# Patient Record
Sex: Female | Born: 1935 | Race: White | Hispanic: No | Smoking: Never smoker
Health system: Southern US, Community
[De-identification: ages and names within clinical notes are randomized; demographics above are authoritative.]

## PROBLEM LIST (undated history)

## (undated) DIAGNOSIS — F339 Major depressive disorder, recurrent, unspecified: Secondary | ICD-10-CM

## (undated) DIAGNOSIS — I8291 Chronic embolism and thrombosis of unspecified vein: Secondary | ICD-10-CM

## (undated) DIAGNOSIS — I10 Essential (primary) hypertension: Secondary | ICD-10-CM

## (undated) DIAGNOSIS — I69991 Dysphagia following unspecified cerebrovascular disease: Secondary | ICD-10-CM

## (undated) DIAGNOSIS — N1330 Unspecified hydronephrosis: Secondary | ICD-10-CM

## (undated) DIAGNOSIS — I639 Cerebral infarction, unspecified: Secondary | ICD-10-CM

## (undated) DIAGNOSIS — H3530 Unspecified macular degeneration: Secondary | ICD-10-CM

## (undated) DIAGNOSIS — D059 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: Secondary | ICD-10-CM

## (undated) DIAGNOSIS — I69920 Aphasia following unspecified cerebrovascular disease: Secondary | ICD-10-CM

## (undated) DIAGNOSIS — F329 Major depressive disorder, single episode, unspecified: Secondary | ICD-10-CM

## (undated) DIAGNOSIS — I629 Nontraumatic intracranial hemorrhage, unspecified: Secondary | ICD-10-CM

## (undated) DIAGNOSIS — F32A Depression, unspecified: Secondary | ICD-10-CM

## (undated) DIAGNOSIS — G9340 Encephalopathy, unspecified: Secondary | ICD-10-CM

## (undated) DIAGNOSIS — K760 Fatty (change of) liver, not elsewhere classified: Secondary | ICD-10-CM

## (undated) DIAGNOSIS — M792 Neuralgia and neuritis, unspecified: Secondary | ICD-10-CM

## (undated) DIAGNOSIS — G4700 Insomnia, unspecified: Secondary | ICD-10-CM

## (undated) HISTORY — DX: Depression, unspecified: F32.A

## (undated) HISTORY — DX: Neuralgia and neuritis, unspecified: M79.2

## (undated) HISTORY — DX: Major depressive disorder, single episode, unspecified: F32.9

## (undated) HISTORY — DX: Unspecified macular degeneration: H35.30

## (undated) HISTORY — DX: Dysphagia following unspecified cerebrovascular disease: I69.991

## (undated) HISTORY — PX: MASTECTOMY: SHX3

## (undated) HISTORY — DX: Major depressive disorder, recurrent, unspecified: F33.9

## (undated) HISTORY — PX: APPENDECTOMY: SHX54

## (undated) HISTORY — DX: Chronic embolism and thrombosis of unspecified vein: I82.91

## (undated) HISTORY — DX: Aphasia following unspecified cerebrovascular disease: I69.920

## (undated) HISTORY — PX: ABDOMINAL HYSTERECTOMY: SHX81

## (undated) HISTORY — PX: BREAST SURGERY: SHX581

## (undated) HISTORY — DX: Insomnia, unspecified: G47.00

## (undated) HISTORY — DX: Essential (primary) hypertension: I10

## (undated) HISTORY — DX: Nontraumatic intracranial hemorrhage, unspecified: I62.9

## (undated) HISTORY — DX: 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: D05.90

---

## 2012-03-24 ENCOUNTER — Non-Acute Institutional Stay (SKILLED_NURSING_FACILITY): Payer: Medicare Other | Admitting: Nurse Practitioner

## 2012-03-24 DIAGNOSIS — I6350 Cerebral infarction due to unspecified occlusion or stenosis of unspecified cerebral artery: Secondary | ICD-10-CM

## 2012-03-24 DIAGNOSIS — I10 Essential (primary) hypertension: Secondary | ICD-10-CM

## 2012-03-24 DIAGNOSIS — F3289 Other specified depressive episodes: Secondary | ICD-10-CM

## 2012-03-24 DIAGNOSIS — F329 Major depressive disorder, single episode, unspecified: Secondary | ICD-10-CM

## 2012-03-24 DIAGNOSIS — R130 Aphagia: Secondary | ICD-10-CM | POA: Insufficient documentation

## 2012-03-24 DIAGNOSIS — I82409 Acute embolism and thrombosis of unspecified deep veins of unspecified lower extremity: Secondary | ICD-10-CM

## 2012-03-24 DIAGNOSIS — G4700 Insomnia, unspecified: Secondary | ICD-10-CM | POA: Insufficient documentation

## 2012-03-24 DIAGNOSIS — M810 Age-related osteoporosis without current pathological fracture: Secondary | ICD-10-CM | POA: Insufficient documentation

## 2012-03-24 DIAGNOSIS — E785 Hyperlipidemia, unspecified: Secondary | ICD-10-CM

## 2012-03-24 DIAGNOSIS — F32A Depression, unspecified: Secondary | ICD-10-CM | POA: Insufficient documentation

## 2012-03-24 DIAGNOSIS — H3530 Unspecified macular degeneration: Secondary | ICD-10-CM

## 2012-03-24 DIAGNOSIS — R1310 Dysphagia, unspecified: Secondary | ICD-10-CM

## 2012-03-24 DIAGNOSIS — I639 Cerebral infarction, unspecified: Secondary | ICD-10-CM

## 2012-03-24 DIAGNOSIS — Z86718 Personal history of other venous thrombosis and embolism: Secondary | ICD-10-CM | POA: Insufficient documentation

## 2012-03-24 DIAGNOSIS — G47419 Narcolepsy without cataplexy: Secondary | ICD-10-CM

## 2012-03-24 DIAGNOSIS — R4701 Aphasia: Secondary | ICD-10-CM | POA: Insufficient documentation

## 2012-03-24 DIAGNOSIS — R339 Retention of urine, unspecified: Secondary | ICD-10-CM

## 2012-03-24 NOTE — Progress Notes (Signed)
Subjective:     Patient ID: Erica [name], female   DOB: 03/08/1927, 79 y.o.   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  Narcolepsy - Ritalin 5mg  BID Hyperlipidemia - Take lipitor 10mg  every night DVT - left lower leg, continue coumadin for total 3 months, onset 02/01/2012   Review of Systems  Constitutional: Positive for fatigue.  HENT: Positive for trouble swallowing and voice change. Negative for congestion and dental problem.   Respiratory: Positive for choking. Negative for wheezing.   Cardiovascular: Negative for leg swelling.  Gastrointestinal: Negative for nausea, constipation and abdominal distention.  Genitourinary: Positive for frequency and difficulty urinating.  Musculoskeletal: Positive for myalgias, back pain and gait problem.  Skin: Negative.   Neurological: Positive for speech difficulty and weakness. Negative for facial asymmetry.  Psychiatric/Behavioral: Positive for confusion and sleep disturbance.       Objective:   Physical Exam  Constitutional: She appears well-developed.  HENT:  Head: Normocephalic and atraumatic.  Left Ear: External ear normal.  Mouth/Throat: Oropharynx is clear and moist. No oropharyngeal exudate.  Eyes: Conjunctivae and EOM are normal. Pupils are equal, round, and reactive to light.  Neck: Normal range of motion. Neck supple. No JVD present. No thyromegaly present.  Cardiovascular: Normal rate, regular rhythm and normal heart sounds.  Exam reveals no friction rub.   No murmur heard. Pulmonary/Chest: Effort normal and breath sounds normal. No respiratory distress. She has no wheezes. She has no rales.  Abdominal: Soft. Bowel  sounds are normal. She exhibits no distension. There is no tenderness. There is no rebound and no guarding.  Genitourinary: Vagina normal.  Musculoskeletal: She exhibits no edema and no tenderness.       Right shoulder: She exhibits decreased strength.  Right sided paralysis with muscle strength 3/5 RUE and 2/5 ULE  Lymphadenopathy:    She has no cervical adenopathy.  Neurological: She is alert. She displays abnormal reflex. No cranial nerve deficit. She exhibits abnormal muscle tone. Coordination abnormal.  Skin: Skin is warm and dry. No rash noted.       Assessment:     Hypertenion - controlled Urinary Retention - stable CVA - onset 11/2011, residual left sided paralysis with dysphagia and aphasia Insomnia - managed  Depression - takes zoloft 75mg  daily and flat affact still present Narcolepsy - Not apparent to clinically Hyperlipidemia - controlled DVT - left lower leg, continue coumadin for total 3 months, onset 02/01/2012     Plan:     Hypertenion - continue linsinopril, check CMP Urinary Retention - continue urecholine and vesicare, check CBC CVA - onset 11/2011, supportive care in nursing home facility Insomnia - continue with mirtazapine 375 per night and Zaleplon 10mg  as PRN Depression - continue with zoloft 75mg  daily and flat affact presented at today's visit. Check TSH Narcolepsy - continue with Ritalin 5mg  BID for now Hyperlipidemia - continue with lipitor 10mg  every night DVT - left lower leg, continue coumadin for total 3 months, onset 02/01/2012

## 2012-04-06 LAB — HEPATIC FUNCTION PANEL
ALT: 13 U/L (ref 7–35)
AST: 16 U/L (ref 13–35)
Alkaline Phosphatase: 58 U/L (ref 25–125)

## 2012-04-06 LAB — BASIC METABOLIC PANEL
Creatinine: 0.8 mg/dL (ref 0.5–1.1)
Potassium: 2.9 mmol/L — AB (ref 3.4–5.3)
Potassium: 3.9 mmol/L (ref 3.4–5.3)
Sodium: 138 mmol/L (ref 137–147)

## 2012-04-06 LAB — CBC AND DIFFERENTIAL: Hemoglobin: 13.6 g/dL (ref 12.0–16.0)

## 2012-05-10 ENCOUNTER — Non-Acute Institutional Stay (SKILLED_NURSING_FACILITY): Payer: Medicare Other | Admitting: Nurse Practitioner

## 2012-05-10 DIAGNOSIS — R4701 Aphasia: Secondary | ICD-10-CM

## 2012-05-10 DIAGNOSIS — K140 Glossitis: Secondary | ICD-10-CM

## 2012-05-10 DIAGNOSIS — R635 Abnormal weight gain: Secondary | ICD-10-CM

## 2012-05-10 DIAGNOSIS — F329 Major depressive disorder, single episode, unspecified: Secondary | ICD-10-CM

## 2012-05-10 DIAGNOSIS — I82402 Acute embolism and thrombosis of unspecified deep veins of left lower extremity: Secondary | ICD-10-CM

## 2012-05-10 DIAGNOSIS — I10 Essential (primary) hypertension: Secondary | ICD-10-CM

## 2012-05-10 DIAGNOSIS — G47419 Narcolepsy without cataplexy: Secondary | ICD-10-CM

## 2012-05-10 DIAGNOSIS — R339 Retention of urine, unspecified: Secondary | ICD-10-CM

## 2012-05-10 DIAGNOSIS — G4700 Insomnia, unspecified: Secondary | ICD-10-CM

## 2012-05-10 DIAGNOSIS — I82409 Acute embolism and thrombosis of unspecified deep veins of unspecified lower extremity: Secondary | ICD-10-CM

## 2012-05-10 NOTE — Assessment & Plan Note (Signed)
#   14 Ibs weight gain in the past month--weekly weight to monitor.

## 2012-05-10 NOTE — Assessment & Plan Note (Signed)
Will stop Coumadin

## 2012-05-10 NOTE — Assessment & Plan Note (Signed)
Stable on Ritalin 5mg  bid.

## 2012-05-10 NOTE — Assessment & Plan Note (Signed)
Controlled on Lisinopril 5 mg daily  

## 2012-05-10 NOTE — Assessment & Plan Note (Signed)
Expressive aphasia--difficulty finding words related to hx of CVA

## 2012-05-10 NOTE — Assessment & Plan Note (Signed)
Erythema and sore front 1/3  and thick white coating over the posterior 2/3 of her tongue. Magic mouth wash 5cc ac and hs SS for 2 weeks.

## 2012-05-10 NOTE — Assessment & Plan Note (Signed)
Stable on Sertraline 75mg 

## 2012-05-10 NOTE — Assessment & Plan Note (Signed)
Stable on Urecholine 5mg tid   

## 2012-05-10 NOTE — Progress Notes (Signed)
Patient ID: Erica [name], female   DOB: 07-19-1927, 79 y.o.   MRN: [id number]  Chief Complaint:  Chief Complaint  Patient presents with  . Medical Managment of Chronic Issues    sore tongue, weight gain     HPI:    Problem List Items Addressed This Visit     ICD-9-CM   Essential hypertension, benign     Controlled on Lisinopril 5mg  daily    Urinary retention     Stable on Urecholine 5mg  tid    Depression     Stable on Sertraline 75mg     DVT (deep venous thrombosis)     Will stop Coumadin     Aphasia     Expressive aphasia--difficulty finding words related to hx of CVA    Insomnia     Still c/o difficulty falling asleep and early am awakes, but she expressed she feels rested next day. Takes Remeron 7.5mg  for rest and appetite and Zaleplon 10mg  prn at night.     Narcolepsy     Stable on Ritalin 5mg  bid.     Weight gain     # 14 Ibs weight gain in the past month--weekly weight to monitor.     Glossitis - Primary (Chronic)     Erythema and sore front 1/3  and thick white coating over the posterior 2/3 of her tongue. Magic mouth wash 5cc ac and hs SS for 2 weeks.        Review of Systems:  Review of Systems  Constitutional: Positive for weight loss (weight gain ). Negative for fever, chills, malaise/fatigue and diaphoresis.  HENT: Positive for hearing loss. Negative for congestion, sore throat, neck pain and ear discharge.        C/o sore tongue  Eyes: Negative for pain, discharge and redness.  Respiratory: Negative for cough, sputum production and wheezing.   Cardiovascular: Negative for chest pain, palpitations, orthopnea, claudication, leg swelling and PND.  Gastrointestinal: Negative for heartburn, nausea, vomiting, abdominal pain, diarrhea, constipation and blood in stool.  Genitourinary: Positive for frequency. Negative for dysuria, urgency, hematuria and flank pain.  Musculoskeletal: Negative for myalgias, back pain, joint pain and falls.  Skin: Negative for  itching and rash.  Neurological: Positive for speech change (expressive aphasia). Negative for dizziness, tingling, tremors, sensory change, focal weakness, seizures, loss of consciousness, weakness and headaches.  Endo/Heme/Allergies: Negative for environmental allergies and polydipsia. Does not bruise/bleed easily.  Psychiatric/Behavioral: Positive for memory loss. Negative for depression and hallucinations. The patient has insomnia. The patient is not nervous/anxious.      Medications: Patient's Medications  New Prescriptions   No medications on file  Previous Medications   ATORVASTATIN (LIPITOR) 10 MG TABLET    Take 10 mg by mouth daily.   BETA CAROTENE W/MINERALS (OCUVITE) TABLET    Take 1 tablet by mouth daily.   BETHANECHOL (URECHOLINE) 5 MG TABLET    Take 5 mg by mouth 3 (three) times daily.   CALCIUM CARBONATE (OS-CAL) 600 MG TABS    Take 600 mg by mouth 2 (two) times daily with a meal.   HYDROMORPHONE (DILAUDID) 2 MG TABLET    Take 2 mg by mouth every 6 (six) hours as needed for pain.   LISINOPRIL (PRINIVIL,ZESTRIL) 5 MG TABLET    Take 5 mg by mouth daily.   METHYLPHENIDATE (RITALIN) 5 MG TABLET    Take 5 mg by mouth 2 (two) times daily.   MIRTAZAPINE (REMERON) 7.5 MG TABLET    Take 7.5 mg by  mouth at bedtime and may repeat dose one time if needed.   SERTRALINE (ZOLOFT) 25 MG TABLET    Take 75 mg by mouth daily.   SOLIFENACIN (VESICARE) 5 MG TABLET    Take 10 mg by mouth 2 (two) times daily.   ZALEPLON (SONATA) 10 MG CAPSULE    Take 10 mg by mouth at bedtime as needed.  Modified Medications   No medications on file  Discontinued Medications   No medications on file     Physical Exam: Physical Exam  Constitutional: She appears well-developed.  HENT:  Head: Normocephalic and atraumatic.  Left Ear: External ear normal.  Mouth/Throat: Oropharynx is clear and moist. No oropharyngeal exudate.  Anterior 1/3 of tongue erythematous and posterior 2/3 of tongue covered with white  thick coating  Eyes: Conjunctivae and EOM are normal. Pupils are equal, round, and reactive to light.  Neck: Normal range of motion. Neck supple. No JVD present. No thyromegaly present.  Cardiovascular: Normal rate, regular rhythm and normal heart sounds.  Exam reveals no friction rub.   No murmur heard. Pulmonary/Chest: Effort normal and breath sounds normal. No respiratory distress. She has no wheezes. She has no rales.  Abdominal: Soft. Bowel sounds are normal. She exhibits no distension. There is no tenderness. There is no rebound and no guarding.  Genitourinary: Vagina normal.  Musculoskeletal: She exhibits no edema and no tenderness.       Right shoulder: She exhibits decreased strength.  Right sided paralysis with muscle strength 4/5  Lymphadenopathy:    She has no cervical adenopathy.  Neurological: She is alert. She displays abnormal reflex. No cranial nerve deficit. She exhibits abnormal muscle tone. Coordination abnormal.  Skin: Skin is warm and dry. No rash noted.  Psychiatric: Her mood appears not anxious. Her affect is not angry, not blunt, not labile and not inappropriate. Her speech is delayed and tangential. Her speech is not rapid and/or pressured and not slurred. She is slowed. She is not agitated, not aggressive, not hyperactive, not withdrawn, not actively hallucinating and not combative. Thought content is not paranoid and not delusional. Cognition and memory are impaired. She does not express impulsivity or inappropriate judgment. She does not exhibit a depressed mood. She is communicative. She exhibits abnormal recent memory. She exhibits normal remote memory. She is attentive.     Filed Vitals:   05/10/12 1406  BP: 110/62  Pulse: 72  Temp: 97.7 F (36.5 C)  TempSrc: Tympanic  Resp: 16      Labs reviewed: Basic Metabolic Panel:  Recent Labs  16/10/96  NA 138  K 2.9*  BUN 14  CREATININE 0.8  TSH 1.47    Liver Function Tests:  Recent Labs  04/06/12   AST 16  ALT 13  ALKPHOS 58    CBC:  Recent Labs  04/06/12  WBC 4.6  HGB 13.6  HCT 40  PLT 204    Anemia Panel: No results found for this basename: IRON, FOLATE, VITAMINB12,  in the last 8760 hours  Significant Diagnostic Results:     Assessment/Plan Glossitis Erythema and sore front 1/3  and thick white coating over the posterior 2/3 of her tongue. Magic mouth wash 5cc ac and hs SS for 2 weeks.   Aphasia Expressive aphasia--difficulty finding words related to hx of CVA  Insomnia Still c/o difficulty falling asleep and early am awakes, but she expressed she feels rested next day. Takes Remeron 7.5mg  for rest and appetite and Zaleplon 10mg  prn at night.  Narcolepsy Stable on Ritalin 5mg  bid.   Essential hypertension, benign Controlled on Lisinopril 5mg  daily  Depression Stable on Sertraline 75mg   DVT (deep venous thrombosis) Will stop Coumadin   Urinary retention Stable on Urecholine 5mg  tid  Weight gain # 14 Ibs weight gain in the past month--weekly weight to monitor.       Family/ staff Communication: monitor the patient' sore tongue   Goals of care: SNF   Labs/tests ordered none

## 2012-05-10 NOTE — Assessment & Plan Note (Addendum)
Still c/o difficulty falling asleep and early am awakes, but she expressed she feels rested next day. Takes Remeron 7.5mg  for rest and appetite and Zaleplon 10mg  prn at night.

## 2012-05-14 LAB — CBC AND DIFFERENTIAL: WBC: 4.9 10^3/mL

## 2012-06-14 ENCOUNTER — Non-Acute Institutional Stay (SKILLED_NURSING_FACILITY): Payer: Medicare Other | Admitting: Nurse Practitioner

## 2012-06-14 DIAGNOSIS — I10 Essential (primary) hypertension: Secondary | ICD-10-CM

## 2012-06-14 DIAGNOSIS — Z66 Do not resuscitate: Secondary | ICD-10-CM

## 2012-06-14 DIAGNOSIS — E785 Hyperlipidemia, unspecified: Secondary | ICD-10-CM

## 2012-06-14 DIAGNOSIS — I6350 Cerebral infarction due to unspecified occlusion or stenosis of unspecified cerebral artery: Secondary | ICD-10-CM

## 2012-06-14 DIAGNOSIS — I82402 Acute embolism and thrombosis of unspecified deep veins of left lower extremity: Secondary | ICD-10-CM

## 2012-06-14 DIAGNOSIS — N318 Other neuromuscular dysfunction of bladder: Secondary | ICD-10-CM

## 2012-06-14 DIAGNOSIS — R339 Retention of urine, unspecified: Secondary | ICD-10-CM

## 2012-06-14 DIAGNOSIS — I82409 Acute embolism and thrombosis of unspecified deep veins of unspecified lower extremity: Secondary | ICD-10-CM

## 2012-06-14 DIAGNOSIS — F329 Major depressive disorder, single episode, unspecified: Secondary | ICD-10-CM

## 2012-06-14 DIAGNOSIS — G4700 Insomnia, unspecified: Secondary | ICD-10-CM

## 2012-06-14 DIAGNOSIS — I639 Cerebral infarction, unspecified: Secondary | ICD-10-CM

## 2012-06-14 DIAGNOSIS — D6859 Other primary thrombophilia: Secondary | ICD-10-CM

## 2012-06-14 NOTE — Assessment & Plan Note (Signed)
Still c/o difficulty falling asleep and early am awakes, but she expressed she feels rested next day--better. Takes Remeron 7.5mg  for rest and appetite and Zaleplon 10mg  prn at night since Ritalin was decreased to 5mg  daily.

## 2012-06-14 NOTE — Assessment & Plan Note (Signed)
Controlled on Lisinopril 5 mg daily  

## 2012-06-14 NOTE — Progress Notes (Signed)
Patient ID: Erica [name], female   DOB: [birth year], 79 y.o.   MRN: [id number]  Chief Complaint:  Chief Complaint  Patient presents with  . 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 Left hand grip strength is weaker--cannot make a tight fist even if muscle strength is 5/5.     Insomnia     Still c/o difficulty falling asleep and early am awakes, but she expressed she feels rested next day--better. Takes Remeron 7.5mg  for rest and appetite and Zaleplon 10mg  prn at night since Ritalin was decreased to 5mg  daily.       Other and unspecified hyperlipidemia     Takes Lipitor 10mg      Protein C deficiency     Protein C functional 21(75-133) and total 60(72-160)--will be on long term--anticoagulation therapy--Xarelto 20mg  daily started 05/20/12    Protein S deficiency     Protein S functional 28(69-129), total 43(60-150)--Coumadin discontinued and Xarelto 20mg  started.     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        Review of Systems:  Review of Systems  Constitutional: Positive for weight loss (weight gain ). Negative for fever, chills, malaise/fatigue and diaphoresis.  HENT: Positive for hearing loss. Negative for congestion, sore throat, neck pain and ear discharge.        C/o sore tongue  Eyes: Negative for pain, discharge and redness.  Respiratory: Negative for cough, sputum production and wheezing.   Cardiovascular: Negative for chest pain, palpitations, orthopnea, claudication, leg swelling and PND.   Gastrointestinal: Negative for heartburn, nausea, vomiting, abdominal pain, diarrhea, constipation and blood in stool.  Genitourinary: Positive for frequency. Negative for dysuria, urgency, hematuria and flank pain.  Musculoskeletal: Negative for myalgias, back pain, joint pain and falls.  Skin: Negative for itching and rash.  Neurological: Positive for speech change (expressive aphasia). Negative for dizziness, tingling, tremors, sensory change, focal weakness, seizures, loss of consciousness, weakness and headaches.  Endo/Heme/Allergies: Negative for environmental allergies and polydipsia. Does not bruise/bleed easily.  Psychiatric/Behavioral: Positive for memory loss. Negative for depression and hallucinations. The patient is not nervous/anxious. Insomnia: better.      Medications: Patient's Medications  New Prescriptions   No medications on file  Previous Medications   ATORVASTATIN (LIPITOR) 10 MG TABLET    Take 10 mg by mouth daily.   BETA CAROTENE W/MINERALS (OCUVITE) TABLET    Take 1 tablet by mouth daily.   BETHANECHOL (URECHOLINE) 5 MG TABLET    Take 5 mg by mouth 3 (three) times daily.   CALCIUM CARBONATE (OS-CAL) 600 MG TABS    Take 600 mg by mouth 2 (two) times daily with a meal.   HYDROMORPHONE (DILAUDID) 2 MG TABLET    Take 2 mg by mouth every 6 (six) hours as needed for pain.   LISINOPRIL (PRINIVIL,ZESTRIL) 5 MG TABLET    Take 5 mg by mouth daily.  METHYLPHENIDATE (RITALIN) 5 MG TABLET    Take 5 mg by mouth 2 (two) times daily.   MIRTAZAPINE (REMERON) 7.5 MG TABLET    Take 7.5 mg by mouth at bedtime and may repeat dose one time if needed.   SERTRALINE (ZOLOFT) 25 MG TABLET    Take 75 mg by mouth daily.   SOLIFENACIN (VESICARE) 5 MG TABLET    Take 10 mg by mouth 2 (two) times daily.   ZALEPLON (SONATA) 10 MG CAPSULE    Take 10 mg by mouth at bedtime as needed.  Modified Medications   No medications on file  Discontinued Medications   No medications on file      Physical Exam: Physical Exam  Constitutional: She appears well-developed.  HENT:  Head: Normocephalic and atraumatic.  Left Ear: External ear normal.  Mouth/Throat: Oropharynx is clear and moist. No oropharyngeal exudate.  Anterior 1/3 of tongue erythematous and posterior 2/3 of tongue covered with white thick coating  Eyes: Conjunctivae and EOM are normal. Pupils are equal, round, and reactive to light.  Neck: Normal range of motion. Neck supple. No JVD present. No thyromegaly present.  Cardiovascular: Normal rate, regular rhythm and normal heart sounds.  Exam reveals no friction rub.   No murmur heard. Pulmonary/Chest: Effort normal and breath sounds normal. No respiratory distress. She has no wheezes. She has no rales.  Abdominal: Soft. Bowel sounds are normal. She exhibits no distension. There is no tenderness. There is no rebound and no guarding.  Genitourinary: Vagina normal.  Musculoskeletal: She exhibits no edema and no tenderness.       Right shoulder: She exhibits decreased strength.  Right sided paralysis with muscle strength 4/5  Lymphadenopathy:    She has no cervical adenopathy.  Neurological: She is alert. She displays abnormal reflex. No cranial nerve deficit. She exhibits abnormal muscle tone. Coordination abnormal.  Skin: Skin is warm and dry. No rash noted.  Psychiatric: Her mood appears not anxious. Her affect is not angry, not blunt, not labile and not inappropriate. Her speech is delayed and tangential. Her speech is not rapid and/or pressured and not slurred. She is slowed. She is not agitated, not aggressive, not hyperactive, not withdrawn, not actively hallucinating and not combative. Thought content is not paranoid and not delusional. Cognition and memory are impaired. She does not express impulsivity or inappropriate judgment. She does not exhibit a depressed mood. She is communicative. She exhibits abnormal recent memory. She exhibits normal remote memory.  She is attentive.     Filed Vitals:   06/14/12 1626  BP: 138/68  Pulse: 72  Temp: 97.3 F (36.3 C)  TempSrc: Tympanic  Resp: 18      Labs reviewed: Basic Metabolic Panel:  Recent Labs  16/10/96  NA 138  K 2.9*  BUN 14  CREATININE 0.8  TSH 1.47    Liver Function Tests:  Recent Labs  04/06/12  AST 16  ALT 13  ALKPHOS 58    CBC:  Recent Labs  04/06/12 05/14/12  WBC 4.6 4.9  HGB 13.6 13.9  HCT 40 41  PLT 204 210    Anemia Panel: No results found for this basename: IRON, FOLATE, VITAMINB12,  in the last 8760 hours  Significant Diagnostic Results:     Assessment/Plan CVA (cerebral vascular accident) Left parietal and occipital hemorrhagic stroke 11/2011. Left hand grip strength is weaker--cannot make a tight fist even if muscle strength is 5/5.   Protein C deficiency Protein C functional [phone]) and total  60(72-160)--will be on long term--anticoagulation therapy--Xarelto 20mg  daily started 05/20/12  Protein S deficiency Protein S functional 28(69-129), total 43(60-150)--Coumadin discontinued and Xarelto 20mg  started.   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   Essential hypertension, benign Controlled on Lisinopril 5mg  daily    Depression Stable on Sertraline 75mg      Urinary retention Stable on Urecholine 5mg  tid    Insomnia Still c/o difficulty falling asleep and early am awakes, but she expressed she feels rested next day--better. Takes Remeron 7.5mg  for rest and appetite and Zaleplon 10mg  prn at night since Ritalin was decreased to 5mg  daily.     Other and unspecified hyperlipidemia Takes Lipitor 10mg       Family/ staff Communication: may be trial of AL   Goals of care: AL   Labs/tests ordered none.

## 2012-06-14 NOTE — Assessment & Plan Note (Signed)
Protein C functional [id number]) and total 60(72-160)--will be on long term--anticoagulation therapy--Xarelto 20mg  daily started 05/20/12

## 2012-06-14 NOTE — Assessment & Plan Note (Signed)
Stable on Urecholine 5mg  tid

## 2012-06-14 NOTE — Assessment & Plan Note (Signed)
Stable on Oxybutynin 10mg daily               

## 2012-06-14 NOTE — Assessment & Plan Note (Signed)
Stable on Sertraline 75mg 

## 2012-06-14 NOTE — Assessment & Plan Note (Signed)
Takes Lipitor 10mg    

## 2012-06-14 NOTE — Assessment & Plan Note (Signed)
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 Left hand grip strength is weaker--cannot make a tight fist even if muscle strength is 5/5.

## 2012-06-14 NOTE — Assessment & Plan Note (Signed)
Protein S functional 28(69-129), total 43(60-150)--Coumadin discontinued and Xarelto 20mg started.     

## 2012-07-14 ENCOUNTER — Non-Acute Institutional Stay (SKILLED_NURSING_FACILITY): Payer: Medicare Other | Admitting: Nurse Practitioner

## 2012-07-14 DIAGNOSIS — I10 Essential (primary) hypertension: Secondary | ICD-10-CM

## 2012-07-14 DIAGNOSIS — D6859 Other primary thrombophilia: Secondary | ICD-10-CM

## 2012-07-14 DIAGNOSIS — I639 Cerebral infarction, unspecified: Secondary | ICD-10-CM

## 2012-07-14 DIAGNOSIS — I6350 Cerebral infarction due to unspecified occlusion or stenosis of unspecified cerebral artery: Secondary | ICD-10-CM

## 2012-07-14 DIAGNOSIS — K140 Glossitis: Secondary | ICD-10-CM

## 2012-07-14 DIAGNOSIS — F329 Major depressive disorder, single episode, unspecified: Secondary | ICD-10-CM

## 2012-07-14 DIAGNOSIS — N318 Other neuromuscular dysfunction of bladder: Secondary | ICD-10-CM

## 2012-07-14 NOTE — Assessment & Plan Note (Signed)
Stable on Sertraline 75mg, Mirtazapine 7.5mg(sleeps well at night and weight has been stabilized), Ritalin down to 5mg daily now.     

## 2012-07-14 NOTE — Assessment & Plan Note (Addendum)
No erythema, while/yellow coating, no lesion noted, Magic mouth wash 5cc ac and hs SS helped in the past--may resume and use as prn in future if helps.

## 2012-07-14 NOTE — Progress Notes (Signed)
Patient ID: Erica [name], female   DOB: Jul 19, 1927, 79 y.o.   MRN: [id number]  Chief Complaint:  Chief Complaint  Patient presents with  . Medical Managment of Chronic Issues     HPI:   Problem List Items Addressed This Visit   CVA (cerebral vascular accident)     Left parietal and occipital hemorrhagic stroke 11/2011. Right  hand grip strength is weaker--cannot make a tight fist even if muscle strength is 5/5. Expressive aphasia persisted.       Depression     Stable on Sertraline 75mg , Mirtazapine 7.5mg (sleeps well at night and weight has been stabilized), Ritalin down to 5mg  daily now.         Essential hypertension, benign     Controlled on Lisinopril 5mg  daily        Glossitis - Primary (Chronic)     No erythema, while/yellow coating, no lesion noted, Magic mouth wash 5cc ac and hs SS helped in the past--may resume and use as prn in future if helps.       Hypertonicity of bladder     Stable on Oxybutynin 10mg  daily.       Protein C deficiency     Protein C functional 21(75-133) and total 60(72-160)- on long term--anticoagulation therapy--Xarelto 20mg  daily started 05/20/12         Medications: Reviewed at [facility]   Physical Exam: Physical Exam  Constitutional: She appears well-developed.  HENT:  Head: Normocephalic and atraumatic.  Left Ear: External ear normal.  Mouth/Throat: Oropharynx is clear and moist. Mucous membranes are not pale, not dry and not cyanotic. No oropharyngeal exudate.  Anterior 1/3 of tongue erythematous and posterior 2/3 of tongue covered with white thick coating  Eyes: Conjunctivae and EOM are normal. Pupils are equal, round, and reactive to light.  Neck: Normal range of motion. Neck supple. No JVD present. No thyromegaly present.  Cardiovascular: Normal rate, regular rhythm and normal heart sounds.  Exam reveals no friction rub.   No murmur heard. Pulmonary/Chest: Effort normal and breath sounds normal. No respiratory distress.  She has no wheezes. She has no rales.  Abdominal: Soft. Bowel sounds are normal. She exhibits no distension. There is no tenderness. There is no rebound and no guarding.  Genitourinary: Vagina normal.  Musculoskeletal: She exhibits no edema and no tenderness.       Right shoulder: She exhibits decreased strength.  Right sided paralysis with muscle strength 4/5  Lymphadenopathy:    She has no cervical adenopathy.  Neurological: She is alert. She displays abnormal reflex. No cranial nerve deficit. She exhibits abnormal muscle tone. Coordination abnormal.  Skin: Skin is warm and dry. No rash noted.  Psychiatric: Her mood appears not anxious. Her affect is not angry, not blunt, not labile and not inappropriate. Her speech is delayed and tangential. Her speech is not rapid and/or pressured and not slurred. She is slowed. She is not agitated, not aggressive, not hyperactive, not withdrawn, not actively hallucinating and not combative. Thought content is not paranoid and not delusional. Cognition and memory are impaired. She does not express impulsivity or inappropriate judgment. She does not exhibit a depressed mood. She is communicative. She exhibits abnormal recent memory. She exhibits normal remote memory. She is attentive.    Review of Systems  Constitutional: Negative for fever, chills, weight loss (weight gain ), malaise/fatigue and diaphoresis.  HENT: Positive for hearing loss. Negative for congestion, sore throat, neck pain and ear discharge.        C/o  sore tongue  Eyes: Negative for pain, discharge and redness.  Respiratory: Negative for cough, sputum production and wheezing.   Cardiovascular: Negative for chest pain, palpitations, orthopnea, claudication, leg swelling and PND.  Gastrointestinal: Negative for heartburn, nausea, vomiting, abdominal pain, diarrhea, constipation and blood in stool.  Genitourinary: Positive for frequency. Negative for dysuria, urgency, hematuria and flank pain.   Musculoskeletal: Negative for myalgias, back pain, joint pain and falls.  Skin: Negative for itching and rash.  Neurological: Positive for speech change (expressive aphasia) and focal weakness (right hand is weaker than the left even if her grip strength is 5/5). Negative for dizziness, tingling, tremors, sensory change, seizures, loss of consciousness, weakness and headaches.  Endo/Heme/Allergies: Negative for environmental allergies and polydipsia. Does not bruise/bleed easily.  Psychiatric/Behavioral: Positive for memory loss. Negative for depression and hallucinations. The patient is not nervous/anxious and does not have insomnia (better).      Filed Vitals:   07/14/12 1609  BP: 126/64  Pulse: 68  Temp: 97.8 F (36.6 C)  TempSrc: Tympanic  Resp: 16      Labs reviewed: Basic Metabolic Panel:  Recent Labs  16/10/96  NA 138  K 2.9*  BUN 14  CREATININE 0.8  TSH 1.47    Liver Function Tests:  Recent Labs  04/06/12  AST 16  ALT 13  ALKPHOS 58    CBC:  Recent Labs  04/06/12 05/14/12  WBC 4.6 4.9  HGB 13.6 13.9  HCT 40 41  PLT 204 210    Anemia Panel: No results found for this basename: IRON, FOLATE, VITAMINB12,  in the last 8760 hours  Significant Diagnostic Results:     Assessment/Plan Glossitis No erythema, while/yellow coating, no lesion noted, Magic mouth wash 5cc ac and hs SS helped in the past--may resume and use as prn in future if helps.     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     Essential hypertension, benign Controlled on Lisinopril 5mg  daily      Depression Stable on Sertraline 75mg , Mirtazapine 7.5mg (sleeps well at night and weight has been stabilized), Ritalin down to 5mg  daily now.       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 Right  hand grip strength is weaker--cannot make a tight fist even if muscle strength is 5/5. Expressive aphasia persisted.      Protein C deficiency Protein C functional 21(75-133) and total 60(72-160)- on long term--anticoagulation therapy--Xarelto 20mg  daily started 05/20/12        Family/ staff Communication: observe the patient.    Goals of care: SNF(failed trial of AL)   Labs/tests ordered none

## 2012-07-14 NOTE — Assessment & Plan Note (Signed)
Protein C functional [id number]) and total 60(72-160)- on long term--anticoagulation therapy--Xarelto 20mg  daily started 05/20/12

## 2012-07-14 NOTE — Assessment & Plan Note (Signed)
Controlled on Lisinopril 5 mg daily  

## 2012-07-14 NOTE — Assessment & Plan Note (Signed)
Stable on Oxybutynin 10mg daily               

## 2012-07-14 NOTE — Assessment & Plan Note (Signed)
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 Right  hand grip strength is weaker--cannot make a tight fist even if muscle strength is 5/5. Expressive aphasia persisted.   

## 2012-08-12 ENCOUNTER — Other Ambulatory Visit: Payer: Self-pay | Admitting: *Deleted

## 2012-08-12 MED ORDER — METHYLPHENIDATE HCL 5 MG PO TABS: ORAL_TABLET | ORAL | Status: DC

## 2012-08-23 ENCOUNTER — Non-Acute Institutional Stay (SKILLED_NURSING_FACILITY): Payer: Medicare Other | Admitting: Nurse Practitioner

## 2012-08-23 ENCOUNTER — Encounter: Payer: Self-pay | Admitting: Nurse Practitioner

## 2012-08-23 DIAGNOSIS — F329 Major depressive disorder, single episode, unspecified: Secondary | ICD-10-CM

## 2012-08-23 DIAGNOSIS — D6859 Other primary thrombophilia: Secondary | ICD-10-CM

## 2012-08-23 DIAGNOSIS — N644 Mastodynia: Secondary | ICD-10-CM

## 2012-08-23 DIAGNOSIS — I10 Essential (primary) hypertension: Secondary | ICD-10-CM

## 2012-08-23 DIAGNOSIS — F32A Depression, unspecified: Secondary | ICD-10-CM

## 2012-08-23 DIAGNOSIS — N318 Other neuromuscular dysfunction of bladder: Secondary | ICD-10-CM

## 2012-08-23 DIAGNOSIS — E785 Hyperlipidemia, unspecified: Secondary | ICD-10-CM

## 2012-08-23 NOTE — Assessment & Plan Note (Signed)
Takes Atorvastatin 10mg 

## 2012-08-23 NOTE — Assessment & Plan Note (Signed)
Protein S functional 28(69-129), total 43(60-150)--Coumadin discontinued and Xarelto 20mg started.     

## 2012-08-23 NOTE — Assessment & Plan Note (Signed)
Stable on Sertraline 75mg, Mirtazapine 7.5mg(sleeps well at night and weight has been stabilized), Ritalin down to 5mg daily now.     

## 2012-08-23 NOTE — Assessment & Plan Note (Signed)
Controlled on Lisinopril 5 mg daily  

## 2012-08-23 NOTE — Assessment & Plan Note (Signed)
Stable on Oxybutynin 10mg  daily and Bethanechol.

## 2012-08-23 NOTE — Progress Notes (Signed)
Patient ID: Erica [name], female   DOB: Nov 14, 1927, 79 y.o.   MRN: [id number] Code Status: DNR  Allergies  Allergen Reactions  . Actonel [Risedronate Sodium]   . Darvon [Propoxyphene Hcl]   . Hydrocodone Nausea And Vomiting  . Morphine And Related Nausea And Vomiting  . Oxycodone Hcl   . Pneumovax [Pneumococcal Polysaccharides]     Chief Complaint  Patient presents with  . Medical Managment of Chronic Issues    left breast tenderness    HPI: Patient is a 79 y.o. female seen in the SNF at [facility] today for evaluation of "left breast discomfort" and other chronic medical conditions.  Problem List Items Addressed This Visit   Depression     Stable on Sertraline 75mg , Mirtazapine 7.5mg (sleeps well at night and weight has been stabilized), Ritalin down to 5mg  daily now.           Essential hypertension, benign     Controlled on Lisinopril 5mg  daily          Hypertonicity of bladder     Stable on Oxybutynin 10mg  daily and Bethanechol.         Other and unspecified hyperlipidemia     Takes Atorvastatin 10mg     Pain of left breast - Primary     C/o left breast discomfort, no lump appreciated, unable to specify when or where--her Dtr POA said it was in gastric area after a meal-may try Prilosec 20mg  daily and Mammogram if no better.     Protein S deficiency     Protein S functional 28(69-129), total 43(60-150)--Coumadin discontinued and Xarelto 20mg  started.          Review of Systems:  Review of Systems  Constitutional: Negative for fever, chills, weight loss (weight gain ), malaise/fatigue and diaphoresis.  HENT: Positive for hearing loss. Negative for congestion, sore throat, neck pain and ear discharge.        C/o sore tongue-resolved.   Eyes: Negative for pain, discharge and redness.  Respiratory: Negative for cough, sputum production and wheezing.   Cardiovascular: Negative for chest pain, palpitations, orthopnea, claudication, leg swelling  and PND.  Gastrointestinal: Negative for heartburn, nausea, vomiting, abdominal pain, diarrhea, constipation and blood in stool.  Genitourinary: Positive for frequency. Negative for dysuria, urgency, hematuria and flank pain.       C/o left breast discomfort vs heart burn   Musculoskeletal: Negative for myalgias, back pain, joint pain and falls.  Skin: Negative for itching and rash.       The right mastectomy.   Neurological: Positive for speech change (expressive aphasia) and focal weakness (right hand is weaker than the left even if her grip strength is 5/5). Negative for dizziness, tingling, tremors, sensory change, seizures, loss of consciousness, weakness and headaches.  Endo/Heme/Allergies: Negative for environmental allergies and polydipsia. Does not bruise/bleed easily.  Psychiatric/Behavioral: Positive for memory loss. Negative for depression and hallucinations. The patient is not nervous/anxious and does not have insomnia (better).      Past Medical History  Diagnosis Date  . Hypertension   . Depression     Medications: Reviewed at [facility]   Physical Exam: Physical Exam  Constitutional: She appears well-developed.  HENT:  Head: Normocephalic and atraumatic.  Left Ear: External ear normal.  Mouth/Throat: Oropharynx is clear and moist. Mucous membranes are not pale, not dry and not cyanotic. No oropharyngeal exudate.  Anterior 1/3 of tongue erythematous and posterior 2/3 of tongue covered with white thick coating  Eyes: Conjunctivae and  EOM are normal. Pupils are equal, round, and reactive to light.  Neck: Normal range of motion. Neck supple. No JVD present. No thyromegaly present.  Cardiovascular: Normal rate, regular rhythm and normal heart sounds.  Exam reveals no friction rub.   No murmur heard. Pulmonary/Chest: Effort normal and breath sounds normal. No respiratory distress. She has no wheezes. She has no rales.  Abdominal: Soft. Bowel sounds are normal. She exhibits no  distension. There is no tenderness. There is no rebound and no guarding.  Genitourinary: Vagina normal.  Musculoskeletal: She exhibits no edema and no tenderness.       Right shoulder: She exhibits decreased strength.  Right sided paralysis with muscle strength 4/5  Lymphadenopathy:    She has no cervical adenopathy.  Neurological: She is alert. She displays abnormal reflex. No cranial nerve deficit. She exhibits abnormal muscle tone. Coordination abnormal.  Skin: Skin is warm and dry. No rash noted.  S/p right mastectomy  Psychiatric: Her mood appears not anxious. Her affect is not angry, not blunt, not labile and not inappropriate. Her speech is delayed and tangential. Her speech is not rapid and/or pressured and not slurred. She is slowed. She is not agitated, not aggressive, not hyperactive, not withdrawn, not actively hallucinating and not combative. Thought content is not paranoid and not delusional. Cognition and memory are impaired. She does not express impulsivity or inappropriate judgment. She does not exhibit a depressed mood. She is communicative. She exhibits abnormal recent memory. She exhibits normal remote memory. She is attentive.    Filed Vitals:   08/23/12 1803  BP: 126/64  Pulse: 68  Temp: 97.8 F (36.6 C)  TempSrc: Tympanic  Resp: 16      Labs reviewed: Basic Metabolic Panel:  Recent Labs  16/10/96  NA 138  K 2.9*  BUN 14  CREATININE 0.8  TSH 1.47   Liver Function Tests:  Recent Labs  04/06/12  AST 16  ALT 13  ALKPHOS 58   CBC:  Recent Labs  04/06/12 05/14/12  WBC 4.6 4.9  HGB 13.6 13.9  HCT 40 41  PLT 204 210   Assessment/Plan Pain of left breast C/o left breast discomfort, no lump appreciated, unable to specify when or where--her Dtr POA said it was in gastric area after a meal-may try Prilosec 20mg  daily and Mammogram if no better.   Hypertonicity of bladder Stable on Oxybutynin 10mg  daily and Bethanechol.        Depression Stable on Sertraline 75mg , Mirtazapine 7.5mg (sleeps well at night and weight has been stabilized), Ritalin down to 5mg  daily now.         Essential hypertension, benign Controlled on Lisinopril 5mg  daily        Protein S deficiency Protein S functional 28(69-129), total 43(60-150)--Coumadin discontinued and Xarelto 20mg  started.     Other and unspecified hyperlipidemia Takes Atorvastatin 10mg     Family/ Staff Communication: observe the patient  Goals of Care: SNF  Labs/tests ordered: none

## 2012-08-23 NOTE — Assessment & Plan Note (Signed)
C/o left breast discomfort, no lump appreciated, unable to specify when or where--her Dtr POA said it was in gastric area after a meal-may try Prilosec 20mg  daily and Mammogram if no better.

## 2012-09-29 ENCOUNTER — Encounter: Payer: Self-pay | Admitting: Nurse Practitioner

## 2012-09-29 ENCOUNTER — Non-Acute Institutional Stay (SKILLED_NURSING_FACILITY): Payer: Medicare Other | Admitting: Nurse Practitioner

## 2012-09-29 DIAGNOSIS — K219 Gastro-esophageal reflux disease without esophagitis: Secondary | ICD-10-CM | POA: Insufficient documentation

## 2012-09-29 DIAGNOSIS — R339 Retention of urine, unspecified: Secondary | ICD-10-CM

## 2012-09-29 DIAGNOSIS — G4700 Insomnia, unspecified: Secondary | ICD-10-CM

## 2012-09-29 DIAGNOSIS — N318 Other neuromuscular dysfunction of bladder: Secondary | ICD-10-CM

## 2012-09-29 DIAGNOSIS — D6859 Other primary thrombophilia: Secondary | ICD-10-CM

## 2012-09-29 DIAGNOSIS — I10 Essential (primary) hypertension: Secondary | ICD-10-CM

## 2012-09-29 DIAGNOSIS — F329 Major depressive disorder, single episode, unspecified: Secondary | ICD-10-CM

## 2012-09-29 NOTE — Assessment & Plan Note (Signed)
Stable on Oxybutynin 10mg daily               

## 2012-09-29 NOTE — Assessment & Plan Note (Signed)
Protein S functional 28(69-129), total 43(60-150)--Coumadin discontinued and Xarelto 20mg started.     

## 2012-09-29 NOTE — Assessment & Plan Note (Signed)
Stable since off Urecholine

## 2012-09-29 NOTE — Assessment & Plan Note (Signed)
Much improved on Sertraline 75mg,  Remeron 7.5mg for rest and appetite(weight stable). Off Zaleplon 10mg prn.       

## 2012-09-29 NOTE — Assessment & Plan Note (Signed)
Stable on Sertraline 75mg, Mirtazapine 7.5mg(sleeps well at night and weight has been stabilized), Ritalin down to 5mg daily now.     

## 2012-09-29 NOTE — Progress Notes (Signed)
Patient ID: Erica [name], female   DOB: 04-25-1927, 79 y.o.   MRN: [id number] Code Status: DNR  Allergies  Allergen Reactions  . Actonel [Risedronate Sodium]   . Darvon [Propoxyphene Hcl]   . Hydrocodone Nausea And Vomiting  . Morphine And Related Nausea And Vomiting  . Oxycodone Hcl   . Pneumovax [Pneumococcal Polysaccharides]     Chief Complaint  Patient presents with  . Medical Managment of Chronic Issues    GERD    HPI: Patient is a 79 y.o. female seen in the SNF at [facility] today for evaluation of her chronic medical conditions.  Problem List Items Addressed This Visit   Depression     Stable on Sertraline 75mg , Mirtazapine 7.5mg (sleeps well at night and weight has been stabilized), Ritalin down to 5mg  daily now.             Essential hypertension, benign     Controlled on Lisinopril 5mg  daily            Relevant Medications      Rivaroxaban (XARELTO) 20 MG TABS tablet   GERD (gastroesophageal reflux disease) - Primary     No further c/o heartburns since Urecholine was discontinued and since she is off Omeprazole since 09/22/12--dc Omeprazole and observe for return of targeted symptoms.      Hypertonicity of bladder     Stable on Oxybutynin 10mg  daily          Insomnia     Much improved on Sertraline 75mg ,  Remeron 7.5mg  for rest and appetite(weight stable). Off Zaleplon 10mg  prn.        Protein S deficiency     Protein S functional 28(69-129), total 43(60-150)--Coumadin discontinued and Xarelto 20mg  started.         Urinary retention     Stable since off Urecholine            Review of Systems:  Review of Systems  Constitutional: Negative for fever, chills, weight loss (weight gain ), malaise/fatigue and diaphoresis.  HENT: Positive for hearing loss. Negative for congestion, sore throat, neck pain and ear discharge.        C/o sore tongue-resolved.   Eyes: Negative for pain, discharge and redness.  Respiratory:  Negative for cough, sputum production and wheezing.   Cardiovascular: Negative for chest pain, palpitations, orthopnea, claudication, leg swelling and PND.  Gastrointestinal: Negative for heartburn, nausea, vomiting, abdominal pain, diarrhea, constipation and blood in stool.  Genitourinary: Positive for frequency. Negative for dysuria, urgency, hematuria and flank pain.       C/o left breast discomfort vs heart burn-resolved.   Musculoskeletal: Negative for myalgias, back pain, joint pain and falls.  Skin: Negative for itching and rash.       The right mastectomy.   Neurological: Positive for speech change (expressive aphasia) and focal weakness (right hand is weaker than the left even if her grip strength is 5/5). Negative for dizziness, tingling, tremors, sensory change, seizures, loss of consciousness, weakness and headaches.  Endo/Heme/Allergies: Negative for environmental allergies and polydipsia. Does not bruise/bleed easily.  Psychiatric/Behavioral: Positive for memory loss. Negative for depression and hallucinations. The patient is not nervous/anxious and does not have insomnia (better).      Past Medical History  Diagnosis Date  . Hypertension   . Depression     Medications: Reviewed at [facility]   Physical Exam: Physical Exam  Constitutional: She appears well-developed.  HENT:  Head: Normocephalic and atraumatic.  Left Ear: External ear normal.  Mouth/Throat: Oropharynx is clear and moist. Mucous membranes are not pale, not dry and not cyanotic. No oropharyngeal exudate.  Eyes: Conjunctivae and EOM are normal. Pupils are equal, round, and reactive to light.  Neck: Normal range of motion. Neck supple. No JVD present. No thyromegaly present.  Cardiovascular: Normal rate, regular rhythm and normal heart sounds.  Exam reveals no friction rub.   No murmur heard. Pulmonary/Chest: Effort normal and breath sounds normal. No respiratory distress. She has no wheezes. She has no rales.   Abdominal: Soft. Bowel sounds are normal. She exhibits no distension. There is no tenderness. There is no rebound and no guarding.  Genitourinary: Vagina normal.  Musculoskeletal: She exhibits no edema and no tenderness.       Right shoulder: She exhibits decreased strength.  Right sided paralysis with muscle strength 4/5  Lymphadenopathy:    She has no cervical adenopathy.  Neurological: She is alert. She displays abnormal reflex. No cranial nerve deficit. She exhibits abnormal muscle tone. Coordination abnormal.  Skin: Skin is warm and dry. No rash noted.  S/p right mastectomy  Psychiatric: Her mood appears not anxious. Her affect is not angry, not blunt, not labile and not inappropriate. Her speech is delayed and tangential. Her speech is not rapid and/or pressured and not slurred. She is slowed. She is not agitated, not aggressive, not hyperactive, not withdrawn, not actively hallucinating and not combative. Thought content is not paranoid and not delusional. Cognition and memory are impaired. She does not express impulsivity or inappropriate judgment. She does not exhibit a depressed mood. She is communicative. She exhibits abnormal recent memory. She exhibits normal remote memory. She is attentive.    Filed Vitals:   09/29/12 1239  BP: 131/68  Pulse: 63  Temp: 98.5 F (36.9 C)  TempSrc: Tympanic  Resp: 16      Labs reviewed: Basic Metabolic Panel:  Recent Labs  40/98/11  NA 138  K 2.9*  BUN 14  CREATININE 0.8  TSH 1.47   Liver Function Tests:  Recent Labs  04/06/12  AST 16  ALT 13  ALKPHOS 58   CBC:  Recent Labs  04/06/12 05/14/12  WBC 4.6 4.9  HGB 13.6 13.9  HCT 40 41  PLT 204 210   Assessment/Plan Protein S deficiency Protein S functional 28(69-129), total 43(60-150)--Coumadin discontinued and Xarelto 20mg  started.       Insomnia Much improved on Sertraline 75mg ,  Remeron 7.5mg  for rest and appetite(weight stable). Off Zaleplon 10mg   prn.      Depression Stable on Sertraline 75mg , Mirtazapine 7.5mg (sleeps well at night and weight has been stabilized), Ritalin down to 5mg  daily now.           Essential hypertension, benign Controlled on Lisinopril 5mg  daily          Hypertonicity of bladder Stable on Oxybutynin 10mg  daily        Urinary retention Stable since off Urecholine       GERD (gastroesophageal reflux disease) No further c/o heartburns since Urecholine was discontinued and since she is off Omeprazole since 09/22/12--dc Omeprazole and observe for return of targeted symptoms.      Family/ Staff Communication: observe the patient  Goals of Care: SNF  Labs/tests ordered: none

## 2012-09-29 NOTE — Assessment & Plan Note (Signed)
No further c/o heartburns since Urecholine was discontinued and since she is off Omeprazole since 09/22/12--dc Omeprazole and observe for return of targeted symptoms.         

## 2012-09-29 NOTE — Assessment & Plan Note (Signed)
Controlled on Lisinopril 5 mg daily  

## 2012-10-14 ENCOUNTER — Encounter: Payer: Self-pay | Admitting: *Deleted

## 2012-11-01 ENCOUNTER — Non-Acute Institutional Stay (SKILLED_NURSING_FACILITY): Payer: Medicare Other | Admitting: Nurse Practitioner

## 2012-11-01 ENCOUNTER — Encounter: Payer: Self-pay | Admitting: Nurse Practitioner

## 2012-11-01 DIAGNOSIS — N318 Other neuromuscular dysfunction of bladder: Secondary | ICD-10-CM

## 2012-11-01 DIAGNOSIS — I10 Essential (primary) hypertension: Secondary | ICD-10-CM

## 2012-11-01 DIAGNOSIS — F329 Major depressive disorder, single episode, unspecified: Secondary | ICD-10-CM

## 2012-11-01 DIAGNOSIS — D6859 Other primary thrombophilia: Secondary | ICD-10-CM

## 2012-11-01 DIAGNOSIS — K219 Gastro-esophageal reflux disease without esophagitis: Secondary | ICD-10-CM

## 2012-11-01 NOTE — Assessment & Plan Note (Signed)
Protein S functional 28(69-129), total 43(60-150)--Coumadin discontinued and Xarelto 20mg started.     

## 2012-11-01 NOTE — Assessment & Plan Note (Signed)
No further c/o heartburns since Urecholine was discontinued and since she is off Omeprazole since 09/22/12--dc Omeprazole and observe for return of targeted symptoms.         

## 2012-11-01 NOTE — Progress Notes (Signed)
Patient ID: Erica [name], female   DOB: 05-Dec-1927, 79 y.o.   MRN: [id number]  Code Status: DNR  Allergies  Allergen Reactions  . Actonel [Risedronate Sodium]   . Darvon [Propoxyphene Hcl]   . Hydrocodone Nausea And Vomiting  . Morphine And Related Nausea And Vomiting  . Oxycodone Hcl   . Pneumovax [Pneumococcal Polysaccharides]     Chief Complaint  Patient presents with  . Medical Managment of Chronic Issues    HPI: Patient is a 79 y.o. female seen in the SNF at [facility] today for evaluation of her chronic medical conditions.  Problem List Items Addressed This Visit   Depression     Stable on Sertraline 75mg , Mirtazapine 7.5mg (sleeps well at night and weight has been stabilized), Ritalin down to 5mg  daily now.               Essential hypertension, benign     Controlled on Lisinopril 5mg  daily. Check CBC and CMP              GERD (gastroesophageal reflux disease) - Primary     No further c/o heartburns since Urecholine was discontinued and since she is off Omeprazole since 09/22/12--dc Omeprazole and observe for return of targeted symptoms.        Hypertonicity of bladder     Stable on Oxybutynin 10mg  daily            Protein S deficiency     Protein S functional 28(69-129), total 43(60-150)--Coumadin discontinued and Xarelto 20mg  started.            Review of Systems:  Review of Systems  Constitutional: Negative for fever, chills, weight loss (weight gain ), malaise/fatigue and diaphoresis.  HENT: Positive for hearing loss. Negative for congestion, ear discharge and sore throat.   Eyes: Negative for pain, discharge and redness.  Respiratory: Negative for cough, sputum production and wheezing.   Cardiovascular: Negative for chest pain, palpitations, orthopnea, claudication, leg swelling and PND.  Gastrointestinal: Negative for heartburn, nausea, vomiting, abdominal pain, diarrhea, constipation and blood in stool.   Genitourinary: Positive for frequency. Negative for dysuria, urgency, hematuria and flank pain.       C/o left breast discomfort vs heart burn-resolved.   Musculoskeletal: Negative for back pain, falls, joint pain, myalgias and neck pain.  Skin: Negative for itching and rash.       The right mastectomy.   Neurological: Positive for speech change (expressive aphasia) and focal weakness (right hand is weaker than the left even if her grip strength is 5/5). Negative for dizziness, tingling, tremors, sensory change, seizures, loss of consciousness, weakness and headaches.  Endo/Heme/Allergies: Negative for environmental allergies and polydipsia. Does not bruise/bleed easily.  Psychiatric/Behavioral: Positive for memory loss. Negative for depression and hallucinations. The patient is not nervous/anxious and does not have insomnia (better).      Past Medical History  Diagnosis Date  . Hypertension   . Depression     Medications: Reviewed at [facility]   Physical Exam: Physical Exam  Constitutional: She appears well-developed.  HENT:  Head: Normocephalic and atraumatic.  Left Ear: External ear normal.  Mouth/Throat: Oropharynx is clear and moist. Mucous membranes are not pale, not dry and not cyanotic. No oropharyngeal exudate.  Eyes: Conjunctivae and EOM are normal. Pupils are equal, round, and reactive to light.  Neck: Normal range of motion. Neck supple. No JVD present. No thyromegaly present.  Cardiovascular: Normal rate, regular rhythm and normal heart sounds.  Exam reveals no friction  rub.   No murmur heard. Pulmonary/Chest: Effort normal and breath sounds normal. No respiratory distress. She has no wheezes. She has no rales.  Abdominal: Soft. Bowel sounds are normal. She exhibits no distension. There is no tenderness. There is no rebound and no guarding.  Genitourinary: Vagina normal.  Musculoskeletal: She exhibits no edema and no tenderness.       Right shoulder: She exhibits  decreased strength.  Right sided paralysis with muscle strength 4/5  Lymphadenopathy:    She has no cervical adenopathy.  Neurological: She is alert. She displays abnormal reflex. No cranial nerve deficit. She exhibits abnormal muscle tone. Coordination abnormal.  Skin: Skin is warm and dry. No rash noted.  S/p right mastectomy  Psychiatric: Her mood appears not anxious. Her affect is not angry, not blunt, not labile and not inappropriate. Her speech is delayed and tangential. Her speech is not rapid and/or pressured and not slurred. She is slowed. She is not agitated, not aggressive, not hyperactive, not withdrawn, not actively hallucinating and not combative. Thought content is not paranoid and not delusional. Cognition and memory are impaired. She does not express impulsivity or inappropriate judgment. She does not exhibit a depressed mood. She is communicative. She exhibits abnormal recent memory. She exhibits normal remote memory. She is attentive.    Filed Vitals:   11/01/12 1342  BP: 120/70  Pulse: 72  Temp: 97.1 F (36.2 C)  TempSrc: Tympanic  Resp: 18      Labs reviewed: Basic Metabolic Panel:  Recent Labs  69/62/95  NA 138  K 2.9*  3.9  BUN 14  CREATININE 0.8  TSH 1.47   Liver Function Tests:  Recent Labs  04/06/12  AST 16  ALT 13  ALKPHOS 58   CBC:  Recent Labs  04/06/12 05/14/12  WBC 4.6 4.9  HGB 13.6 13.9  HCT 40 41  PLT 204 210   Assessment/Plan GERD (gastroesophageal reflux disease) No further c/o heartburns since Urecholine was discontinued and since she is off Omeprazole since 09/22/12--dc Omeprazole and observe for return of targeted symptoms.      Depression Stable on Sertraline 75mg , Mirtazapine 7.5mg (sleeps well at night and weight has been stabilized), Ritalin down to 5mg  daily now.             Essential hypertension, benign Controlled on Lisinopril 5mg  daily. Check CBC and CMP            Hypertonicity of  bladder Stable on Oxybutynin 10mg  daily          Protein S deficiency Protein S functional 28(69-129), total 43(60-150)--Coumadin discontinued and Xarelto 20mg  started.         Family/ Staff Communication: observe the patient  Goals of Care: SNF  Labs/tests ordered: CBC and CMP

## 2012-11-01 NOTE — Assessment & Plan Note (Signed)
Stable on Sertraline 75mg, Mirtazapine 7.5mg(sleeps well at night and weight has been stabilized), Ritalin down to 5mg daily now.     

## 2012-11-01 NOTE — Assessment & Plan Note (Addendum)
Controlled on Lisinopril 5mg  daily. Check CBC and CMP

## 2012-11-01 NOTE — Assessment & Plan Note (Signed)
Stable on Oxybutynin 10mg daily               

## 2012-11-02 LAB — BASIC METABOLIC PANEL
BUN: 11 mg/dL (ref 4–21)
Creatinine: 0.8 mg/dL (ref 0.5–1.1)
Glucose: 80 mg/dL

## 2012-11-02 LAB — CBC AND DIFFERENTIAL
HCT: 38 % (ref 36–46)
Platelets: 188 10*3/uL (ref 150–399)

## 2012-11-02 LAB — HEPATIC FUNCTION PANEL
AST: 20 U/L (ref 13–35)
Alkaline Phosphatase: 69 U/L (ref 25–125)

## 2012-11-24 ENCOUNTER — Encounter: Payer: Self-pay | Admitting: Nurse Practitioner

## 2012-11-24 ENCOUNTER — Non-Acute Institutional Stay (SKILLED_NURSING_FACILITY): Payer: Medicare Other | Admitting: Nurse Practitioner

## 2012-11-24 DIAGNOSIS — I10 Essential (primary) hypertension: Secondary | ICD-10-CM

## 2012-11-24 DIAGNOSIS — F329 Major depressive disorder, single episode, unspecified: Secondary | ICD-10-CM

## 2012-11-24 DIAGNOSIS — B029 Zoster without complications: Secondary | ICD-10-CM | POA: Insufficient documentation

## 2012-11-24 DIAGNOSIS — N644 Mastodynia: Secondary | ICD-10-CM

## 2012-11-24 DIAGNOSIS — N318 Other neuromuscular dysfunction of bladder: Secondary | ICD-10-CM

## 2012-11-24 NOTE — Assessment & Plan Note (Addendum)
C/o left breast discomfort, no lump appreciated, unable to specify when or where--her Dtr POA said it was in gastric area after a meal. No further c/o on and off  Prilosec 20mg  daily until now. Mammogram pending. CXR to r/o pulmonary process.

## 2012-11-24 NOTE — Assessment & Plan Note (Signed)
C/o left breast pain for 2-3 days-noted linear rash under the left breast but not vesicles, no erythema, lump, warmth noted of the left breast. Had Mammogram last Thr-result pending. Will treat with Valacyclovir 1000mg  bid for 7 days. Observe the patient.

## 2012-11-24 NOTE — Progress Notes (Signed)
Patient ID: Erica [name], female   DOB: April 25, 1927, 79 y.o.   MRN: [id number]  Code Status: DNR  Allergies  Allergen Reactions  . Actonel [Risedronate Sodium]   . Darvon [Propoxyphene Hcl]   . Hydrocodone Nausea And Vomiting  . Morphine And Related Nausea And Vomiting  . Oxycodone Hcl   . Pneumovax [Pneumococcal Polysaccharides]     Chief Complaint  Patient presents with  . Acute Visit    shingles  . Medical Managment of Chronic Issues    HPI: Patient is a 79 y.o. female seen in the SNF at [facility] today for evaluation of left breast pain/under the left breast linear rash and her chronic medical conditions.  Problem List Items Addressed This Visit   Depression     Stable on Sertraline 75mg , Mirtazapine 7.5mg (sleeps well at night and weight has been stabilized), Ritalin down to 5mg  daily now.                 Essential hypertension, benign     Controlled on Lisinopril 5mg  daily.                 Hypertonicity of bladder     Stable on Oxybutynin 10mg  daily              Pain of left breast     C/o left breast discomfort, no lump appreciated, unable to specify when or where--her Dtr POA said it was in gastric area after a meal. No further c/o on and off  Prilosec 20mg  daily until now. Mammogram pending. CXR to r/o pulmonary process.       Shingles - Primary     C/o left breast pain for 2-3 days-noted linear rash under the left breast but not vesicles, no erythema, lump, warmth noted of the left breast. Had Mammogram last Thr-result pending. Will treat with Valacyclovir 1000mg  bid for 7 days. Observe the patient.        Review of Systems:  Review of Systems  Constitutional: Negative for fever, chills, weight loss (weight gain ), malaise/fatigue and diaphoresis.  HENT: Positive for hearing loss. Negative for congestion, ear discharge and sore throat.   Eyes: Negative for pain, discharge and redness.  Respiratory: Negative for  cough, sputum production and wheezing.   Cardiovascular: Negative for chest pain, palpitations, orthopnea, claudication, leg swelling and PND.  Gastrointestinal: Negative for heartburn, nausea, vomiting, abdominal pain, diarrhea, constipation and blood in stool.  Genitourinary: Positive for frequency. Negative for dysuria, urgency, hematuria and flank pain.       C/o left breast pain-recurred   Musculoskeletal: Negative for back pain, falls, joint pain, myalgias and neck pain.  Skin: Negative for itching and rash.       The right mastectomy. Under left breast linear rash  Neurological: Positive for speech change (expressive aphasia) and focal weakness (right hand is weaker than the left even if her grip strength is 5/5). Negative for dizziness, tingling, tremors, sensory change, seizures, loss of consciousness, weakness and headaches.  Endo/Heme/Allergies: Negative for environmental allergies and polydipsia. Does not bruise/bleed easily.  Psychiatric/Behavioral: Positive for memory loss. Negative for depression and hallucinations. The patient is not nervous/anxious and does not have insomnia (better).      Past Medical History  Diagnosis Date  . Hypertension   . Depression     Medications: Reviewed at [facility]   Physical Exam: Physical Exam  Constitutional: She appears well-developed.  HENT:  Head: Normocephalic and atraumatic.  Left Ear: External ear normal.  Mouth/Throat: Oropharynx is clear and moist. Mucous membranes are not pale, not dry and not cyanotic. No oropharyngeal exudate.  Eyes: Conjunctivae and EOM are normal. Pupils are equal, round, and reactive to light.  Neck: Normal range of motion. Neck supple. No JVD present. No thyromegaly present.  Cardiovascular: Normal rate, regular rhythm and normal heart sounds.  Exam reveals no friction rub.   No murmur heard. Pulmonary/Chest: Effort normal and breath sounds normal. No respiratory distress. She has no wheezes. She has no  rales.  Abdominal: Soft. Bowel sounds are normal. She exhibits no distension. There is no tenderness. There is no rebound and no guarding.  Genitourinary: Vagina normal.  Musculoskeletal: She exhibits no edema and no tenderness.       Right shoulder: She exhibits decreased strength.  Right sided paralysis with muscle strength 4/5  Lymphadenopathy:    She has no cervical adenopathy.  Neurological: She is alert. She displays abnormal reflex. No cranial nerve deficit. She exhibits abnormal muscle tone. Coordination abnormal.  Skin: Skin is warm and dry. No rash noted.  S/p right mastectomy. Linear rash under the left breast new.   Psychiatric: Her mood appears not anxious. Her affect is not angry, not blunt, not labile and not inappropriate. Her speech is delayed and tangential. Her speech is not rapid and/or pressured and not slurred. She is slowed. She is not agitated, not aggressive, not hyperactive, not withdrawn, not actively hallucinating and not combative. Thought content is not paranoid and not delusional. Cognition and memory are impaired. She does not express impulsivity or inappropriate judgment. She does not exhibit a depressed mood. She is communicative. She exhibits abnormal recent memory. She exhibits normal remote memory. She is attentive.    Filed Vitals:   11/24/12 1359  BP: 130/64  Pulse: 76  Temp: 97.6 F (36.4 C)  TempSrc: Tympanic  Resp: 18      Labs reviewed: Basic Metabolic Panel:  Recent Labs  95/62/13 11/02/12  NA 138 142  K 2.9*  3.9 4.0  BUN 14 11  CREATININE 0.8 0.8  TSH 1.47  --    Liver Function Tests:  Recent Labs  04/06/12 11/02/12  AST 16 20  ALT 13 17  ALKPHOS 58 69   CBC:  Recent Labs  04/06/12 05/14/12 11/02/12  WBC 4.6 4.9 4.3  HGB 13.6 13.9 13.0  HCT 40 41 38  PLT 204 210 188   Assessment/Plan Shingles C/o left breast pain for 2-3 days-noted linear rash under the left breast but not vesicles, no erythema, lump, warmth noted  of the left breast. Had Mammogram last Thr-result pending. Will treat with Valacyclovir 1000mg  bid for 7 days. Observe the patient.   Pain of left breast C/o left breast discomfort, no lump appreciated, unable to specify when or where--her Dtr POA said it was in gastric area after a meal. No further c/o on and off  Prilosec 20mg  daily until now. Mammogram pending. CXR to r/o pulmonary process.     Essential hypertension, benign Controlled on Lisinopril 5mg  daily.               Depression Stable on Sertraline 75mg , Mirtazapine 7.5mg (sleeps well at night and weight has been stabilized), Ritalin down to 5mg  daily now.               Hypertonicity of bladder Stable on Oxybutynin 10mg  daily              Family/ Staff Communication: observe the patient  Goals of  Care: SNF  Labs/tests ordered: CXR pending.

## 2012-11-24 NOTE — Assessment & Plan Note (Signed)
Stable on Oxybutynin 10mg daily               

## 2012-11-24 NOTE — Assessment & Plan Note (Signed)
Stable on Sertraline 75mg, Mirtazapine 7.5mg(sleeps well at night and weight has been stabilized), Ritalin down to 5mg daily now.     

## 2012-11-24 NOTE — Assessment & Plan Note (Signed)
Controlled on Lisinopril 5 mg daily  

## 2012-12-27 ENCOUNTER — Non-Acute Institutional Stay (SKILLED_NURSING_FACILITY): Payer: Medicare Other | Admitting: Nurse Practitioner

## 2012-12-27 ENCOUNTER — Encounter: Payer: Self-pay | Admitting: Nurse Practitioner

## 2012-12-27 DIAGNOSIS — N318 Other neuromuscular dysfunction of bladder: Secondary | ICD-10-CM

## 2012-12-27 DIAGNOSIS — N644 Mastodynia: Secondary | ICD-10-CM

## 2012-12-27 DIAGNOSIS — F329 Major depressive disorder, single episode, unspecified: Secondary | ICD-10-CM

## 2012-12-27 DIAGNOSIS — I10 Essential (primary) hypertension: Secondary | ICD-10-CM

## 2012-12-27 DIAGNOSIS — K219 Gastro-esophageal reflux disease without esophagitis: Secondary | ICD-10-CM

## 2012-12-27 DIAGNOSIS — J069 Acute upper respiratory infection, unspecified: Secondary | ICD-10-CM | POA: Insufficient documentation

## 2012-12-27 NOTE — Assessment & Plan Note (Signed)
Resolved

## 2012-12-27 NOTE — Progress Notes (Signed)
Patient ID: Erica [name], female   DOB: [birth year], 78 y.o.   MRN: [id number]   Code Status: DNR  Allergies  Allergen Reactions  . Actonel [Risedronate Sodium]   . Darvon [Propoxyphene Hcl]   . Hydrocodone Nausea And Vomiting  . Morphine And Related Nausea And Vomiting  . Oxycodone Hcl   . Pneumovax [Pneumococcal Polysaccharides]     Chief Complaint  Patient presents with  . Acute Visit    nasal congestion, lower grade T  . Medical Managment of Chronic Issues    HPI: Patient is a 79 y.o. female seen in the SNF at [facility] today for evaluation of  Low grade T/nasal congestion,  and other chronic medical conditions.  Problem List Items Addressed This Visit   Essential hypertension, benign     Controlled on Lisinopril 5mg  daily.                   Depression     Stable on Sertraline 75mg , Mirtazapine 7.5mg (sleeps well at night and weight has been stabilized), Ritalin down to 5mg  daily now.                   Hypertonicity of bladder     Stable on Oxybutynin 10mg  daily                Pain of left breast     Resolved.     GERD (gastroesophageal reflux disease)     No further c/o heartburns since Urecholine was discontinued and since she is off Omeprazole since 09/22/12--dc Omeprazole and observe for return of targeted symptoms.          Acute upper respiratory infections of unspecified site - Primary     Nasal congestion and lower grade T for 1 day--will have Atovent nasal spray II 0.06% tid each nostril x 1week, Augmentin 875mg  q12hr for 5 days alone with FloraStor bid. Observe the patient.        Review of Systems:  Review of Systems  Constitutional: Positive for fever. Negative for chills, weight loss (weight gain ), malaise/fatigue and diaphoresis.  HENT: Positive for congestion and hearing loss. Negative for ear discharge and sore throat.   Eyes: Negative for pain, discharge and redness.  Respiratory: Negative for  cough, sputum production and wheezing.   Cardiovascular: Negative for chest pain, palpitations, orthopnea, claudication, leg swelling and PND.  Gastrointestinal: Negative for heartburn, nausea, vomiting, abdominal pain, diarrhea, constipation and blood in stool.  Genitourinary: Positive for frequency. Negative for dysuria, urgency, hematuria and flank pain.       C/o left breast pain-recurred   Musculoskeletal: Negative for back pain, falls, joint pain, myalgias and neck pain.  Skin: Negative for itching and rash.       The right mastectomy. Under left breast linear rash  Neurological: Positive for speech change (expressive aphasia) and focal weakness (right hand is weaker than the left even if her grip strength is 5/5). Negative for dizziness, tingling, tremors, sensory change, seizures, loss of consciousness, weakness and headaches.  Endo/Heme/Allergies: Negative for environmental allergies and polydipsia. Does not bruise/bleed easily.  Psychiatric/Behavioral: Positive for memory loss. Negative for depression and hallucinations. The patient is not nervous/anxious and does not have insomnia (better).      Past Medical History  Diagnosis Date  . Hypertension   . Depression    Medications: Patient's Medications  New Prescriptions   No medications on file  Previous Medications   ACETAMINOPHEN (TYLENOL) 325 MG TABLET  Take 650 mg by mouth every 4 (four) hours as needed for pain.   ALUM & MAG HYDROXIDE-SIMETH (MAGIC MOUTHWASH) SOLN    Take 5 mLs by mouth 2 (two) times daily before a meal. Use before meals and at HS swish and swallow,prn for mouth sores.   ATORVASTATIN (LIPITOR) 10 MG TABLET    Take 10 mg by mouth daily.   BETA CAROTENE W/MINERALS (OCUVITE) TABLET    Take 1 tablet by mouth daily.   BETHANECHOL (URECHOLINE) 10 MG TABLET    Take 10 mg by mouth 3 (three) times daily.   CALCIUM CARBONATE (OS-CAL) 600 MG TABS    Take 600 mg by mouth 2 (two) times daily with a meal.   LISINOPRIL  (PRINIVIL,ZESTRIL) 5 MG TABLET    Take 5 mg by mouth daily.   METHYLPHENIDATE (RITALIN) 5 MG TABLET    Take one tablet by mouth in the morning   MIRTAZAPINE (REMERON) 7.5 MG TABLET    Take 7.5 mg by mouth at bedtime and may repeat dose one time if needed.   RIVAROXABAN (XARELTO) 20 MG TABS TABLET    Take by mouth daily.   SERTRALINE (ZOLOFT) 25 MG TABLET    Take 75 mg by mouth daily.   SOLIFENACIN (VESICARE) 5 MG TABLET    Take 10 mg by mouth 2 (two) times daily.  Modified Medications   No medications on file  Discontinued Medications   No medications on file     Physical Exam: Physical Exam  Constitutional: She appears well-developed.  Grade T  HENT:  Head: Normocephalic and atraumatic.  Left Ear: External ear normal.  Mouth/Throat: Oropharynx is clear and moist. Mucous membranes are not pale, not dry and not cyanotic. No oropharyngeal exudate.  Mild throat erythema. Nasal congestion with clear drainage.   Eyes: Conjunctivae and EOM are normal. Pupils are equal, round, and reactive to light.  Neck: Normal range of motion. Neck supple. No JVD present. No thyromegaly present.  Cardiovascular: Normal rate, regular rhythm and normal heart sounds.  Exam reveals no friction rub.   No murmur heard. Pulmonary/Chest: Effort normal and breath sounds normal. No respiratory distress. She has no wheezes. She has no rales.  Abdominal: Soft. Bowel sounds are normal. She exhibits no distension. There is no tenderness. There is no rebound and no guarding.  Genitourinary: Vagina normal.  Musculoskeletal: She exhibits no edema and no tenderness.       Right shoulder: She exhibits decreased strength.  Right sided paralysis with muscle strength 4/5  Lymphadenopathy:    She has no cervical adenopathy.  Neurological: She is alert. She displays abnormal reflex. No cranial nerve deficit. She exhibits abnormal muscle tone. Coordination abnormal.  Skin: Skin is warm and dry. No rash noted.  S/p right  mastectomy. Linear rash under the left breast new.   Psychiatric: Her mood appears not anxious. Her affect is not angry, not blunt, not labile and not inappropriate. Her speech is delayed and tangential. Her speech is not rapid and/or pressured and not slurred. She is slowed. She is not agitated, not aggressive, not hyperactive, not withdrawn, not actively hallucinating and not combative. Thought content is not paranoid and not delusional. Cognition and memory are impaired. She does not express impulsivity or inappropriate judgment. She does not exhibit a depressed mood. She is communicative. She exhibits abnormal recent memory. She exhibits normal remote memory. She is attentive.    Filed Vitals:   12/27/12 1343  BP: 156/70  Pulse: 68  Temp: 98.2 F (36.8 C)  TempSrc: Tympanic  Resp: 16      Labs reviewed: Basic Metabolic Panel:  Recent Labs  40/98/11 11/02/12  NA 138 142  K 2.9*  3.9 4.0  BUN 14 11  CREATININE 0.8 0.8  TSH 1.47  --    Liver Function Tests:  Recent Labs  04/06/12 11/02/12  AST 16 20  ALT 13 17  ALKPHOS 58 69   CBC:  Recent Labs  04/06/12 05/14/12 11/02/12  WBC 4.6 4.9 4.3  HGB 13.6 13.9 13.0  HCT 40 41 38  PLT 204 210 188    Assessment/Plan Acute upper respiratory infections of unspecified site Nasal congestion and lower grade T for 1 day--will have Atovent nasal spray II 0.06% tid each nostril x 1week, Augmentin 875mg  q12hr for 5 days alone with FloraStor bid. Observe the patient.   GERD (gastroesophageal reflux disease) No further c/o heartburns since Urecholine was discontinued and since she is off Omeprazole since 09/22/12--dc Omeprazole and observe for return of targeted symptoms.        Pain of left breast Resolved.   Hypertonicity of bladder Stable on Oxybutynin 10mg  daily              Depression Stable on Sertraline 75mg , Mirtazapine 7.5mg (sleeps well at night and weight has been stabilized), Ritalin down to 5mg   daily now.                 Essential hypertension, benign Controlled on Lisinopril 5mg  daily.                   Family/ Staff Communication: observe the patient  Goals of Care: SNF  Labs/tests ordered: none

## 2012-12-27 NOTE — Assessment & Plan Note (Signed)
Stable on Sertraline 75mg, Mirtazapine 7.5mg(sleeps well at night and weight has been stabilized), Ritalin down to 5mg daily now.     

## 2012-12-27 NOTE — Assessment & Plan Note (Signed)
Nasal congestion and lower grade T for 1 day--will have Atovent nasal spray II 0.06% tid each nostril x 1week, Augmentin 875mg  q12hr for 5 days alone with FloraStor bid. Observe the patient.

## 2012-12-27 NOTE — Assessment & Plan Note (Signed)
Stable on Oxybutynin 10mg daily               

## 2012-12-27 NOTE — Assessment & Plan Note (Signed)
Controlled on Lisinopril 5 mg daily  

## 2012-12-27 NOTE — Assessment & Plan Note (Signed)
No further c/o heartburns since Urecholine was discontinued and since she is off Omeprazole since 09/22/12--dc Omeprazole and observe for return of targeted symptoms.         

## 2013-01-31 ENCOUNTER — Non-Acute Institutional Stay (SKILLED_NURSING_FACILITY): Payer: Medicare Other | Admitting: Nurse Practitioner

## 2013-01-31 DIAGNOSIS — F32A Depression, unspecified: Secondary | ICD-10-CM

## 2013-01-31 DIAGNOSIS — F3289 Other specified depressive episodes: Secondary | ICD-10-CM

## 2013-01-31 DIAGNOSIS — N644 Mastodynia: Secondary | ICD-10-CM

## 2013-01-31 DIAGNOSIS — D6859 Other primary thrombophilia: Secondary | ICD-10-CM

## 2013-01-31 DIAGNOSIS — G4700 Insomnia, unspecified: Secondary | ICD-10-CM

## 2013-01-31 DIAGNOSIS — F329 Major depressive disorder, single episode, unspecified: Secondary | ICD-10-CM

## 2013-01-31 DIAGNOSIS — K219 Gastro-esophageal reflux disease without esophagitis: Secondary | ICD-10-CM

## 2013-01-31 DIAGNOSIS — B029 Zoster without complications: Secondary | ICD-10-CM

## 2013-01-31 DIAGNOSIS — N318 Other neuromuscular dysfunction of bladder: Secondary | ICD-10-CM

## 2013-01-31 DIAGNOSIS — I10 Essential (primary) hypertension: Secondary | ICD-10-CM

## 2013-02-02 ENCOUNTER — Encounter: Payer: Self-pay | Admitting: Nurse Practitioner

## 2013-02-02 NOTE — Progress Notes (Signed)
Patient ID: Erica [name], female   DOB: [birth year], 80 y.o.   MRN: [id number]   Code Status: DNR  Allergies  Allergen Reactions  . Actonel [Risedronate Sodium]   . Darvon [Propoxyphene Hcl]   . Hydrocodone Nausea And Vomiting  . Morphine And Related Nausea And Vomiting  . Oxycodone Hcl   . Pneumovax [Pneumococcal Polysaccharides]     Chief Complaint  Patient presents with  . Medical Managment of Chronic Issues    HPI: Patient is a 80 y.o. female seen in the SNF at [facility] today for evaluation of chronic medical conditions.  Problem List Items Addressed This Visit   Depression     Stable on Sertraline 75mg , Mirtazapine 7.5mg (sleeps well at night and weight has been stabilized), Ritalin down to 5mg  daily now.                     Essential hypertension, benign     Controlled on Lisinopril 5mg  daily.                     GERD (gastroesophageal reflux disease)     No further c/o heartburns since Urecholine was discontinued and since she is off Omeprazole since 09/22/12--dc Omeprazole and observe for return of targeted symptoms.            Hypertonicity of bladder     Stable on Oxybutynin 10mg  daily                  Insomnia     Much improved on Sertraline 75mg ,  Remeron 7.5mg  for rest and appetite(weight stable). Off Zaleplon 10mg  prn.          Pain of left breast - Primary     Resolved.     Protein C deficiency     Protein S functional 28(69-129), total 43(60-150)--Coumadin discontinued and Xarelto 20mg  started.           Shingles     Resolved.        Review of Systems:  Review of Systems  Constitutional: Negative for fever, chills, weight loss (weight gain ), malaise/fatigue and diaphoresis.  HENT: Positive for hearing loss. Negative for congestion, ear discharge and sore throat.   Eyes: Negative for pain, discharge and redness.  Respiratory: Negative for cough, sputum production and  wheezing.   Cardiovascular: Negative for chest pain, palpitations, orthopnea, claudication, leg swelling and PND.  Gastrointestinal: Negative for heartburn, nausea, vomiting, abdominal pain, diarrhea, constipation and blood in stool.  Genitourinary: Positive for frequency. Negative for dysuria, urgency, hematuria and flank pain.       C/o left breast pain-resolved.   Musculoskeletal: Negative for back pain, falls, joint pain, myalgias and neck pain.  Skin: Positive for itching. Negative for rash.       The right mastectomy. Itching skin without apparent rash  Neurological: Positive for speech change (expressive aphasia) and focal weakness (right hand is weaker than the left even if her grip strength is 5/5). Negative for dizziness, tingling, tremors, sensory change, seizures, loss of consciousness, weakness and headaches.  Endo/Heme/Allergies: Negative for environmental allergies and polydipsia. Does not bruise/bleed easily.  Psychiatric/Behavioral: Positive for memory loss. Negative for depression and hallucinations. The patient is not nervous/anxious and does not have insomnia (better).      Past Medical History  Diagnosis Date  . Hypertension   . Depression    Medications: Patient's Medications  New Prescriptions   No medications on file  Previous Medications  ACETAMINOPHEN (TYLENOL) 325 MG TABLET    Take 650 mg by mouth every 4 (four) hours as needed for pain.   ALUM & MAG HYDROXIDE-SIMETH (MAGIC MOUTHWASH) SOLN    Take 5 mLs by mouth 2 (two) times daily before a meal. Use before meals and at HS swish and swallow,prn for mouth sores.   ATORVASTATIN (LIPITOR) 10 MG TABLET    Take 10 mg by mouth daily.   BETA CAROTENE W/MINERALS (OCUVITE) TABLET    Take 1 tablet by mouth daily.   BETHANECHOL (URECHOLINE) 10 MG TABLET    Take 10 mg by mouth 3 (three) times daily.   CALCIUM CARBONATE (OS-CAL) 600 MG TABS    Take 600 mg by mouth 2 (two) times daily with a meal.   LISINOPRIL  (PRINIVIL,ZESTRIL) 5 MG TABLET    Take 5 mg by mouth daily.   METHYLPHENIDATE (RITALIN) 5 MG TABLET    Take one tablet by mouth in the morning   MIRTAZAPINE (REMERON) 7.5 MG TABLET    Take 7.5 mg by mouth at bedtime and may repeat dose one time if needed.   RIVAROXABAN (XARELTO) 20 MG TABS TABLET    Take by mouth daily.   SERTRALINE (ZOLOFT) 25 MG TABLET    Take 75 mg by mouth daily.   SOLIFENACIN (VESICARE) 5 MG TABLET    Take 10 mg by mouth 2 (two) times daily.  Modified Medications   No medications on file  Discontinued Medications   No medications on file     Physical Exam: Physical Exam  Constitutional: She appears well-developed.  HENT:  Head: Normocephalic and atraumatic.  Left Ear: External ear normal.  Mouth/Throat: Oropharynx is clear and moist. Mucous membranes are not pale, not dry and not cyanotic. No oropharyngeal exudate.  Eyes: Conjunctivae and EOM are normal. Pupils are equal, round, and reactive to light.  Neck: Normal range of motion. Neck supple. No JVD present. No thyromegaly present.  Cardiovascular: Normal rate, regular rhythm and normal heart sounds.  Exam reveals no friction rub.   No murmur heard. Pulmonary/Chest: Effort normal and breath sounds normal. No respiratory distress. She has no wheezes. She has no rales.  Abdominal: Soft. Bowel sounds are normal. She exhibits no distension. There is no tenderness. There is no rebound and no guarding.  Genitourinary: Vagina normal.  Musculoskeletal: She exhibits no edema and no tenderness.       Right shoulder: She exhibits decreased strength.  Right sided paralysis with muscle strength 4/5  Lymphadenopathy:    She has no cervical adenopathy.  Neurological: She is alert. She displays abnormal reflex. No cranial nerve deficit. She exhibits abnormal muscle tone. Coordination abnormal.  Skin: Skin is warm and dry. No rash noted.  S/p right mastectomy.   Psychiatric: Her mood appears not anxious. Her affect is not  angry, not blunt, not labile and not inappropriate. Her speech is delayed and tangential. Her speech is not rapid and/or pressured and not slurred. She is slowed. She is not agitated, not aggressive, not hyperactive, not withdrawn, not actively hallucinating and not combative. Thought content is not paranoid and not delusional. Cognition and memory are impaired. She does not express impulsivity or inappropriate judgment. She does not exhibit a depressed mood. She is communicative. She exhibits abnormal recent memory. She exhibits normal remote memory. She is attentive.    Filed Vitals:   02/02/13 1102  BP: 119/51  Pulse: 69  Temp: 97 F (36.1 C)  TempSrc: Tympanic  Resp: 18  Labs reviewed: Basic Metabolic Panel:  Recent Labs  04/06/12 11/02/12  NA 138 142  K 2.9*  3.9 4.0  BUN 14 11  CREATININE 0.8 0.8  TSH 1.47  --    Liver Function Tests:  Recent Labs  04/06/12 11/02/12  AST 16 20  ALT 13 17  ALKPHOS 58 69   CBC:  Recent Labs  04/06/12 05/14/12 11/02/12  WBC 4.6 4.9 4.3  HGB 13.6 13.9 13.0  HCT 40 41 38  PLT 204 210 188    Assessment/Plan Pain of left breast Resolved.   Shingles Resolved.   Protein C deficiency Protein S functional 28(69-129), total 43(60-150)--Coumadin discontinued and Xarelto 20mg  started.         Hypertonicity of bladder Stable on Oxybutynin 10mg  daily                GERD (gastroesophageal reflux disease) No further c/o heartburns since Urecholine was discontinued and since she is off Omeprazole since 09/22/12--dc Omeprazole and observe for return of targeted symptoms.          Depression Stable on Sertraline 75mg , Mirtazapine 7.5mg (sleeps well at night and weight has been stabilized), Ritalin down to 5mg  daily now.                   Insomnia Much improved on Sertraline 75mg ,  Remeron 7.5mg  for rest and appetite(weight stable). Off Zaleplon 10mg  prn.        Essential  hypertension, benign Controlled on Lisinopril 5mg  daily.                     Family/ Staff Communication: observe the patient  Goals of Care: SNF  Labs/tests ordered: none

## 2013-02-02 NOTE — Assessment & Plan Note (Signed)
Much improved on Sertraline 75mg ,  Remeron 7.5mg  for rest and appetite(weight stable). Off Zaleplon 10mg  prn.

## 2013-02-02 NOTE — Assessment & Plan Note (Signed)
Resolved

## 2013-02-02 NOTE — Assessment & Plan Note (Signed)
Controlled on Lisinopril 5 mg daily  

## 2013-02-02 NOTE — Assessment & Plan Note (Signed)
Protein S functional 28(69-129), total 43(60-150)--Coumadin discontinued and Xarelto 20mg started.     

## 2013-02-02 NOTE — Assessment & Plan Note (Signed)
Stable on Sertraline 75mg , Mirtazapine 7.5mg (sleeps well at night and weight has been stabilized), Ritalin down to 5mg  daily now.

## 2013-02-02 NOTE — Assessment & Plan Note (Signed)
No further c/o heartburns since Urecholine was discontinued and since she is off Omeprazole since 09/22/12--dc Omeprazole and observe for return of targeted symptoms.

## 2013-02-02 NOTE — Assessment & Plan Note (Signed)
Stable on Oxybutynin 10mg  daily

## 2013-03-02 ENCOUNTER — Non-Acute Institutional Stay (SKILLED_NURSING_FACILITY): Payer: Medicare Other | Admitting: Nurse Practitioner

## 2013-03-02 ENCOUNTER — Encounter: Payer: Self-pay | Admitting: Nurse Practitioner

## 2013-03-02 DIAGNOSIS — N318 Other neuromuscular dysfunction of bladder: Secondary | ICD-10-CM

## 2013-03-02 DIAGNOSIS — I10 Essential (primary) hypertension: Secondary | ICD-10-CM

## 2013-03-02 DIAGNOSIS — K219 Gastro-esophageal reflux disease without esophagitis: Secondary | ICD-10-CM

## 2013-03-02 DIAGNOSIS — F3289 Other specified depressive episodes: Secondary | ICD-10-CM

## 2013-03-02 DIAGNOSIS — I639 Cerebral infarction, unspecified: Secondary | ICD-10-CM

## 2013-03-02 DIAGNOSIS — R1310 Dysphagia, unspecified: Secondary | ICD-10-CM

## 2013-03-02 DIAGNOSIS — I6350 Cerebral infarction due to unspecified occlusion or stenosis of unspecified cerebral artery: Secondary | ICD-10-CM

## 2013-03-02 DIAGNOSIS — D6859 Other primary thrombophilia: Secondary | ICD-10-CM

## 2013-03-02 DIAGNOSIS — G4700 Insomnia, unspecified: Secondary | ICD-10-CM

## 2013-03-02 DIAGNOSIS — F32A Depression, unspecified: Secondary | ICD-10-CM

## 2013-03-02 DIAGNOSIS — F329 Major depressive disorder, single episode, unspecified: Secondary | ICD-10-CM

## 2013-03-02 NOTE — Assessment & Plan Note (Signed)
Stable on Oxybutynin 10mg daily               

## 2013-03-02 NOTE — Assessment & Plan Note (Signed)
No aspiration episodes

## 2013-03-02 NOTE — Assessment & Plan Note (Signed)
Stable on Sertraline 75mg, Mirtazapine 7.5mg(sleeps well at night and weight has been stabilized), Ritalin down to 5mg daily now.     

## 2013-03-02 NOTE — Progress Notes (Signed)
Patient ID: Erica [name], female   DOB: 03-Nov-1927, 80 y.o.   MRN: [id number]   Code Status: DNR  Allergies  Allergen Reactions  . Actonel [Risedronate Sodium]   . Darvon [Propoxyphene Hcl]   . Hydrocodone Nausea And Vomiting  . Morphine And Related Nausea And Vomiting  . Oxycodone Hcl   . Pneumovax [Pneumococcal Polysaccharides]     Chief Complaint  Patient presents with  . Medical Managment of Chronic Issues    HPI: Patient is a 80 y.o. female seen in the SNF at [facility] today for evaluation of chronic medical conditions.  Problem List Items Addressed This Visit   Protein S deficiency     Protein S functional 28(69-129), total 43(60-150)--Coumadin discontinued and Xarelto 20mg  started.             Insomnia     Much improved on Sertraline 75mg ,  Remeron 7.5mg  for rest and appetite(weight stable). Off Zaleplon 10mg  prn.      Hypertonicity of bladder     Stable on Oxybutynin 10mg  daily      GERD (gastroesophageal reflux disease)     No further c/o heartburns since Urecholine was discontinued and since she is off Omeprazole since 09/22/12--dc Omeprazole and observe for return of targeted symptoms.       Essential hypertension, benign - Primary     Controlled on Lisinopril 5mg  daily.        Dysphagia, unspecified     No aspiration episodes     Depression     Stable on Sertraline 75mg , Mirtazapine 7.5mg (sleeps well at night and weight has been stabilized), Ritalin down to 5mg  daily now.       CVA (cerebral vascular accident)     Left parietal and occipital hemorrhagic stroke 11/2011. Right  hand grip strength is weaker--cannot make a tight fist even if muscle strength is 5/5. Expressive aphasia persisted.         Review of Systems:  Review of Systems  Constitutional: Negative for fever, chills, weight loss (weight gain ), malaise/fatigue and diaphoresis.  HENT: Positive for hearing loss. Negative for congestion, ear discharge and sore  throat.   Eyes: Negative for pain, discharge and redness.  Respiratory: Negative for cough, sputum production and wheezing.   Cardiovascular: Negative for chest pain, palpitations, orthopnea, claudication, leg swelling and PND.  Gastrointestinal: Negative for heartburn, nausea, vomiting, abdominal pain, diarrhea, constipation and blood in stool.  Genitourinary: Positive for frequency. Negative for dysuria, urgency, hematuria and flank pain.       C/o left breast pain-resolved.   Musculoskeletal: Negative for back pain, falls, joint pain, myalgias and neck pain.  Skin: Positive for itching. Negative for rash.       The right mastectomy. Itching skin without apparent rash  Neurological: Positive for speech change (expressive aphasia) and focal weakness (right hand is weaker than the left even if her grip strength is 5/5). Negative for dizziness, tingling, tremors, sensory change, seizures, loss of consciousness, weakness and headaches.  Endo/Heme/Allergies: Negative for environmental allergies and polydipsia. Does not bruise/bleed easily.  Psychiatric/Behavioral: Positive for memory loss. Negative for depression and hallucinations. The patient is not nervous/anxious and does not have insomnia (better).      Past Medical History  Diagnosis Date  . Hypertension   . Depression    Medications: Patient's Medications  New Prescriptions   No medications on file  Previous Medications   ACETAMINOPHEN (TYLENOL) 325 MG TABLET    Take 650 mg by mouth every 4 (  four) hours as needed for pain.   ALUM & MAG HYDROXIDE-SIMETH (MAGIC MOUTHWASH) SOLN    Take 5 mLs by mouth 2 (two) times daily before a meal. Use before meals and at HS swish and swallow,prn for mouth sores.   ATORVASTATIN (LIPITOR) 10 MG TABLET    Take 10 mg by mouth daily.   BETA CAROTENE W/MINERALS (OCUVITE) TABLET    Take 1 tablet by mouth daily.   BETHANECHOL (URECHOLINE) 10 MG TABLET    Take 10 mg by mouth 3 (three) times daily.    CALCIUM CARBONATE (OS-CAL) 600 MG TABS    Take 600 mg by mouth 2 (two) times daily with a meal.   LISINOPRIL (PRINIVIL,ZESTRIL) 5 MG TABLET    Take 5 mg by mouth daily.   METHYLPHENIDATE (RITALIN) 5 MG TABLET    Take one tablet by mouth in the morning   MIRTAZAPINE (REMERON) 7.5 MG TABLET    Take 7.5 mg by mouth at bedtime and may repeat dose one time if needed.   RIVAROXABAN (XARELTO) 20 MG TABS TABLET    Take by mouth daily.   SERTRALINE (ZOLOFT) 25 MG TABLET    Take 75 mg by mouth daily.   SOLIFENACIN (VESICARE) 5 MG TABLET    Take 10 mg by mouth 2 (two) times daily.  Modified Medications   No medications on file  Discontinued Medications   No medications on file     Physical Exam: Physical Exam  Constitutional: She appears well-developed.  HENT:  Head: Normocephalic and atraumatic.  Left Ear: External ear normal.  Mouth/Throat: Oropharynx is clear and moist. Mucous membranes are not pale, not dry and not cyanotic. No oropharyngeal exudate.  Eyes: Conjunctivae and EOM are normal. Pupils are equal, round, and reactive to light.  Neck: Normal range of motion. Neck supple. No JVD present. No thyromegaly present.  Cardiovascular: Normal rate, regular rhythm and normal heart sounds.  Exam reveals no friction rub.   No murmur heard. Pulmonary/Chest: Effort normal and breath sounds normal. No respiratory distress. She has no wheezes. She has no rales.  Abdominal: Soft. Bowel sounds are normal. She exhibits no distension. There is no tenderness. There is no rebound and no guarding.  Genitourinary: Vagina normal.  Musculoskeletal: She exhibits no edema and no tenderness.       Right shoulder: She exhibits decreased strength.  Right sided paralysis with muscle strength 4/5  Lymphadenopathy:    She has no cervical adenopathy.  Neurological: She is alert. She displays abnormal reflex. No cranial nerve deficit. She exhibits abnormal muscle tone. Coordination abnormal.  Skin: Skin is warm and  dry. No rash noted.  S/p right mastectomy.   Psychiatric: Her mood appears not anxious. Her affect is not angry, not blunt, not labile and not inappropriate. Her speech is delayed and tangential. Her speech is not rapid and/or pressured and not slurred. She is slowed. She is not agitated, not aggressive, not hyperactive, not withdrawn, not actively hallucinating and not combative. Thought content is not paranoid and not delusional. Cognition and memory are impaired. She does not express impulsivity or inappropriate judgment. She does not exhibit a depressed mood. She is communicative. She exhibits abnormal recent memory. She exhibits normal remote memory. She is attentive.    Filed Vitals:   03/02/13 1435  BP: 139/65  Pulse: 71  Temp: 98.3 F (36.8 C)  TempSrc: Tympanic  Resp: 16      Labs reviewed: Basic Metabolic Panel:  Recent Labs  04/06/12 11/02/12  NA 138 142  K 2.9*  3.9 4.0  BUN 14 11  CREATININE 0.8 0.8  TSH 1.47  --    Liver Function Tests:  Recent Labs  04/06/12 11/02/12  AST 16 20  ALT 13 17  ALKPHOS 58 69   CBC:  Recent Labs  04/06/12 05/14/12 11/02/12  WBC 4.6 4.9 4.3  HGB 13.6 13.9 13.0  HCT 40 41 38  PLT 204 210 188    Assessment/Plan Essential hypertension, benign Controlled on Lisinopril 5mg  daily.      Depression Stable on Sertraline 75mg , Mirtazapine 7.5mg (sleeps well at night and weight has been stabilized), Ritalin down to 5mg  daily now.     Dysphagia, unspecified No aspiration episodes   CVA (cerebral vascular accident) Left parietal and occipital hemorrhagic stroke 11/2011. Right  hand grip strength is weaker--cannot make a tight fist even if muscle strength is 5/5. Expressive aphasia persisted.    Insomnia Much improved on Sertraline 75mg ,  Remeron 7.5mg  for rest and appetite(weight stable). Off Zaleplon 10mg  prn.    Hypertonicity of bladder Stable on Oxybutynin 10mg  daily    GERD (gastroesophageal reflux  disease) No further c/o heartburns since Urecholine was discontinued and since she is off Omeprazole since 09/22/12--dc Omeprazole and observe for return of targeted symptoms.     Protein S deficiency Protein S functional 28(69-129), total 43(60-150)--Coumadin discontinued and Xarelto 20mg  started.             Family/ Staff Communication: observe the patient  Goals of Care: SNF  Labs/tests ordered: none

## 2013-03-02 NOTE — Assessment & Plan Note (Signed)
Protein S functional 28(69-129), total 43(60-150)--Coumadin discontinued and Xarelto 20mg started.     

## 2013-03-02 NOTE — Assessment & Plan Note (Signed)
Controlled on Lisinopril 5 mg daily  

## 2013-03-02 NOTE — Assessment & Plan Note (Signed)
No further c/o heartburns since Urecholine was discontinued and since she is off Omeprazole since 09/22/12--dc Omeprazole and observe for return of targeted symptoms.         

## 2013-03-02 NOTE — Assessment & Plan Note (Signed)
Much improved on Sertraline 75mg,  Remeron 7.5mg for rest and appetite(weight stable). Off Zaleplon 10mg prn.       

## 2013-03-02 NOTE — Assessment & Plan Note (Signed)
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 Right  hand grip strength is weaker--cannot make a tight fist even if muscle strength is 5/5. Expressive aphasia persisted.

## 2013-04-04 ENCOUNTER — Encounter: Payer: Self-pay | Admitting: Nurse Practitioner

## 2013-04-04 ENCOUNTER — Non-Acute Institutional Stay (SKILLED_NURSING_FACILITY): Payer: Medicare Other | Admitting: Nurse Practitioner

## 2013-04-04 DIAGNOSIS — F329 Major depressive disorder, single episode, unspecified: Secondary | ICD-10-CM

## 2013-04-04 DIAGNOSIS — I6350 Cerebral infarction due to unspecified occlusion or stenosis of unspecified cerebral artery: Secondary | ICD-10-CM

## 2013-04-04 DIAGNOSIS — R635 Abnormal weight gain: Secondary | ICD-10-CM

## 2013-04-04 DIAGNOSIS — D6859 Other primary thrombophilia: Secondary | ICD-10-CM

## 2013-04-04 DIAGNOSIS — I10 Essential (primary) hypertension: Secondary | ICD-10-CM

## 2013-04-04 DIAGNOSIS — F3289 Other specified depressive episodes: Secondary | ICD-10-CM

## 2013-04-04 DIAGNOSIS — F32A Depression, unspecified: Secondary | ICD-10-CM

## 2013-04-04 DIAGNOSIS — I639 Cerebral infarction, unspecified: Secondary | ICD-10-CM

## 2013-04-04 DIAGNOSIS — N318 Other neuromuscular dysfunction of bladder: Secondary | ICD-10-CM

## 2013-04-04 DIAGNOSIS — I82409 Acute embolism and thrombosis of unspecified deep veins of unspecified lower extremity: Secondary | ICD-10-CM

## 2013-04-04 NOTE — Assessment & Plan Note (Signed)
Gradual, 20# in the past year-1-2 Ibs a month.

## 2013-04-04 NOTE — Progress Notes (Signed)
Patient ID: Erica [name], female   DOB: 1927/07/15, 80 y.o.   MRN: [id number]   Code Status: DNR  Allergies  Allergen Reactions  . Actonel [Risedronate Sodium]   . Darvon [Propoxyphene Hcl]   . Hydrocodone Nausea And Vomiting  . Morphine And Related Nausea And Vomiting  . Oxycodone Hcl   . Pneumovax [Pneumococcal Polysaccharides]     Chief Complaint  Patient presents with  . Medical Managment of Chronic Issues    HPI: Patient is a 80 y.o. female seen in the SNF at [facility] today for evaluation of chronic medical conditions.  Problem List Items Addressed This Visit   CVA (cerebral vascular accident) - Primary     Left parietal and occipital hemorrhagic stroke 11/2011. Right  hand grip strength is weaker--cannot make a tight fist even if muscle strength is 5/5. Expressive aphasia persisted.      Depression     Stable on Sertraline 75mg , Mirtazapine 7.5mg (sleeps well at night and weight has been stabilized), Ritalin down to 5mg  daily now.        DVT (deep venous thrombosis)     off Coumadin, takes Xarelto 20mg  daily.      Essential hypertension, benign     Controlled on Lisinopril 5mg  daily.       Hypertonicity of bladder     Stable on Oxybutynin 10mg  daily      Protein C deficiency     Protein S functional 28(69-129), total 43(60-150)--Coumadin discontinued and Xarelto 20mg  started.       Weight gain     Gradual, 20# in the past year-1-2 Ibs a month.        Review of Systems:  Review of Systems  Constitutional: Negative for fever, chills, weight loss, malaise/fatigue and diaphoresis.  HENT: Positive for hearing loss. Negative for congestion, ear discharge and sore throat.   Eyes: Negative for pain, discharge and redness.  Respiratory: Negative for cough, sputum production and wheezing.   Cardiovascular: Negative for chest pain, palpitations, orthopnea, claudication, leg swelling and PND.  Gastrointestinal: Negative for heartburn, nausea,  vomiting, abdominal pain, diarrhea, constipation and blood in stool.  Genitourinary: Positive for frequency. Negative for dysuria, urgency, hematuria and flank pain.  Musculoskeletal: Negative for back pain, falls, joint pain, myalgias and neck pain.  Skin: Negative for itching and rash.       The right mastectomy.   Neurological: Positive for speech change (expressive aphasia) and focal weakness (right hand is weaker than the left even if her grip strength is 5/5). Negative for dizziness, tingling, tremors, sensory change, seizures, loss of consciousness, weakness and headaches.  Endo/Heme/Allergies: Negative for environmental allergies and polydipsia. Does not bruise/bleed easily.  Psychiatric/Behavioral: Positive for memory loss. Negative for depression and hallucinations. The patient is not nervous/anxious and does not have insomnia (better).      Past Medical History  Diagnosis Date  . Hypertension   . Depression    Medications: Patient's Medications  New Prescriptions   No medications on file  Previous Medications   ACETAMINOPHEN (TYLENOL) 325 MG TABLET    Take 650 mg by mouth every 4 (four) hours as needed for pain.   ALUM & MAG HYDROXIDE-SIMETH (MAGIC MOUTHWASH) SOLN    Take 5 mLs by mouth 2 (two) times daily before a meal. Use before meals and at HS swish and swallow,prn for mouth sores.   ATORVASTATIN (LIPITOR) 10 MG TABLET    Take 10 mg by mouth daily.   BETA CAROTENE W/MINERALS (OCUVITE) TABLET  Take 1 tablet by mouth daily.   BETHANECHOL (URECHOLINE) 10 MG TABLET    Take 10 mg by mouth 3 (three) times daily.   CALCIUM CARBONATE (OS-CAL) 600 MG TABS    Take 600 mg by mouth 2 (two) times daily with a meal.   LISINOPRIL (PRINIVIL,ZESTRIL) 5 MG TABLET    Take 5 mg by mouth daily.   METHYLPHENIDATE (RITALIN) 5 MG TABLET    Take one tablet by mouth in the morning   MIRTAZAPINE (REMERON) 7.5 MG TABLET    Take 7.5 mg by mouth at bedtime and may repeat dose one time if needed.    RIVAROXABAN (XARELTO) 20 MG TABS TABLET    Take by mouth daily.   SERTRALINE (ZOLOFT) 25 MG TABLET    Take 75 mg by mouth daily.   SOLIFENACIN (VESICARE) 5 MG TABLET    Take 10 mg by mouth 2 (two) times daily.  Modified Medications   No medications on file  Discontinued Medications   No medications on file     Physical Exam: Physical Exam  Constitutional: She appears well-developed.  HENT:  Head: Normocephalic and atraumatic.  Left Ear: External ear normal.  Mouth/Throat: Oropharynx is clear and moist. Mucous membranes are not pale, not dry and not cyanotic. No oropharyngeal exudate.  Eyes: Conjunctivae and EOM are normal. Pupils are equal, round, and reactive to light.  Neck: Normal range of motion. Neck supple. No JVD present. No thyromegaly present.  Cardiovascular: Normal rate, regular rhythm and normal heart sounds.  Exam reveals no friction rub.   No murmur heard. Pulmonary/Chest: Effort normal and breath sounds normal. No respiratory distress. She has no wheezes. She has no rales.  Abdominal: Soft. Bowel sounds are normal. She exhibits no distension. There is no tenderness. There is no rebound and no guarding.  Genitourinary: Vagina normal.  Musculoskeletal: She exhibits no edema and no tenderness.       Right shoulder: She exhibits decreased strength.  Right sided paralysis with muscle strength 4/5  Lymphadenopathy:    She has no cervical adenopathy.  Neurological: She is alert. She displays abnormal reflex. No cranial nerve deficit. She exhibits abnormal muscle tone. Coordination abnormal.  Skin: Skin is warm and dry. No rash noted.  S/p right mastectomy.   Psychiatric: Her mood appears not anxious. Her affect is not angry, not blunt, not labile and not inappropriate. Her speech is delayed and tangential. Her speech is not rapid and/or pressured and not slurred. She is slowed. She is not agitated, not aggressive, not hyperactive, not withdrawn, not actively hallucinating and  not combative. Thought content is not paranoid and not delusional. Cognition and memory are impaired. She does not express impulsivity or inappropriate judgment. She does not exhibit a depressed mood. She is communicative. She exhibits abnormal recent memory. She exhibits normal remote memory. She is attentive.    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   Labs reviewed:  Basic Metabolic Panel:  Recent Labs  04/06/12 11/02/12  NA 138 142  K 2.9*  3.9 4.0  BUN 14 11  CREATININE 0.8 0.8  TSH 1.47  --    Liver Function Tests:  Recent Labs  04/06/12  11/02/12  AST 16 20  ALT 13 17  ALKPHOS 58 69   CBC:  Recent Labs  04/06/12 05/14/12 11/02/12  WBC 4.6 4.9 4.3  HGB 13.6 13.9 13.0  HCT 40 41 38  PLT 204 210 188    Assessment/Plan CVA (cerebral vascular accident) Left parietal and occipital hemorrhagic stroke 11/2011. Right  hand grip strength is weaker--cannot make a tight fist even if muscle strength is 5/5. Expressive aphasia persisted.    Depression Stable on Sertraline 75mg , Mirtazapine 7.5mg (sleeps well at night and weight has been stabilized), Ritalin down to 5mg  daily now.      Essential hypertension, benign Controlled on Lisinopril 5mg  daily.     DVT (deep venous thrombosis) off Coumadin, takes Xarelto 20mg  daily.    Hypertonicity of bladder Stable on Oxybutynin 10mg  daily    Weight gain Gradual, 20# in the past year-1-2 Ibs a month.   Protein C deficiency Protein S functional 28(69-129), total 43(60-150)--Coumadin discontinued and Xarelto 20mg  started.       Family/ Staff Communication: observe the patient  Goals  of Care: SNF  Labs/tests ordered: none

## 2013-04-04 NOTE — Assessment & Plan Note (Signed)
Protein S functional 28(69-129), total 43(60-150)--Coumadin discontinued and Xarelto 20mg started.     

## 2013-04-04 NOTE — Assessment & Plan Note (Signed)
Stable on Sertraline 75mg , Mirtazapine 7.5mg (sleeps well at night and weight has been stabilized), Ritalin down to 5mg  daily now.

## 2013-04-04 NOTE — Assessment & Plan Note (Signed)
Controlled on Lisinopril 5 mg daily  

## 2013-04-04 NOTE — Assessment & Plan Note (Signed)
Stable on Oxybutynin 10mg  daily

## 2013-04-04 NOTE — Assessment & Plan Note (Signed)
off Coumadin, takes Xarelto 20mg  daily.

## 2013-04-04 NOTE — Assessment & Plan Note (Signed)
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 Right  hand grip strength is weaker--cannot make a tight fist even if muscle strength is 5/5. Expressive aphasia persisted.

## 2013-05-04 ENCOUNTER — Encounter: Payer: Self-pay | Admitting: Nurse Practitioner

## 2013-05-04 ENCOUNTER — Non-Acute Institutional Stay: Payer: Medicare Other | Admitting: Nurse Practitioner

## 2013-05-04 DIAGNOSIS — F32A Depression, unspecified: Secondary | ICD-10-CM

## 2013-05-04 DIAGNOSIS — J069 Acute upper respiratory infection, unspecified: Secondary | ICD-10-CM

## 2013-05-04 DIAGNOSIS — N318 Other neuromuscular dysfunction of bladder: Secondary | ICD-10-CM

## 2013-05-04 DIAGNOSIS — D6859 Other primary thrombophilia: Secondary | ICD-10-CM

## 2013-05-04 DIAGNOSIS — F3289 Other specified depressive episodes: Secondary | ICD-10-CM

## 2013-05-04 DIAGNOSIS — I10 Essential (primary) hypertension: Secondary | ICD-10-CM

## 2013-05-04 DIAGNOSIS — F329 Major depressive disorder, single episode, unspecified: Secondary | ICD-10-CM

## 2013-05-04 DIAGNOSIS — G4700 Insomnia, unspecified: Secondary | ICD-10-CM

## 2013-05-04 NOTE — Assessment & Plan Note (Signed)
Protein S functional 28(69-129), total 43(60-150)--Coumadin discontinued and Xarelto 20mg started.     

## 2013-05-04 NOTE — Assessment & Plan Note (Signed)
Has been [place] since off Ritalin until last night-this may related to her URI. Will treat URI and observe the patient.

## 2013-05-04 NOTE — Progress Notes (Signed)
Patient ID: Erica [name], female   DOB: 1927-06-09, 80 y.o.   MRN: [id number]   Code Status: DNR  Allergies  Allergen Reactions  . Actonel [Risedronate Sodium]   . Darvon [Propoxyphene Hcl]   . Hydrocodone Nausea And Vomiting  . Morphine And Related Nausea And Vomiting  . Oxycodone Hcl   . Pneumovax [Pneumococcal Polysaccharides]     Chief Complaint  Patient presents with  . Medical Management of Chronic Issues  . Acute Visit    low grade T 100.2, head congestion.     HPI: Patient is a 80 y.o. female seen in the SNF at [facility] today for evaluation of low grade T, head congestion, malaise, and chronic medical conditions.  Problem List Items Addressed This Visit   Acute upper respiratory infections of unspecified site - Primary     C/o head congestion, cough, dull headaches, malaise, and unable to rest last night. Denied chest pain, palpitation, abd/CVA tenderness, or dysuria. T100.2. CXR, CBC, BMP, UA C/S pending. Prn Claritin and Robitussin available to her. Will treat with Augmentin 875mg  bid and Mucinex 600mg  bid x7 days. Flonase I each nostril bid x 10 days. Observe the patient.     Depression     Stable on Sertraline 75mg , Mirtazapine 7.5mg (sleeps well at night and weight has been stabilized), off Ritalin         Essential hypertension, benign     Controlled on Lisinopril 5mg  daily.       Hypertonicity of bladder     Stable off meds.       Insomnia     Has been [place] since off Ritalin until last night-this may related to her URI. Will treat URI and observe the patient.     Protein C deficiency     Protein S functional 28(69-129), total 43(60-150)--Coumadin discontinued and Xarelto 20mg  started.         Review of Systems:  Review of Systems  Constitutional: Negative for fever, chills, weight loss, malaise/fatigue and diaphoresis.  HENT: Positive for congestion, hearing loss and sore throat. Negative for ear discharge.   Eyes: Negative for pain,  discharge and redness.  Respiratory: Positive for cough. Negative for sputum production and wheezing.   Cardiovascular: Negative for chest pain, palpitations, orthopnea, claudication, leg swelling and PND.  Gastrointestinal: Negative for heartburn, nausea, vomiting, abdominal pain, diarrhea, constipation and blood in stool.  Genitourinary: Positive for frequency. Negative for dysuria, urgency, hematuria and flank pain.  Musculoskeletal: Negative for back pain, falls, joint pain, myalgias and neck pain.  Skin: Negative for itching and rash.       The right mastectomy.   Neurological: Positive for speech change (expressive aphasia), focal weakness (right hand is weaker than the left even if her grip strength is 5/5) and headaches. Negative for dizziness, tingling, tremors, sensory change, seizures, loss of consciousness and weakness.  Endo/Heme/Allergies: Negative for environmental allergies and polydipsia. Does not bruise/bleed easily.  Psychiatric/Behavioral: Positive for memory loss. Negative for depression and hallucinations. The patient has insomnia (better). The patient is not nervous/anxious.        Didn't sleep/rest well last night.      Past Medical History  Diagnosis Date  . Hypertension   . Depression    Medications: Patient's Medications  New Prescriptions   No medications on file  Previous Medications   ACETAMINOPHEN (TYLENOL) 325 MG TABLET    Take 650 mg by mouth every 4 (four) hours as needed for pain.   ALUM & MAG  HYDROXIDE-SIMETH (MAGIC MOUTHWASH) SOLN    Take 5 mLs by mouth 2 (two) times daily before a meal. Use before meals and at HS swish and swallow,prn for mouth sores.   ATORVASTATIN (LIPITOR) 10 MG TABLET    Take 10 mg by mouth daily.   BETA CAROTENE W/MINERALS (OCUVITE) TABLET    Take 1 tablet by mouth daily.   CALCIUM CARBONATE (OS-CAL) 600 MG TABS    Take 600 mg by mouth 2 (two) times daily with a meal.   LISINOPRIL (PRINIVIL,ZESTRIL) 5 MG TABLET    Take 5 mg by  mouth daily.   MIRTAZAPINE (REMERON) 7.5 MG TABLET    Take 7.5 mg by mouth at bedtime and may repeat dose one time if needed.   RIVAROXABAN (XARELTO) 20 MG TABS TABLET    Take by mouth daily.   SERTRALINE (ZOLOFT) 25 MG TABLET    Take 75 mg by mouth daily.  Modified Medications   No medications on file  Discontinued Medications   BETHANECHOL (URECHOLINE) 10 MG TABLET    Take 10 mg by mouth 3 (three) times daily.   METHYLPHENIDATE (RITALIN) 5 MG TABLET    Take one tablet by mouth in the morning   SOLIFENACIN (VESICARE) 5 MG TABLET    Take 10 mg by mouth 2 (two) times daily.     Physical Exam: Physical Exam  Constitutional: She appears well-developed.  HENT:  Head: Normocephalic and atraumatic.  Left Ear: External ear normal.  Mouth/Throat: Oropharynx is clear and moist. Mucous membranes are not pale, not dry and not cyanotic. No oropharyngeal exudate.  Nasal congestion, no facial pressure or tenderness  Eyes: Conjunctivae and EOM are normal. Pupils are equal, round, and reactive to light.  Neck: Normal range of motion. Neck supple. No JVD present. No thyromegaly present.  Cardiovascular: Normal rate, regular rhythm and normal heart sounds.  Exam reveals no friction rub.   No murmur heard. Pulmonary/Chest: Effort normal and breath sounds normal. No respiratory distress. She has no wheezes. She has no rales.  Central congestion.   Abdominal: Soft. Bowel sounds are normal. She exhibits no distension. There is no tenderness. There is no rebound and no guarding.  Genitourinary: Vagina normal.  Musculoskeletal: She exhibits no edema and no tenderness.       Right shoulder: She exhibits decreased strength.  Right sided paralysis with muscle strength 4/5  Lymphadenopathy:    She has no cervical adenopathy.  Neurological: She is alert. She displays abnormal reflex. No cranial nerve deficit. She exhibits abnormal muscle tone. Coordination abnormal.  Skin: Skin is warm and dry. No rash noted.    S/p right mastectomy.   Psychiatric: Her mood appears not anxious. Her affect is not angry, not blunt, not labile and not inappropriate. Her speech is delayed and tangential. Her speech is not rapid and/or pressured and not slurred. She is slowed. She is not agitated, not aggressive, not hyperactive, not withdrawn, not actively hallucinating and not combative. Thought content is not paranoid and not delusional. Cognition and memory are impaired. She does not express impulsivity or inappropriate judgment. She does not exhibit a depressed mood. She is communicative. She exhibits abnormal recent memory. She exhibits normal remote memory. She is attentive.    Filed Vitals:   05/04/13 1148  BP: 133/70  Pulse: 75  Temp: 100.2 F (37.9 C)  TempSrc: Tympanic  Resp: 20   Past Procedures:  01/10/13 X-ray L elbow/forearm/hand: mild diffuse osteopenia, mild osteoarthritis at the distal fingers with moderate  to advanced osteoarthritis at the lateral wrist, no acute fracture, subluxation, dislocation, or lytic destructive lesion.   Labs reviewed:  Basic Metabolic Panel:  Recent Labs  11/02/12  NA 142  K 4.0  BUN 11  CREATININE 0.8   Liver Function Tests:  Recent Labs  11/02/12  AST 20  ALT 17  ALKPHOS 69   CBC:  Recent Labs  05/14/12 11/02/12  WBC 4.9 4.3  HGB 13.9 13.0  HCT 41 38  PLT 210 188    Assessment/Plan Acute upper respiratory infections of unspecified site C/o head congestion, cough, dull headaches, malaise, and unable to rest last night. Denied chest pain, palpitation, abd/CVA tenderness, or dysuria. T100.2. CXR, CBC, BMP, UA C/S pending. Prn Claritin and Robitussin available to her. Will treat with Augmentin 875mg  bid and Mucinex 600mg  bid x7 days. Flonase I each nostril bid x 10 days. Observe the patient.   Depression Stable on Sertraline 75mg , Mirtazapine 7.5mg (sleeps well at night and weight has been stabilized), off Ritalin       Protein C  deficiency Protein S functional 28(69-129), total 43(60-150)--Coumadin discontinued and Xarelto 20mg  started.    Insomnia Has been [place] since off Ritalin until last night-this may related to her URI. Will treat URI and observe the patient.   Hypertonicity of bladder Stable off meds.     Essential hypertension, benign Controlled on Lisinopril 5mg  daily.       Family/ Staff Communication: observe the patient  Goals of Care: SNF  Labs/tests ordered: CBC, BMP, UA C/S, CXR pending.

## 2013-05-04 NOTE — Assessment & Plan Note (Signed)
Stable off meds. ?

## 2013-05-04 NOTE — Assessment & Plan Note (Signed)
C/o head congestion, cough, dull headaches, malaise, and unable to rest last night. Denied chest pain, palpitation, abd/CVA tenderness, or dysuria. T100.2. CXR, CBC, BMP, UA C/S pending. Prn Claritin and Robitussin available to her. Will treat with Augmentin 875mg  bid and Mucinex 600mg  bid x7 days. Flonase I each nostril bid x 10 days. Observe the patient.

## 2013-05-04 NOTE — Assessment & Plan Note (Signed)
Stable on Sertraline 75mg, Mirtazapine 7.5mg(sleeps well at night and weight has been stabilized), off Ritalin    

## 2013-05-04 NOTE — Assessment & Plan Note (Signed)
Controlled on Lisinopril 5 mg daily  

## 2013-08-24 ENCOUNTER — Non-Acute Institutional Stay: Payer: Medicare Other | Admitting: Nurse Practitioner

## 2013-08-24 ENCOUNTER — Encounter: Payer: Self-pay | Admitting: Nurse Practitioner

## 2013-08-24 DIAGNOSIS — H109 Unspecified conjunctivitis: Secondary | ICD-10-CM | POA: Insufficient documentation

## 2013-08-24 DIAGNOSIS — I10 Essential (primary) hypertension: Secondary | ICD-10-CM

## 2013-08-24 DIAGNOSIS — F32A Depression, unspecified: Secondary | ICD-10-CM

## 2013-08-24 DIAGNOSIS — F3289 Other specified depressive episodes: Secondary | ICD-10-CM

## 2013-08-24 DIAGNOSIS — F329 Major depressive disorder, single episode, unspecified: Secondary | ICD-10-CM

## 2013-08-24 NOTE — Assessment & Plan Note (Signed)
Stable on Sertraline 75mg , Mirtazapine 7.5mg (sleeps well at night and weight has been stabilized), off Ritalin

## 2013-08-24 NOTE — Progress Notes (Signed)
Patient ID: Erica [name], female   DOB: June 17, 1927, 80 y.o.   MRN: [id number]   Code Status: DNR  Allergies  Allergen Reactions  . Actonel [Risedronate Sodium]   . Darvon [Propoxyphene Hcl]   . Hydrocodone Nausea And Vomiting  . Morphine And Related Nausea And Vomiting  . Oxycodone Hcl   . Pneumovax [Pneumococcal Polysaccharide Vaccine]     Chief Complaint  Patient presents with  . Medical Management of Chronic Issues  . Acute Visit    itchign eyes.     HPI: Patient is a 80 y.o. female seen in the [facility] at [facility] today for evaluation of itching eyes and chronic medical conditions.  Problem List Items Addressed This Visit   Conjunctivitis unspecified - Primary     C/o itching eyes-L>R. The lateral of the R eye mild erythematous conjunctiva noted. Will try Naphcon A I gtt bid OU until healed.     Depression     Stable on Sertraline 75mg , Mirtazapine 7.5mg (sleeps well at night and weight has been stabilized), off Ritalin       Essential hypertension, benign     Controlled on Lisinopril 5mg  daily.          Review of Systems:  Review of Systems  Constitutional: Negative for fever, chills, weight loss, malaise/fatigue and diaphoresis.  HENT: Positive for hearing loss. Negative for congestion, ear discharge and sore throat.   Eyes: Positive for redness. Negative for pain and discharge.       Itching.  Respiratory: Negative for cough, sputum production and wheezing.   Cardiovascular: Negative for chest pain, palpitations, orthopnea, claudication, leg swelling and PND.  Gastrointestinal: Negative for heartburn, nausea, vomiting, abdominal pain, diarrhea, constipation and blood in stool.  Genitourinary: Positive for frequency. Negative for dysuria, urgency, hematuria and flank pain.  Musculoskeletal: Negative for back pain, falls, joint pain, myalgias and neck pain.  Skin: Negative for itching and rash.       The right mastectomy.   Neurological: Positive for  speech change (expressive aphasia) and focal weakness (right hand is weaker than the left even if her grip strength is 5/5). Negative for dizziness, tingling, tremors, sensory change, seizures, loss of consciousness, weakness and headaches.  Endo/Heme/Allergies: Negative for environmental allergies and polydipsia. Does not bruise/bleed easily.  Psychiatric/Behavioral: Positive for memory loss. Negative for depression and hallucinations. The patient has insomnia (better). The patient is not nervous/anxious.        Didn't sleep/rest well last night.      Past Medical History  Diagnosis Date  . Hypertension   . Depression    Medications: Patient's Medications  New Prescriptions   No medications on file  Previous Medications   ACETAMINOPHEN (TYLENOL) 325 MG TABLET    Take 650 mg by mouth every 4 (four) hours as needed for pain.   ALUM & MAG HYDROXIDE-SIMETH (MAGIC MOUTHWASH) SOLN    Take 5 mLs by mouth 2 (two) times daily before a meal. Use before meals and at HS swish and swallow,prn for mouth sores.   ATORVASTATIN (LIPITOR) 10 MG TABLET    Take 10 mg by mouth daily.   BETA CAROTENE W/MINERALS (OCUVITE) TABLET    Take 1 tablet by mouth daily.   CALCIUM CARB-CHOLECALCIFEROL (CALCIUM-VITAMIN D) 600-400 MG-UNIT TABS    Take by mouth 2 (two) times daily.   LISINOPRIL (PRINIVIL,ZESTRIL) 5 MG TABLET    Take 5 mg by mouth daily.   MIRTAZAPINE (REMERON) 7.5 MG TABLET    Take 7.5 mg  by mouth at bedtime and may repeat dose one time if needed.   RIVAROXABAN (XARELTO) 20 MG TABS TABLET    Take by mouth daily.   SERTRALINE (ZOLOFT) 25 MG TABLET    Take 75 mg by mouth daily.  Modified Medications   No medications on file  Discontinued Medications   No medications on file     Physical Exam: Physical Exam  Constitutional: She appears well-developed.  HENT:  Head: Normocephalic and atraumatic.  Left Ear: External ear normal.  Mouth/Throat: Oropharynx is clear and moist. Mucous membranes are not  pale, not dry and not cyanotic. No oropharyngeal exudate.  Eyes: Conjunctivae and EOM are normal. Pupils are equal, round, and reactive to light. Right eye exhibits no discharge. Left eye exhibits no discharge.  Itching eyes L>R. Mild injected R lateral conjunctiva.   Neck: Normal range of motion. Neck supple. No JVD present. No thyromegaly present.  Cardiovascular: Normal rate, regular rhythm and normal heart sounds.  Exam reveals no friction rub.   No murmur heard. Pulmonary/Chest: Effort normal and breath sounds normal. No respiratory distress. She has no wheezes. She has no rales.  Central congestion.   Abdominal: Soft. Bowel sounds are normal. She exhibits no distension. There is no tenderness. There is no rebound and no guarding.  Genitourinary: Vagina normal.  Musculoskeletal: She exhibits no edema and no tenderness.       Right shoulder: She exhibits decreased strength.  Right sided paralysis with muscle strength 4/5  Lymphadenopathy:    She has no cervical adenopathy.  Neurological: She is alert. She displays abnormal reflex. No cranial nerve deficit. She exhibits abnormal muscle tone. Coordination abnormal.  Skin: Skin is warm and dry. No rash noted.  S/p right mastectomy.   Psychiatric: Her mood appears not anxious. Her affect is not angry, not blunt, not labile and not inappropriate. Her speech is delayed and tangential. Her speech is not rapid and/or pressured and not slurred. She is slowed. She is not agitated, not aggressive, not hyperactive, not withdrawn, not actively hallucinating and not combative. Thought content is not paranoid and not delusional. Cognition and memory are impaired. She does not express impulsivity or inappropriate judgment. She does not exhibit a depressed mood. She is communicative. She exhibits abnormal recent memory. She exhibits normal remote memory. She is attentive.    Filed Vitals:   08/24/13 1020  BP: 118/68  Pulse: 74  Temp: 98 F (36.7 C)    TempSrc: Tympanic  Resp: 20   Past Procedures:  01/10/13 X-ray L elbow/forearm/hand: mild diffuse osteopenia, mild osteoarthritis at the distal fingers with moderate to advanced osteoarthritis at the lateral wrist, no acute fracture, subluxation, dislocation, or lytic destructive lesion.   Labs reviewed:  Basic Metabolic Panel:  Recent Labs  11/02/12  NA 142  K 4.0  BUN 11  CREATININE 0.8   Liver Function Tests:  Recent Labs  11/02/12  AST 20  ALT 17  ALKPHOS 69   CBC:  Recent Labs  11/02/12  WBC 4.3  HGB 13.0  HCT 38  PLT 188    Assessment/Plan Conjunctivitis unspecified C/o itching eyes-L>R. The lateral of the R eye mild erythematous conjunctiva noted. Will try Naphcon A I gtt bid OU until healed.   Depression Stable on Sertraline 75mg , Mirtazapine 7.5mg (sleeps well at night and weight has been stabilized), off Ritalin     Essential hypertension, benign Controlled on Lisinopril 5mg  daily.       Family/ Staff Communication: observe the patient  Goals of Care: AL  Labs/tests ordered: none

## 2013-08-24 NOTE — Assessment & Plan Note (Signed)
Controlled on Lisinopril 5 mg daily  

## 2013-08-24 NOTE — Assessment & Plan Note (Signed)
C/o itching eyes-L>R. The lateral of the R eye mild erythematous conjunctiva noted. Will try Naphcon A I gtt bid OU until healed.

## 2013-08-29 NOTE — Addendum Note (Signed)
Addended by: Dorothea Ogle X on: 08/29/2013 04:34 PM   Modules accepted: Level of Service

## 2013-09-19 ENCOUNTER — Other Ambulatory Visit: Payer: Self-pay | Admitting: Nurse Practitioner

## 2013-10-27 ENCOUNTER — Non-Acute Institutional Stay: Payer: Medicare Other | Admitting: Nurse Practitioner

## 2013-10-27 ENCOUNTER — Encounter: Payer: Self-pay | Admitting: Nurse Practitioner

## 2013-10-27 DIAGNOSIS — I10 Essential (primary) hypertension: Secondary | ICD-10-CM

## 2013-10-27 DIAGNOSIS — D6859 Other primary thrombophilia: Secondary | ICD-10-CM

## 2013-10-27 DIAGNOSIS — J069 Acute upper respiratory infection, unspecified: Secondary | ICD-10-CM

## 2013-10-27 DIAGNOSIS — F329 Major depressive disorder, single episode, unspecified: Secondary | ICD-10-CM

## 2013-10-27 DIAGNOSIS — F32A Depression, unspecified: Secondary | ICD-10-CM

## 2013-10-27 DIAGNOSIS — H109 Unspecified conjunctivitis: Secondary | ICD-10-CM

## 2013-10-27 NOTE — Progress Notes (Signed)
Patient ID: Erica [name], female   DOB: [birth year], 80 y.o.   MRN: [id number]   Code Status: DNR  Allergies  Allergen Reactions  . Actonel [Risedronate Sodium]   . Darvon [Propoxyphene Hcl]   . Hydrocodone Nausea And Vomiting  . Morphine And Related Nausea And Vomiting  . Oxycodone Hcl   . Pneumovax [Pneumococcal Polysaccharide Vaccine]     Chief Complaint  Patient presents with  . Medical Management of Chronic Issues  . Acute Visit    hacking cough, nasal congestion.     HPI: Patient is a 80 y.o. female seen in the SNF at [facility] today for evaluation of nasal congestion, hacking cough,  and other chronic medical conditions.  Problem List Items Addressed This Visit   Protein C deficiency     Protein S functional 28(69-129), total 43(60-150)--Coumadin discontinued and Xarelto 20mg  started.       Essential hypertension, benign     Dc Lisinopril to eliminate possible URI like symptoms. Start Metoprolol 12.5mg  bid. Monitor Bp/P daily.     Relevant Medications      metoprolol tartrate (LOPRESSOR) 25 MG tablet   Depression     Stable on Sertraline 75mg , Mirtazapine 7.5mg (sleeps well at night and weight has been stabilized), off Ritalin. Update CBC, CMP, TSH, Hgb A1c yearly.        Conjunctivitis - Primary     Resolved.     Acute upper respiratory infection     Recurrent. Dc Lisinopril to eliminate possible AR of URI like symptoms. Claritin 10mg  daily, Flonase 1 spray each nostril qd x 2 weeks. Observe the patient.        Review of Systems:  Review of Systems  Constitutional: Negative for fever, chills, weight loss, malaise/fatigue and diaphoresis.  HENT: Positive for congestion and hearing loss. Negative for ear discharge and sore throat.        Nasal congestion  Eyes: Negative for pain, discharge and redness.  Respiratory: Positive for cough. Negative for sputum production and wheezing.   Cardiovascular: Negative for chest pain, palpitations, orthopnea,  claudication, leg swelling and PND.  Gastrointestinal: Negative for heartburn, nausea, vomiting, abdominal pain, diarrhea, constipation and blood in stool.  Genitourinary: Positive for frequency. Negative for dysuria, urgency, hematuria and flank pain.  Musculoskeletal: Negative for back pain, falls, joint pain, myalgias and neck pain.  Skin: Negative for itching and rash.       The right mastectomy.   Neurological: Positive for speech change (expressive aphasia) and focal weakness (right hand is weaker than the left even if her grip strength is 5/5). Negative for dizziness, tingling, tremors, sensory change, seizures, loss of consciousness, weakness and headaches.  Endo/Heme/Allergies: Negative for environmental allergies and polydipsia. Does not bruise/bleed easily.  Psychiatric/Behavioral: Positive for memory loss. Negative for depression and hallucinations. The patient has insomnia (better). The patient is not nervous/anxious.        Didn't sleep/rest well last night.      Past Medical History  Diagnosis Date  . Hypertension   . Depression    History reviewed. No pertinent past surgical history. Social History:   has no tobacco, alcohol, and drug history on file.  No family history on file.  Medications: Patient's Medications  New Prescriptions   No medications on file  Previous Medications   ACETAMINOPHEN (TYLENOL) 325 MG TABLET    Take 650 mg by mouth every 4 (four) hours as needed for pain.   ATORVASTATIN (LIPITOR) 10 MG TABLET    Take  10 mg by mouth daily.   BETA CAROTENE W/MINERALS (OCUVITE) TABLET    Take 1 tablet by mouth daily.   CALCIUM CARB-CHOLECALCIFEROL (CALCIUM-VITAMIN D) 600-400 MG-UNIT TABS    Take by mouth 2 (two) times daily.   METOPROLOL TARTRATE (LOPRESSOR) 25 MG TABLET    Take 12.5 mg by mouth 2 (two) times daily.   MIRTAZAPINE (REMERON) 7.5 MG TABLET    Take 7.5 mg by mouth at bedtime and may repeat dose one time if needed.   RIVAROXABAN (XARELTO) 20 MG  TABS TABLET    Take by mouth daily.   SERTRALINE (ZOLOFT) 25 MG TABLET    Take 75 mg by mouth daily.  Modified Medications   No medications on file  Discontinued Medications   LISINOPRIL (PRINIVIL,ZESTRIL) 5 MG TABLET    Take 5 mg by mouth daily.     Physical Exam: Physical Exam  Constitutional: She appears well-developed.  HENT:  Head: Normocephalic and atraumatic.  Left Ear: External ear normal.  Nose: Mucosal edema and rhinorrhea present. Right sinus exhibits no maxillary sinus tenderness and no frontal sinus tenderness. Left sinus exhibits no maxillary sinus tenderness and no frontal sinus tenderness.  Mouth/Throat: Oropharynx is clear and moist. Mucous membranes are not pale, not dry and not cyanotic. No oropharyngeal exudate.  Eyes: Conjunctivae and EOM are normal. Pupils are equal, round, and reactive to light. Right eye exhibits no discharge. Left eye exhibits no discharge.  Neck: Normal range of motion. Neck supple. No JVD present. No thyromegaly present.  Cardiovascular: Normal rate, regular rhythm and normal heart sounds.  Exam reveals no friction rub.   No murmur heard. Pulmonary/Chest: Effort normal and breath sounds normal. No respiratory distress. She has no wheezes. She has no rales.  Central congestion.   Abdominal: Soft. Bowel sounds are normal. She exhibits no distension. There is no tenderness. There is no rebound and no guarding.  Genitourinary: Vagina normal.  Musculoskeletal: She exhibits no edema and no tenderness.       Right shoulder: She exhibits decreased strength.  Right sided paralysis with muscle strength 4/5  Lymphadenopathy:    She has no cervical adenopathy.  Neurological: She is alert. She displays abnormal reflex. No cranial nerve deficit. She exhibits abnormal muscle tone. Coordination abnormal.  Skin: Skin is warm and dry. No rash noted.  S/p right mastectomy.   Psychiatric: Her mood appears not anxious. Her affect is not angry, not blunt, not  labile and not inappropriate. Her speech is delayed and tangential. Her speech is not rapid and/or pressured and not slurred. She is slowed. She is not agitated, not aggressive, not hyperactive, not withdrawn, not actively hallucinating and not combative. Thought content is not paranoid and not delusional. Cognition and memory are impaired. She does not express impulsivity or inappropriate judgment. She does not exhibit a depressed mood. She is communicative. She exhibits abnormal recent memory. She exhibits normal remote memory. She is attentive.    Filed Vitals:   10/27/13 1557  BP: 104/58  Pulse: 78  Temp: 98.5 F (36.9 C)  TempSrc: Tympanic  Resp: 20      Labs reviewed: Basic Metabolic Panel:  Recent Labs  11/02/12  NA 142  K 4.0  BUN 11  CREATININE 0.8   Liver Function Tests:  Recent Labs  11/02/12  AST 20  ALT 17  ALKPHOS 69   No results found for this basename: LIPASE, AMYLASE,  in the last 8760 hours No results found for this basename: AMMONIA,  in the last 8760 hours CBC:  Recent Labs  11/02/12  WBC 4.3  HGB 13.0  HCT 38  PLT 188   Lipid Panel: No results found for this basename: CHOL, HDL, LDLCALC, TRIG, CHOLHDL, LDLDIRECT,  in the last 8760 hours  Past Procedures:     Assessment/Plan Conjunctivitis Resolved.   Acute upper respiratory infection Recurrent. Dc Lisinopril to eliminate possible AR of URI like symptoms. Claritin 10mg  daily, Flonase 1 spray each nostril qd x 2 weeks. Observe the patient.   Essential hypertension, benign Dc Lisinopril to eliminate possible URI like symptoms. Start Metoprolol 12.5mg  bid. Monitor Bp/P daily.   Depression Stable on Sertraline 75mg , Mirtazapine 7.5mg (sleeps well at night and weight has been stabilized), off Ritalin. Update CBC, CMP, TSH, Hgb A1c yearly.      Protein C deficiency Protein S functional 28(69-129), total 43(60-150)--Coumadin discontinued and Xarelto 20mg  started.       Family/  Staff Communication: observe the patient  Goals of Care: SNF  Labs/tests ordered: CBC, CMP, Hgb A1c, TSH April 2016

## 2013-10-27 NOTE — Assessment & Plan Note (Signed)
Recurrent. Dc Lisinopril to eliminate possible AR of URI like symptoms. Claritin 10mg  daily, Flonase 1 spray each nostril qd x 2 weeks. Observe the patient.

## 2013-10-27 NOTE — Assessment & Plan Note (Signed)
Resolved

## 2013-10-27 NOTE — Assessment & Plan Note (Signed)
Protein S functional 28(69-129), total 43(60-150)--Coumadin discontinued and Xarelto 20mg  started.

## 2013-10-27 NOTE — Assessment & Plan Note (Signed)
Stable on Sertraline 75mg , Mirtazapine 7.5mg (sleeps well at night and weight has been stabilized), off Ritalin. Update CBC, CMP, TSH, Hgb A1c yearly.

## 2013-10-27 NOTE — Assessment & Plan Note (Signed)
Dc Lisinopril to eliminate possible URI like symptoms. Start Metoprolol 12.5mg  bid. Monitor Bp/P daily.

## 2013-11-10 ENCOUNTER — Non-Acute Institutional Stay: Payer: Medicare Other | Admitting: Nurse Practitioner

## 2013-11-10 ENCOUNTER — Encounter: Payer: Self-pay | Admitting: Nurse Practitioner

## 2013-11-10 DIAGNOSIS — F329 Major depressive disorder, single episode, unspecified: Secondary | ICD-10-CM

## 2013-11-10 DIAGNOSIS — F32A Depression, unspecified: Secondary | ICD-10-CM

## 2013-11-10 DIAGNOSIS — H109 Unspecified conjunctivitis: Secondary | ICD-10-CM

## 2013-11-10 DIAGNOSIS — M85432 Solitary bone cyst, left ulna and radius: Secondary | ICD-10-CM

## 2013-11-10 DIAGNOSIS — D6859 Other primary thrombophilia: Secondary | ICD-10-CM

## 2013-11-10 DIAGNOSIS — J069 Acute upper respiratory infection, unspecified: Secondary | ICD-10-CM

## 2013-11-10 DIAGNOSIS — I10 Essential (primary) hypertension: Secondary | ICD-10-CM

## 2013-11-10 DIAGNOSIS — C4442 Squamous cell carcinoma of skin of scalp and neck: Secondary | ICD-10-CM | POA: Insufficient documentation

## 2013-11-10 DIAGNOSIS — B372 Candidiasis of skin and nail: Secondary | ICD-10-CM | POA: Insufficient documentation

## 2013-11-10 NOTE — Assessment & Plan Note (Signed)
A >1cm skin lesion with rough surface,  various shade of brown color, and irregular border represent [facility] dermatology consultation.

## 2013-11-10 NOTE — Assessment & Plan Note (Signed)
off Lisinopril to eliminate possible URI like symptoms. Controlled, takes  Metoprolol 12.5mg  bid. Monitor Bp/P daily.

## 2013-11-10 NOTE — Progress Notes (Signed)
Patient ID: Erica [name], female   DOB: Dec 05, 1927, 80 y.o.   MRN: [id number]   Code Status: DNR  Allergies  Allergen Reactions  . Actonel [Risedronate Sodium]   . Darvon [Propoxyphene Hcl]   . Hydrocodone Nausea And Vomiting  . Morphine And Related Nausea And Vomiting  . Oxycodone Hcl   . Pneumovax [Pneumococcal Polysaccharide Vaccine]     Chief Complaint  Patient presents with  . Medical Management of Chronic Issues  . Acute Visit    under left breast skin rash, left forearm nodule, left mandicular skin lesion    HPI: Patient is a 80 y.o. female seen in the SNF at [facility] today for evaluation of under left breast skin fold rash, left forearm nodule, left mandibular area skin lesion,  and other chronic medical conditions.  Problem List Items Addressed This Visit    Solitary bone cyst of left forearm    A small less than 1cm smooth mobile nose tip consistency nodule palpated mid left fore arm. Observe.      SCC (squamous cell carcinoma), scalp/neck    A >1cm skin lesion with rough surface,  various shade of brown color, and irregular border represent [facility] dermatology consultation.      Protein C deficiency    Protein S functional 28(69-129), total 43(60-150)--Coumadin discontinued and Xarelto 20mg  started.        Essential hypertension, benign    off Lisinopril to eliminate possible URI like symptoms. Controlled, takes  Metoprolol 12.5mg  bid. Monitor Bp/P daily.      Depression    Stable on Sertraline 75mg , Mirtazapine 7.5mg (sleeps well at night and weight has been stabilized), off Ritalin. Update CBC, CMP, TSH, Hgb A1c yearly.         Conjunctivitis    Healed.     Candidiasis of skin - Primary    Under left breast skin fold rash-satellite pattern. Mycolog II cream bid and place a dry towel in the area until healed.      Acute upper respiratory infection    Recurrent. Symptoms are better since Dc Lisinopril to eliminate possible AR of URI  like symptoms and Claritin 10mg  daily, Flonase 1 spray each nostril qd x 2 weeks. Observe the patient.        Review of Systems:  Review of Systems  Constitutional: Negative for fever, chills, weight loss, malaise/fatigue and diaphoresis.  HENT: Positive for congestion and hearing loss. Negative for ear discharge and sore throat.        Nasal congestion  Eyes: Negative for pain, discharge and redness.  Respiratory: Positive for cough. Negative for sputum production and wheezing.   Cardiovascular: Negative for chest pain, palpitations, orthopnea, claudication, leg swelling and PND.  Gastrointestinal: Negative for heartburn, nausea, vomiting, abdominal pain, diarrhea, constipation and blood in stool.  Genitourinary: Positive for frequency. Negative for dysuria, urgency, hematuria and flank pain.  Musculoskeletal: Negative for myalgias, back pain, joint pain, falls and neck pain.  Skin: Negative for itching and rash.       The right mastectomy. Under left breast skin fold rash-satellite pattern. A small less than 1cm smooth mobile nose tip consistency nodule palpated mid left fore arm. A >1cm skin lesion with rough surface,  various shade of brown color, and irregular border represent [facility] dermatology consultation.   Neurological: Positive for speech change (expressive aphasia) and focal weakness (right hand is weaker than the left even if her grip strength is 5/5). Negative for dizziness, tingling, tremors, sensory change, seizures, loss  of consciousness, weakness and headaches.  Endo/Heme/Allergies: Negative for environmental allergies and polydipsia. Does not bruise/bleed easily.  Psychiatric/Behavioral: Positive for memory loss. Negative for depression and hallucinations. The patient has insomnia (better). The patient is not nervous/anxious.        Didn't sleep/rest well last night.      Past Medical History  Diagnosis Date  . Hypertension   . Depression    History reviewed. No  pertinent past surgical history. Social History:   has no tobacco, alcohol, and drug history on file.  No family history on file.  Medications: Patient's Medications  New Prescriptions   No medications on file  Previous Medications   ACETAMINOPHEN (TYLENOL) 325 MG TABLET    Take 650 mg by mouth every 4 (four) hours as needed for pain.   ATORVASTATIN (LIPITOR) 10 MG TABLET    Take 10 mg by mouth daily.   BETA CAROTENE W/MINERALS (OCUVITE) TABLET    Take 1 tablet by mouth daily.   CALCIUM CARB-CHOLECALCIFEROL (CALCIUM-VITAMIN D) 600-400 MG-UNIT TABS    Take by mouth 2 (two) times daily.   METOPROLOL TARTRATE (LOPRESSOR) 25 MG TABLET    Take 12.5 mg by mouth 2 (two) times daily.   MIRTAZAPINE (REMERON) 7.5 MG TABLET    Take 7.5 mg by mouth at bedtime and may repeat dose one time if needed.   RIVAROXABAN (XARELTO) 20 MG TABS TABLET    Take by mouth daily.   SERTRALINE (ZOLOFT) 25 MG TABLET    Take 75 mg by mouth daily.  Modified Medications   No medications on file  Discontinued Medications   No medications on file     Physical Exam: Physical Exam  Constitutional: She appears well-developed.  HENT:  Head: Normocephalic and atraumatic.  Left Ear: External ear normal.  Nose: Mucosal edema and rhinorrhea present. Right sinus exhibits no maxillary sinus tenderness and no frontal sinus tenderness. Left sinus exhibits no maxillary sinus tenderness and no frontal sinus tenderness.  Mouth/Throat: Oropharynx is clear and moist. Mucous membranes are not pale, not dry and not cyanotic. No oropharyngeal exudate.  Eyes: Conjunctivae and EOM are normal. Pupils are equal, round, and reactive to light. Right eye exhibits no discharge. Left eye exhibits no discharge.  Neck: Normal range of motion. Neck supple. No JVD present. No thyromegaly present.  Cardiovascular: Normal rate, regular rhythm and normal heart sounds.  Exam reveals no friction rub.   No murmur heard. Pulmonary/Chest: Effort normal  and breath sounds normal. No respiratory distress. She has no wheezes. She has no rales.  Central congestion.   Abdominal: Soft. Bowel sounds are normal. She exhibits no distension. There is no tenderness. There is no rebound and no guarding.  Genitourinary: Vagina normal.  Musculoskeletal: She exhibits no edema or tenderness.       Right shoulder: She exhibits decreased strength.  Right sided paralysis with muscle strength 4/5  Lymphadenopathy:    She has no cervical adenopathy.  Neurological: She is alert. She displays abnormal reflex. No cranial nerve deficit. She exhibits abnormal muscle tone. Coordination abnormal.  Skin: Skin is warm and dry. No rash noted.  S/p right mastectomy. The right mastectomy. Under left breast skin fold rash-satellite pattern. A small less than 1cm smooth mobile nose tip consistency nodule palpated mid left fore arm. A >1cm skin lesion with rough surface,  various shade of brown color, and irregular border represent [facility] dermatology consultation.     Psychiatric: Her mood appears not anxious. Her affect is not  angry, not blunt, not labile and not inappropriate. Her speech is delayed and tangential. Her speech is not rapid and/or pressured and not slurred. She is slowed. She is not agitated, not aggressive, not hyperactive, not withdrawn, not actively hallucinating and not combative. Thought content is not paranoid and not delusional. Cognition and memory are impaired. She does not express impulsivity or inappropriate judgment. She does not exhibit a depressed mood. She is communicative. She exhibits abnormal recent memory. She exhibits normal remote memory. She is attentive.    Filed Vitals:   11/10/13 1125  BP: 121/66  Pulse: 79  Temp: 98.9 F (37.2 C)  TempSrc: Tympanic  Resp: 19      Labs reviewed: Basic Metabolic Panel: No results for input(s): NA, K, CL, CO2, GLUCOSE, BUN, CREATININE, CALCIUM, MG, PHOS, TSH in the last 8760 hours. Liver  Function Tests: No results for input(s): AST, ALT, ALKPHOS, BILITOT, PROT, ALBUMIN in the last 8760 hours. No results for input(s): LIPASE, AMYLASE in the last 8760 hours. No results for input(s): AMMONIA in the last 8760 hours. CBC: No results for input(s): WBC, NEUTROABS, HGB, HCT, MCV, PLT in the last 8760 hours. Lipid Panel: No results for input(s): CHOL, HDL, LDLCALC, TRIG, CHOLHDL, LDLDIRECT in the last 8760 hours.  Past Procedures:  05/04/13 CXR no cardiomegaly, mild pulmonary vascular congestion, no pleural effusion, no inflammatory consolidate or suspicious nodule, mild bibasilar atelectasis.    Assessment/Plan Candidiasis of skin Under left breast skin fold rash-satellite pattern. Mycolog II cream bid and place a dry towel in the area until healed.    Solitary bone cyst of left forearm A small less than 1cm smooth mobile nose tip consistency nodule palpated mid left fore arm. Observe.    SCC (squamous cell carcinoma), scalp/neck A >1cm skin lesion with rough surface,  various shade of brown color, and irregular border represent [facility] dermatology consultation.    Acute upper respiratory infection Recurrent. Symptoms are better since Dc Lisinopril to eliminate possible AR of URI like symptoms and Claritin 10mg  daily, Flonase 1 spray each nostril qd x 2 weeks. Observe the patient.   Conjunctivitis Healed.   Depression Stable on Sertraline 75mg , Mirtazapine 7.5mg (sleeps well at night and weight has been stabilized), off Ritalin. Update CBC, CMP, TSH, Hgb A1c yearly.       Essential hypertension, benign off Lisinopril to eliminate possible URI like symptoms. Controlled, takes  Metoprolol 12.5mg  bid. Monitor Bp/P daily.    Protein C deficiency Protein S functional 28(69-129), total 43(60-150)--Coumadin discontinued and Xarelto 20mg  started.        Family/ Staff Communication: observe the patient  Goals of Care: [place]  Labs/tests ordered: none

## 2013-11-10 NOTE — Assessment & Plan Note (Signed)
Recurrent. Symptoms are better since Dc Lisinopril to eliminate possible AR of URI like symptoms and Claritin 10mg  daily, Flonase 1 spray each nostril qd x 2 weeks. Observe the patient.

## 2013-11-10 NOTE — Assessment & Plan Note (Signed)
Under left breast skin fold rash-satellite pattern. Mycolog II cream bid and place a dry towel in the area until healed.

## 2013-11-10 NOTE — Assessment & Plan Note (Signed)
Stable on Sertraline 75mg , Mirtazapine 7.5mg (sleeps well at night and weight has been stabilized), off Ritalin. Update CBC, CMP, TSH, Hgb A1c yearly.

## 2013-11-10 NOTE — Assessment & Plan Note (Signed)
Protein S functional 28(69-129), total 43(60-150)--Coumadin discontinued and Xarelto 20mg  started.

## 2013-11-10 NOTE — Assessment & Plan Note (Signed)
Healed

## 2013-11-10 NOTE — Assessment & Plan Note (Signed)
A small less than 1cm smooth mobile nose tip consistency nodule palpated mid left fore arm. Observe.

## 2013-11-30 ENCOUNTER — Non-Acute Institutional Stay: Payer: Medicare Other | Admitting: Nurse Practitioner

## 2013-11-30 ENCOUNTER — Encounter: Payer: Self-pay | Admitting: Nurse Practitioner

## 2013-11-30 DIAGNOSIS — J069 Acute upper respiratory infection, unspecified: Secondary | ICD-10-CM

## 2013-11-30 DIAGNOSIS — I10 Essential (primary) hypertension: Secondary | ICD-10-CM

## 2013-11-30 DIAGNOSIS — F32A Depression, unspecified: Secondary | ICD-10-CM

## 2013-11-30 DIAGNOSIS — D6859 Other primary thrombophilia: Secondary | ICD-10-CM

## 2013-11-30 DIAGNOSIS — J329 Chronic sinusitis, unspecified: Secondary | ICD-10-CM | POA: Insufficient documentation

## 2013-11-30 DIAGNOSIS — F329 Major depressive disorder, single episode, unspecified: Secondary | ICD-10-CM

## 2013-11-30 DIAGNOSIS — J321 Chronic frontal sinusitis: Secondary | ICD-10-CM

## 2013-11-30 DIAGNOSIS — H109 Unspecified conjunctivitis: Secondary | ICD-10-CM

## 2013-11-30 NOTE — Assessment & Plan Note (Signed)
Recurrent. Symptoms are better since Dc Lisinopril to eliminate possible AR of URI like symptoms and Claritin 10mg  daily, Flonase 1 spray each nostril qd x 2 weeks. Observe the patient.   11/30/13 only partial symptomatic relieved then relapsed. Lower grade T, cough, nasal congestion, forehead pain/pressure.

## 2013-11-30 NOTE — Assessment & Plan Note (Signed)
Healed

## 2013-11-30 NOTE — Assessment & Plan Note (Signed)
Stable on Sertraline 75mg , Mirtazapine 7.5mg (sleeps well at night and weight has been stabilized), off Ritalin. Update CBC, CMP, TSH, Hgb A1c yearly.

## 2013-11-30 NOTE — Assessment & Plan Note (Signed)
Malaise, lower grade T, forehead aches/pressure w/o change of vision or focal neurological deficit, cough-not better after Claritin/Flonse 2 weeks. CXR 11/29/13 no definite focal pulmonary infiltrate or pleural effusion. Levaquin 500mg  daily x 10days, FloraStor bid x 10 days. Flonase each nostril daily. May consider Maxillary CT scan and ENT consult if no better.

## 2013-11-30 NOTE — Assessment & Plan Note (Signed)
off Lisinopril to eliminate possible URI like symptoms-persisted chronic cough. Controlled, takes  Metoprolol 12.5mg  bid. Monitor Bp/P daily.

## 2013-11-30 NOTE — Assessment & Plan Note (Signed)
Protein S functional 28(69-129), total 43(60-150)--Coumadin discontinued and Xarelto 20mg  started.

## 2013-11-30 NOTE — Progress Notes (Signed)
Patient ID: Erica [name], female   DOB: 1928-01-06, 80 y.o.   MRN: [id number]   Code Status: DNR  Allergies  Allergen Reactions  . Actonel [Risedronate Sodium]   . Darvon [Propoxyphene Hcl]   . Hydrocodone Nausea And Vomiting  . Morphine And Related Nausea And Vomiting  . Oxycodone Hcl   . Pneumovax [Pneumococcal Polysaccharide Vaccine]     Chief Complaint  Patient presents with  . Medical Management of Chronic Issues  . Acute Visit    cough, low grade T, front head pressure, nasal congestion.     HPI: Patient is a 80 y.o. female seen in the [facility] at [facility] today for evaluation of nasal congestion, forehead pressure/dull pain, low grade T, cough,  and other chronic medical conditions.  Problem List Items Addressed This Visit    Sinusitis, chronic - Primary    Malaise, lower grade T, forehead aches/pressure w/o change of vision or focal neurological deficit, cough-not better after Claritin/Flonse 2 weeks. CXR 11/29/13 no definite focal pulmonary infiltrate or pleural effusion. Levaquin 500mg  daily x 10days, FloraStor bid x 10 days. Flonase each nostril daily. May consider Maxillary CT scan and ENT consult if no better.     Protein C deficiency    Protein S functional 28(69-129), total 43(60-150)--Coumadin discontinued and Xarelto 20mg  started.        Essential hypertension, benign    off Lisinopril to eliminate possible URI like symptoms-persisted chronic cough. Controlled, takes  Metoprolol 12.5mg  bid. Monitor Bp/P daily.       Depression    Stable on Sertraline 75mg , Mirtazapine 7.5mg (sleeps well at night and weight has been stabilized), off Ritalin. Update CBC, CMP, TSH, Hgb A1c yearly.          Conjunctivitis    Healed.     Acute upper respiratory infection    Recurrent. Symptoms are better since Dc Lisinopril to eliminate possible AR of URI like symptoms and Claritin 10mg  daily, Flonase 1 spray each nostril qd x 2 weeks. Observe the patient.    11/30/13 only partial symptomatic relieved then relapsed. Lower grade T, cough, nasal congestion, forehead pain/pressure.        Review of Systems:  Review of Systems  Constitutional: Positive for fever and malaise/fatigue. Negative for chills, weight loss and diaphoresis.  HENT: Positive for congestion and hearing loss. Negative for ear discharge and sore throat.        Nasal congestion and forehead pressure/dull aches.   Eyes: Negative for pain, discharge and redness.  Respiratory: Positive for cough. Negative for sputum production and wheezing.   Cardiovascular: Negative for chest pain, palpitations, orthopnea, claudication, leg swelling and PND.  Gastrointestinal: Negative for heartburn, nausea, vomiting, abdominal pain, diarrhea, constipation and blood in stool.  Genitourinary: Positive for frequency. Negative for dysuria, urgency, hematuria and flank pain.  Musculoskeletal: Negative for myalgias, back pain, joint pain, falls and neck pain.  Skin: Negative for itching and rash.       The right mastectomy.  A small less than 1cm smooth mobile nose tip consistency nodule palpated mid left fore arm. A >1cm skin lesion with rough surface,  various shade of brown color, and irregular border represent [facility] dermatology consultation.   Neurological: Positive for speech change (expressive aphasia) and focal weakness (right hand is weaker than the left even if her grip strength is 5/5). Negative for dizziness, tingling, tremors, sensory change, seizures, loss of consciousness, weakness and headaches.  Endo/Heme/Allergies: Negative for environmental allergies and polydipsia. Does not bruise/bleed  easily.  Psychiatric/Behavioral: Positive for memory loss. Negative for depression and hallucinations. The patient has insomnia (better). The patient is not nervous/anxious.        Didn't sleep/rest well last night.      Past Medical History  Diagnosis Date  . Hypertension   . Depression     History reviewed. No pertinent past surgical history. Social History:   has no tobacco, alcohol, and drug history on file.  No family history on file.  Medications: Patient's Medications  New Prescriptions   No medications on file  Previous Medications   ACETAMINOPHEN (TYLENOL) 325 MG TABLET    Take 650 mg by mouth every 4 (four) hours as needed for pain.   ATORVASTATIN (LIPITOR) 10 MG TABLET    Take 10 mg by mouth daily.   BETA CAROTENE W/MINERALS (OCUVITE) TABLET    Take 1 tablet by mouth daily.   CALCIUM CARB-CHOLECALCIFEROL (CALCIUM-VITAMIN D) 600-400 MG-UNIT TABS    Take by mouth 2 (two) times daily.   METOPROLOL TARTRATE (LOPRESSOR) 25 MG TABLET    Take 12.5 mg by mouth 2 (two) times daily.   MIRTAZAPINE (REMERON) 7.5 MG TABLET    Take 7.5 mg by mouth at bedtime and may repeat dose one time if needed.   RIVAROXABAN (XARELTO) 20 MG TABS TABLET    Take by mouth daily.   SERTRALINE (ZOLOFT) 25 MG TABLET    Take 75 mg by mouth daily.  Modified Medications   No medications on file  Discontinued Medications   No medications on file     Physical Exam: Physical Exam  Constitutional: She appears well-developed.  HENT:  Head: Normocephalic and atraumatic.  Left Ear: External ear normal.  Nose: Mucosal edema and rhinorrhea present. Right sinus exhibits no maxillary sinus tenderness and no frontal sinus tenderness. Left sinus exhibits no maxillary sinus tenderness and no frontal sinus tenderness.  Mouth/Throat: Oropharynx is clear and moist. Mucous membranes are not pale, not dry and not cyanotic. No oropharyngeal exudate.  Forehead pressure.   Eyes: Conjunctivae and EOM are normal. Pupils are equal, round, and reactive to light. Right eye exhibits no discharge. Left eye exhibits no discharge.  Neck: Normal range of motion. Neck supple. No JVD present. No thyromegaly present.  Cardiovascular: Normal rate, regular rhythm and normal heart sounds.  Exam reveals no friction rub.   No  murmur heard. Pulmonary/Chest: Effort normal and breath sounds normal. No respiratory distress. She has no wheezes. She has no rales.  Central congestion.   Abdominal: Soft. Bowel sounds are normal. She exhibits no distension. There is no tenderness. There is no rebound and no guarding.  Genitourinary: Vagina normal.  Musculoskeletal: She exhibits no edema or tenderness.       Right shoulder: She exhibits decreased strength.  Right sided paralysis with muscle strength 4/5  Lymphadenopathy:    She has no cervical adenopathy.  Neurological: She is alert. She displays abnormal reflex. No cranial nerve deficit. She exhibits abnormal muscle tone. Coordination abnormal.  Skin: Skin is warm and dry. No rash noted.  S/p right mastectomy. The right mastectomy. Under left breast skin fold rash-satellite pattern. A small less than 1cm smooth mobile nose tip consistency nodule palpated mid left fore arm. A >1cm skin lesion with rough surface,  various shade of brown color, and irregular border represent [facility] dermatology consultation.     Psychiatric: Her mood appears not anxious. Her affect is not angry, not blunt, not labile and not inappropriate. Her speech is delayed  and tangential. Her speech is not rapid and/or pressured and not slurred. She is slowed. She is not agitated, not aggressive, not hyperactive, not withdrawn, not actively hallucinating and not combative. Thought content is not paranoid and not delusional. Cognition and memory are impaired. She does not express impulsivity or inappropriate judgment. She does not exhibit a depressed mood. She is communicative. She exhibits abnormal recent memory. She exhibits normal remote memory. She is attentive.    Filed Vitals:   11/30/13 1038  BP: 118/70  Pulse: 70  Temp: 99.3 F (37.4 C)  TempSrc: Tympanic  Resp: 20      Labs reviewed: Basic Metabolic Panel: No results for input(s): NA, K, CL, CO2, GLUCOSE, BUN, CREATININE, CALCIUM, MG,  PHOS, TSH in the last 8760 hours. Liver Function Tests: No results for input(s): AST, ALT, ALKPHOS, BILITOT, PROT, ALBUMIN in the last 8760 hours. No results for input(s): LIPASE, AMYLASE in the last 8760 hours. No results for input(s): AMMONIA in the last 8760 hours. CBC: No results for input(s): WBC, NEUTROABS, HGB, HCT, MCV, PLT in the last 8760 hours. Lipid Panel: No results for input(s): CHOL, HDL, LDLCALC, TRIG, CHOLHDL, LDLDIRECT in the last 8760 hours.  Past Procedures:  05/04/13 CXR no cardiomegaly, mild pulmonary vascular congestion, no pleural effusion, no inflammatory consolidate or suspicious nodule, mild bibasilar atelectasis.   11/29/13 CXR mild bibasilar atelectasis with no definite focal pulmonary infiltrate or pleural effusion, borderline to minimal cardiomegaly with no definite overt finders of congestive heart failure.    Assessment/Plan Sinusitis, chronic Malaise, lower grade T, forehead aches/pressure w/o change of vision or focal neurological deficit, cough-not better after Claritin/Flonse 2 weeks. CXR 11/29/13 no definite focal pulmonary infiltrate or pleural effusion. Levaquin 500mg  daily x 10days, FloraStor bid x 10 days. Flonase each nostril daily. May consider Maxillary CT scan and ENT consult if no better.   Acute upper respiratory infection Recurrent. Symptoms are better since Dc Lisinopril to eliminate possible AR of URI like symptoms and Claritin 10mg  daily, Flonase 1 spray each nostril qd x 2 weeks. Observe the patient.   11/30/13 only partial symptomatic relieved then relapsed. Lower grade T, cough, nasal congestion, forehead pain/pressure.   Conjunctivitis Healed.   Depression Stable on Sertraline 75mg , Mirtazapine 7.5mg (sleeps well at night and weight has been stabilized), off Ritalin. Update CBC, CMP, TSH, Hgb A1c yearly.        Essential hypertension, benign off Lisinopril to eliminate possible URI like symptoms-persisted chronic cough.  Controlled, takes  Metoprolol 12.5mg  bid. Monitor Bp/P daily.     Protein C deficiency Protein S functional 28(69-129), total 43(60-150)--Coumadin discontinued and Xarelto 20mg  started.        Family/ Staff Communication: observe the patient  Goals of Care: AL  Labs/tests ordered: none

## 2013-12-12 LAB — HM MAMMOGRAPHY

## 2013-12-13 ENCOUNTER — Encounter: Payer: Self-pay | Admitting: *Deleted

## 2013-12-15 ENCOUNTER — Encounter: Payer: Self-pay | Admitting: Nurse Practitioner

## 2013-12-15 DIAGNOSIS — N6320 Unspecified lump in the left breast, unspecified quadrant: Secondary | ICD-10-CM | POA: Insufficient documentation

## 2013-12-21 ENCOUNTER — Encounter: Payer: Self-pay | Admitting: Internal Medicine

## 2014-01-03 ENCOUNTER — Encounter: Payer: Self-pay | Admitting: Internal Medicine

## 2014-02-14 ENCOUNTER — Encounter: Payer: Self-pay | Admitting: Internal Medicine

## 2014-04-11 LAB — BASIC METABOLIC PANEL
BUN: 13 mg/dL (ref 4–21)
Creatinine: 1 mg/dL (ref 0.5–1.1)
Glucose: 75 mg/dL
Potassium: 3.9 mmol/L (ref 3.4–5.3)
Sodium: 142 mmol/L (ref 137–147)

## 2014-04-11 LAB — CBC AND DIFFERENTIAL
HCT: 42 % (ref 36–46)
HEMOGLOBIN: 14 g/dL (ref 12.0–16.0)
PLATELETS: 204 10*3/uL (ref 150–399)
WBC: 5.7 10*3/mL

## 2014-04-11 LAB — HEPATIC FUNCTION PANEL
ALT: 13 U/L (ref 7–35)
AST: 17 U/L (ref 13–35)
Alkaline Phosphatase: 86 U/L (ref 25–125)
BILIRUBIN, TOTAL: 0.3 mg/dL

## 2014-04-11 LAB — TSH: TSH: 1.9 u[IU]/mL (ref 0.41–5.90)

## 2014-04-11 LAB — HEMOGLOBIN A1C: HEMOGLOBIN A1C: 5.7 % (ref 4.0–6.0)

## 2014-04-13 ENCOUNTER — Other Ambulatory Visit: Payer: Self-pay | Admitting: Nurse Practitioner

## 2014-04-20 ENCOUNTER — Non-Acute Institutional Stay: Payer: Medicare Other | Admitting: Nurse Practitioner

## 2014-04-20 ENCOUNTER — Encounter: Payer: Self-pay | Admitting: Nurse Practitioner

## 2014-04-20 DIAGNOSIS — I82402 Acute embolism and thrombosis of unspecified deep veins of left lower extremity: Secondary | ICD-10-CM

## 2014-04-20 DIAGNOSIS — F329 Major depressive disorder, single episode, unspecified: Secondary | ICD-10-CM

## 2014-04-20 DIAGNOSIS — I10 Essential (primary) hypertension: Secondary | ICD-10-CM | POA: Diagnosis not present

## 2014-04-20 DIAGNOSIS — M2662 Arthralgia of temporomandibular joint: Secondary | ICD-10-CM | POA: Diagnosis not present

## 2014-04-20 DIAGNOSIS — M26629 Arthralgia of temporomandibular joint, unspecified side: Secondary | ICD-10-CM

## 2014-04-20 DIAGNOSIS — F32A Depression, unspecified: Secondary | ICD-10-CM

## 2014-04-20 LAB — HEPATIC FUNCTION PANEL
ALT: 13 U/L (ref 7–35)
AST: 18 U/L (ref 13–35)
Alkaline Phosphatase: 79 U/L (ref 25–125)
BILIRUBIN, TOTAL: 0.4 mg/dL

## 2014-04-20 LAB — BASIC METABOLIC PANEL
BUN: 17 mg/dL (ref 4–21)
Creatinine: 1 mg/dL (ref 0.5–1.1)
GLUCOSE: 95 mg/dL
Potassium: 3.8 mmol/L (ref 3.4–5.3)
Sodium: 137 mmol/L (ref 137–147)

## 2014-04-20 LAB — CBC AND DIFFERENTIAL
HCT: 40 % (ref 36–46)
Hemoglobin: 13.9 g/dL (ref 12.0–16.0)
Platelets: 232 10*3/uL (ref 150–399)
WBC: 10.3 10*3/mL

## 2014-04-20 NOTE — Assessment & Plan Note (Signed)
New onset today, pain palpated and with jaw movement, no swelling/redness/warmth noted, no pain when tragus palpated or auricle manipulated or with swallowing. No change s/p NTG x1.  CBC, CMP, UA C/S, CXR, EKG to evaluate further. Celebrex 200mg  daily x 10days.

## 2014-04-20 NOTE — Progress Notes (Signed)
Patient ID: Erica [name], female   DOB: 1927/12/14, 81 y.o.   MRN: [id number]   Code Status: DNR  Allergies  Allergen Reactions  . Actonel [Risedronate Sodium]   . Darvon [Propoxyphene Hcl]   . Hydrocodone Nausea And Vomiting  . Morphine And Related Nausea And Vomiting  . Oxycodone Hcl   . Pneumovax [Pneumococcal Polysaccharide Vaccine]     Chief Complaint  Patient presents with  . Medical Management of Chronic Issues  . Acute Visit    left facial pain.     HPI: Patient is a 81 y.o. female seen in the [facility] at [facility] today for evaluation of left TMJ pain and other chronic medical conditions.  Problem List Items Addressed This Visit    Depression    Stable on Sertraline 75mg , Mirtazapine 7.5mg (sleeps well at night and weight has been stabilized)        DVT (deep venous thrombosis)    Onset 02/01/2012, left extremity DVT, coumadin treatment should be for 3 months. Off Coumadin. Started Xarelto 20mg  for Protein C/S deficiency.       Essential hypertension, benign    off Lisinopril to eliminate possible URI like symptoms-better with chronic cough. Blood pressure is controlled,  takes  Metoprolol 12.5mg  bid. Monitor Bp/P daily.         TMJ tenderness - Primary    New onset today, pain palpated and with jaw movement, no swelling/redness/warmth noted, no pain when tragus palpated or auricle manipulated or with swallowing. No change s/p NTG x1.  CBC, CMP, UA C/S, CXR, EKG to evaluate further. Celebrex 200mg  daily x 10days.           Review of Systems:  Review of Systems  Constitutional: Positive for activity change and appetite change. Negative for fever, chills and diaphoresis.  HENT: Positive for congestion and hearing loss. Negative for ear discharge and sore throat.        Nasal congestion  LTMJ pain when palpated and with jaw movement  Eyes: Negative for pain, discharge and redness.  Respiratory: Positive for cough. Negative for wheezing.     Cardiovascular: Negative for chest pain, palpitations and leg swelling.  Gastrointestinal: Negative for nausea, vomiting, abdominal pain, diarrhea, constipation and blood in stool.  Endocrine: Negative for polydipsia.  Genitourinary: Positive for frequency. Negative for dysuria, urgency, hematuria and flank pain.  Musculoskeletal: Negative for myalgias, back pain and neck pain.  Skin: Negative for rash.       The right mastectomy.  A small less than 1cm smooth mobile nose tip consistency nodule palpated mid left fore arm. A >1cm skin lesion with rough surface,  various shade of brown color, and irregular border represent [facility] dermatology consultation.   Allergic/Immunologic: Negative for environmental allergies.  Neurological: Negative for dizziness, tremors, seizures, weakness and headaches.  Hematological: Does not bruise/bleed easily.  Psychiatric/Behavioral: Negative for hallucinations. The patient is not nervous/anxious.        Didn't sleep/rest well last night.     Past Medical History  Diagnosis Date  . Hypertension   . Depression    History reviewed. No pertinent past surgical history. Social History:   has no tobacco, alcohol, and drug history on file.  No family history on file.  Medications: Patient's Medications  New Prescriptions   No medications on file  Previous Medications   ACETAMINOPHEN (TYLENOL) 325 MG TABLET    Take 650 mg by mouth every 4 (four) hours as needed for pain.   ATORVASTATIN (LIPITOR) 10 MG  TABLET    Take 10 mg by mouth daily.   BETA CAROTENE W/MINERALS (OCUVITE) TABLET    Take 1 tablet by mouth daily.   CALCIUM CARB-CHOLECALCIFEROL (CALCIUM-VITAMIN D) 600-400 MG-UNIT TABS    Take by mouth 2 (two) times daily.   METOPROLOL TARTRATE (LOPRESSOR) 25 MG TABLET    Take 12.5 mg by mouth 2 (two) times daily.   MIRTAZAPINE (REMERON) 7.5 MG TABLET    Take 7.5 mg by mouth at bedtime and may repeat dose one time if needed.   RIVAROXABAN (XARELTO) 20 MG  TABS TABLET    Take by mouth daily.   SERTRALINE (ZOLOFT) 25 MG TABLET    Take 75 mg by mouth daily.  Modified Medications   No medications on file  Discontinued Medications   No medications on file     Physical Exam: Physical Exam  Constitutional: She appears well-developed.  HENT:  Head: Normocephalic and atraumatic.  Left Ear: External ear normal.  Nose: Mucosal edema and rhinorrhea present. Right sinus exhibits no maxillary sinus tenderness and no frontal sinus tenderness. Left sinus exhibits no maxillary sinus tenderness and no frontal sinus tenderness.  Mouth/Throat: Oropharynx is clear and moist. Mucous membranes are not pale, not dry and not cyanotic. No oropharyngeal exudate.  Left TMJ tenderness with jaw movement and palpation.   Eyes: Conjunctivae and EOM are normal. Pupils are equal, round, and reactive to light. Right eye exhibits no discharge. Left eye exhibits no discharge.  Neck: Normal range of motion. Neck supple. No JVD present. No thyromegaly present.  Cardiovascular: Normal rate, regular rhythm and normal heart sounds.  Exam reveals no friction rub.   No murmur heard. Pulmonary/Chest: Effort normal and breath sounds normal. No respiratory distress. She has no wheezes. She has no rales.  Central congestion.   Abdominal: Soft. Bowel sounds are normal. She exhibits no distension. There is no tenderness. There is no rebound and no guarding.  Genitourinary: Vagina normal.  Musculoskeletal: She exhibits no edema or tenderness.       Right shoulder: She exhibits decreased strength.  Right sided paralysis with muscle strength 4/5  Lymphadenopathy:    She has no cervical adenopathy.  Neurological: She is alert. She displays abnormal reflex. No cranial nerve deficit. She exhibits abnormal muscle tone. Coordination abnormal.  Skin: Skin is warm and dry. No rash noted.  S/p right mastectomy. The right mastectomy. Under left breast skin fold rash-satellite pattern. A small  less than 1cm smooth mobile nose tip consistency nodule palpated mid left fore arm. A >1cm skin lesion with rough surface,  various shade of brown color, and irregular border represent [facility] dermatology consultation.     Psychiatric: Her mood appears not anxious. Her affect is not angry, not blunt, not labile and not inappropriate. Her speech is delayed and tangential. Her speech is not rapid and/or pressured and not slurred. She is slowed. She is not agitated, not aggressive, not hyperactive, not withdrawn, not actively hallucinating and not combative. Thought content is not paranoid and not delusional. Cognition and memory are impaired. She does not express impulsivity or inappropriate judgment. She does not exhibit a depressed mood. She is communicative. She exhibits abnormal recent memory. She exhibits normal remote memory. She is attentive.    Filed Vitals:   04/20/14 1251  BP: 134/70  Pulse: 63  Temp: 98.8 F (37.1 C)  TempSrc: Tympanic  Resp: 16      Labs reviewed: Basic Metabolic Panel:  Recent Labs  04/11/14  NA 142  K 3.9  BUN 13  CREATININE 1.0  TSH 1.90   Liver Function Tests:  Recent Labs  04/11/14  AST 17  ALT 13  ALKPHOS 86   No results for input(s): LIPASE, AMYLASE in the last 8760 hours. No results for input(s): AMMONIA in the last 8760 hours. CBC:  Recent Labs  04/11/14  WBC 5.7  HGB 14.0  HCT 42  PLT 204   Lipid Panel: No results for input(s): CHOL, HDL, LDLCALC, TRIG, CHOLHDL, LDLDIRECT in the last 8760 hours.   Past Procedures:  05/04/13 CXR no cardiomegaly, mild pulmonary vascular congestion, no pleural effusion, no inflammatory consolidate or suspicious nodule, mild bibasilar atelectasis.   Assessment/Plan TMJ tenderness New onset today, pain palpated and with jaw movement, no swelling/redness/warmth noted, no pain when tragus palpated or auricle manipulated or with swallowing. No change s/p NTG x1.  CBC, CMP, UA C/S, CXR, EKG to  evaluate further. Celebrex 200mg  daily x 10days.     Depression Stable on Sertraline 75mg , Mirtazapine 7.5mg (sleeps well at night and weight has been stabilized)     DVT (deep venous thrombosis) Onset 02/01/2012, left extremity DVT, coumadin treatment should be for 3 months. Off Coumadin. Started Xarelto 20mg  for Protein C/S deficiency.    Essential hypertension, benign off Lisinopril to eliminate possible URI like symptoms-better with chronic cough. Blood pressure is controlled,  takes  Metoprolol 12.5mg  bid. Monitor Bp/P daily.        Family/ Staff Communication: observe the patient.   Goals of Care: AL  Labs/tests ordered: CBC, CMP, UA C/S, CXR, EKG

## 2014-04-20 NOTE — Assessment & Plan Note (Signed)
Onset 02/01/2012, left extremity DVT, coumadin treatment should be for 3 months. Off Coumadin. Started Xarelto 20mg for Protein C/S deficiency. 

## 2014-04-20 NOTE — Assessment & Plan Note (Signed)
Stable on Sertraline 75mg , Mirtazapine 7.5mg (sleeps well at night and weight has been stabilized)

## 2014-04-20 NOTE — Assessment & Plan Note (Signed)
off Lisinopril to eliminate possible URI like symptoms-better with chronic cough. Blood pressure is controlled,  takes  Metoprolol 12.5mg  bid. Monitor Bp/P daily.

## 2014-04-27 ENCOUNTER — Other Ambulatory Visit: Payer: Self-pay | Admitting: Nurse Practitioner

## 2014-06-15 ENCOUNTER — Other Ambulatory Visit: Payer: Self-pay | Admitting: Nurse Practitioner

## 2014-06-15 LAB — CBC AND DIFFERENTIAL
HEMATOCRIT: 40 % (ref 36–46)
HEMOGLOBIN: 13.3 g/dL (ref 12.0–16.0)
Platelets: 224 10*3/uL (ref 150–399)
WBC: 6.2 10*3/mL

## 2014-08-20 ENCOUNTER — Emergency Department (HOSPITAL_COMMUNITY): Payer: Medicare Other

## 2014-08-20 ENCOUNTER — Emergency Department (HOSPITAL_COMMUNITY)
Admission: EM | Admit: 2014-08-20 | Discharge: 2014-08-21 | Disposition: A | Discharge status: Home / Self Care | Payer: Medicare Other | Attending: Emergency Medicine | Admitting: Emergency Medicine

## 2014-08-20 ENCOUNTER — Encounter (HOSPITAL_COMMUNITY): Payer: Self-pay | Admitting: Emergency Medicine

## 2014-08-20 DIAGNOSIS — I10 Essential (primary) hypertension: Secondary | ICD-10-CM | POA: Insufficient documentation

## 2014-08-20 DIAGNOSIS — F329 Major depressive disorder, single episode, unspecified: Secondary | ICD-10-CM | POA: Diagnosis not present

## 2014-08-20 DIAGNOSIS — Z79899 Other long term (current) drug therapy: Secondary | ICD-10-CM | POA: Diagnosis not present

## 2014-08-20 DIAGNOSIS — R079 Chest pain, unspecified: Secondary | ICD-10-CM | POA: Insufficient documentation

## 2014-08-20 DIAGNOSIS — Z8673 Personal history of transient ischemic attack (TIA), and cerebral infarction without residual deficits: Secondary | ICD-10-CM | POA: Diagnosis not present

## 2014-08-20 HISTORY — DX: Cerebral infarction, unspecified: I63.9

## 2014-08-20 LAB — HEPATIC FUNCTION PANEL
ALT: 13 U/L — ABNORMAL LOW (ref 14–54)
AST: 26 U/L (ref 15–41)
Albumin: 3.7 g/dL (ref 3.5–5.0)
Alkaline Phosphatase: 77 U/L (ref 38–126)
BILIRUBIN INDIRECT: 0.3 mg/dL (ref 0.3–0.9)
BILIRUBIN TOTAL: 0.5 mg/dL (ref 0.3–1.2)
Bilirubin, Direct: 0.2 mg/dL (ref 0.1–0.5)
TOTAL PROTEIN: 6.1 g/dL — AB (ref 6.5–8.1)

## 2014-08-20 LAB — CBC
HEMATOCRIT: 41.4 % (ref 36.0–46.0)
HEMOGLOBIN: 13.7 g/dL (ref 12.0–15.0)
MCH: 31.1 pg (ref 26.0–34.0)
MCHC: 33.1 g/dL (ref 30.0–36.0)
MCV: 94.1 fL (ref 78.0–100.0)
Platelets: 214 10*3/uL (ref 150–400)
RBC: 4.4 MIL/uL (ref 3.87–5.11)
RDW: 13.6 % (ref 11.5–15.5)
WBC: 6 10*3/uL (ref 4.0–10.5)

## 2014-08-20 LAB — BASIC METABOLIC PANEL
Anion gap: 11 (ref 5–15)
BUN: 14 mg/dL (ref 6–20)
CALCIUM: 9.7 mg/dL (ref 8.9–10.3)
CHLORIDE: 109 mmol/L (ref 101–111)
CO2: 20 mmol/L — ABNORMAL LOW (ref 22–32)
Creatinine, Ser: 1.05 mg/dL — ABNORMAL HIGH (ref 0.44–1.00)
GFR calc non Af Amer: 47 mL/min — ABNORMAL LOW (ref >60)
GFR, EST AFRICAN AMERICAN: 55 mL/min — AB (ref >60)
GLUCOSE: 106 mg/dL — AB (ref 65–99)
Potassium: 3.7 mmol/L (ref 3.5–5.1)
SODIUM: 140 mmol/L (ref 135–145)

## 2014-08-20 LAB — I-STAT TROPONIN, ED: Troponin i, poc: 0 ng/mL (ref 0.00–0.08)

## 2014-08-20 MED ORDER — IOHEXOL 350 MG/ML SOLN
100.0000 mL | Freq: Once | INTRAVENOUS | Status: AC | PRN
  Administered 2014-08-20: 65 mL via INTRAVENOUS

## 2014-08-20 MED ORDER — ONDANSETRON HCL 4 MG/2ML IJ SOLN
4.0000 mg | Freq: Once | INTRAMUSCULAR | Status: AC
  Administered 2014-08-20: 4 mg via INTRAVENOUS
  Filled 2014-08-20: qty 2

## 2014-08-20 MED ORDER — FENTANYL CITRATE (PF) 100 MCG/2ML IJ SOLN
25.0000 ug | INTRAMUSCULAR | Status: DC | PRN
  Administered 2014-08-20: 25 ug via INTRAVENOUS
  Filled 2014-08-20: qty 2

## 2014-08-20 NOTE — ED Notes (Signed)
Patient transported to X-ray 

## 2014-08-20 NOTE — ED Notes (Addendum)
Pt comes from [facility] [place] via EMS c/o CP, started this AM, substernal 10/10, no radiating, denies SOB,n,v. Denies trauma. Has some epigastric pain. PTA had 1 nitro, 324 ASA with improvement

## 2014-08-21 LAB — I-STAT TROPONIN, ED: Troponin i, poc: 0 ng/mL (ref 0.00–0.08)

## 2014-08-21 MED ORDER — TRAMADOL HCL 50 MG PO TABS: 50.0000 mg | ORAL_TABLET | Freq: Four times a day (QID) | ORAL | Status: DC | PRN

## 2014-08-21 MED ORDER — OMEPRAZOLE 20 MG PO CPDR: 20.0000 mg | DELAYED_RELEASE_CAPSULE | Freq: Two times a day (BID) | ORAL | Status: DC

## 2014-08-21 NOTE — Discharge Instructions (Signed)
Return to ER with any new or worsening symptoms. °

## 2014-08-21 NOTE — ED Provider Notes (Signed)
CSN: [id number]     Arrival date & time 08/20/14  1922 History   First MD Initiated Contact with Patient 08/20/14 1927     Chief Complaint  Patient presents with  . Chest Pain      HPI  She presents for evaluation of an episode of chest pain. A brief episode this morning. Recurred this afternoon. More severe this afternoon this evening symptoms present for about 2 hours before her arrival here. Describes primarily right chest radiating to the mid sternum.  No nausea vomiting. No food intolerance. No shortness of breath no neck pain back pain shoulder pain arm pain.  History of previous stroke. History of previous lower extremity DVT. On Xarelto.  Past Medical History  Diagnosis Date  . Hypertension   . Depression   . Stroke    Past Surgical History  Procedure Laterality Date  . Abdominal hysterectomy    . Appendectomy    . Breast surgery    . Mastectomy     History reviewed. No pertinent family history. Social History  Substance Use Topics  . Smoking status: Never Smoker   . Smokeless tobacco: Never Used  . Alcohol Use: No   OB History    No data available     Review of Systems  Constitutional: Negative for fever, chills, diaphoresis, appetite change and fatigue.  HENT: Negative for mouth sores, sore throat and trouble swallowing.   Eyes: Negative for visual disturbance.  Respiratory: Negative for cough, chest tightness, shortness of breath and wheezing.   Cardiovascular: Positive for chest pain.  Gastrointestinal: Negative for nausea, vomiting, abdominal pain, diarrhea and abdominal distention.  Endocrine: Negative for polydipsia, polyphagia and polyuria.  Genitourinary: Negative for dysuria, frequency and hematuria.  Musculoskeletal: Negative for gait problem.  Skin: Negative for color change, pallor and rash.  Neurological: Negative for dizziness, syncope, light-headedness and headaches.  Hematological: Does not bruise/bleed easily.  Psychiatric/Behavioral:  Negative for behavioral problems and confusion.      Allergies  Hydrocodone; Morphine and related; Actonel; Darvon; Oxycodone hcl; and Pneumovax  Home Medications   Prior to Admission medications   Medication Sig Start Date End Date Taking? Authorizing Provider  acetaminophen (TYLENOL) 325 MG tablet Take 650 mg by mouth every 4 (four) hours as needed for pain.   Yes Historical Provider, MD  atorvastatin (LIPITOR) 10 MG tablet Take 10 mg by mouth daily.   Yes Historical Provider, MD  beta carotene w/minerals (OCUVITE) tablet Take 1 tablet by mouth daily.   Yes Historical Provider, MD  Calcium Carb-Cholecalciferol (CALCIUM-VITAMIN D) 600-400 MG-UNIT TABS Take by mouth 2 (two) times daily.   Yes Historical Provider, MD  metoprolol tartrate (LOPRESSOR) 25 MG tablet Take 12.5 mg by mouth 2 (two) times daily.   Yes Historical Provider, MD  mirtazapine (REMERON) 7.5 MG tablet Take 7.5 mg by mouth at bedtime and may repeat dose one time if needed.   Yes Historical Provider, MD  Rivaroxaban (XARELTO) 20 MG TABS tablet Take 20 mg by mouth daily with supper.    Yes Historical Provider, MD  sertraline (ZOLOFT) 25 MG tablet Take 75 mg by mouth daily.   Yes Historical Provider, MD   BP 162/63 mmHg  Pulse 70  Temp(Src) 98.7 F (37.1 C) (Rectal)  Resp 37  Ht 5\' 3"  (1.6 m)  Wt 150 lb (68.04 kg)  BMI 26.58 kg/m2  SpO2 98%  LMP  Physical Exam  Constitutional: She is oriented to person, place, and time. She appears well-developed and well-nourished.  No distress.  HENT:  Head: Normocephalic.  Eyes: Conjunctivae are normal. Pupils are equal, round, and reactive to light. No scleral icterus.  Neck: Normal range of motion. Neck supple. No thyromegaly present.  Cardiovascular: Normal rate and regular rhythm.  Exam reveals no gallop and no friction rub.   No murmur heard. Pulmonary/Chest: Effort normal and breath sounds normal. No respiratory distress. She has no wheezes. She has no rales.  Nontender  chest. Clear bilateral breath sounds. Normal heart tones. Normal pulses and fluctuates by normal use of the lower extremities.  Abdominal: Soft. Bowel sounds are normal. She exhibits no distension. There is no tenderness. There is no rebound.  Musculoskeletal: Normal range of motion.  Neurological: She is alert and oriented to person, place, and time.  Skin: Skin is warm and dry. No rash noted.  Psychiatric: She has a normal mood and affect. Her behavior is normal.    ED Course  Procedures (including critical care time) Labs Review Labs Reviewed  BASIC METABOLIC PANEL - Abnormal; Notable for the following:    CO2 20 (*)    Glucose, Bld 106 (*)    Creatinine, Ser 1.05 (*)    GFR calc non Af Amer 47 (*)    GFR calc Af Amer 55 (*)    All other components within normal limits  HEPATIC FUNCTION PANEL - Abnormal; Notable for the following:    Total Protein 6.1 (*)    ALT 13 (*)    All other components within normal limits  CBC  I-STAT TROPOININ, ED  Randolm Idol, ED    Imaging Review Dg Chest 2 View  08/20/2014   CLINICAL DATA:  Left-sided chest pain.  EXAM: CHEST  2 VIEW  COMPARISON:  None.  FINDINGS: There is tortuosity and calcification of the thoracic aorta. Overall heart size is normal. Slight atelectasis at the left lung base. No consolidative infiltrates or effusions. No acute osseous abnormality.  IMPRESSION: Minimal atelectasis at the left lung base.  Aortic atherosclerosis.   Electronically Signed   By: Lorriane Shire M.D.   On: 08/20/2014 20:33   Ct Angio Chest Pe W/cm &/or Wo Cm  08/20/2014   CLINICAL DATA:  Chest pain on the right.  EXAM: CT ANGIOGRAPHY CHEST WITH CONTRAST  TECHNIQUE: Multidetector CT imaging of the chest was performed using the standard protocol during bolus administration of intravenous contrast. Multiplanar CT image reconstructions and MIPs were obtained to evaluate the vascular anatomy.  CONTRAST:  34mL OMNIPAQUE IOHEXOL 350 MG/ML SOLN  COMPARISON:   None.  FINDINGS: THORACIC INLET/BODY WALL:  Right mastectomy.  No axillary adenopathy.  MEDIASTINUM:  Mild cardiomegaly. No pericardial effusion. Scattered aortic atherosclerosis. No aortic dissection or aneurysm. No evidence of pulmonary embolism.  LUNG WINDOWS:  Low lung volumes with patchy dependent opacity likely atelectasis. Bandlike opacities in the right middle lobe and lingula contain internal mineralization and likely reflects postinflammatory scarring. Subpleural scarring also seen at the right greater than left apex, also with calcification. No definitive pneumonia. No edema, effusion, or pneumothorax. Mild cylindrical bronchiectasis in the right upper lobe.  UPPER ABDOMEN:  No acute findings.  OSSEOUS:  No definitive explanation for right chest pain. An anterior left third rib sclerotic focus is likely a healed fracture.  Review of the MIP images confirms the above findings.  IMPRESSION: 1. No evidence of pulmonary embolism or other acute disease. 2. Dependent atelectasis superimposed on lung scarring.   Electronically Signed   By: Monte Fantasia M.D.   On:  08/20/2014 22:17   I, Lashaye Fisk JOSEPH, personally reviewed and evaluated these images and lab results as part of my medical decision-making.   EKG Interpretation None      MDM   Final diagnoses:  Chest pain, unspecified chest pain type    Normal CT of the chest.  Normal EKG and troponins x 2.  I discussed obs admit vs DC with daughter. Preference is for Dc with family.  States that she would not want "any invasive treatment for anything serious anyway".      Tanna Furry, MD 08/21/14 [phone]

## 2014-08-31 ENCOUNTER — Encounter: Payer: Medicare Other | Admitting: Nurse Practitioner

## 2014-08-31 NOTE — Progress Notes (Signed)
This encounter was created in error - please disregard.

## 2014-09-14 ENCOUNTER — Non-Acute Institutional Stay: Payer: Medicare Other | Admitting: Nurse Practitioner

## 2014-09-14 ENCOUNTER — Encounter: Payer: Self-pay | Admitting: Nurse Practitioner

## 2014-09-14 VITALS — BP 160/74 | HR 76 | Temp 97.8°F | Wt 151.0 lb

## 2014-09-14 DIAGNOSIS — I10 Essential (primary) hypertension: Secondary | ICD-10-CM

## 2014-09-14 DIAGNOSIS — I82402 Acute embolism and thrombosis of unspecified deep veins of left lower extremity: Secondary | ICD-10-CM | POA: Diagnosis not present

## 2014-09-14 DIAGNOSIS — F329 Major depressive disorder, single episode, unspecified: Secondary | ICD-10-CM

## 2014-09-14 DIAGNOSIS — N63 Unspecified lump in breast: Secondary | ICD-10-CM | POA: Diagnosis not present

## 2014-09-14 DIAGNOSIS — F32A Depression, unspecified: Secondary | ICD-10-CM

## 2014-09-14 DIAGNOSIS — K219 Gastro-esophageal reflux disease without esophagitis: Secondary | ICD-10-CM | POA: Diagnosis not present

## 2014-09-14 DIAGNOSIS — N6320 Unspecified lump in the left breast, unspecified quadrant: Secondary | ICD-10-CM

## 2014-09-14 NOTE — Assessment & Plan Note (Signed)
Continue Xarelto 20mg  daily, Protein C+S deficiency.

## 2014-09-14 NOTE — Progress Notes (Signed)
Patient ID: Erica [name], female   DOB: 03-29-1927, 81 y.o.   MRN: [id number]  Location:  clinic [place] Provider:  Marlana Latus NP  Code Status:  DNR Goals of care: Advanced Directive information    Chief Complaint  Patient presents with  . Breast Pain    off and on left breast for 3 weeks, goes across both breast, rash under right breast.  No injury.  08/20/14 went to ER for chest pain, all test negative. Had bowel movement seem to help pain.  Here with daughter [name]     HPI: Patient is a 81 y.o. female seen in the clinic at [facility] today for evaluation of c/o pain in the left breast, travels to the right chest wall, positional-standing up-gravity worsens pain. No mass, nodule, erythema, fever, or swelling noted. No injury noted.   Review of Systems:  Review of Systems  Constitutional: Negative for fever, chills and diaphoresis.  HENT: Positive for hearing loss. Negative for congestion, ear discharge and sore throat.   Eyes: Negative for pain, discharge and redness.  Respiratory: Negative for cough and wheezing.   Cardiovascular: Negative for chest pain, palpitations and leg swelling.  Gastrointestinal: Negative for nausea, vomiting, abdominal pain, diarrhea, constipation and blood in stool.  Genitourinary: Positive for frequency. Negative for dysuria, urgency, hematuria and flank pain.  Musculoskeletal: Negative for myalgias, back pain and neck pain.       Left breast pain travels to the right, gravity worsens pain when she stands up  Skin: Negative for rash.       The right mastectomy.  A small less than 1cm smooth mobile nose tip consistency nodule palpated mid left fore arm. A >1cm skin lesion with rough surface,  various shade of brown color, and irregular border represent [facility] dermatology consultation.   Neurological: Negative for dizziness, tremors, seizures, weakness and headaches.  Psychiatric/Behavioral: Positive for memory loss. Negative for depression and  hallucinations. The patient is not nervous/anxious.     Past Medical History  Diagnosis Date  . Hypertension   . Depression   . Stroke     Patient Active Problem List   Diagnosis Date Noted  . TMJ tenderness 04/20/2014  . Mass of left breast on mammogram 12/15/2013  . Sinusitis, chronic 11/30/2013  . Candidiasis of skin 11/10/2013  . Solitary bone cyst of left forearm 11/10/2013  . SCC (squamous cell carcinoma), scalp/neck 11/10/2013  . Conjunctivitis 08/24/2013  . Acute upper respiratory infection 05/04/2013  . Shingles 11/24/2012  . GERD (gastroesophageal reflux disease) 09/29/2012  . Protein C deficiency 06/14/2012  . Protein S deficiency 06/14/2012  . DNR (do not resuscitate) 06/14/2012  . 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 Weight gain 05/10/2012  . Essential hypertension, benign 03/24/2012  . Depression 03/24/2012  . DVT (deep venous thrombosis) 03/24/2012  . Dysphagia, unspecified(787.20) 03/24/2012  . CVA (cerebral vascular accident) 03/24/2012  . Osteoporosis 03/24/2012  . Macular degeneration 03/24/2012  . Aphasia 03/24/2012  . Insomnia 03/24/2012  . Other and unspecified hyperlipidemia 03/24/2012    Allergies  Allergen Reactions  . Hydrocodone Nausea And Vomiting  . Morphine And Related Nausea And Vomiting  . Actonel [Risedronate Sodium] Other (See Comments)    unknown  . Darvon [Propoxyphene Hcl] Other (See Comments)    unknown  . Oxycodone Hcl Other (See Comments)    unknown  . Pneumovax [Pneumococcal Polysaccharide Vaccine] Other (See Comments)    unknown    Medications: Patient's Medications  New Prescriptions   No  medications on file  Previous Medications   ACETAMINOPHEN (TYLENOL) 325 MG TABLET    Take 650 mg by mouth every 4 (four) hours as needed for pain.   ATORVASTATIN (LIPITOR) 10 MG TABLET    Take 10 mg by mouth daily.   BETA CAROTENE W/MINERALS (OCUVITE) TABLET    Take 1 tablet by mouth daily.   CALCIUM CARB-CHOLECALCIFEROL  (CALCIUM-VITAMIN D) 600-400 MG-UNIT TABS    Take by mouth 2 (two) times daily.   METOPROLOL TARTRATE (LOPRESSOR) 25 MG TABLET    Take 12.5 mg by mouth 2 (two) times daily.   MIRTAZAPINE (REMERON) 7.5 MG TABLET    Take 7.5 mg by mouth at bedtime and may repeat dose one time if needed.   OMEPRAZOLE (PRILOSEC) 20 MG CAPSULE    Take 1 capsule (20 mg total) by mouth 2 (two) times daily.   RIVAROXABAN (XARELTO) 20 MG TABS TABLET    Take 20 mg by mouth daily with supper.    SERTRALINE (ZOLOFT) 25 MG TABLET    Take 75 mg by mouth daily.   TRAMADOL (ULTRAM) 50 MG TABLET    Take 1 tablet (50 mg total) by mouth every 6 (six) hours as needed.  Modified Medications   No medications on file  Discontinued Medications   No medications on file    Physical Exam: Filed Vitals:   09/14/14 1349  BP: 160/74  Pulse: 76  Temp: 97.8 F (36.6 C)  TempSrc: Oral  Weight: 151 lb (68.493 kg)  SpO2: 98%   Body mass index is 26.76 kg/(m^2).  Physical Exam  Constitutional: She appears well-developed.  HENT:  Head: Normocephalic and atraumatic.  Left Ear: External ear normal.  Nose: Mucosal edema and rhinorrhea present. Right sinus exhibits no maxillary sinus tenderness and no frontal sinus tenderness. Left sinus exhibits no maxillary sinus tenderness and no frontal sinus tenderness.  Mouth/Throat: Oropharynx is clear and moist. Mucous membranes are not pale, not dry and not cyanotic. No oropharyngeal exudate.  Eyes: Conjunctivae and EOM are normal. Pupils are equal, round, and reactive to light. Right eye exhibits no discharge. Left eye exhibits no discharge.  Neck: Normal range of motion. Neck supple. No JVD present. No thyromegaly present.  Cardiovascular: Normal rate, regular rhythm and normal heart sounds.  Exam reveals no friction rub.   No murmur heard. Pulmonary/Chest: Effort normal and breath sounds normal. No respiratory distress. She has no wheezes. She has no rales.  Abdominal: Soft. Bowel sounds are  normal. She exhibits no distension. There is no tenderness. There is no rebound and no guarding.  Genitourinary: Vagina normal.  Musculoskeletal: She exhibits no edema or tenderness.       Right shoulder: She exhibits decreased strength.  Right sided paralysis with muscle strength 4/5  Lymphadenopathy:    She has no cervical adenopathy.  Neurological: She is alert. She displays abnormal reflex. No cranial nerve deficit. She exhibits abnormal muscle tone. Coordination abnormal.  Skin: Skin is warm and dry. No rash noted.  S/p right mastectomy. The right mastectomy. A small less than 1cm smooth mobile nose tip consistency nodule palpated mid left fore arm. A >1cm skin lesion with rough surface,  various shade of brown color, and irregular border represent [facility] dermatology consultation.  Pain in the left breast reported, radiates to the left chest wall, worsens when she stands up    Psychiatric: Her mood appears not anxious. Her affect is not angry, not blunt, not labile and not inappropriate. Her speech is delayed  and tangential. Her speech is not rapid and/or pressured and not slurred. She is slowed. She is not agitated, not aggressive, not hyperactive, not withdrawn, not actively hallucinating and not combative. Thought content is not paranoid and not delusional. Cognition and memory are impaired. She does not express impulsivity or inappropriate judgment. She does not exhibit a depressed mood. She is communicative. She exhibits abnormal recent memory. She exhibits normal remote memory. She is attentive.    Labs reviewed: Basic Metabolic Panel:  Recent Labs  04/11/14 04/20/14 08/20/14 2001  NA 142 137 140  K 3.9 3.8 3.7  CL  --   --  109  CO2  --   --  20*  GLUCOSE  --   --  106*  BUN 13 17 14   CREATININE 1.0 1.0 1.05*  CALCIUM  --   --  9.7    Liver Function Tests:  Recent Labs  04/11/14 04/20/14 08/20/14 2001  AST 17 18 26   ALT 13 13 13*  ALKPHOS 86 79 77  BILITOT  --    --  0.5  PROT  --   --  6.1*  ALBUMIN  --   --  3.7    CBC:  Recent Labs  04/20/14 06/15/14 08/20/14 2001  WBC 10.3 6.2 6.0  HGB 13.9 13.3 13.7  HCT 40 40 41.4  MCV  --   --  94.1  PLT 232 224 214    Lab Results  Component Value Date   TSH 1.90 04/11/2014   Lab Results  Component Value Date   HGBA1C 5.7 04/11/2014   No results found for: CHOL, HDL, LDLCALC, LDLDIRECT, TRIG, CHOLHDL  Significant Diagnostic Results since last visit: none  Patient Care Team: Estill Dooms, MD as PCP - General (Internal Medicine)  Assessment/Plan Problem List Items Addressed This Visit    Essential hypertension, benign    Controlled Bp, better with URI like symptoms since off Lisinopril, continue Metoprolol 12.5mg  bid. Monitor Bp/P daily.          Depression    Mood is stable, continue Sertraline 75mg , Mirtazapine 7.5mg (sleeps well at night and weight has been stabilized)      DVT (deep venous thrombosis)    Continue Xarelto 20mg  daily, Protein C+S deficiency.       GERD (gastroesophageal reflux disease)    Stable, continue Omeprazole 20mg  daily.       Mass of left breast on mammogram - Primary    12/12/13 Mammogram: further evaluation is suggested for possible masses in the left breast.  09/14/14 c/o left breast pain, travels to the left chest and R chest. Positional, worse when stand up, nerve pain from the right mastectomy aggravated by the left breast weight vs thoracic spine stenosis, update CXR and Xray thoracic spine. Prn Tramadol available to her for pain. 08/20/14 ED visit for similar pain episode, it was r/u cardiac etiology. Omeprazole added for possible GERD symptoms. Constipation was resolved. Pain was resolved afterwards.           Family/ staff Communication: observe the pain in her breast  Labs/tests ordered: CXR, Xray thoracic spine.   Ms [facility] Sheralyn Pinegar NP Geriatrics [facility] [phone], [place] [address] On Call:   [phone] & follow prompts after 5pm & weekends Office Phone:  [phone] Office Fax:  [phone]

## 2014-09-14 NOTE — Assessment & Plan Note (Signed)
Mood is stable, continue Sertraline 75mg, Mirtazapine 7.5mg(sleeps well at night and weight has been stabilized 

## 2014-09-14 NOTE — Assessment & Plan Note (Signed)
Stable, continue Omeprazole 20mg daily.  

## 2014-09-14 NOTE — Assessment & Plan Note (Signed)
Controlled Bp, better with URI like symptoms since off Lisinopril, continue Metoprolol 12.5mg  bid. Monitor Bp/P daily.

## 2014-09-14 NOTE — Assessment & Plan Note (Addendum)
12/12/13 Mammogram: further evaluation is suggested for possible masses in the left breast.  09/14/14 c/o left breast pain, travels to the left chest and R chest. Positional, worse when stand up, nerve pain from the right mastectomy aggravated by the left breast weight vs thoracic spine stenosis, update CXR and Xray thoracic spine. Prn Tramadol available to her for pain. 08/20/14 ED visit for similar pain episode, it was r/u cardiac etiology. Omeprazole added for possible GERD symptoms. Constipation was resolved. Pain was resolved afterwards.

## 2014-10-04 ENCOUNTER — Encounter: Payer: Self-pay | Admitting: Nurse Practitioner

## 2014-10-04 ENCOUNTER — Non-Acute Institutional Stay: Payer: Medicare Other | Admitting: Nurse Practitioner

## 2014-10-04 DIAGNOSIS — I10 Essential (primary) hypertension: Secondary | ICD-10-CM

## 2014-10-04 DIAGNOSIS — F32A Depression, unspecified: Secondary | ICD-10-CM

## 2014-10-04 DIAGNOSIS — N63 Unspecified lump in breast: Secondary | ICD-10-CM

## 2014-10-04 DIAGNOSIS — I82402 Acute embolism and thrombosis of unspecified deep veins of left lower extremity: Secondary | ICD-10-CM | POA: Diagnosis not present

## 2014-10-04 DIAGNOSIS — R1013 Epigastric pain: Secondary | ICD-10-CM | POA: Diagnosis not present

## 2014-10-04 DIAGNOSIS — F329 Major depressive disorder, single episode, unspecified: Secondary | ICD-10-CM | POA: Diagnosis not present

## 2014-10-04 DIAGNOSIS — K219 Gastro-esophageal reflux disease without esophagitis: Secondary | ICD-10-CM | POA: Diagnosis not present

## 2014-10-04 DIAGNOSIS — N6320 Unspecified lump in the left breast, unspecified quadrant: Secondary | ICD-10-CM

## 2014-10-04 NOTE — Assessment & Plan Note (Signed)
Mood is stable, continue Sertraline 75mg, Mirtazapine 7.5mg(sleeps well at night and weight has been stabilized 

## 2014-10-04 NOTE — Progress Notes (Signed)
Patient ID: Erica [name], female   DOB: 1927/07/28, 81 y.o.   MRN: [id number]  [facility]:  [facility] Provider:  Marlana Latus NP  Code Status:  DNR Goals of care: Advanced Directive information    Chief Complaint  Patient presents with  . Medical Management of Chronic Issues  . Acute Visit    left chest breast/chest wall pain.      HPI: Patient is a 81 y.o. female seen in the [facility] today for evaluation of  Persisted pain left breast, upper abd, worse as day goes, but sleeps and no pain at night, 09/14/14 X-ray chest and thoracic spine unremarkable. Denied nausea, vomiting, diarrhea, constipation(MiraLax daily), chest pain, palpitation, or SOB. No apparent swelling in BLE. Takes Sertraline 75mg  and Mirtazapine 7.5mg  nightly for mood and sleep.   Review of Systems:  Review of Systems  Constitutional: Negative for fever, chills and diaphoresis.  HENT: Positive for hearing loss. Negative for congestion, ear discharge and sore throat.   Eyes: Negative for pain, discharge and redness.  Respiratory: Negative for cough and wheezing.   Cardiovascular: Negative for chest pain, palpitations and leg swelling.  Gastrointestinal: Positive for abdominal pain. Negative for heartburn, nausea, vomiting, diarrhea, constipation and blood in stool.       Epigastric pain palpated  Genitourinary: Positive for frequency. Negative for dysuria, urgency, hematuria and flank pain.  Musculoskeletal: Negative for myalgias, back pain and neck pain.       Left breast pain travels to the right, gravity worsens pain when she stands up  Skin: Negative for rash.       The right mastectomy.  A small less than 1cm smooth mobile nose tip consistency nodule palpated mid left fore arm. A >1cm skin lesion with rough surface,  various shade of brown color, and irregular border represent [facility] dermatology consultation.   Neurological: Negative for dizziness, tremors, seizures, weakness and headaches.    Psychiatric/Behavioral: Positive for memory loss. Negative for depression and hallucinations. The patient is not nervous/anxious.     Past Medical History  Diagnosis Date  . Hypertension   . Depression   . Stroke     Patient Active Problem List   Diagnosis Date Noted  . Abdominal pain, epigastric 10/04/2014  . TMJ tenderness 04/20/2014  . Mass of left breast on mammogram 12/15/2013  . Sinusitis, chronic 11/30/2013  . Candidiasis of skin 11/10/2013  . Solitary bone cyst of left forearm 11/10/2013  . SCC (squamous cell carcinoma), scalp/neck 11/10/2013  . Conjunctivitis 08/24/2013  . Acute upper respiratory infection 05/04/2013  . Shingles 11/24/2012  . GERD (gastroesophageal reflux disease) 09/29/2012  . Protein C deficiency 06/14/2012  . Protein S deficiency 06/14/2012  . DNR (do not resuscitate) 06/14/2012  . 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 Weight gain 05/10/2012  . Essential hypertension, benign 03/24/2012  . Depression 03/24/2012  . DVT (deep venous thrombosis) 03/24/2012  . Dysphagia, unspecified(787.20) 03/24/2012  . CVA (cerebral vascular accident) 03/24/2012  . Osteoporosis 03/24/2012  . Macular degeneration 03/24/2012  . Aphasia 03/24/2012  . Insomnia 03/24/2012  . Other and unspecified hyperlipidemia 03/24/2012    Allergies  Allergen Reactions  . Hydrocodone Nausea And Vomiting  . Morphine And Related Nausea And Vomiting  . Actonel [Risedronate Sodium] Other (See Comments)    unknown  . Darvon [Propoxyphene Hcl] Other (See Comments)    unknown  . Oxycodone Hcl Other (See Comments)    unknown  . Pneumovax [Pneumococcal Polysaccharide Vaccine] Other (See Comments)  unknown    Medications: Patient's Medications  New Prescriptions   No medications on file  Previous Medications   ACETAMINOPHEN (TYLENOL) 325 MG TABLET    Take 650 mg by mouth every 4 (four) hours as needed for pain.   ATORVASTATIN (LIPITOR) 10 MG TABLET    Take 10 mg by mouth  daily.   BETA CAROTENE W/MINERALS (OCUVITE) TABLET    Take 1 tablet by mouth daily.   CALCIUM CARB-CHOLECALCIFEROL (CALCIUM-VITAMIN D) 600-400 MG-UNIT TABS    Take by mouth 2 (two) times daily.   METOPROLOL TARTRATE (LOPRESSOR) 25 MG TABLET    Take 12.5 mg by mouth 2 (two) times daily.   MIRTAZAPINE (REMERON) 7.5 MG TABLET    Take 7.5 mg by mouth at bedtime and may repeat dose one time if needed.   OMEPRAZOLE (PRILOSEC) 20 MG CAPSULE    Take 1 capsule (20 mg total) by mouth 2 (two) times daily.   RIVAROXABAN (XARELTO) 20 MG TABS TABLET    Take 20 mg by mouth daily with supper.    SERTRALINE (ZOLOFT) 25 MG TABLET    Take 75 mg by mouth daily.   TRAMADOL (ULTRAM) 50 MG TABLET    Take 1 tablet (50 mg total) by mouth every 6 (six) hours as needed.  Modified Medications   No medications on file  Discontinued Medications   No medications on file    Physical Exam: Filed Vitals:   10/04/14 1507  BP: 130/60  Pulse: 70  Temp: 98.1 F (36.7 C)  TempSrc: Tympanic  Resp: 18   There is no weight on file to calculate BMI.  Physical Exam  Constitutional: She appears well-developed.  HENT:  Head: Normocephalic and atraumatic.  Left Ear: External ear normal.  Nose: Mucosal edema and rhinorrhea present. Right sinus exhibits no maxillary sinus tenderness and no frontal sinus tenderness. Left sinus exhibits no maxillary sinus tenderness and no frontal sinus tenderness.  Mouth/Throat: Oropharynx is clear and moist. Mucous membranes are not pale, not dry and not cyanotic. No oropharyngeal exudate.  Eyes: Conjunctivae and EOM are normal. Pupils are equal, round, and reactive to light. Right eye exhibits no discharge. Left eye exhibits no discharge.  Neck: Normal range of motion. Neck supple. No JVD present. No thyromegaly present.  Cardiovascular: Normal rate, regular rhythm and normal heart sounds.  Exam reveals no friction rub.   No murmur heard. Pulmonary/Chest: Effort normal and breath sounds  normal. No respiratory distress. She has no wheezes. She has no rales.  Abdominal: Soft. Bowel sounds are normal. She exhibits no distension. There is tenderness. There is no rebound and no guarding.  Epigastric tenderness palpated, negative murphy's sign.   Genitourinary: Vagina normal.  Musculoskeletal: She exhibits no edema or tenderness.       Right shoulder: She exhibits decreased strength.  Right sided paralysis with muscle strength 4/5  Lymphadenopathy:    She has no cervical adenopathy.  Neurological: She is alert. She displays abnormal reflex. No cranial nerve deficit. She exhibits abnormal muscle tone. Coordination abnormal.  Skin: Skin is warm and dry. No rash noted.  S/p right mastectomy. The right mastectomy. A small less than 1cm smooth mobile nose tip consistency nodule palpated mid left fore arm. A >1cm skin lesion with rough surface,  various shade of brown color, and irregular border represent [facility] dermatology consultation.  Pain in the left breast reported, radiates to the left chest wall, worsens when she stands up    Psychiatric: Her mood appears not  anxious. Her affect is not angry, not blunt, not labile and not inappropriate. Her speech is delayed and tangential. Her speech is not rapid and/or pressured and not slurred. She is slowed. She is not agitated, not aggressive, not hyperactive, not withdrawn, not actively hallucinating and not combative. Thought content is not paranoid and not delusional. Cognition and memory are impaired. She does not express impulsivity or inappropriate judgment. She does not exhibit a depressed mood. She is communicative. She exhibits abnormal recent memory. She exhibits normal remote memory. She is attentive.    Labs reviewed: Basic Metabolic Panel:  Recent Labs  04/11/14 04/20/14 08/20/14 2001  NA 142 137 140  K 3.9 3.8 3.7  CL  --   --  109  CO2  --   --  20*  GLUCOSE  --   --  106*  BUN 13 17 14   CREATININE 1.0 1.0 1.05*    CALCIUM  --   --  9.7    Liver Function Tests:  Recent Labs  04/11/14 04/20/14 08/20/14 2001  AST 17 18 26   ALT 13 13 13*  ALKPHOS 86 79 77  BILITOT  --   --  0.5  PROT  --   --  6.1*  ALBUMIN  --   --  3.7    CBC:  Recent Labs  04/20/14 06/15/14 08/20/14 2001  WBC 10.3 6.2 6.0  HGB 13.9 13.3 13.7  HCT 40 40 41.4  MCV  --   --  94.1  PLT 232 224 214    Lab Results  Component Value Date   TSH 1.90 04/11/2014   Lab Results  Component Value Date   HGBA1C 5.7 04/11/2014   No results found for: CHOL, HDL, LDLCALC, LDLDIRECT, TRIG, CHOLHDL  Significant Diagnostic Results since last visit: none  Patient Care Team: Estill Dooms, MD as PCP - General (Internal Medicine)  Assessment/Plan Problem List Items Addressed This Visit    Mass of left breast on mammogram    C/o left breast pain, Mammogram to evaluate further.       GERD (gastroesophageal reflux disease)    Stable, continue Omeprazole 20mg  daily.       Essential hypertension, benign    Controlled Bp, better with URI like symptoms since off Lisinopril, continue Metoprolol 12.5mg  bid. Monitor Bp/P daily.      DVT (deep venous thrombosis)    Onset 02/01/2012, left extremity DVT, coumadin treatment should be for 3 months. Off Coumadin. Started Xarelto 20mg  for Protein C/S deficiency.      Depression    Mood is stable, continue Sertraline 75mg , Mirtazapine 7.5mg (sleeps well at night and weight has been stabilized)      Abdominal pain, epigastric - Primary       Family/ staff Communication: observe the patient.   Labs/tests ordered:  Amylase, Lipase, CBC, CMP, TSH, H Pylori antibody, US liver, gallbladder, pancrease, CT abd and chest, mammogram left breast.   Marlana Latus NP [facility] [address], [place] [address] On Call:  [phone] & follow prompts after 5pm & weekends Office Phone:  [phone] Office Fax:  [phone]

## 2014-10-04 NOTE — Assessment & Plan Note (Signed)
C/o left breast pain, Mammogram to evaluate further.

## 2014-10-04 NOTE — Assessment & Plan Note (Signed)
Controlled Bp, better with URI like symptoms since off Lisinopril, continue Metoprolol 12.5mg  bid. Monitor Bp/P daily.

## 2014-10-04 NOTE — Assessment & Plan Note (Signed)
Stable, continue Omeprazole 20mg daily.  

## 2014-10-04 NOTE — Assessment & Plan Note (Signed)
Onset 02/01/2012, left extremity DVT, coumadin treatment should be for 3 months. Off Coumadin. Started Xarelto 20mg for Protein C/S deficiency. 

## 2014-10-05 LAB — CBC AND DIFFERENTIAL
HEMATOCRIT: 41 % (ref 36–46)
Hemoglobin: 14 g/dL (ref 12.0–16.0)
PLATELETS: 223 10*3/uL (ref 150–399)
WBC: 6 10*3/mL

## 2014-10-05 LAB — HEPATIC FUNCTION PANEL
ALK PHOS: 103 U/L (ref 25–125)
ALT: 12 U/L (ref 7–35)
AST: 15 U/L (ref 13–35)
Bilirubin, Total: 0.4 mg/dL

## 2014-10-05 LAB — BASIC METABOLIC PANEL
BUN: 12 mg/dL (ref 4–21)
CREATININE: 0.9 mg/dL (ref 0.5–1.1)
Glucose: 87 mg/dL
Potassium: 4.2 mmol/L (ref 3.4–5.3)
Sodium: 141 mmol/L (ref 137–147)

## 2014-10-05 LAB — TSH: TSH: 1.54 u[IU]/mL (ref 0.41–5.90)

## 2014-10-13 ENCOUNTER — Telehealth: Payer: Self-pay | Admitting: *Deleted

## 2014-10-13 NOTE — Telephone Encounter (Signed)
Erica [name] with Dr. Burnice Logan office called and stated that patient had a root canal and abscess. Antibiotic was changed to cylindacin 300mg  and also prescribed Steripred 5mg  a 6 day dose pack. Daughter does not have it listed on chart and they want to know if it is ok for patient to take during the day. They are not sure, the Dr started looking through the chart from nursing home and it wasn't listed and they wanted to make sure it is ok for patient to take it during her daylight hours only if she needed it beginning tomorrow. Please Advise.

## 2014-10-13 NOTE — Telephone Encounter (Signed)
Rewey notified and stated that she just got off the phone with Milus Banister at [facility] and they have already started the patient on it yesterday.

## 2014-10-13 NOTE — Telephone Encounter (Signed)
Although the message is a little confusing, it is okay for her to take clindamycin as ordered. It is also okay to take a prednisone 5 mg six-day dose pack if that was ordered.

## 2014-10-26 ENCOUNTER — Other Ambulatory Visit: Payer: Self-pay | Admitting: Nurse Practitioner

## 2014-10-26 DIAGNOSIS — R1013 Epigastric pain: Secondary | ICD-10-CM

## 2014-12-14 ENCOUNTER — Encounter: Payer: Self-pay | Admitting: Internal Medicine

## 2014-12-14 ENCOUNTER — Non-Acute Institutional Stay: Payer: Medicare Other | Admitting: Internal Medicine

## 2014-12-14 VITALS — BP 144/78 | HR 72 | Temp 98.2°F | Ht 63.0 in | Wt 155.0 lb

## 2014-12-14 DIAGNOSIS — R1011 Right upper quadrant pain: Secondary | ICD-10-CM

## 2014-12-14 NOTE — Progress Notes (Signed)
Patient ID: Erica [name], female   DOB: 1927/11/03, 81 y.o.   MRN: [id number]    [place] Room Number: [phone]  Place of Service: Clinic (12)     Allergies  Allergen Reactions  . Hydrocodone Nausea And Vomiting  . Morphine And Related Nausea And Vomiting  . Actonel [Risedronate Sodium] Other (See Comments)    unknown  . Darvon [Propoxyphene Hcl] Other (See Comments)    unknown  . Oxycodone Hcl Other (See Comments)    unknown  . Pneumovax [Pneumococcal Polysaccharide Vaccine] Other (See Comments)    unknown    Chief Complaint  Patient presents with  . Abdominal Pain    on going problem, here with daughter [name], they want results of x-rays and lab done for this in September    HPI:  RUQ pain - patient has had a fairly persistent problem with pain in the abdomen and seems to center in the right upper quadrant and sometimes epigastric area. There are times of intensification of the pain. There is no nausea and she continues to eat. She had an ultrasound done of the abdomen that showed gallbladder sludge but no other significant abnormalities. She has also had a great deal of lab work done as reported below. There is no elevation of liver enzymes and CBC seems to be normal. Pancreatic enzymes are normal as are other basic chemistries.    Medications: Patient's Medications  New Prescriptions   No medications on file  Previous Medications   ACETAMINOPHEN (TYLENOL) 325 MG TABLET    Take 650 mg by mouth every 4 (four) hours as needed for pain.   ATORVASTATIN (LIPITOR) 10 MG TABLET    Take 10 mg by mouth daily.   BETA CAROTENE W/MINERALS (OCUVITE) TABLET    Take 1 tablet by mouth daily.   CALCIUM CARB-CHOLECALCIFEROL (CALCIUM-VITAMIN D) 600-400 MG-UNIT TABS    Take by mouth 2 (two) times daily.   FLUTICASONE (FLONASE) 50 MCG/ACT NASAL SPRAY    Place 1 spray into both nostrils daily.   METOPROLOL TARTRATE (LOPRESSOR) 25 MG TABLET    Take 12.5 mg by  mouth 2 (two) times daily.   MIRTAZAPINE (REMERON) 7.5 MG TABLET    Take 7.5 mg by mouth at bedtime and may repeat dose one time if needed.   OMEPRAZOLE (PRILOSEC) 20 MG CAPSULE    Take 1 capsule (20 mg total) by mouth 2 (two) times daily.   POLYETHYLENE GLYCOL (MIRALAX / GLYCOLAX) PACKET    Take 17 g by mouth daily.   RIVAROXABAN (XARELTO) 20 MG TABS TABLET    Take 20 mg by mouth daily with supper.    SERTRALINE (ZOLOFT) 25 MG TABLET    Take 75 mg by mouth daily.   TRAMADOL (ULTRAM) 50 MG TABLET    Take 1 tablet (50 mg total) by mouth every 6 (six) hours as needed.  Modified Medications   No medications on file  Discontinued Medications   No medications on file     Review of Systems  Constitutional: Negative for fever, chills and diaphoresis.  HENT: Positive for hearing loss. Negative for congestion, ear discharge and sore throat.   Eyes: Negative for pain, discharge and redness.  Respiratory: Negative for cough and wheezing.   Cardiovascular: Negative for chest pain, palpitations and leg swelling.  Gastrointestinal: Positive for abdominal pain. Negative for nausea, vomiting, diarrhea, constipation and blood in stool.       Epigastric pain on palpation  Genitourinary: Positive for  frequency. Negative for dysuria, urgency, hematuria and flank pain.  Musculoskeletal: Negative for myalgias, back pain and neck pain.       Left breast pain travels to the right, gravity worsens pain when she stands up  Skin: Negative for rash.       The right mastectomy.  A small less than 1cm smooth mobile nose tip consistency nodule palpated mid left fore arm. A >1cm skin lesion with rough surface,  various shade of brown color, and irregular border represent [facility] dermatology consultation.   Neurological: Negative for dizziness, tremors, seizures, weakness and headaches.  Psychiatric/Behavioral: Negative for hallucinations. The patient is not nervous/anxious.     Filed Vitals:   12/14/14 1420  BP:  144/78  Pulse: 72  Temp: 98.2 F (36.8 C)  TempSrc: Oral  Height: '5\' 3"'  (1.6 m)  Weight: 155 lb (70.308 kg)  SpO2: 97%   Body mass index is 27.46 kg/(m^2).  Physical Exam  Constitutional: She appears well-developed.  HENT:  Head: Normocephalic and atraumatic.  Left Ear: External ear normal.  Nose: Mucosal edema and rhinorrhea present. Right sinus exhibits no maxillary sinus tenderness and no frontal sinus tenderness. Left sinus exhibits no maxillary sinus tenderness and no frontal sinus tenderness.  Mouth/Throat: Oropharynx is clear and moist. Mucous membranes are not pale, not dry and not cyanotic. No oropharyngeal exudate.  Eyes: Conjunctivae and EOM are normal. Pupils are equal, round, and reactive to light. Right eye exhibits no discharge. Left eye exhibits no discharge.  Neck: Normal range of motion. Neck supple. No JVD present. No thyromegaly present.  Cardiovascular: Normal rate, regular rhythm and normal heart sounds.  Exam reveals no friction rub.   No murmur heard. Pulmonary/Chest: Effort normal and breath sounds normal. No respiratory distress. She has no wheezes. She has no rales.  Abdominal: Soft. Bowel sounds are normal. She exhibits no distension. There is tenderness. There is no rebound and no guarding.  Epigastric tenderness when palpated, negative Murphy's sign. Possible mild enlargement of the liver on palpation.  Genitourinary: Vagina normal.  Musculoskeletal: She exhibits no edema or tenderness.       Right shoulder: She exhibits decreased strength.  Right sided paralysis with muscle strength 4/5  Lymphadenopathy:    She has no cervical adenopathy.  Neurological: She is alert. She displays abnormal reflex. No cranial nerve deficit. She exhibits abnormal muscle tone. Coordination abnormal.  Skin: Skin is warm and dry. No rash noted.  S/p right mastectomy. The right mastectomy. A small less than 1cm smooth mobile nose tip consistency nodule palpated mid left fore  arm. A >1cm skin lesion with rough surface,  various shade of brown color, and irregular border. Pain in the left breast reported, radiates to the left chest wall, worsens when she stands up.  Psychiatric: Her mood appears not anxious. Her affect is not angry, not blunt, not labile and not inappropriate. Her speech is delayed and tangential. Her speech is not rapid and/or pressured and not slurred. She is slowed. She is not agitated, not aggressive, not hyperactive, not withdrawn, not actively hallucinating and not combative. Thought content is not paranoid and not delusional. Cognition and memory are impaired. She does not express impulsivity or inappropriate judgment. She does not exhibit a depressed mood. She is communicative. She exhibits abnormal recent memory. She exhibits normal remote memory. She is attentive.     Labs reviewed: Lab Summary Latest Ref Rng 10/05/2014 08/20/2014 06/15/2014 04/20/2014  Hemoglobin 12.0 - 16.0 g/dL 14.0 13.7 13.3 13.9  Hematocrit 36 - 46 % 41 41.4 40 40  White count - 6.0 6.0 6.2 10.3  Platelet count 150 - 399 K/L 223 214 224 232  Sodium 137 - 147 mmol/L 141 140 (None) 137  Potassium 3.4 - 5.3 mmol/L 4.2 3.7 (None) 3.8  Calcium 8.9 - 10.3 mg/dL (None) 9.7 (None) (None)  Phosphorus - (None) (None) (None) (None)  Creatinine 0.5 - 1.1 mg/dL 0.9 1.05(H) (None) 1.0  AST 13 - 35 U/L 15 26 (None) 18  Alk Phos 25 - 125 U/L 103 77 (None) 79  Bilirubin 0.3 - 1.2 mg/dL (None) 0.5 (None) (None)  Glucose - 87 106(H) (None) 95  Cholesterol - (None) (None) (None) (None)  HDL cholesterol - (None) (None) (None) (None)  Triglycerides - (None) (None) (None) (None)  LDL Direct - (None) (None) (None) (None)  LDL Calc - (None) (None) (None) (None)  Total protein 6.5 - 8.1 g/dL (None) 6.1(L) (None) (None)  Albumin 3.5 - 5.0 g/dL (None) 3.7 (None) (None)   Lab Results  Component Value Date   TSH 1.54 10/05/2014   Lab Results  Component Value Date   BUN 12 10/05/2014    Lab Results  Component Value Date   HGBA1C 5.7 04/11/2014    Dg Chest 2 View  08/20/2014  CLINICAL DATA:  Left-sided chest pain. EXAM: CHEST  2 VIEW COMPARISON:  None. FINDINGS: There is tortuosity and calcification of the thoracic aorta. Overall heart size is normal. Slight atelectasis at the left lung base. No consolidative infiltrates or effusions. No acute osseous abnormality. IMPRESSION: Minimal atelectasis at the left lung base.  Aortic atherosclerosis. Electronically Signed   By: Lorriane Shire M.D.   On: 08/20/2014 20:33   Ct Angio Chest Pe W/cm &/or Wo Cm  08/20/2014  CLINICAL DATA:  Chest pain on the right. EXAM: CT ANGIOGRAPHY CHEST WITH CONTRAST TECHNIQUE: Multidetector CT imaging of the chest was performed using the standard protocol during bolus administration of intravenous contrast. Multiplanar CT image reconstructions and MIPs were obtained to evaluate the vascular anatomy. CONTRAST:  20m OMNIPAQUE IOHEXOL 350 MG/ML SOLN COMPARISON:  None. FINDINGS: THORACIC INLET/BODY WALL: Right mastectomy.  No axillary adenopathy. MEDIASTINUM: Mild cardiomegaly. No pericardial effusion. Scattered aortic atherosclerosis. No aortic dissection or aneurysm. No evidence of pulmonary embolism. LUNG WINDOWS: Low lung volumes with patchy dependent opacity likely atelectasis. Bandlike opacities in the right middle lobe and lingula contain internal mineralization and likely reflects postinflammatory scarring. Subpleural scarring also seen at the right greater than left apex, also with calcification. No definitive pneumonia. No edema, effusion, or pneumothorax. Mild cylindrical bronchiectasis in the right upper lobe. UPPER ABDOMEN: No acute findings. OSSEOUS: No definitive explanation for right chest pain. An anterior left third rib sclerotic focus is likely a healed fracture. Review of the MIP images confirms the above findings. IMPRESSION: 1. No evidence of pulmonary embolism or other acute disease. 2.  Dependent atelectasis superimposed on lung scarring. Electronically Signed   By: JMonte FantasiaM.D.   On: 08/20/2014 22:17   10/06/14 [place]: Sludge in GB. Liver normal. Common bile duct 2.9 mm diameter. No abnormal peeritoneal fluid.GB wall is slightly thickened at 2.7 mm  Assessment/Plan  1. RUQ pain - CMP, ESR, CBC, UA -consider referral to GI or surgery and Ct of the abd. depending on lab reports

## 2014-12-16 LAB — HEPATIC FUNCTION PANEL
ALT: 13 U/L (ref 7–35)
AST: 15 U/L (ref 13–35)
Alkaline Phosphatase: 109 U/L (ref 25–125)
Bilirubin, Total: 0.5 mg/dL

## 2014-12-16 LAB — BASIC METABOLIC PANEL
BUN: 12 mg/dL (ref 4–21)
CREATININE: 1 mg/dL (ref 0.5–1.1)
Glucose: 109 mg/dL
Potassium: 3.9 mmol/L (ref 3.4–5.3)
Sodium: 138 mmol/L (ref 137–147)

## 2014-12-16 LAB — CBC AND DIFFERENTIAL
HCT: 42 % (ref 36–46)
Hemoglobin: 14.2 g/dL (ref 12.0–16.0)
PLATELETS: 247 10*3/uL (ref 150–399)
WBC: 5.6 10*3/mL

## 2014-12-25 ENCOUNTER — Emergency Department (HOSPITAL_COMMUNITY)
Admission: EM | Admit: 2014-12-25 | Discharge: 2014-12-26 | Disposition: A | Discharge status: Home / Self Care | Payer: Medicare Other | Attending: Emergency Medicine | Admitting: Emergency Medicine

## 2014-12-25 ENCOUNTER — Encounter (HOSPITAL_COMMUNITY): Payer: Self-pay | Admitting: Emergency Medicine

## 2014-12-25 DIAGNOSIS — R519 Headache, unspecified: Secondary | ICD-10-CM

## 2014-12-25 DIAGNOSIS — F329 Major depressive disorder, single episode, unspecified: Secondary | ICD-10-CM | POA: Insufficient documentation

## 2014-12-25 DIAGNOSIS — I10 Essential (primary) hypertension: Secondary | ICD-10-CM | POA: Insufficient documentation

## 2014-12-25 DIAGNOSIS — Z7901 Long term (current) use of anticoagulants: Secondary | ICD-10-CM | POA: Insufficient documentation

## 2014-12-25 DIAGNOSIS — Z79899 Other long term (current) drug therapy: Secondary | ICD-10-CM | POA: Diagnosis not present

## 2014-12-25 DIAGNOSIS — I159 Secondary hypertension, unspecified: Secondary | ICD-10-CM | POA: Diagnosis not present

## 2014-12-25 DIAGNOSIS — I69398 Other sequelae of cerebral infarction: Secondary | ICD-10-CM | POA: Insufficient documentation

## 2014-12-25 DIAGNOSIS — R51 Headache: Secondary | ICD-10-CM

## 2014-12-25 LAB — URINALYSIS, ROUTINE W REFLEX MICROSCOPIC
Bilirubin Urine: NEGATIVE
GLUCOSE, UA: NEGATIVE mg/dL
HGB URINE DIPSTICK: NEGATIVE
Ketones, ur: NEGATIVE mg/dL
Nitrite: NEGATIVE
PH: 6.5 (ref 5.0–8.0)
PROTEIN: NEGATIVE mg/dL
Specific Gravity, Urine: 1.005 (ref 1.005–1.030)

## 2014-12-25 LAB — URINE MICROSCOPIC-ADD ON

## 2014-12-25 MED ORDER — HYDRALAZINE HCL 20 MG/ML IJ SOLN
10.0000 mg | Freq: Once | INTRAMUSCULAR | Status: AC
  Administered 2014-12-25: 10 mg via INTRAVENOUS
  Filled 2014-12-25: qty 1

## 2014-12-25 MED ORDER — DIPHENHYDRAMINE HCL 50 MG/ML IJ SOLN
25.0000 mg | Freq: Once | INTRAMUSCULAR | Status: AC
  Administered 2014-12-25: 25 mg via INTRAVENOUS
  Filled 2014-12-25: qty 1

## 2014-12-25 MED ORDER — METOCLOPRAMIDE HCL 5 MG/ML IJ SOLN
10.0000 mg | Freq: Once | INTRAMUSCULAR | Status: AC
  Administered 2014-12-25: 10 mg via INTRAVENOUS
  Filled 2014-12-25: qty 2

## 2014-12-25 MED ORDER — SODIUM CHLORIDE 0.9 % IV BOLUS (SEPSIS)
500.0000 mL | Freq: Once | INTRAVENOUS | Status: AC
  Administered 2014-12-25: 500 mL via INTRAVENOUS

## 2014-12-25 MED ORDER — DEXAMETHASONE SODIUM PHOSPHATE 10 MG/ML IJ SOLN
10.0000 mg | Freq: Once | INTRAMUSCULAR | Status: AC
  Administered 2014-12-25: 10 mg via INTRAVENOUS
  Filled 2014-12-25: qty 1

## 2014-12-25 NOTE — ED Notes (Signed)
Per EMS pt reports that she has has a headache all day.  She has a Hx of CVA, aphasia and UTI.  Upon arrival daughter stated that pt complained of left sided pain this morning but stated it went away after taking tylenol before lunch.  EMS stated that they noticed abdomen was tender upon palpitration and pt reacted in pain.

## 2014-12-25 NOTE — ED Provider Notes (Signed)
CSN: [id number]     Arrival date & time 12/25/14  2204 History  By signing my name below, I, Evelene Croon, attest that this documentation has been prepared under the direction and in the presence of Merrily Pew, MD . Electronically Signed: Evelene Croon, Scribe. 12/25/2014. 11:29 PM.    Chief Complaint  Patient presents with  . Headache    The history is provided by the patient and a relative (Daughter). No language interpreter was used.    HPI Comments:  Erica [name] is a 81 y.o. 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 She reports pain throughout her head.  She notes h/o similar HA which improves after taking tylenol. She has taken tylenol today and tramadol with minimal relief. She denies acute weakness, nausea, vomiting, vision changes, and rash. Daughter also denies weakness/changes to gait and recent fall. Pt has no other acute complaints at this time.   Pt is currently on Xarelto. Daughter states she is compliant with HTN meds.   PCP- Nyoka Cowden  Past Medical History  Diagnosis Date  . Hypertension   . Depression   . Stroke [facility])    Past Surgical History  Procedure Laterality Date  . Abdominal hysterectomy    . Appendectomy    . Breast surgery    . Mastectomy     No family history on file. Social History  Substance Use Topics  . Smoking status: Never Smoker   . Smokeless tobacco: Never Used  . Alcohol Use: No   OB History    No data available     Review of Systems  Constitutional: Negative for fever.  Eyes: Negative for visual disturbance.  Gastrointestinal: Negative for nausea and vomiting.  Skin: Negative for rash.  Neurological: Positive for headaches. Negative for facial asymmetry and weakness.  All other systems reviewed and are negative.   Allergies  Hydrocodone; Morphine and related;  Actonel; Darvon; Oxycodone hcl; and Pneumovax  Home Medications   Prior to Admission medications   Medication Sig Start Date End Date Taking? Authorizing Provider  acetaminophen (TYLENOL) 325 MG tablet Take 650 mg by mouth every 4 (four) hours as needed for pain.   Yes Historical Provider, MD  atorvastatin (LIPITOR) 10 MG tablet Take 10 mg by mouth daily.   Yes Historical Provider, MD  fluticasone (FLONASE) 50 MCG/ACT nasal spray Place 1 spray into both nostrils as needed for allergies.    Yes Historical Provider, MD  metoprolol tartrate (LOPRESSOR) 25 MG tablet Take 12.5 mg by mouth 2 (two) times daily.   Yes Historical Provider, MD  mirtazapine (REMERON) 7.5 MG tablet Take 7.5 mg by mouth at bedtime.    Yes Historical Provider, MD  Multiple Vitamins-Minerals (I-VITE) TABS Take 1 tablet by mouth daily.   Yes Historical Provider, MD  omeprazole (PRILOSEC) 20 MG capsule Take 1 capsule (20 mg total) by mouth 2 (two) times daily. 08/21/14  Yes Tanna Furry, MD  polyethylene glycol [facility] / GLYCOLAX) packet Take 17 g by mouth every other day.    Yes Historical Provider, MD  Rivaroxaban (XARELTO) 20 MG TABS tablet Take 20 mg by mouth daily.    Yes Historical Provider, MD  sertraline (ZOLOFT) 25 MG tablet Take 75 mg by mouth daily.   Yes Historical Provider, MD  traMADol (ULTRAM) 50 MG tablet Take 1 tablet (50 mg  total) by mouth every 6 (six) hours as needed. 08/21/14  Yes Tanna Furry, MD   BP 125/66 mmHg  Pulse 64  Temp(Src) 98.8 F (37.1 C) (Rectal)  Resp 12  Ht 5\' 3"  (1.6 m)  Wt 155 lb (70.308 kg)  BMI 27.46 kg/m2  SpO2 93% Physical Exam  Constitutional: She is oriented to person, place, and time. She appears well-developed and well-nourished. No distress.  HENT:  Head: Normocephalic and atraumatic.  Eyes: Conjunctivae are normal.  Cardiovascular: Normal rate, regular rhythm and normal heart sounds.   Pulmonary/Chest: Effort normal and breath sounds normal. No respiratory distress.   Abdominal: She exhibits no distension.  Neurological: She is alert and oriented to person, place, and time. No cranial nerve deficit.  Difficulty following commands Cranial nerves intact  dysmetria with finger to nose, R> L  nml strength in BUE and BLE  nml DTR in bicep, patella, and ankles  nml sensation in BUE and BLE   Skin: Skin is warm and dry.  Psychiatric: She has a normal mood and affect.  Nursing note and vitals reviewed.   ED Course  Procedures   DIAGNOSTIC STUDIES:  Oxygen Saturation is 96% on RA, normal by my interpretation.    COORDINATION OF CARE:  11:16 PM Discussed treatment plan with pt at bedside and pt agreed to plan.  Labs Review Labs Reviewed  COMPREHENSIVE METABOLIC PANEL - Abnormal; Notable for the following:    Glucose, Bld 101 (*)    Total Protein 6.1 (*)    GFR calc non Af Amer 50 (*)    GFR calc Af Amer 58 (*)    All other components within normal limits  URINALYSIS, ROUTINE W REFLEX MICROSCOPIC (NOT AT [facility]) - Abnormal; Notable for the following:    Leukocytes, UA TRACE (*)    All other components within normal limits  URINE MICROSCOPIC-ADD ON - Abnormal; Notable for the following:    Squamous Epithelial / LPF 0-5 (*)    Bacteria, UA RARE (*)    All other components within normal limits  CBC WITH DIFFERENTIAL/PLATELET  LIPASE, BLOOD    Imaging Review Ct Head Wo Contrast  12/26/2014  CLINICAL DATA:  Headache.  Evaluate for intracranial hemorrhage. EXAM: CT HEAD WITHOUT CONTRAST TECHNIQUE: Contiguous axial images were obtained from the base of the skull through the vertex without intravenous contrast. COMPARISON:  None. FINDINGS: Skull and Sinuses:Negative for fracture or destructive process. The visualized mastoids, middle ears, and imaged paranasal sinuses are clear. Visualized orbits: Bilateral cataract resection. Brain: No evidence of acute infarction, hemorrhage, or hydrocephalus. There is gliosis and volume loss throughout most the left  PCA territory consistent with remote infarct. CSF density expansion around the right cerebral convexity could reflect a hygroma, but is indeterminate as there appears to be a traversing vessel. Even if a hygroma, there is no significant mass effect on the right cerebral cortex. IMPRESSION: 1. No acute finding. 2. Remote left PCA territory infarct. Electronically Signed   By: Monte Fantasia M.D.   On: 12/26/2014 01:37   I have personally reviewed and evaluated these images and lab results as part of my medical decision-making.   EKG Interpretation   Date/Time:  Monday December 25 2014 22:19:21 EST Ventricular Rate:  64 PR Interval:  239 QRS Duration: 99 QT Interval:  414 QTC Calculation: 427 R Axis:   -33 Text Interpretation:  Sinus rhythm Prolonged PR interval Left axis  deviation Low voltage, precordial leads Consider anterior infarct  Nonspecific ST and  T wave abnormality Confirmed by Wyvonnia Dusky  MD, STEPHEN  [phone]) on 12/25/2014 10:36:25 PM      MDM   Final diagnoses:  Secondary hypertension, unspecified  Nonintractable headache, unspecified chronicity pattern, unspecified headache type    81 yo F w/ ha likely 2/2 HTN. Ct negative. Labs negative. Has chronic abdominal pain s/p multiple workups. Exam benign aside from residual effects from her previous stroke. Headache improved with BP control to almost nonexistent. Daughter will ask nursing facility to take repeat BP measurements over next few days to ensure no need for BP med adjustments.   I personally performed the services described in this documentation, which was scribed in my presence. The recorded information has been reviewed and is accurate.   Merrily Pew, MD 12/26/14 [phone]

## 2014-12-26 ENCOUNTER — Emergency Department (HOSPITAL_COMMUNITY): Payer: Medicare Other

## 2014-12-26 LAB — CBC WITH DIFFERENTIAL/PLATELET
Basophils Absolute: 0 10*3/uL (ref 0.0–0.1)
Basophils Relative: 1 %
EOS PCT: 3 %
Eosinophils Absolute: 0.2 10*3/uL (ref 0.0–0.7)
HCT: 41.9 % (ref 36.0–46.0)
Hemoglobin: 13.4 g/dL (ref 12.0–15.0)
LYMPHS ABS: 1.9 10*3/uL (ref 0.7–4.0)
LYMPHS PCT: 34 %
MCH: 30.6 pg (ref 26.0–34.0)
MCHC: 32 g/dL (ref 30.0–36.0)
MCV: 95.7 fL (ref 78.0–100.0)
MONO ABS: 0.5 10*3/uL (ref 0.1–1.0)
Monocytes Relative: 9 %
Neutro Abs: 3 10*3/uL (ref 1.7–7.7)
Neutrophils Relative %: 53 %
PLATELETS: 215 10*3/uL (ref 150–400)
RBC: 4.38 MIL/uL (ref 3.87–5.11)
RDW: 14 % (ref 11.5–15.5)
WBC: 5.7 10*3/uL (ref 4.0–10.5)

## 2014-12-26 LAB — COMPREHENSIVE METABOLIC PANEL
ALT: 18 U/L (ref 14–54)
ANION GAP: 10 (ref 5–15)
AST: 22 U/L (ref 15–41)
Albumin: 3.8 g/dL (ref 3.5–5.0)
Alkaline Phosphatase: 111 U/L (ref 38–126)
BUN: 13 mg/dL (ref 6–20)
CHLORIDE: 109 mmol/L (ref 101–111)
CO2: 23 mmol/L (ref 22–32)
Calcium: 9.6 mg/dL (ref 8.9–10.3)
Creatinine, Ser: 1 mg/dL (ref 0.44–1.00)
GFR, EST AFRICAN AMERICAN: 58 mL/min — AB (ref >60)
GFR, EST NON AFRICAN AMERICAN: 50 mL/min — AB (ref >60)
Glucose, Bld: 101 mg/dL — ABNORMAL HIGH (ref 65–99)
POTASSIUM: 3.6 mmol/L (ref 3.5–5.1)
SODIUM: 142 mmol/L (ref 135–145)
Total Bilirubin: 0.6 mg/dL (ref 0.3–1.2)
Total Protein: 6.1 g/dL — ABNORMAL LOW (ref 6.5–8.1)

## 2014-12-26 LAB — LIPASE, BLOOD: LIPASE: 41 U/L (ref 11–51)

## 2014-12-26 MED ORDER — MAGNESIUM SULFATE 2 GM/50ML IV SOLN
2.0000 g | Freq: Once | INTRAVENOUS | Status: AC
  Administered 2014-12-26: 2 g via INTRAVENOUS
  Filled 2014-12-26: qty 50

## 2014-12-26 MED ORDER — HALOPERIDOL LACTATE 5 MG/ML IJ SOLN
2.0000 mg | Freq: Once | INTRAMUSCULAR | Status: AC
  Administered 2014-12-26: 2 mg via INTRAVENOUS
  Filled 2014-12-26: qty 1

## 2014-12-26 MED ORDER — LABETALOL HCL 5 MG/ML IV SOLN
10.0000 mg | Freq: Once | INTRAVENOUS | Status: AC
  Administered 2014-12-26: 10 mg via INTRAVENOUS
  Filled 2014-12-26: qty 4

## 2014-12-26 NOTE — Discharge Instructions (Signed)
Please check your blood pressures three times a day at the same time/situation for the next three days to advise primary doctor of any possible needs for blood pressure medication changes.

## 2014-12-26 NOTE — ED Notes (Signed)
Patient transported to CT 

## 2015-01-29 ENCOUNTER — Encounter: Payer: Self-pay | Admitting: Nurse Practitioner

## 2015-01-29 ENCOUNTER — Non-Acute Institutional Stay: Payer: Medicare Other | Admitting: Nurse Practitioner

## 2015-01-29 DIAGNOSIS — F32A Depression, unspecified: Secondary | ICD-10-CM

## 2015-01-29 DIAGNOSIS — R1013 Epigastric pain: Secondary | ICD-10-CM | POA: Diagnosis not present

## 2015-01-29 DIAGNOSIS — I10 Essential (primary) hypertension: Secondary | ICD-10-CM

## 2015-01-29 DIAGNOSIS — I82409 Acute embolism and thrombosis of unspecified deep veins of unspecified lower extremity: Secondary | ICD-10-CM

## 2015-01-29 DIAGNOSIS — K5900 Constipation, unspecified: Secondary | ICD-10-CM | POA: Diagnosis not present

## 2015-01-29 DIAGNOSIS — F329 Major depressive disorder, single episode, unspecified: Secondary | ICD-10-CM

## 2015-01-29 DIAGNOSIS — J069 Acute upper respiratory infection, unspecified: Secondary | ICD-10-CM

## 2015-01-29 DIAGNOSIS — K219 Gastro-esophageal reflux disease without esophagitis: Secondary | ICD-10-CM | POA: Diagnosis not present

## 2015-01-29 DIAGNOSIS — K5909 Other constipation: Secondary | ICD-10-CM | POA: Insufficient documentation

## 2015-01-29 NOTE — Assessment & Plan Note (Signed)
Controlled Bp, better with URI like symptoms since off Lisinopril, continue Metoprolol 12.5mg bid. Monitor Bp/P daily. 

## 2015-01-29 NOTE — Assessment & Plan Note (Signed)
Better  

## 2015-01-29 NOTE — Assessment & Plan Note (Signed)
nasal congestion, cough, hoarseness, low grade T 99.5, CXR 01/28/15 bibasilar opacities may represent multifocal infectious process, Doxy 100mg  bid x 7 days started.

## 2015-01-29 NOTE — Assessment & Plan Note (Signed)
Mood is stable, continue Sertraline 75mg, Mirtazapine 7.5mg(sleeps well at night and weight has been stabilized 

## 2015-01-29 NOTE — Assessment & Plan Note (Signed)
Stable, continue MiraLax qod 

## 2015-01-29 NOTE — Progress Notes (Signed)
Patient ID: Erica [name], female   DOB: Oct 11, 1927, 82 y.o.   MRN: [id number]  Location:  [facility] Provider:  Marlana Latus NP  Code Status:  DNR Goals of care: Advanced Directive information    Chief Complaint  Patient presents with  . Medical Management of Chronic Issues  . Acute Visit    coughing, nasal congetive, hoarse, T 99.5     HPI: Patient is a 82 y.o. female seen in the [facility] at [facility] today for evaluation of nasal congestion, cough, hoarseness, low grade T 99.5, CXR 01/28/15 bibasilar opacities may represent multifocal infectious process, Doxy 100mg  bid x 7 days started.  constipation(MiraLax daily), chest pain, palpitation, or SOB. No apparent swelling in BLE. Takes Sertraline 75mg  and Mirtazapine 7.5mg  nightly for mood and sleep.   Review of Systems  Constitutional: Positive for fever. Negative for chills and diaphoresis.  HENT: Positive for congestion and hearing loss. Negative for ear discharge and sore throat.   Eyes: Negative for pain, discharge and redness.  Respiratory: Positive for cough. Negative for wheezing.   Cardiovascular: Negative for chest pain, palpitations and leg swelling.  Gastrointestinal: Positive for abdominal pain. Negative for heartburn, nausea, vomiting, diarrhea, constipation and blood in stool.       Epigastric pain palpated  Genitourinary: Positive for frequency. Negative for dysuria, urgency, hematuria and flank pain.  Musculoskeletal: Negative for myalgias, back pain and neck pain.       Left breast pain travels to the right, gravity worsens pain when she stands up  Skin: Negative for rash.       The right mastectomy.  A small less than 1cm smooth mobile nose tip consistency nodule palpated mid left fore arm. A >1cm skin lesion with rough surface,  various shade of brown color, and irregular border represent [facility] dermatology consultation.   Neurological: Negative for dizziness, tremors, seizures, weakness and headaches.    Psychiatric/Behavioral: Positive for memory loss. Negative for depression and hallucinations. The patient is not nervous/anxious.     Past Medical History  Diagnosis Date  . Hypertension   . Depression   . Stroke [facility])     Patient Active Problem List   Diagnosis Date Noted  . Acute upper respiratory infection 01/29/2015  . Constipation 01/29/2015  . RUQ pain 12/14/2014  . Abdominal pain, epigastric 10/04/2014  . TMJ tenderness 04/20/2014  . Mass of left breast on mammogram 12/15/2013  . Sinusitis, chronic 11/30/2013  . Solitary bone cyst of left forearm 11/10/2013  . SCC (squamous cell carcinoma), scalp/neck 11/10/2013  . Shingles 11/24/2012  . GERD (gastroesophageal reflux disease) 09/29/2012  . Protein C deficiency ([place]) 06/14/2012  . Protein S deficiency ([place]) 06/14/2012  . DNR (do not resuscitate) 06/14/2012  . 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 Weight gain 05/10/2012  . Essential hypertension, benign 03/24/2012  . Depression 03/24/2012  . DVT (deep venous thrombosis) ([place]) 03/24/2012  . Dysphagia, unspecified(787.20) 03/24/2012  . CVA (cerebral vascular accident) ([place]) 03/24/2012  . Osteoporosis 03/24/2012  . Macular degeneration 03/24/2012  . Aphasia 03/24/2012  . Insomnia 03/24/2012  . Dyslipidemia 03/24/2012    Allergies  Allergen Reactions  . Hydrocodone Nausea And Vomiting  . Morphine And Related Nausea And Vomiting  . Actonel [Risedronate Sodium] Other (See Comments)    unknown  . Darvon [Propoxyphene Hcl] Other (See Comments)    unknown  . Oxycodone Hcl Other (See Comments)    unknown  . Pneumovax [Pneumococcal Polysaccharide Vaccine] Other (See Comments)    unknown  Medications: Patient's Medications  New Prescriptions   No medications on file  Previous Medications   ACETAMINOPHEN (TYLENOL) 325 MG TABLET    Take 650 mg by mouth every 4 (four) hours as needed for pain.   ATORVASTATIN (LIPITOR) 10 MG TABLET    Take 10 mg by mouth daily.    FLUTICASONE (FLONASE) 50 MCG/ACT NASAL SPRAY    Place 1 spray into both nostrils as needed for allergies.    METOPROLOL TARTRATE (LOPRESSOR) 25 MG TABLET    Take 12.5 mg by mouth 2 (two) times daily.   MIRTAZAPINE (REMERON) 7.5 MG TABLET    Take 7.5 mg by mouth at bedtime.    MULTIPLE VITAMINS-MINERALS (I-VITE) TABS    Take 1 tablet by mouth daily.   OMEPRAZOLE (PRILOSEC) 20 MG CAPSULE    Take 1 capsule (20 mg total) by mouth 2 (two) times daily.   POLYETHYLENE GLYCOL (MIRALAX / GLYCOLAX) PACKET    Take 17 g by mouth every other day.    RIVAROXABAN (XARELTO) 20 MG TABS TABLET    Take 20 mg by mouth daily.    SERTRALINE (ZOLOFT) 25 MG TABLET    Take 75 mg by mouth daily.   TRAMADOL (ULTRAM) 50 MG TABLET    Take 1 tablet (50 mg total) by mouth every 6 (six) hours as needed.  Modified Medications   No medications on file  Discontinued Medications   No medications on file    Physical Exam: Filed Vitals:   01/29/15 1447  BP: 122/76  Pulse: 76  Temp: 99.5 F (37.5 C)  TempSrc: Tympanic  Resp: 20   There is no weight on file to calculate BMI.  Physical Exam  Constitutional: She appears well-developed.  HENT:  Head: Normocephalic and atraumatic.  Left Ear: External ear normal.  Nose: Mucosal edema and rhinorrhea present. Right sinus exhibits no maxillary sinus tenderness and no frontal sinus tenderness. Left sinus exhibits no maxillary sinus tenderness and no frontal sinus tenderness.  Mouth/Throat: Oropharynx is clear and moist. Mucous membranes are not pale, not dry and not cyanotic. No oropharyngeal exudate.  Eyes: Conjunctivae and EOM are normal. Pupils are equal, round, and reactive to light. Right eye exhibits no discharge. Left eye exhibits no discharge.  Neck: Normal range of motion. Neck supple. No JVD present. No thyromegaly present.  Cardiovascular: Normal rate, regular rhythm and normal heart sounds.  Exam reveals no friction rub.   No murmur heard. Pulmonary/Chest:  Effort normal and breath sounds normal. No respiratory distress. She has no wheezes. She has no rales.  Abdominal: Soft. Bowel sounds are normal. She exhibits no distension. There is tenderness. There is no rebound and no guarding.  Epigastric tenderness palpated, negative murphy's sign.   Genitourinary: Vagina normal.  Musculoskeletal: She exhibits no edema or tenderness.       Right shoulder: She exhibits decreased strength.  Right sided paralysis with muscle strength 4/5  Lymphadenopathy:    She has no cervical adenopathy.  Neurological: She is alert. She displays abnormal reflex. No cranial nerve deficit. She exhibits abnormal muscle tone. Coordination abnormal.  Skin: Skin is warm and dry. No rash noted.  S/p right mastectomy. The right mastectomy. A small less than 1cm smooth mobile nose tip consistency nodule palpated mid left fore arm. A >1cm skin lesion with rough surface,  various shade of brown color, and irregular border represent [facility] dermatology consultation.  Pain in the left breast reported, radiates to the left chest wall, worsens when she stands  up    Psychiatric: Her mood appears not anxious. Her affect is not angry, not blunt, not labile and not inappropriate. Her speech is delayed and tangential. Her speech is not rapid and/or pressured and not slurred. She is slowed. She is not agitated, not aggressive, not hyperactive, not withdrawn, not actively hallucinating and not combative. Thought content is not paranoid and not delusional. Cognition and memory are impaired. She does not express impulsivity or inappropriate judgment. She does not exhibit a depressed mood. She is communicative. She exhibits abnormal recent memory. She exhibits normal remote memory. She is attentive.    Labs reviewed: Basic Metabolic Panel:  Recent Labs  08/20/14 2001 10/05/14 12/16/14 12/25/14 2349  NA 140 141 138 142  K 3.7 4.2 3.9 3.6  CL 109  --   --  109  CO2 20*  --   --  23  GLUCOSE  106*  --   --  101*  BUN 14 12 12 13   CREATININE 1.05* 0.9 1.0 1.00  CALCIUM 9.7  --   --  9.6    Liver Function Tests:  Recent Labs  08/20/14 2001 10/05/14 12/16/14 12/25/14 2349  AST 26 15 15 22   ALT 13* 12 13 18   ALKPHOS 77 103 109 111  BILITOT 0.5  --   --  0.6  PROT 6.1*  --   --  6.1*  ALBUMIN 3.7  --   --  3.8    CBC:  Recent Labs  08/20/14 2001 10/05/14 12/16/14 12/25/14 2349  WBC 6.0 6.0 5.6 5.7  NEUTROABS  --   --   --  3.0  HGB 13.7 14.0 14.2 13.4  HCT 41.4 41 42 41.9  MCV 94.1  --   --  95.7  PLT 214 223 247 215    Lab Results  Component Value Date   TSH 1.54 10/05/2014   Lab Results  Component Value Date   HGBA1C 5.7 04/11/2014   No results found for: CHOL, HDL, LDLCALC, LDLDIRECT, TRIG, CHOLHDL  Significant Diagnostic Results since last visit: none  Patient Care Team: Estill Dooms, MD as PCP - General (Internal Medicine)  Assessment/Plan Problem List Items Addressed This Visit    GERD (gastroesophageal reflux disease) (Chronic)    Stable, continue Omeprazole 20mg  daily.       Essential hypertension, benign (Chronic)    Controlled Bp, better with URI like symptoms since off Lisinopril, continue Metoprolol 12.5mg  bid. Monitor Bp/P daily.      DVT (deep venous thrombosis) ([place])    Onset 02/01/2012, left extremity DVT, coumadin treatment should be for 3 months. Off Coumadin. Started Xarelto 20mg  for Protein C/S deficiency.      Depression    Mood is stable, continue Sertraline 75mg , Mirtazapine 7.5mg (sleeps well at night and weight has been stabilized)      Constipation    Stable, continue MiraLax qod      Acute upper respiratory infection - Primary    nasal congestion, cough, hoarseness, low grade T 99.5, CXR 01/28/15 bibasilar opacities may represent multifocal infectious process, Doxy 100mg  bid x 7 days started.       Abdominal pain, epigastric    Better           Family/ staff Communication: observe the patient.    Labs/tests ordered:  CXR done 01/28/15  [facility] Lynnette Pote NP Geriatrics [facility] [address], [place] [address] On Call:  [phone] & follow prompts after 5pm &  weekends Office Phone:  [phone] Office Fax:  [phone]

## 2015-01-29 NOTE — Assessment & Plan Note (Signed)
Onset 02/01/2012, left extremity DVT, coumadin treatment should be for 3 months. Off Coumadin. Started Xarelto 20mg for Protein C/S deficiency. 

## 2015-01-29 NOTE — Assessment & Plan Note (Signed)
Stable, continue Omeprazole 20mg daily.  

## 2015-03-01 ENCOUNTER — Non-Acute Institutional Stay: Payer: Medicare Other | Admitting: Nurse Practitioner

## 2015-03-01 ENCOUNTER — Encounter: Payer: Self-pay | Admitting: Nurse Practitioner

## 2015-03-01 DIAGNOSIS — I10 Essential (primary) hypertension: Secondary | ICD-10-CM

## 2015-03-01 DIAGNOSIS — F329 Major depressive disorder, single episode, unspecified: Secondary | ICD-10-CM | POA: Diagnosis not present

## 2015-03-01 DIAGNOSIS — K219 Gastro-esophageal reflux disease without esophagitis: Secondary | ICD-10-CM | POA: Diagnosis not present

## 2015-03-01 DIAGNOSIS — J069 Acute upper respiratory infection, unspecified: Secondary | ICD-10-CM

## 2015-03-01 DIAGNOSIS — R1013 Epigastric pain: Secondary | ICD-10-CM

## 2015-03-01 DIAGNOSIS — K5900 Constipation, unspecified: Secondary | ICD-10-CM

## 2015-03-01 DIAGNOSIS — F32A Depression, unspecified: Secondary | ICD-10-CM

## 2015-03-01 NOTE — Assessment & Plan Note (Signed)
Stable, continue MiraLax qod 

## 2015-03-01 NOTE — Assessment & Plan Note (Signed)
Mood is stable, continue Sertraline 75mg, Mirtazapine 7.5mg(sleeps well at night and weight has been stabilized 

## 2015-03-01 NOTE — Progress Notes (Signed)
Patient ID: Erica [name], female   DOB: Mar 29, 1927, 82 y.o.   MRN: [id number]  Location:  [place] Room Number: 803 Place of Service:  SNF (31) Provider:  Lennie Odor Manuel Dall NP  GREEN, Viviann Spare, MD  Patient Care Team: Estill Dooms, MD as PCP - General (Internal Medicine)  Extended Emergency Contact Information Primary Emergency Contact: [facility] Address: [id number], [place] [address] [name] of [place] Phone: [phone] Work Phone: [phone] Mobile Phone: [phone] Relation: Daughter  Code Status:  DNR Goals of care: Advanced Directive information Advanced Directives 03/01/2015  Does patient have an advance directive? Yes  Type of Paramedic of [place];Out of facility DNR (pink MOST or yellow form)  Does patient want to make changes to advanced directive? No - Patient declined  Copy of advanced directive(s) in chart? Yes     Chief Complaint  Patient presents with  . Medical Management of Chronic Issues    Routine Visit    HPI:  Pt is a 82 y.o. female seen today for medical management of chronic diseases.     Past Medical History  Diagnosis Date  . Hypertension   . Depression   . Stroke [facility])    Past Surgical History  Procedure Laterality Date  . Abdominal hysterectomy    . Appendectomy    . Breast surgery    . Mastectomy      Allergies  Allergen Reactions  . Hydrocodone Nausea And Vomiting  . Morphine And Related Nausea And Vomiting  . Actonel [Risedronate Sodium] Other (See Comments)    unknown  . Darvon [Propoxyphene Hcl] Other (See Comments)    unknown  . Oxycodone Hcl Other (See Comments)    unknown  . Pneumovax [Pneumococcal Polysaccharide Vaccine] Other (See Comments)    unknown      Medication List       This list is accurate as of: 03/01/15 12:11 PM.  Always use your most recent med list.               acetaminophen 325 MG tablet    Commonly known as:  TYLENOL  Take 650 mg by mouth every 4 (four) hours as needed for pain.     atorvastatin 10 MG tablet  Commonly known as:  LIPITOR  Take 10 mg by mouth daily.     CALTRATE 600+D 600-400 MG-UNIT tablet  Generic drug:  Calcium Carbonate-Vitamin D  Take 1 tablet by mouth 2 (two) times daily.     fluticasone 50 MCG/ACT nasal spray  Commonly known as:  FLONASE  Place 1 spray into both nostrils as needed for allergies.     I-VITE Tabs  Take 1 tablet by mouth daily.     metoprolol tartrate 25 MG tablet  Commonly known as:  LOPRESSOR  Take 12.5 mg by mouth 2 (two) times daily.     mirtazapine 7.5 MG tablet  Commonly known as:  REMERON  Take 7.5 mg by mouth at bedtime.     omeprazole 20 MG capsule  Commonly known as:  PRILOSEC  Take 1 capsule (20 mg total) by mouth 2 (two) times daily.     polyethylene glycol packet  Commonly known as:  MIRALAX / GLYCOLAX  Take 17 g by mouth every other day.     sertraline 25 MG tablet  Commonly known as:  ZOLOFT  Take 75 mg by mouth daily.  traMADol 50 MG tablet  Commonly known as:  ULTRAM  Take 1 tablet (50 mg total) by mouth every 6 (six) hours as needed.     XARELTO 20 MG Tabs tablet  Generic drug:  rivaroxaban  Take 20 mg by mouth daily.        Review of Systems  Constitutional: Negative for fever, chills and diaphoresis.  HENT: Positive for hearing loss. Negative for congestion, ear discharge and sore throat.   Eyes: Negative for pain, discharge and redness.  Respiratory: Negative for cough and wheezing.   Cardiovascular: Negative for chest pain, palpitations and leg swelling.  Gastrointestinal: Negative for nausea, vomiting, abdominal pain, diarrhea, constipation and blood in stool.       Epigastric pain palpated  Genitourinary: Positive for frequency. Negative for dysuria, urgency, hematuria and flank pain.  Musculoskeletal: Negative for myalgias, back pain and neck pain.       Left breast pain travels  to the right, gravity worsens pain when she stands up-resolved  Skin: Negative for rash.       The right mastectomy.  A small less than 1cm smooth mobile nose tip consistency nodule palpated mid left fore arm. A >1cm skin lesion with rough surface,  various shade of brown color, and irregular border represent [facility] dermatology consultation.   Neurological: Negative for dizziness, tremors, seizures, weakness and headaches.  Psychiatric/Behavioral: Negative for hallucinations. The patient is not nervous/anxious.     Immunization History  Administered Date(s) Administered  . Influenza-Unspecified 11/20/2013, 09/26/2014   Pertinent  Health Maintenance Due  Topic Date Due  . DEXA SCAN  07/18/1994  . PNA vac Low Risk Adult (1 of 2 - PCV13) 07/18/1994  . INFLUENZA VACCINE  08/07/2015   No flowsheet data found. Functional Status Survey:    Filed Vitals:   03/01/15 0850  BP: 118/76  Pulse: 72  Temp: 98.2 F (36.8 C)  TempSrc: Oral  Resp: 20  Height: 5\' 3"  (1.6 m)  Weight: 150 lb (68.04 kg)   Body mass index is 26.58 kg/(m^2). Physical Exam  Constitutional: She appears well-developed.  HENT:  Head: Normocephalic and atraumatic.  Left Ear: External ear normal.  Nose: Mucosal edema and rhinorrhea present. Right sinus exhibits no maxillary sinus tenderness and no frontal sinus tenderness. Left sinus exhibits no maxillary sinus tenderness and no frontal sinus tenderness.  Mouth/Throat: Oropharynx is clear and moist. Mucous membranes are not pale, not dry and not cyanotic. No oropharyngeal exudate.  Eyes: Conjunctivae and EOM are normal. Pupils are equal, round, and reactive to light. Right eye exhibits no discharge. Left eye exhibits no discharge.  Neck: Normal range of motion. Neck supple. No JVD present. No thyromegaly present.  Cardiovascular: Normal rate, regular rhythm and normal heart sounds.  Exam reveals no friction rub.   No murmur heard. Pulmonary/Chest: Effort normal and  breath sounds normal. No respiratory distress. She has no wheezes. She has no rales.  Abdominal: Soft. Bowel sounds are normal. She exhibits no distension. There is no tenderness. There is no rebound and no guarding.     Genitourinary: Vagina normal.  Musculoskeletal: She exhibits no edema or tenderness.       Right shoulder: She exhibits decreased strength.  Right sided paralysis with muscle strength 4/5  Lymphadenopathy:    She has no cervical adenopathy.  Neurological: She is alert. She displays abnormal reflex. No cranial nerve deficit. She exhibits abnormal muscle tone. Coordination abnormal.  Skin: Skin is warm and dry. No rash noted.  S/p  right mastectomy. The right mastectomy. A small less than 1cm smooth mobile nose tip consistency nodule palpated mid left fore arm. A >1cm skin lesion with rough surface,  various shade of brown color, and irregular border represent [facility] dermatology consultation.  Pain in the left breast reported, radiates to the left chest wall, worsens when she stands up-resolved    Psychiatric: Her mood appears not anxious. Her affect is not angry, not blunt, not labile and not inappropriate. Her speech is delayed and tangential. Her speech is not rapid and/or pressured and not slurred. She is slowed. She is not agitated, not aggressive, not hyperactive, not withdrawn, not actively hallucinating and not combative. Thought content is not paranoid and not delusional. Cognition and memory are impaired. She does not express impulsivity or inappropriate judgment. She does not exhibit a depressed mood. She is communicative. She exhibits abnormal recent memory. She exhibits normal remote memory. She is attentive.    Labs reviewed:  Recent Labs  08/20/14 2001 10/05/14 12/16/14 12/25/14 2349  NA 140 141 138 142  K 3.7 4.2 3.9 3.6  CL 109  --   --  109  CO2 20*  --   --  23  GLUCOSE 106*  --   --  101*  BUN 14 12 12 13   CREATININE 1.05* 0.9 1.0 1.00  CALCIUM 9.7   --   --  9.6    Recent Labs  08/20/14 2001 10/05/14 12/16/14 12/25/14 2349  AST 26 15 15 22   ALT 13* 12 13 18   ALKPHOS 77 103 109 111  BILITOT 0.5  --   --  0.6  PROT 6.1*  --   --  6.1*  ALBUMIN 3.7  --   --  3.8    Recent Labs  08/20/14 2001 10/05/14 12/16/14 12/25/14 2349  WBC 6.0 6.0 5.6 5.7  NEUTROABS  --   --   --  3.0  HGB 13.7 14.0 14.2 13.4  HCT 41.4 41 42 41.9  MCV 94.1  --   --  95.7  PLT 214 223 247 215   Lab Results  Component Value Date   TSH 1.54 10/05/2014   Lab Results  Component Value Date   HGBA1C 5.7 04/11/2014   No results found for: CHOL, HDL, LDLCALC, LDLDIRECT, TRIG, CHOLHDL  Significant Diagnostic Results in last 30 days:  No results found.  Assessment/Plan  Essential hypertension, benign Controlled Bp, better with URI like symptoms since off Lisinopril, continue Metoprolol 12.5mg  bid. Monitor Bp/P daily.  GERD (gastroesophageal reflux disease) Stable, continue Omeprazole 20mg  daily  Depression Mood is stable, continue Sertraline 75mg , Mirtazapine 7.5mg (sleeps well at night and weight has been stabilized  DVT (deep venous thrombosis) Onset 02/01/2012, left extremity DVT, coumadin treatment should be for 3 months. Off Coumadin. Started Xarelto 20mg  for Protein C/S deficiency.  Abdominal pain, epigastric Resolved.   Acute upper respiratory infection Fully treated with 7 day course of Doxy, healed clinically.   Constipation Stable, continue MiraLax qod    Family/ staff Communication: continue AL for care needs.   Labs/tests ordered:  none

## 2015-03-01 NOTE — Assessment & Plan Note (Signed)
Onset 02/01/2012, left extremity DVT, coumadin treatment should be for 3 months. Off Coumadin. Started Xarelto 20mg for Protein C/S deficiency. 

## 2015-03-01 NOTE — Assessment & Plan Note (Signed)
Fully treated with 7 day course of Doxy, healed clinically.

## 2015-03-01 NOTE — Assessment & Plan Note (Signed)
Controlled Bp, better with URI like symptoms since off Lisinopril, continue Metoprolol 12.5mg bid. Monitor Bp/P daily. 

## 2015-03-01 NOTE — Assessment & Plan Note (Signed)
Resolved

## 2015-03-01 NOTE — Assessment & Plan Note (Signed)
Stable, continue Omeprazole 20mg daily.  

## 2015-04-26 ENCOUNTER — Non-Acute Institutional Stay: Payer: Medicare Other | Admitting: Nurse Practitioner

## 2015-04-26 DIAGNOSIS — D6859 Other primary thrombophilia: Secondary | ICD-10-CM | POA: Diagnosis not present

## 2015-04-26 DIAGNOSIS — R140 Abdominal distension (gaseous): Secondary | ICD-10-CM | POA: Diagnosis not present

## 2015-04-26 DIAGNOSIS — I10 Essential (primary) hypertension: Secondary | ICD-10-CM

## 2015-04-26 DIAGNOSIS — K219 Gastro-esophageal reflux disease without esophagitis: Secondary | ICD-10-CM

## 2015-04-26 DIAGNOSIS — F329 Major depressive disorder, single episode, unspecified: Secondary | ICD-10-CM

## 2015-04-26 DIAGNOSIS — F32A Depression, unspecified: Secondary | ICD-10-CM

## 2015-04-26 NOTE — Progress Notes (Signed)
Patient ID: Erica [name], female   DOB: 02-26-1927, 82 y.o.   MRN: [id number]  Location:  [place] Room Number: 803 Place of Service:  SNF (31) Provider:  Lennie Odor Addisen Chappelle NP  GREEN, Erica Spare, MD  Patient Care Team: Erica Dooms, MD as PCP - General (Internal Medicine)  Extended Emergency Contact Information Primary Emergency Contact: Erica [name] Address: [id number], [place] [address] Erica [name] Phone: [phone] Work Phone: [phone] Mobile Phone: [phone] Relation: Daughter  Code Status:  DNR Goals of care: Advanced Directive information Advanced Directives 04/26/2015  Does patient have an advance directive? Yes  Type of Paramedic of [place];Out of facility DNR (pink MOST or yellow form)  Does patient want to make changes to advanced directive? No - Patient declined  Copy of advanced directive(s) in chart? Yes     Chief Complaint  Patient presents with  . Medical Management of Chronic Issues    Routine Visit    HPI:  Pt is a 82 y.o. female seen today for medical management of chronic diseases.  C/o bloated especially after dinner. Hx of GERD, stable while on Omeprazole 20mg , blood pressure is controlled on Metoprolol 12.5mg  bid, sleep and eats well, mood is stable, taking Zoloft 75mg , Mirtazapine 7.5mg . Hx of DVT, protein C deficiency, long term anticoagulation with Xarelto.    Past Medical History  Diagnosis Date  . Hypertension   . Depression   . Stroke [facility])    Past Surgical History  Procedure Laterality Date  . Abdominal hysterectomy    . Appendectomy    . Breast surgery    . Mastectomy      Allergies  Allergen Reactions  . Hydrocodone Nausea And Vomiting  . Morphine And Related Nausea And Vomiting  . Actonel [Risedronate Sodium] Other (See Comments)    unknown  . Darvon [Propoxyphene Hcl] Other (See Comments)    unknown  . Oxycodone Hcl  Other (See Comments)    unknown  . Pneumovax [Pneumococcal Polysaccharide Vaccine] Other (See Comments)    unknown      Medication List       This list is accurate as of: 04/26/15 12:30 PM.  Always use your most recent med list.               acetaminophen 325 MG tablet  Commonly known as:  TYLENOL  Take 650 mg by mouth every 4 (four) hours as needed for pain.     atorvastatin 10 MG tablet  Commonly known as:  LIPITOR  Take 10 mg by mouth daily.     CALTRATE 600+D 600-400 MG-UNIT tablet  Generic drug:  Calcium Carbonate-Vitamin D  Take 1 tablet by mouth 2 (two) times daily.     fluticasone 50 MCG/ACT nasal spray  Commonly known as:  FLONASE  Place 1 spray into both nostrils as needed for allergies.     I-VITE Tabs  Take 1 tablet by mouth daily.     metoprolol tartrate 25 MG tablet  Commonly known as:  LOPRESSOR  Take 12.5 mg by mouth 2 (two) times daily.     mirtazapine 7.5 MG tablet  Commonly known as:  REMERON  Take 7.5 mg by mouth at bedtime.     omeprazole 20 MG capsule  Commonly known as:  PRILOSEC  Take 20 mg by mouth daily.     polyethylene glycol packet  Commonly known  as:  MIRALAX / GLYCOLAX  Take 17 g by mouth every other day.     sertraline 25 MG tablet  Commonly known as:  ZOLOFT  Take 75 mg by mouth daily.     UNABLE TO FIND  Take 5 mLs by mouth 4 (four) times daily -  before meals and at bedtime. Med Name: Magic Mouthwash as needed     XARELTO 20 MG Tabs tablet  Generic drug:  rivaroxaban  Take 20 mg by mouth daily. Reported on 04/26/2015        Review of Systems  Constitutional: Negative for fever, chills and diaphoresis.  HENT: Positive for hearing loss. Negative for congestion, ear discharge and sore throat.   Eyes: Negative for pain, discharge and redness.  Respiratory: Negative for cough and wheezing.   Cardiovascular: Negative for chest pain, palpitations and leg swelling.  Gastrointestinal: Negative for nausea, vomiting,  abdominal pain, diarrhea, constipation and blood in stool.       Epigastric pain in the past, Omeprazole helped. Now c/o bloating after meals especially after dinner.,   Genitourinary: Positive for frequency. Negative for dysuria, urgency, hematuria and flank pain.  Musculoskeletal: Negative for myalgias, back pain and neck pain.       Left breast pain travels to the right, gravity worsens pain when she stands up-resolved  Skin: Negative for rash.       The right mastectomy.  A small less than 1cm smooth mobile nose tip consistency nodule palpated mid left fore arm. A >1cm skin lesion with rough surface,  various shade of brown color, and irregular border represent [facility]a [name] dermatology consultation.   Neurological: Negative for dizziness, tremors, seizures, weakness and headaches.  Psychiatric/Behavioral: Negative for hallucinations. The patient is not nervous/anxious.     Immunization History  Administered Date(s) Administered  . Influenza-Unspecified 11/20/2013, 09/26/2014   Pertinent  Health Maintenance Due  Topic Date Due  . DEXA SCAN  07/18/1994  . PNA vac Low Risk Adult (1 of 2 - PCV13) 07/18/1994  . INFLUENZA VACCINE  08/07/2015   No flowsheet data found. Functional Status Survey:    There were no vitals filed for this visit. There is no weight on file to calculate BMI. Physical Exam  Constitutional: She appears well-developed.  HENT:  Head: Normocephalic and atraumatic.  Left Ear: External ear normal.  Nose: Mucosal edema and rhinorrhea present. Right sinus exhibits no maxillary sinus tenderness and no frontal sinus tenderness. Left sinus exhibits no maxillary sinus tenderness and no frontal sinus tenderness.  Mouth/Throat: Oropharynx is clear and moist. Mucous membranes are not pale, not dry and not cyanotic. No oropharyngeal exudate.  Eyes: Conjunctivae and EOM are normal. Pupils are equal, round, and reactive to light. Right eye exhibits no discharge. Left eye exhibits  no discharge.  Neck: Normal range of motion. Neck supple. No JVD present. No thyromegaly present.  Cardiovascular: Normal rate, regular rhythm and normal heart sounds.  Exam reveals no friction rub.   No murmur heard. Pulmonary/Chest: Effort normal and breath sounds normal. No respiratory distress. She has no wheezes. She has no rales.  Abdominal: Soft. Bowel sounds are normal. She exhibits no distension. There is no tenderness. There is no rebound and no guarding.     Genitourinary: Vagina normal.  Musculoskeletal: She exhibits no edema or tenderness.       Right shoulder: She exhibits decreased strength.  Right sided paralysis with muscle strength 4/5  Lymphadenopathy:    She has no cervical adenopathy.  Neurological: She  is alert. She displays abnormal reflex. No cranial nerve deficit. She exhibits abnormal muscle tone. Coordination abnormal.  Skin: Skin is warm and dry. No rash noted.  S/p right mastectomy. The right mastectomy. A small less than 1cm smooth mobile nose tip consistency nodule palpated mid left fore arm. A >1cm skin lesion with rough surface,  various shade of brown color, and irregular border represent [facility] dermatology consultation.  Pain in the left breast reported, radiates to the left chest wall, worsens when she stands up-resolved    Psychiatric: Her mood appears not anxious. Her affect is not angry, not blunt, not labile and not inappropriate. Her speech is delayed and tangential. Her speech is not rapid and/or pressured and not slurred. She is slowed. She is not agitated, not aggressive, not hyperactive, not withdrawn, not actively hallucinating and not combative. Thought content is not paranoid and not delusional. Cognition and memory are impaired. She does not express impulsivity or inappropriate judgment. She does not exhibit a depressed mood. She is communicative. She exhibits abnormal recent memory. She exhibits normal remote memory. She is attentive.    Labs  reviewed:  Recent Labs  08/20/14 2001 10/05/14 12/16/14 12/25/14 2349  NA 140 141 138 142  K 3.7 4.2 3.9 3.6  CL 109  --   --  109  CO2 20*  --   --  23  GLUCOSE 106*  --   --  101*  BUN 14 12 12 13   CREATININE 1.05* 0.9 1.0 1.00  CALCIUM 9.7  --   --  9.6    Recent Labs  08/20/14 2001 10/05/14 12/16/14 12/25/14 2349  AST 26 15 15 22   ALT 13* 12 13 18   ALKPHOS 77 103 109 111  BILITOT 0.5  --   --  0.6  PROT 6.1*  --   --  6.1*  ALBUMIN 3.7  --   --  3.8    Recent Labs  08/20/14 2001 10/05/14 12/16/14 12/25/14 2349  WBC 6.0 6.0 5.6 5.7  NEUTROABS  --   --   --  3.0  HGB 13.7 14.0 14.2 13.4  HCT 41.4 41 42 41.9  MCV 94.1  --   --  95.7  PLT 214 223 247 215   Lab Results  Component Value Date   TSH 1.54 10/05/2014   Lab Results  Component Value Date   HGBA1C 5.7 04/11/2014   No results found for: CHOL, HDL, LDLCALC, LDLDIRECT, TRIG, CHOLHDL  Significant Diagnostic Results in last 30 days:  No results found.  Assessment/Plan  Abdominal bloating C/o bloated after meals, especially after dinner, will try Simethicone 80mg  after meals and hx.   Depression Mood is stable, continue Sertraline 75mg , Mirtazapine 7.5mg (sleeps well at night and weight has been stabilized   Essential hypertension, benign Controlled, continue Metoprolol 12.5mg  bid. Monitor Bp/P daily.   GERD (gastroesophageal reflux disease) Stable, continue Omeprazole 20mg  daily   Protein C deficiency Continue Xarelto 20mg  daily    Family/ staff Communication: continue AL for care needs.   Labs/tests ordered:  none

## 2015-04-26 NOTE — Assessment & Plan Note (Signed)
Mood is stable, continue Sertraline 75mg, Mirtazapine 7.5mg(sleeps well at night and weight has been stabilized 

## 2015-04-26 NOTE — Assessment & Plan Note (Signed)
Continue Xarelto 20mg daily.

## 2015-04-26 NOTE — Assessment & Plan Note (Signed)
C/o bloated after meals, especially after dinner, will try Simethicone 80mg  after meals and hx.

## 2015-04-26 NOTE — Assessment & Plan Note (Signed)
Stable, continue Omeprazole 20mg daily.  

## 2015-04-26 NOTE — Assessment & Plan Note (Signed)
Controlled, continue Metoprolol 12.5mg bid. Monitor Bp/P daily 

## 2015-06-12 LAB — HEPATIC FUNCTION PANEL
ALK PHOS: 100 U/L (ref 25–125)
ALT: 12 U/L (ref 7–35)
AST: 15 U/L (ref 13–35)
Bilirubin, Total: 0.4 mg/dL

## 2015-06-12 LAB — BASIC METABOLIC PANEL
BUN: 11 mg/dL (ref 4–21)
CREATININE: 0.9 mg/dL (ref <=1.1)
Glucose: 82 mg/dL
Potassium: 3.8 mmol/L (ref 3.4–5.3)
SODIUM: 140 mmol/L (ref 137–147)

## 2015-07-19 LAB — CBC AND DIFFERENTIAL
HCT: 28 % — AB (ref 36–46)
HEMOGLOBIN: 8.9 g/dL — AB (ref 12.0–16.0)
Platelets: 501 10*3/uL — AB (ref 150–399)
WBC: 5.3 10^3/mL

## 2015-07-19 LAB — PROTIME-INR: Protime: 23.4 seconds — AB (ref 10.0–13.8)

## 2015-07-19 LAB — POCT INR: INR: 2.3 — AB (ref <=1.1)

## 2015-07-26 ENCOUNTER — Encounter: Payer: Self-pay | Admitting: Nurse Practitioner

## 2015-07-26 DIAGNOSIS — N390 Urinary tract infection, site not specified: Secondary | ICD-10-CM | POA: Insufficient documentation

## 2015-08-01 ENCOUNTER — Encounter: Payer: Self-pay | Admitting: Nurse Practitioner

## 2015-08-01 ENCOUNTER — Non-Acute Institutional Stay: Payer: Medicare Other | Admitting: Nurse Practitioner

## 2015-08-01 DIAGNOSIS — F329 Major depressive disorder, single episode, unspecified: Secondary | ICD-10-CM

## 2015-08-01 DIAGNOSIS — I82401 Acute embolism and thrombosis of unspecified deep veins of right lower extremity: Secondary | ICD-10-CM

## 2015-08-01 DIAGNOSIS — N390 Urinary tract infection, site not specified: Secondary | ICD-10-CM

## 2015-08-01 DIAGNOSIS — I6300 Cerebral infarction due to thrombosis of unspecified precerebral artery: Secondary | ICD-10-CM

## 2015-08-01 DIAGNOSIS — K5900 Constipation, unspecified: Secondary | ICD-10-CM

## 2015-08-01 DIAGNOSIS — F32A Depression, unspecified: Secondary | ICD-10-CM

## 2015-08-01 DIAGNOSIS — D638 Anemia in other chronic diseases classified elsewhere: Secondary | ICD-10-CM | POA: Diagnosis not present

## 2015-08-01 DIAGNOSIS — K2100 Gastro-esophageal reflux disease with esophagitis, without bleeding: Secondary | ICD-10-CM

## 2015-08-01 DIAGNOSIS — I10 Essential (primary) hypertension: Secondary | ICD-10-CM | POA: Diagnosis not present

## 2015-08-01 NOTE — Assessment & Plan Note (Signed)
Stable, continue MiraLax qod 

## 2015-08-01 NOTE — Assessment & Plan Note (Signed)
Stable, continue Omeprazole 20mg daily.  

## 2015-08-01 NOTE — Progress Notes (Signed)
Location:   [facility] Room Number: 803 Place of Service:  SNF (31) Provider:  Marri Mcneff NP  Jeanmarie Hubert, MD  Patient Care Team: Estill Dooms, MD as PCP - General (Internal Medicine) Garrell Flagg Otho Darner, NP as Nurse Practitioner (Internal Medicine)  Extended Emergency Contact Information Primary Emergency Contact: [facility] Address: [id number] [place],  [address] [name] of [place] Phone: [phone] Work Phone: [phone] Mobile Phone: [phone] Relation: Daughter  Code Status: DNR Goals of care: Advanced Directive information Advanced Directives 08/01/2015  Does patient have an advance directive? Yes  Type of Advance Directive Out of facility DNR (pink MOST or yellow form);[place];Living will  Does patient want to make changes to advanced directive? No - Patient declined  Copy of advanced directive(s) in chart? Yes     Chief Complaint  Patient presents with  . Medical Management of Chronic Issues    HPI:  Pt is a 82 y.o. female seen today for medical management of chronic diseases.  Hx of GERD, stable while on Omeprazole 20mg , blood pressure is controlled on Metoprolol 12.5mg  bid, sleep and eats well, mood is stable, taking Zoloft 75mg , Mirtazapine 7.5mg . Hx of DVT, protein C deficiency, long term anticoagulation with Xarelto.     Past Medical History:  Diagnosis Date  . Depression   . Hypertension   . Stroke [facility])    Past Surgical History:  Procedure Laterality Date  . ABDOMINAL HYSTERECTOMY    . APPENDECTOMY    . BREAST SURGERY    . MASTECTOMY      Allergies  Allergen Reactions  . Hydrocodone Nausea And Vomiting  . Morphine And Related Nausea And Vomiting  . Actonel [Risedronate Sodium] Other (See Comments)    unknown  . Darvon [Propoxyphene Hcl] Other (See Comments)    unknown  . Oxycodone Hcl Other (See Comments)    unknown  . Pneumovax [Pneumococcal  Polysaccharide Vaccine] Other (See Comments)    unknown      Medication List       Accurate as of 08/01/15 11:59 PM. Always use your most recent med list.          acetaminophen 325 MG tablet Commonly known as:  TYLENOL Take 650 mg by mouth every 4 (four) hours as needed for pain.   atorvastatin 10 MG tablet Commonly known as:  LIPITOR Take 10 mg by mouth daily.   CALTRATE 600+D 600-400 MG-UNIT tablet Generic drug:  Calcium Carbonate-Vitamin D Take 1 tablet by mouth 2 (two) times daily.   fluticasone 50 MCG/ACT nasal spray Commonly known as:  FLONASE Place 1 spray into both nostrils as needed for allergies.   hydrocortisone 2.5 % cream Apply 1 application topically 2 (two) times daily. For itchy rash   I-VITE Tabs Take 1 tablet by mouth daily.   metoprolol tartrate 12.5 mg Tabs tablet Commonly known as:  LOPRESSOR Take 12.5 mg by mouth 2 (two) times daily. For HTN   mirtazapine 7.5 MG tablet Commonly known as:  REMERON Take 7.5 mg by mouth at bedtime. For depressive disorder   nystatin powder Generic drug:  nystatin Apply topically as needed. Under left breast   omeprazole 20 MG capsule Commonly known as:  PRILOSEC Take 20 mg by mouth daily.   polyethylene glycol packet Commonly known as:  MIRALAX / GLYCOLAX Take 17 g by mouth every other day.   sertraline 25 MG tablet  Commonly known as:  ZOLOFT Take 75 mg by mouth daily.   traMADol 50 MG tablet Commonly known as:  ULTRAM Take 50 mg by mouth every 6 (six) hours as needed (while awake).   UNABLE TO FIND Take 5 mLs by mouth 4 (four) times daily -  before meals and at bedtime. Med Name: Magic Mouthwash as needed   XARELTO 20 MG Tabs tablet Generic drug:  rivaroxaban Take 20 mg by mouth daily. Reported on 04/26/2015       Review of Systems  Constitutional: Negative for chills, diaphoresis and fever.  HENT: Positive for hearing loss. Negative for congestion, ear discharge and sore throat.   Eyes:  Negative for pain, discharge and redness.  Respiratory: Negative for cough and wheezing.   Cardiovascular: Negative for chest pain, palpitations and leg swelling.  Gastrointestinal: Negative for abdominal pain, blood in stool, constipation, diarrhea, nausea and vomiting.       Epigastric pain in the past, Omeprazole helped. Now c/o bloating after meals especially after dinner.,   Genitourinary: Positive for frequency. Negative for dysuria, flank pain, hematuria and urgency.  Musculoskeletal: Negative for back pain, myalgias and neck pain.       Left breast pain travels to the right, gravity worsens pain when she stands up-resolved  Skin: Negative for rash.       The right mastectomy.  A small less than 1cm smooth mobile nose tip consistency nodule palpated mid left fore arm. A >1cm skin lesion with rough surface,  various shade of brown color, and irregular border represent [facility] dermatology consultation.   Neurological: Negative for dizziness, tremors, seizures, weakness and headaches.  Psychiatric/Behavioral: Negative for hallucinations. The patient is not nervous/anxious.     Immunization History  Administered Date(s) Administered  . Influenza-Unspecified 11/20/2013, 09/26/2014   Pertinent  Health Maintenance Due  Topic Date Due  . DEXA SCAN  07/18/1994  . PNA vac Low Risk Adult (1 of 2 - PCV13) 07/18/1994  . INFLUENZA VACCINE  08/07/2015   No flowsheet data found. Functional Status Survey:    Vitals:   08/01/15 0931  BP: 106/66  Pulse: 62  Resp: (!) 22  Temp: 97.7 F (36.5 C)  Weight: 157 lb (71.2 kg)  Height: 5\' 3"  (1.6 m)   Body mass index is 27.81 kg/m. Physical Exam  Constitutional: She appears well-developed.  HENT:  Head: Normocephalic and atraumatic.  Left Ear: External ear normal.  Nose: Mucosal edema and rhinorrhea present. Right sinus exhibits no maxillary sinus tenderness and no frontal sinus tenderness. Left sinus exhibits no maxillary sinus tenderness  and no frontal sinus tenderness.  Mouth/Throat: Oropharynx is clear and moist. Mucous membranes are not pale, not dry and not cyanotic. No oropharyngeal exudate.  Eyes: Conjunctivae and EOM are normal. Pupils are equal, round, and reactive to light. Right eye exhibits no discharge. Left eye exhibits no discharge.  Neck: Normal range of motion. Neck supple. No JVD present. No thyromegaly present.  Cardiovascular: Normal rate, regular rhythm and normal heart sounds.  Exam reveals no friction rub.   No murmur heard. Pulmonary/Chest: Effort normal and breath sounds normal. No respiratory distress. She has no wheezes. She has no rales.  Abdominal: Soft. Bowel sounds are normal. She exhibits no distension. There is no tenderness. There is no rebound and no guarding.     Genitourinary: Vagina normal.  Musculoskeletal: She exhibits no edema or tenderness.       Right shoulder: She exhibits decreased strength.  Right sided paralysis with  muscle strength 4/5  Lymphadenopathy:    She has no cervical adenopathy.  Neurological: She is alert. She displays abnormal reflex. No cranial nerve deficit. She exhibits abnormal muscle tone. Coordination abnormal.  Skin: Skin is warm and dry. No rash noted.  S/p right mastectomy. The right mastectomy. A small less than 1cm smooth mobile nose tip consistency nodule palpated mid left fore arm. A >1cm skin lesion with rough surface,  various shade of brown color, and irregular border represent [facility] dermatology consultation.  Pain in the left breast reported, radiates to the left chest wall, worsens when she stands up-resolved    Psychiatric: Her mood appears not anxious. Her affect is not angry, not blunt, not labile and not inappropriate. Her speech is delayed and tangential. Her speech is not rapid and/or pressured and not slurred. She is slowed. She is not agitated, not aggressive, not hyperactive, not withdrawn, not actively hallucinating and not combative.  Thought content is not paranoid and not delusional. Cognition and memory are impaired. She does not express impulsivity or inappropriate judgment. She does not exhibit a depressed mood. She is communicative. She exhibits abnormal recent memory. She exhibits normal remote memory. She is attentive.    Labs reviewed:  Recent Labs  08/20/14 2001  12/25/14 2349 06/12/15 08/02/15  NA 140  < > 142 140 139  K 3.7  < > 3.6 3.8 3.5  CL 109  --  109  --   --   CO2 20*  --  23  --   --   GLUCOSE 106*  --  101*  --   --   BUN 14  < > 13 11 11   CREATININE 1.05*  < > 1.00 0.9 0.7  CALCIUM 9.7  --  9.6  --   --   < > = values in this interval not displayed.  Recent Labs  08/20/14 2001  12/25/14 2349 06/12/15 08/02/15  AST 26  < > 22 15 20   ALT 13*  < > 18 12 16   ALKPHOS 77  < > 111 100 107  BILITOT 0.5  --  0.6  --   --   PROT 6.1*  --  6.1*  --   --   ALBUMIN 3.7  --  3.8  --   --   < > = values in this interval not displayed.  Recent Labs  08/20/14 2001  12/25/14 2349 07/19/15 08/02/15  WBC 6.0  < > 5.7 5.3 7.3  NEUTROABS  --   --  3.0  --   --   HGB 13.7  < > 13.4 8.9* 14.6  HCT 41.4  < > 41.9 28* 44  MCV 94.1  --  95.7  --   --   PLT 214  < > 215 501* 254  < > = values in this interval not displayed. Lab Results  Component Value Date   TSH 1.21 08/02/2015   Lab Results  Component Value Date   HGBA1C 5.5 08/02/2015   No results found for: CHOL, HDL, LDLCALC, LDLDIRECT, TRIG, CHOLHDL  Significant Diagnostic Results in last 30 days:  No results found.  Assessment/Plan There are no diagnoses linked to this encounter. Essential hypertension, benign Controlled, continue Metoprolol 12.5mg  bid. Monitor Bp/P daily.    DVT (deep venous thrombosis) Onset 02/01/2012, left extremity DVT, coumadin treatment should be for 3 months. Off Coumadin. Started Xarelto 20mg  for Protein C/S deficiency.   CVA (cerebral vascular accident) Left parietal and occipital hemorrhagic stroke  11/2011   GERD (gastroesophageal reflux disease) Stable, continue Omeprazole 20mg  daily    Constipation Stable, continue MiraLax qod   Depression Mood is stable, continue Sertraline 75mg , Mirtazapine 7.5mg (sleeps well at night and weight has been stabilized    UTI (urinary tract infection) 07/19/15 urine culture K. Pneumoniae 70,000c/ml, 7 day course of cipro 250mg  bid completed.  08/10/15 Urine culture K. Pneumoniae >100,000c/ml, 7 day course of Nitrofurantoin 100mg  bid started.       Family/ staff Communication: continue AL   Labs/tests ordered:  CBC, CMP, TSH, Hgb A1c

## 2015-08-01 NOTE — Assessment & Plan Note (Signed)
Controlled, continue Metoprolol 12.5mg bid. Monitor Bp/P daily 

## 2015-08-01 NOTE — Assessment & Plan Note (Signed)
Left parietal and occipital hemorrhagic stroke 11/2011

## 2015-08-01 NOTE — Assessment & Plan Note (Signed)
Mood is stable, continue Sertraline 75mg, Mirtazapine 7.5mg(sleeps well at night and weight has been stabilized 

## 2015-08-01 NOTE — Assessment & Plan Note (Signed)
Onset 02/01/2012, left extremity DVT, coumadin treatment should be for 3 months. Off Coumadin. Started Xarelto 20mg for Protein C/S deficiency. 

## 2015-08-02 LAB — HEPATIC FUNCTION PANEL
ALT: 16 U/L (ref 7–35)
AST: 20 U/L (ref 13–35)
Alkaline Phosphatase: 107 U/L (ref 25–125)
Bilirubin, Total: 0.5 mg/dL

## 2015-08-02 LAB — TSH: TSH: 1.21 u[IU]/mL (ref <=5.90)

## 2015-08-02 LAB — BASIC METABOLIC PANEL
BUN: 11 mg/dL (ref 4–21)
Creatinine: 0.7 mg/dL (ref <=1.1)
GLUCOSE: 142 mg/dL
POTASSIUM: 3.5 mmol/L (ref 3.4–5.3)
SODIUM: 139 mmol/L (ref 137–147)

## 2015-08-02 LAB — CBC AND DIFFERENTIAL
HCT: 44 % (ref 36–46)
Hemoglobin: 14.6 g/dL (ref 12.0–16.0)
Platelets: 254 10*3/uL (ref 150–399)
WBC: 7.3 10^3/mL

## 2015-08-02 LAB — HEMOGLOBIN A1C: Hemoglobin A1C: 5.5

## 2015-08-08 ENCOUNTER — Other Ambulatory Visit: Payer: Self-pay | Admitting: *Deleted

## 2015-08-10 NOTE — Assessment & Plan Note (Signed)
07/19/15 urine culture K. Pneumoniae 70,000c/ml, 7 day course of cipro 250mg  bid completed.  08/10/15 Urine culture K. Pneumoniae >100,000c/ml, 7 day course of Nitrofurantoin 100mg  bid started.

## 2015-09-06 ENCOUNTER — Encounter: Payer: Self-pay | Admitting: Internal Medicine

## 2015-09-06 ENCOUNTER — Non-Acute Institutional Stay: Payer: Medicare Other | Admitting: Internal Medicine

## 2015-09-06 DIAGNOSIS — I10 Essential (primary) hypertension: Secondary | ICD-10-CM | POA: Diagnosis not present

## 2015-09-06 DIAGNOSIS — F329 Major depressive disorder, single episode, unspecified: Secondary | ICD-10-CM | POA: Diagnosis not present

## 2015-09-06 DIAGNOSIS — I6300 Cerebral infarction due to thrombosis of unspecified precerebral artery: Secondary | ICD-10-CM

## 2015-09-06 DIAGNOSIS — R1310 Dysphagia, unspecified: Secondary | ICD-10-CM | POA: Diagnosis not present

## 2015-09-06 DIAGNOSIS — F32A Depression, unspecified: Secondary | ICD-10-CM

## 2015-09-06 NOTE — Progress Notes (Signed)
Progress Note    Location:   [facility] Room Number: [phone] Place of Service:  ALF [phone]) Provider:  Jeanmarie Hubert, MD  Patient Care Team: Estill Dooms, MD as PCP - General (Internal Medicine) Man Otho Darner, NP as Nurse Practitioner (Internal Medicine)  Extended Emergency Contact Information Primary Emergency Contact: [facility] Address: [id number] [place], [place] [address] [name] of [place] Phone: [phone] Work Phone: [phone] Mobile Phone: [phone] Relation: Daughter  Code Status:  DNR Goals of care: Advanced Directive information Advanced Directives 09/06/2015  Does patient have an advance directive? Yes  Type of Paramedic of [place];Living will;Out of facility DNR (pink MOST or yellow form)  Does patient want to make changes to advanced directive? -  Copy of advanced directive(s) in chart? Yes     Chief Complaint  Patient presents with  . Medical Management of Chronic Issues    routine    HPI:  Pt is a 82 y.o. female seen today for medical management of chronic diseases.  Patient was last seen 08/01/2015 by Marlana Latus, NP. Problems evaluated at that time included hypertension, history of DVT on Coumadin, prior CVA in 2013, GERD on omeprazole, constipation on MiraLAX, depression on sertraline and mirtazapine, and UTI with Klebsiella pneumonia on 08/10/2015. Patient appear to be stable in regards to all problems were encountered.  Today, patient says she is feeling well and has no specific complaints.   Past Medical History:  Diagnosis Date  . Depression   . Hypertension   . Stroke [facility])    Past Surgical History:  Procedure Laterality Date  . ABDOMINAL HYSTERECTOMY    . APPENDECTOMY    . BREAST SURGERY    . MASTECTOMY      Allergies  Allergen Reactions  . Hydrocodone Nausea And Vomiting  . Morphine And Related Nausea And Vomiting  . Actonel [Risedronate  Sodium] Other (See Comments)    unknown  . Darvon [Propoxyphene Hcl] Other (See Comments)    unknown  . Oxycodone Hcl Other (See Comments)    unknown  . Pneumovax [Pneumococcal Polysaccharide Vaccine] Other (See Comments)    unknown      Medication List       Accurate as of 09/06/15 11:44 AM. Always use your most recent med list.          acetaminophen 325 MG tablet Commonly known as:  TYLENOL Take 650 mg by mouth every 4 (four) hours as needed for pain.   atorvastatin 10 MG tablet Commonly known as:  LIPITOR Take 10 mg by mouth daily.   CALTRATE 600+D 600-400 MG-UNIT tablet Generic drug:  Calcium Carbonate-Vitamin D Take 1 tablet by mouth 2 (two) times daily.   fluticasone 50 MCG/ACT nasal spray Commonly known as:  FLONASE Place 1 spray into both nostrils as needed for allergies.   GAS RELIEF 80 PO Take by mouth. Take one tablet after meals and at bedtime for gas relief   hydrocortisone 2.5 % cream Apply 1 application topically 2 (two) times daily. For itchy rash   I-VITE Tabs Take 1 tablet by mouth daily.   metoprolol tartrate 12.5 mg Tabs tablet Commonly known as:  LOPRESSOR Take 12.5 mg by mouth 2 (two) times daily. For HTN   mirtazapine 7.5 MG tablet Commonly known as:  REMERON Take 7.5 mg by mouth at bedtime. For depressive disorder   nystatin powder Generic drug:  nystatin Apply topically as needed. Under left breast   omeprazole 20 MG capsule Commonly known as:  PRILOSEC Take 20 mg by mouth daily.   polyethylene glycol packet Commonly known as:  MIRALAX / GLYCOLAX Take 17 g by mouth every other day.   sertraline 25 MG tablet Commonly known as:  ZOLOFT Take 75 mg by mouth daily.   UNABLE TO FIND Take 5 mLs by mouth 4 (four) times daily -  before meals and at bedtime. Med Name: Magic Mouthwash as needed   XARELTO 20 MG Tabs tablet Generic drug:  rivaroxaban Take 20 mg by mouth daily. Reported on 04/26/2015       Review of Systems    Constitutional: Negative for chills, diaphoresis and fever.  HENT: Positive for hearing loss. Negative for congestion, ear discharge and sore throat.   Eyes: Negative for pain, discharge and redness.  Respiratory: Negative for cough and wheezing.   Cardiovascular: Negative for chest pain, palpitations and leg swelling.  Gastrointestinal: Negative for abdominal pain, blood in stool, constipation, diarrhea, nausea and vomiting.       Epigastric pain in the past, Omeprazole helped. Now c/o bloating after meals especially after dinner.,   Genitourinary: Positive for frequency. Negative for dysuria, flank pain, hematuria and urgency.  Musculoskeletal: Positive for gait problem. Negative for back pain, myalgias and neck pain.       Left breast pain travels to the right, gravity worsens pain when she stands up-resolved  Skin: Negative for rash.       The right mastectomy.  A small less than 1cm smooth mobile nose tip consistency nodule palpated mid left fore arm. A >1cm skin lesion with rough surface,  various shade of brown color, and irregular border represent [facility] dermatology consultation.   Neurological: Negative for dizziness, tremors, seizures, weakness and headaches.       History of CVA 2013. Residual problems of dysarthria and right hemiparesis.  Psychiatric/Behavioral: Positive for confusion and decreased concentration. Negative for hallucinations. The patient is not nervous/anxious.     Immunization History  Administered Date(s) Administered  . Influenza-Unspecified 11/20/2013, 09/26/2014   Pertinent  Health Maintenance Due  Topic Date Due  . DEXA SCAN  07/18/1994  . PNA vac Low Risk Adult (1 of 2 - PCV13) 07/18/1994  . INFLUENZA VACCINE  08/07/2015   No flowsheet data found. Functional Status Survey:    Vitals:   09/06/15 1131  BP: 140/70  Pulse: 62  Resp: 18  Temp: 97.8 F (36.6 C)  Weight: 157 lb (71.2 kg)  Height: 5\' 3"  (1.6 m)   Body mass index is 27.81  kg/m. Physical Exam  Constitutional: She appears well-developed.  HENT:  Head: Normocephalic and atraumatic.  Left Ear: External ear normal.  Nose: Mucosal edema and rhinorrhea present. Right sinus exhibits no maxillary sinus tenderness and no frontal sinus tenderness. Left sinus exhibits no maxillary sinus tenderness and no frontal sinus tenderness.  Mouth/Throat: Oropharynx is clear and moist. Mucous membranes are not pale, not dry and not cyanotic. No oropharyngeal exudate.  Eyes: Conjunctivae and EOM are normal. Pupils are equal, round, and reactive to light. Right eye exhibits no discharge. Left eye exhibits no discharge.  Neck: Normal range of motion. Neck supple. No JVD present. No thyromegaly present.  Cardiovascular: Normal rate, regular rhythm and normal heart sounds.  Exam reveals no friction rub.   No murmur heard. Pulmonary/Chest: Effort normal and breath sounds normal. No respiratory distress. She has no wheezes. She has no rales.  Abdominal: Soft. Bowel sounds are normal. She exhibits no distension. There is no tenderness. There is no rebound and no guarding.     Genitourinary: Vagina normal.  Musculoskeletal: She exhibits no edema or tenderness.       Right shoulder: She exhibits decreased strength.  Right sided paralysis with muscle strength 4/5  Lymphadenopathy:    She has no cervical adenopathy.  Neurological: She is alert. She displays abnormal reflex. No cranial nerve deficit. She exhibits abnormal muscle tone. Coordination abnormal.  Dysarthria. Right hemiparesis. Dementia.  Skin: Skin is warm and dry. No rash noted.  S/p right mastectomy. The right mastectomy. A small less than 1cm smooth mobile nose tip consistency nodule palpated mid left fore arm. A >1cm skin lesion with rough surface,  various shade of brown color, and irregular border represent [facility] dermatology consultation.  Pain in the left breast reported, radiates to the left chest wall, worsens when  she stands up-resolved    Psychiatric: Her mood appears not anxious. Her affect is not angry, not blunt, not labile and not inappropriate. Her speech is delayed and tangential. Her speech is not rapid and/or pressured and not slurred. She is slowed. She is not agitated, not aggressive, not hyperactive, not withdrawn, not actively hallucinating and not combative. Thought content is not paranoid and not delusional. Cognition and memory are impaired. She does not express impulsivity or inappropriate judgment. She does not exhibit a depressed mood. She is communicative. She exhibits abnormal recent memory. She exhibits normal remote memory.  Mild anxiety. She is attentive.    Labs reviewed:  Recent Labs  12/25/14 2349 06/12/15 08/02/15  NA 142 140 139  K 3.6 3.8 3.5  CL 109  --   --   CO2 23  --   --   GLUCOSE 101*  --   --   BUN 13 11 11   CREATININE 1.00 0.9 0.7  CALCIUM 9.6  --   --     Recent Labs  12/25/14 2349 06/12/15 08/02/15  AST 22 15 20   ALT 18 12 16   ALKPHOS 111 100 107  BILITOT 0.6  --   --   PROT 6.1*  --   --   ALBUMIN 3.8  --   --     Recent Labs  12/25/14 2349 07/19/15 08/02/15  WBC 5.7 5.3 7.3  NEUTROABS 3.0  --   --   HGB 13.4 8.9* 14.6  HCT 41.9 28* 44  MCV 95.7  --   --   PLT 215 501* 254   Lab Results  Component Value Date   TSH 1.21 08/02/2015   Lab Results  Component Value Date   HGBA1C 5.5 08/02/2015   No results found for: CHOL, HDL, LDLCALC, LDLDIRECT, TRIG, CHOLHDL  Significant Diagnostic Results in last 30 days:  No results found.  Assessment/Plan 1. Essential hypertension, benign Controlled  2. Dysphagia Weight stable. Denies choking.  3. Cerebrovascular accident (CVA) due to thrombosis of precerebral artery (HCC) Residual right hemiparesis and aphasia  4. Depression Stable

## 2015-10-03 ENCOUNTER — Encounter: Payer: Self-pay | Admitting: Nurse Practitioner

## 2015-10-03 ENCOUNTER — Non-Acute Institutional Stay: Payer: Medicare Other | Admitting: Nurse Practitioner

## 2015-10-03 DIAGNOSIS — F329 Major depressive disorder, single episode, unspecified: Secondary | ICD-10-CM

## 2015-10-03 DIAGNOSIS — F32A Depression, unspecified: Secondary | ICD-10-CM

## 2015-10-03 DIAGNOSIS — G4700 Insomnia, unspecified: Secondary | ICD-10-CM | POA: Diagnosis not present

## 2015-10-03 DIAGNOSIS — I10 Essential (primary) hypertension: Secondary | ICD-10-CM

## 2015-10-03 DIAGNOSIS — K219 Gastro-esophageal reflux disease without esophagitis: Secondary | ICD-10-CM

## 2015-10-03 DIAGNOSIS — I82401 Acute embolism and thrombosis of unspecified deep veins of right lower extremity: Secondary | ICD-10-CM | POA: Diagnosis not present

## 2015-10-03 DIAGNOSIS — K5900 Constipation, unspecified: Secondary | ICD-10-CM

## 2015-10-03 DIAGNOSIS — N318 Other neuromuscular dysfunction of bladder: Secondary | ICD-10-CM

## 2015-10-03 NOTE — Assessment & Plan Note (Signed)
Stable, continue MiraLax qod 

## 2015-10-03 NOTE — Assessment & Plan Note (Signed)
Controlled, continue Metoprolol 12.5mg bid. Monitor Bp/P daily 

## 2015-10-03 NOTE — Progress Notes (Signed)
Location:  [place] Room Number: 803 Place of Service:  ALF [phone]) Provider:  Alanny Rivers, Manxie  NP  Jeanmarie Hubert, MD  Patient Care Team: Estill Dooms, MD as PCP - General (Internal Medicine) Fredrick [place] Otho Darner, NP as Nurse Practitioner (Internal Medicine)  Extended Emergency Contact Information Primary Emergency Contact: [facility] Address: [id number] [place], [place] [address] [name] of [place] Phone: [phone] Work Phone: [phone] Mobile Phone: [phone] Relation: Daughter  Code Status:  DNR Goals of care: Advanced Directive information Advanced Directives 10/03/2015  Does patient have an advance directive? Yes  Type of Paramedic of [place];Living will;Out of facility DNR (pink MOST or yellow form)  Does patient want to make changes to advanced directive? No - Patient declined  Copy of advanced directive(s) in chart? Yes     Chief Complaint  Patient presents with  . Medical Management of Chronic Issues    HPI:  Pt is a 82 y.o. female seen today for medical management of chronic diseases.     Hx of GERD, stable while on Omeprazole 20mg , blood pressure is controlled on Metoprolol 12.5mg  bid, sleep and eats well, mood is stable, taking Zoloft 75mg , Mirtazapine 7.5mg . Hx of DVT, protein C deficiency, long term anticoagulation with Xarelto.  Past Medical History:  Diagnosis Date  . Depression   . Hypertension   . Stroke [facility])    Past Surgical History:  Procedure Laterality Date  . ABDOMINAL HYSTERECTOMY    . APPENDECTOMY    . BREAST SURGERY    . MASTECTOMY      Allergies  Allergen Reactions  . Hydrocodone Nausea And Vomiting  . Morphine And Related Nausea And Vomiting  . Actonel [Risedronate Sodium] Other (See Comments)    unknown  . Darvon [Propoxyphene Hcl] Other (See Comments)    unknown  . Oxycodone Hcl Other (See Comments)    unknown  . Pneumovax [Pneumococcal  Polysaccharide Vaccine] Other (See Comments)    unknown      Medication List       Accurate as of 10/03/15  3:01 PM. Always use your most recent med list.          acetaminophen 325 MG tablet Commonly known as:  TYLENOL Take 650 mg by mouth every 4 (four) hours as needed for pain.   atorvastatin 10 MG tablet Commonly known as:  LIPITOR Take 10 mg by mouth daily.   CALTRATE 600+D 600-400 MG-UNIT tablet Generic drug:  Calcium Carbonate-Vitamin D Take 1 tablet by mouth 2 (two) times daily.   fluticasone 50 MCG/ACT nasal spray Commonly known as:  FLONASE Place 1 spray into both nostrils as needed for allergies.   GAS RELIEF 80 PO Take by mouth. Take one tablet after meals and at bedtime for gas relief   hydrocortisone 2.5 % cream Apply 1 application topically 2 (two) times daily. For itchy rash   I-VITE Tabs Take 1 tablet by mouth daily.   metoprolol tartrate 12.5 mg Tabs tablet Commonly known as:  LOPRESSOR Take 12.5 mg by mouth 2 (two) times daily. For HTN   mirtazapine 7.5 MG tablet Commonly known as:  REMERON Take 7.5 mg by mouth at bedtime. For depressive disorder   nystatin powder Generic drug:  nystatin Apply topically as needed. Under left breast   omeprazole 20 MG capsule Commonly known as:  PRILOSEC Take 20 mg by mouth daily.   sertraline 25  MG tablet Commonly known as:  ZOLOFT Take 75 mg by mouth daily.   UNABLE TO FIND Take 5 mLs by mouth 4 (four) times daily -  before meals and at bedtime. Med Name: Magic Mouthwash as needed   XARELTO 20 MG Tabs tablet Generic drug:  rivaroxaban Take 20 mg by mouth daily. Reported on 04/26/2015       Review of Systems  Constitutional: Negative for chills, diaphoresis and fever.  HENT: Positive for hearing loss. Negative for congestion, ear discharge and sore throat.   Eyes: Negative for pain, discharge and redness.  Respiratory: Negative for cough and wheezing.   Cardiovascular: Negative for chest pain,  palpitations and leg swelling.  Gastrointestinal: Negative for abdominal pain, blood in stool, constipation, diarrhea, nausea and vomiting.       Epigastric pain in the past, Omeprazole helped. Now c/o bloating after meals especially after dinner.,   Genitourinary: Positive for frequency. Negative for dysuria, flank pain, hematuria and urgency.  Musculoskeletal: Positive for gait problem. Negative for back pain, myalgias and neck pain.       Left breast pain travels to the right, gravity worsens pain when she stands up-resolved  Skin: Negative for rash.       The right mastectomy.  A small less than 1cm smooth mobile nose tip consistency nodule palpated mid left fore arm. A >1cm skin lesion with rough surface,  various shade of brown color, and irregular border represent [facility] dermatology consultation.   Neurological: Negative for dizziness, tremors, seizures, weakness and headaches.       History of CVA 2013. Residual problems of dysarthria and right hemiparesis.  Psychiatric/Behavioral: Positive for confusion and decreased concentration. Negative for hallucinations. The patient is not nervous/anxious.     Immunization History  Administered Date(s) Administered  . Influenza-Unspecified 11/20/2013, 09/26/2014   Pertinent  Health Maintenance Due  Topic Date Due  . DEXA SCAN  07/18/1994  . PNA vac Low Risk Adult (1 of 2 - PCV13) 07/18/1994  . INFLUENZA VACCINE  08/07/2015   No flowsheet data found. Functional Status Survey:    Vitals:   10/03/15 1202  BP: 116/66  Pulse: 84  Resp: 18  Temp: 98.6 F (37 C)  Weight: 157 lb (71.2 kg)  Height: 5\' 3"  (1.6 m)   Body mass index is 27.81 kg/m. Physical Exam  Constitutional: She appears well-developed.  HENT:  Head: Normocephalic and atraumatic.  Left Ear: External ear normal.  Nose: Mucosal edema and rhinorrhea present. Right sinus exhibits no maxillary sinus tenderness and no frontal sinus tenderness. Left sinus exhibits no  maxillary sinus tenderness and no frontal sinus tenderness.  Mouth/Throat: Oropharynx is clear and moist. Mucous membranes are not pale, not dry and not cyanotic. No oropharyngeal exudate.  Eyes: Conjunctivae and EOM are normal. Pupils are equal, round, and reactive to light. Right eye exhibits no discharge. Left eye exhibits no discharge.  Neck: Normal range of motion. Neck supple. No JVD present. No thyromegaly present.  Cardiovascular: Normal rate, regular rhythm and normal heart sounds.  Exam reveals no friction rub.   No murmur heard. Pulmonary/Chest: Effort normal and breath sounds normal. No respiratory distress. She has no wheezes. She has no rales.  Abdominal: Soft. Bowel sounds are normal. She exhibits no distension. There is no tenderness. There is no rebound and no guarding.     Genitourinary: Vagina normal.  Musculoskeletal: She exhibits no edema or tenderness.       Right shoulder: She exhibits decreased strength.  Right sided paralysis with muscle strength 4/5  Lymphadenopathy:    She has no cervical adenopathy.  Neurological: She is alert. She displays abnormal reflex. No cranial nerve deficit. She exhibits abnormal muscle tone. Coordination abnormal.  Dysarthria. Right hemiparesis. Dementia.  Skin: Skin is warm and dry. No rash noted.  S/p right mastectomy. The right mastectomy. A small less than 1cm smooth mobile nose tip consistency nodule palpated mid left fore arm. A >1cm skin lesion with rough surface,  various shade of brown color, and irregular border represent [facility] dermatology consultation.  Pain in the left breast reported, radiates to the left chest wall, worsens when she stands up-resolved    Psychiatric: Her mood appears not anxious. Her affect is not angry, not blunt, not labile and not inappropriate. Her speech is delayed and tangential. Her speech is not rapid and/or pressured and not slurred. She is slowed. She is not agitated, not aggressive, not  hyperactive, not withdrawn, not actively hallucinating and not combative. Thought content is not paranoid and not delusional. Cognition and memory are impaired. She does not express impulsivity or inappropriate judgment. She does not exhibit a depressed mood. She is communicative. She exhibits abnormal recent memory. She exhibits normal remote memory.  Mild anxiety. She is attentive.    Labs reviewed:  Recent Labs  12/25/14 2349 06/12/15 08/02/15  NA 142 140 139  K 3.6 3.8 3.5  CL 109  --   --   CO2 23  --   --   GLUCOSE 101*  --   --   BUN 13 11 11   CREATININE 1.00 0.9 0.7  CALCIUM 9.6  --   --     Recent Labs  12/25/14 2349 06/12/15 08/02/15  AST 22 15 20   ALT 18 12 16   ALKPHOS 111 100 107  BILITOT 0.6  --   --   PROT 6.1*  --   --   ALBUMIN 3.8  --   --     Recent Labs  12/25/14 2349 07/19/15 08/02/15  WBC 5.7 5.3 7.3  NEUTROABS 3.0  --   --   HGB 13.4 8.9* 14.6  HCT 41.9 28* 44  MCV 95.7  --   --   PLT 215 501* 254   Lab Results  Component Value Date   TSH 1.21 08/02/2015   Lab Results  Component Value Date   HGBA1C 5.5 08/02/2015   No results found for: CHOL, HDL, LDLCALC, LDLDIRECT, TRIG, CHOLHDL  Significant Diagnostic Results in last 30 days:  No results found.  Assessment/Plan There are no diagnoses linked to this encounter.Essential hypertension, benign Controlled, continue Metoprolol 12.5mg  bid. Monitor Bp/P daily.  DVT (deep venous thrombosis) Onset 02/01/2012, left extremity DVT, coumadin treatment should be for 3 months. Off Coumadin. Started Xarelto 20mg  for Protein C/S deficiency.   GERD (gastroesophageal reflux disease) Stable, continue Omeprazole 20mg  daily  Constipation Stable, continue MiraLax qod    Hypertonicity of bladder  Brief for urinary leakage.   Depression Mood is stable, continue Sertraline 75mg , Mirtazapine 7.5mg (sleeps well at night and weight has been stabilized    Insomnia Sleeps well at night.       Family/ staff Communication: continue AL for care assistance.   Labs/tests ordered:  none

## 2015-10-03 NOTE — Assessment & Plan Note (Signed)
Stable, continue Omeprazole 20mg daily.  

## 2015-10-03 NOTE — Assessment & Plan Note (Signed)
Onset 02/01/2012, left extremity DVT, coumadin treatment should be for 3 months. Off Coumadin. Started Xarelto 20mg for Protein C/S deficiency. 

## 2015-10-03 NOTE — Assessment & Plan Note (Signed)
Mood is stable, continue Sertraline 75mg, Mirtazapine 7.5mg(sleeps well at night and weight has been stabilized 

## 2015-10-03 NOTE — Assessment & Plan Note (Signed)
Sleeps well at night.  

## 2015-10-03 NOTE — Assessment & Plan Note (Addendum)
  Brief for urinary leakage.

## 2015-10-30 LAB — LIPID PANEL
CHOLESTEROL: 186 mg/dL (ref 0–200)
HDL: 51 mg/dL (ref 35–70)
LDL Cholesterol: 106 mg/dL
LDL/HDL RATIO: 3.6
TRIGLYCERIDES: 145 mg/dL (ref 40–160)

## 2015-10-31 ENCOUNTER — Encounter: Payer: Self-pay | Admitting: Nurse Practitioner

## 2015-10-31 ENCOUNTER — Non-Acute Institutional Stay: Payer: Medicare Other | Admitting: Nurse Practitioner

## 2015-10-31 DIAGNOSIS — K5900 Constipation, unspecified: Secondary | ICD-10-CM

## 2015-10-31 DIAGNOSIS — K219 Gastro-esophageal reflux disease without esophagitis: Secondary | ICD-10-CM

## 2015-10-31 DIAGNOSIS — I82402 Acute embolism and thrombosis of unspecified deep veins of left lower extremity: Secondary | ICD-10-CM | POA: Diagnosis not present

## 2015-10-31 DIAGNOSIS — R21 Rash and other nonspecific skin eruption: Secondary | ICD-10-CM | POA: Diagnosis not present

## 2015-10-31 DIAGNOSIS — I10 Essential (primary) hypertension: Secondary | ICD-10-CM

## 2015-10-31 DIAGNOSIS — F339 Major depressive disorder, recurrent, unspecified: Secondary | ICD-10-CM

## 2015-10-31 DIAGNOSIS — I6300 Cerebral infarction due to thrombosis of unspecified precerebral artery: Secondary | ICD-10-CM

## 2015-10-31 DIAGNOSIS — N318 Other neuromuscular dysfunction of bladder: Secondary | ICD-10-CM | POA: Diagnosis not present

## 2015-10-31 DIAGNOSIS — E785 Hyperlipidemia, unspecified: Secondary | ICD-10-CM | POA: Diagnosis not present

## 2015-10-31 DIAGNOSIS — S81811A Laceration without foreign body, right lower leg, initial encounter: Secondary | ICD-10-CM | POA: Insufficient documentation

## 2015-10-31 DIAGNOSIS — L989 Disorder of the skin and subcutaneous tissue, unspecified: Secondary | ICD-10-CM

## 2015-10-31 NOTE — Assessment & Plan Note (Addendum)
Lateral the right upper leg scratched injuries. Apply Mycolog II bid to affected area until healed

## 2015-10-31 NOTE — Assessment & Plan Note (Signed)
Brief for urinary leakage.

## 2015-10-31 NOTE — Assessment & Plan Note (Signed)
Stable, continue MiraLax qod 

## 2015-10-31 NOTE — Assessment & Plan Note (Signed)
Onset 02/01/2012, left extremity DVT, coumadin treatment should be for 3 months. Off Coumadin. Started Xarelto 20mg for Protein C/S deficiency. 

## 2015-10-31 NOTE — Assessment & Plan Note (Signed)
Mood is stable, continue Sertraline 75mg, Mirtazapine 7.5mg(sleeps well at night and weight has been stabilized 

## 2015-10-31 NOTE — Assessment & Plan Note (Signed)
10/30/15 cholesterol 186, triglycerides 145, HDL 51, LDL 106

## 2015-10-31 NOTE — Progress Notes (Signed)
Location:  [place] Room Number: 803 Place of Service:  ALF [phone]) Provider:  Mast, Manxie  NP  Jeanmarie Hubert, MD  Patient Care Team: Estill Dooms, MD as PCP - General (Internal Medicine) Man Otho Darner, NP as Nurse Practitioner (Internal Medicine)  Extended Emergency Contact Information Primary Emergency Contact: [facility] Address: [id number] [place], [place] [address] [name] of [place] Phone: [phone] Work Phone: [phone] Mobile Phone: [phone] Relation: Daughter  Code Status:  DNR Goals of care: Advanced Directive information Advanced Directives 10/31/2015  Does patient have an advance directive? Yes  Type of Paramedic of [place];Living will;Out of facility DNR (pink MOST or yellow form)  Does patient want to make changes to advanced directive? No - Patient declined  Copy of advanced directive(s) in chart? Yes     Chief Complaint  Patient presents with  . Acute Visit    Blotches/spots right upper legs, daughter requested    HPI:  Pt is a 82 y.o. female seen today for an acute visit for rash in the lateral right upper leg, itching, but no s/s of infection, likely scratched injuries.     Hx of GERD, stable while on Omeprazole 20mg , blood pressure is controlled on Metoprolol 12.5mg  bid, sleep and eats well, mood is stable, taking Zoloft 75mg , Mirtazapine 7.5mg . Hx of DVT, protein C deficiency, long term anticoagulation with Xarelto.    Past Medical History:  Diagnosis Date  . Depression   . Hypertension   . Stroke [facility])    Past Surgical History:  Procedure Laterality Date  . ABDOMINAL HYSTERECTOMY    . APPENDECTOMY    . BREAST SURGERY    . MASTECTOMY      Allergies  Allergen Reactions  . Hydrocodone Nausea And Vomiting  . Morphine And Related Nausea And Vomiting  . Actonel [Risedronate Sodium] Other (See Comments)    unknown  . Darvon [Propoxyphene Hcl]  Other (See Comments)    unknown  . Oxycodone Hcl Other (See Comments)    unknown  . Pneumovax [Pneumococcal Polysaccharide Vaccine] Other (See Comments)    unknown      Medication List       Accurate as of 10/31/15  4:25 PM. Always use your most recent med list.          acetaminophen 325 MG tablet Commonly known as:  TYLENOL Take 650 mg by mouth every 4 (four) hours as needed for pain.   atorvastatin 10 MG tablet Commonly known as:  LIPITOR Take 10 mg by mouth daily.   CALTRATE 600+D 600-400 MG-UNIT tablet Generic drug:  Calcium Carbonate-Vitamin D Take 1 tablet by mouth 2 (two) times daily.   fluticasone 50 MCG/ACT nasal spray Commonly known as:  FLONASE Place 1 spray into both nostrils as needed for allergies.   GAS RELIEF 80 PO Take by mouth. Take one tablet after meals and at bedtime for gas relief   hydrocortisone 2.5 % cream Apply 1 application topically 2 (two) times daily. For itchy rash   I-VITE Tabs Take 1 tablet by mouth daily.   metoprolol tartrate 12.5 mg Tabs tablet Commonly known as:  LOPRESSOR Take 12.5 mg by mouth 2 (two) times daily. For HTN   mirtazapine 7.5 MG tablet Commonly known as:  REMERON Take 7.5 mg by mouth at bedtime. For depressive disorder   nystatin powder Generic drug:  nystatin Apply topically as needed. Under  left breast   omeprazole 20 MG capsule Commonly known as:  PRILOSEC Take 20 mg by mouth daily.   polyethylene glycol packet Commonly known as:  MIRALAX / GLYCOLAX Take 17 g by mouth daily.   sertraline 25 MG tablet Commonly known as:  ZOLOFT Take 75 mg by mouth daily.   UNABLE TO FIND Take 5 mLs by mouth 4 (four) times daily -  before meals and at bedtime. Med Name: Magic Mouthwash as needed   XARELTO 20 MG Tabs tablet Generic drug:  rivaroxaban Take 20 mg by mouth daily. Reported on 04/26/2015       Review of Systems  Constitutional: Negative for chills, diaphoresis and fever.  HENT: Positive for  hearing loss. Negative for congestion, ear discharge and sore throat.   Eyes: Negative for pain, discharge and redness.  Respiratory: Negative for cough and wheezing.   Cardiovascular: Negative for chest pain, palpitations and leg swelling.  Gastrointestinal: Negative for abdominal pain, blood in stool, constipation, diarrhea, nausea and vomiting.       Epigastric pain in the past, Omeprazole helped. Now c/o bloating after meals especially after dinner.,   Genitourinary: Positive for frequency. Negative for dysuria, flank pain, hematuria and urgency.  Musculoskeletal: Positive for gait problem. Negative for back pain, myalgias and neck pain.       Left breast pain travels to the right, gravity worsens pain when she stands up-resolved  Skin: Positive for rash.       The right mastectomy.  A small less than 1cm smooth mobile nose tip consistency nodule palpated mid left fore arm. A >1cm skin lesion with rough surface,  various shade of brown color, and irregular border represent [facility] dermatology consultation. Lateral the right upper leg scratched injuries.   Neurological: Negative for dizziness, tremors, seizures, weakness and headaches.       History of CVA 2013. Residual problems of dysarthria and right hemiparesis.  Psychiatric/Behavioral: Positive for confusion and decreased concentration. Negative for hallucinations. The patient is not nervous/anxious.     Immunization History  Administered Date(s) Administered  . Influenza-Unspecified 11/20/2013, 09/26/2014, 10/24/2015   Pertinent  Health Maintenance Due  Topic Date Due  . DEXA SCAN  07/18/1994  . PNA vac Low Risk Adult (1 of 2 - PCV13) 07/18/1994  . INFLUENZA VACCINE  Completed   No flowsheet data found. Functional Status Survey:    Vitals:   10/31/15 1307  BP: 120/68  Pulse: 76  Resp: 16  Temp: 97.6 F (36.4 C)  Weight: 154 lb (69.9 kg)  Height: 5\' 3"  (1.6 m)   Body mass index is 27.28 kg/m. Physical Exam    Constitutional: She appears well-developed.  HENT:  Head: Normocephalic and atraumatic.  Left Ear: External ear normal.  Nose: Mucosal edema and rhinorrhea present. Right sinus exhibits no maxillary sinus tenderness and no frontal sinus tenderness. Left sinus exhibits no maxillary sinus tenderness and no frontal sinus tenderness.  Mouth/Throat: Oropharynx is clear and moist. Mucous membranes are not pale, not dry and not cyanotic. No oropharyngeal exudate.  Eyes: Conjunctivae and EOM are normal. Pupils are equal, round, and reactive to light. Right eye exhibits no discharge. Left eye exhibits no discharge.  Neck: Normal range of motion. Neck supple. No JVD present. No thyromegaly present.  Cardiovascular: Normal rate, regular rhythm and normal heart sounds.  Exam reveals no friction rub.   No murmur heard. Pulmonary/Chest: Effort normal and breath sounds normal. No respiratory distress. She has no wheezes. She has no rales.  Abdominal: Soft. Bowel sounds are normal. She exhibits no distension. There is no tenderness. There is no rebound and no guarding.     Genitourinary: Vagina normal.  Musculoskeletal: She exhibits no edema or tenderness.       Right shoulder: She exhibits decreased strength.  Right sided paralysis with muscle strength 4/5  Lymphadenopathy:    She has no cervical adenopathy.  Neurological: She is alert. She displays abnormal reflex. No cranial nerve deficit. She exhibits abnormal muscle tone. Coordination abnormal.  Dysarthria. Right hemiparesis. Dementia.  Skin: Skin is warm and dry. No rash noted.  S/p right mastectomy. The right mastectomy. A small less than 1cm smooth mobile nose tip consistency nodule palpated mid left fore arm. A >1cm skin lesion with rough surface,  various shade of brown color, and irregular border represent [facility] dermatology consultation.  Splotchy spots lateral right upper leg.  Lateral the right upper leg scratched injuries.       Psychiatric: Her mood appears not anxious. Her affect is not angry, not blunt, not labile and not inappropriate. Her speech is delayed and tangential. Her speech is not rapid and/or pressured and not slurred. She is slowed. She is not agitated, not aggressive, not hyperactive, not withdrawn, not actively hallucinating and not combative. Thought content is not paranoid and not delusional. Cognition and memory are impaired. She does not express impulsivity or inappropriate judgment. She does not exhibit a depressed mood. She is communicative. She exhibits abnormal recent memory. She exhibits normal remote memory.  Mild anxiety. She is attentive.    Labs reviewed:  Recent Labs  12/25/14 2349 06/12/15 08/02/15  NA 142 140 139  K 3.6 3.8 3.5  CL 109  --   --   CO2 23  --   --   GLUCOSE 101*  --   --   BUN 13 11 11   CREATININE 1.00 0.9 0.7  CALCIUM 9.6  --   --     Recent Labs  12/25/14 2349 06/12/15 08/02/15  AST 22 15 20   ALT 18 12 16   ALKPHOS 111 100 107  BILITOT 0.6  --   --   PROT 6.1*  --   --   ALBUMIN 3.8  --   --     Recent Labs  12/25/14 2349 07/19/15 08/02/15  WBC 5.7 5.3 7.3  NEUTROABS 3.0  --   --   HGB 13.4 8.9* 14.6  HCT 41.9 28* 44  MCV 95.7  --   --   PLT 215 501* 254   Lab Results  Component Value Date   TSH 1.21 08/02/2015   Lab Results  Component Value Date   HGBA1C 5.5 08/02/2015   Lab Results  Component Value Date   CHOL 186 10/30/2015   HDL 51 10/30/2015   LDLCALC 106 10/30/2015   TRIG 145 10/30/2015    Significant Diagnostic Results in last 30 days:  No results found.  Assessment/Plan Dyslipidemia 10/30/15 cholesterol 186, triglycerides 145, HDL 51, LDL 106  Rash and nonspecific skin eruption Lateral the right upper leg scratched injuries. Apply Mycolog II bid to affected area until healed  Essential hypertension, benign Controlled, continue Metoprolol 12.5mg  bid. Monitor Bp/P daily.   DVT (deep venous thrombosis) Onset  02/01/2012, left extremity DVT, coumadin treatment should be for 3 months. Off Coumadin. Started Xarelto 20mg  for Protein C/S deficiency.  CVA (cerebral vascular accident) Left parietal and occipital hemorrhagic stroke 11/2011  GERD (gastroesophageal reflux disease) Stable, continue Omeprazole 20mg  daily  Constipation  Stable, continue MiraLax qod  Hypertonicity of bladder Brief for urinary leakage.   Depression Mood is stable, continue Sertraline 75mg , Mirtazapine 7.5mg (sleeps well at night and weight has been stabilized     Family/ staff Communication:  Continue AL for care assistance  Labs/tests ordered:  none

## 2015-10-31 NOTE — Assessment & Plan Note (Signed)
Controlled, continue Metoprolol 12.5mg bid. Monitor Bp/P daily 

## 2015-10-31 NOTE — Assessment & Plan Note (Signed)
Stable, continue Omeprazole 20mg daily.  

## 2015-10-31 NOTE — Assessment & Plan Note (Signed)
Left parietal and occipital hemorrhagic stroke 11/2011

## 2015-12-25 LAB — LIPID PANEL
CHOLESTEROL: 193 mg/dL (ref 0–200)
HDL: 45 mg/dL (ref 35–70)
LDL Cholesterol: 112 mg/dL
LDl/HDL Ratio: 4.3
TRIGLYCERIDES: 182 mg/dL — AB (ref 40–160)

## 2015-12-25 LAB — TSH: TSH: 1.57 u[IU]/mL (ref <=5.90)

## 2015-12-26 ENCOUNTER — Other Ambulatory Visit: Payer: Self-pay | Admitting: *Deleted

## 2015-12-27 ENCOUNTER — Non-Acute Institutional Stay: Payer: Medicare Other | Admitting: Internal Medicine

## 2015-12-27 ENCOUNTER — Encounter: Payer: Self-pay | Admitting: Internal Medicine

## 2015-12-27 DIAGNOSIS — K219 Gastro-esophageal reflux disease without esophagitis: Secondary | ICD-10-CM

## 2015-12-27 DIAGNOSIS — I10 Essential (primary) hypertension: Secondary | ICD-10-CM

## 2015-12-27 DIAGNOSIS — I6300 Cerebral infarction due to thrombosis of unspecified precerebral artery: Secondary | ICD-10-CM | POA: Diagnosis not present

## 2015-12-27 DIAGNOSIS — F339 Major depressive disorder, recurrent, unspecified: Secondary | ICD-10-CM

## 2015-12-27 NOTE — Progress Notes (Signed)
Progress Note    Location:  [place] Room Number: [place] of Service:  ALF [phone]) Provider:  Jeanmarie Hubert, MD  Patient Care Team: Estill Dooms, MD as PCP - General (Internal Medicine) Man Otho Darner, NP as Nurse Practitioner (Internal Medicine)  Extended Emergency Contact Information Primary Emergency Contact: [facility] Address: [id number] [place], [place] [address] [name] of [place] Phone: [phone] Work Phone: [phone] Mobile Phone: [phone] Relation: Daughter  Code Status:  DNR Goals of care: Advanced Directive information Advanced Directives 12/27/2015  Does Patient Have a Medical Advance Directive? Yes  Type of Paramedic of [place];Living will;Out of facility DNR (pink MOST or yellow form)  Does patient want to make changes to medical advance directive? -  Copy of [place] in Chart? Yes  Pre-existing out of facility DNR order (yellow form or pink MOST form) Yellow form placed in chart (order not valid for inpatient use)     Chief Complaint  Patient presents with  . Medical Management of Chronic Issues    Routine visit    HPI:  Pt is a 82 y.o. female seen today for medical management of chronic diseases.    Essential hypertension, benign - controlled  Gastroesophageal reflux disease without esophagitis - denies discomfort or difficulty swallowing  Cerebrovascular accident (CVA) due to thrombosis of precerebral artery ([place]) - history of CVA with residual aphasia and right sided weakness. No recent additional problems.  Recurrent major depressive disorder, remission status unspecified ([place]) - controlled on current medications     Past Medical History:  Diagnosis Date  . Depression   . Hypertension   . Stroke [facility])    Past Surgical History:  Procedure Laterality Date  . ABDOMINAL HYSTERECTOMY    . APPENDECTOMY    . BREAST SURGERY     . MASTECTOMY      Allergies  Allergen Reactions  . Hydrocodone Nausea And Vomiting  . Morphine And Related Nausea And Vomiting  . Actonel [Risedronate Sodium] Other (See Comments)    unknown  . Darvon [Propoxyphene Hcl] Other (See Comments)    unknown  . Oxycodone Hcl Other (See Comments)    unknown  . Pneumovax [Pneumococcal Polysaccharide Vaccine] Other (See Comments)    unknown    Allergies as of 12/27/2015      Reactions   Hydrocodone Nausea And Vomiting   Morphine And Related Nausea And Vomiting   Actonel [risedronate Sodium] Other (See Comments)   unknown   Darvon [propoxyphene Hcl] Other (See Comments)   unknown   Oxycodone Hcl Other (See Comments)   unknown   Pneumovax [pneumococcal Polysaccharide Vaccine] Other (See Comments)   unknown      Medication List       Accurate as of 12/27/15  1:13 PM. Always use your most recent med list.          acetaminophen 325 MG tablet Commonly known as:  TYLENOL Take 650 mg by mouth every 4 (four) hours as needed for pain.   atorvastatin 10 MG tablet Commonly known as:  LIPITOR Take 10 mg by mouth daily.   CALTRATE 600+D 600-400 MG-UNIT tablet Generic drug:  Calcium Carbonate-Vitamin D Take 1 tablet by mouth 2 (two) times daily.   fluticasone 50 MCG/ACT nasal spray Commonly known as:  FLONASE Place 1 spray into both nostrils as needed for allergies.   GAS RELIEF 80 PO  Take by mouth. Take one tablet after meals and at bedtime for gas relief   hydrocortisone 2.5 % cream Apply 1 application topically 2 (two) times daily. For itchy rash   hydrocortisone cream 1 % Apply 1 application topically. Preparation H apply to hemorrhoids three times a day as needed for hemorrhoids   I-VITE Tabs Take 1 tablet by mouth daily.   metoprolol tartrate 12.5 mg Tabs tablet Commonly known as:  LOPRESSOR Take 12.5 mg by mouth 2 (two) times daily. For HTN   mirtazapine 7.5 MG tablet Commonly known as:  REMERON Take 7.5 mg  by mouth at bedtime. For depressive disorder   nystatin powder Generic drug:  nystatin Apply topically as needed. Under left breast   omeprazole 20 MG capsule Commonly known as:  PRILOSEC Take 20 mg by mouth daily.   polyethylene glycol packet Commonly known as:  MIRALAX / GLYCOLAX Take 17 g by mouth daily.   sertraline 25 MG tablet Commonly known as:  ZOLOFT Take 75 mg by mouth daily.   UNABLE TO FIND Take 5 mLs by mouth 4 (four) times daily -  before meals and at bedtime. Med Name: Magic Mouthwash as needed   XARELTO 20 MG Tabs tablet Generic drug:  rivaroxaban Take 20 mg by mouth daily. Reported on 04/26/2015       Review of Systems  Constitutional: Negative for chills, diaphoresis and fever.  HENT: Positive for hearing loss. Negative for congestion, ear discharge and sore throat.   Eyes: Negative for pain, discharge and redness.  Respiratory: Negative for cough and wheezing.   Cardiovascular: Negative for chest pain, palpitations and leg swelling.  Gastrointestinal: Negative for abdominal pain, blood in stool, constipation, diarrhea, nausea and vomiting.       Epigastric pain in the past, Omeprazole helped. Now c/o bloating after meals especially after dinner.,   Genitourinary: Positive for frequency. Negative for dysuria, flank pain, hematuria and urgency.  Musculoskeletal: Positive for gait problem. Negative for back pain, myalgias and neck pain.       Left breast pain travels to the right, gravity worsens pain when she stands up-resolved  Skin: Positive for rash.       The right mastectomy.  A small less than 1cm smooth mobile nose tip consistency nodule palpated mid left fore arm. A >1cm skin lesion with rough surface,  various shade of brown color, and irregular border represent [facility] dermatology consultation. Lateral the right upper leg scratched injuries.   Neurological: Negative for dizziness, tremors, seizures, weakness and headaches.       History of CVA  2013. Residual problems of dysarthria and right hemiparesis.  Psychiatric/Behavioral: Positive for confusion and decreased concentration. Negative for hallucinations. The patient is not nervous/anxious.     Immunization History  Administered Date(s) Administered  . Influenza-Unspecified 11/20/2013, 09/26/2014, 10/24/2015   Pertinent  Health Maintenance Due  Topic Date Due  . DEXA SCAN  07/18/1994  . PNA vac Low Risk Adult (1 of 2 - PCV13) 07/18/1994  . INFLUENZA VACCINE  Completed   No flowsheet data found. Functional Status Survey:    Vitals:   12/27/15 1306  BP: 138/72  Pulse: 68  Resp: 16  Temp: 98.2 F (36.8 C)  Weight: 154 lb (69.9 kg)  Height: 5\' 3"  (1.6 m)   Body mass index is 27.28 kg/m. Physical Exam  Constitutional: She appears well-developed.  HENT:  Head: Normocephalic and atraumatic.  Left Ear: External ear normal.  Nose: Mucosal edema and rhinorrhea present. Right  sinus exhibits no maxillary sinus tenderness and no frontal sinus tenderness. Left sinus exhibits no maxillary sinus tenderness and no frontal sinus tenderness.  Mouth/Throat: Oropharynx is clear and moist. Mucous membranes are not pale, not dry and not cyanotic. No oropharyngeal exudate.  Eyes: Conjunctivae and EOM are normal. Pupils are equal, round, and reactive to light. Right eye exhibits no discharge. Left eye exhibits no discharge.  Neck: Normal range of motion. Neck supple. No JVD present. No thyromegaly present.  Cardiovascular: Normal rate, regular rhythm and normal heart sounds.  Exam reveals no friction rub.   No murmur heard. Pulmonary/Chest: Effort normal and breath sounds normal. No respiratory distress. She has no wheezes. She has no rales.  Abdominal: Soft. Bowel sounds are normal. She exhibits no distension. There is no tenderness. There is no rebound and no guarding.     Genitourinary: Vagina normal.  Musculoskeletal: She exhibits no edema or tenderness.       Right shoulder:  She exhibits decreased strength.  Right sided paralysis with muscle strength 4/5  Lymphadenopathy:    She has no cervical adenopathy.  Neurological: She is alert. She displays abnormal reflex. No cranial nerve deficit. She exhibits abnormal muscle tone. Coordination abnormal.  Dysarthria. Right hemiparesis. Dementia.  Skin: Skin is warm and dry. No rash noted.  S/p right mastectomy. The right mastectomy. A small less than 1cm smooth mobile nose tip consistency nodule palpated mid left fore arm. A >1cm skin lesion with rough surface,  various shade of brown color, and irregular border represent [facility] dermatology consultation.  Splotchy spots lateral right upper leg.  Lateral the right upper leg scratched injuries.      Psychiatric: Her mood appears not anxious. Her affect is not angry, not blunt, not labile and not inappropriate. Her speech is delayed and tangential. Her speech is not rapid and/or pressured and not slurred. She is slowed. She is not agitated, not aggressive, not hyperactive, not withdrawn, not actively hallucinating and not combative. Thought content is not paranoid and not delusional. Cognition and memory are impaired. She does not express impulsivity or inappropriate judgment. She does not exhibit a depressed mood. She is communicative. She exhibits abnormal recent memory. She exhibits normal remote memory.  Mild anxiety. She is attentive.    Labs reviewed:  Recent Labs  06/12/15 08/02/15  NA 140 139  K 3.8 3.5  BUN 11 11  CREATININE 0.9 0.7    Recent Labs  06/12/15 08/02/15  AST 15 20  ALT 12 16  ALKPHOS 100 107    Recent Labs  07/19/15 08/02/15  WBC 5.3 7.3  HGB 8.9* 14.6  HCT 28* 44  PLT 501* 254   Lab Results  Component Value Date   TSH 1.57 12/25/2015   Lab Results  Component Value Date   HGBA1C 5.5 08/02/2015   Lab Results  Component Value Date   CHOL 193 12/25/2015   HDL 45 12/25/2015   LDLCALC 112 12/25/2015   TRIG 182 (A) 12/25/2015     Assessment/Plan  1. Essential hypertension, benign The current medical regimen is effective;  continue present plan and medications.  2. Gastroesophageal reflux disease without esophagitis The current medical regimen is effective;  continue present plan and medications.  3. Cerebrovascular accident (CVA) due to thrombosis of precerebral artery ([place]) staable  4. Recurrent major depressive disorder, remission status unspecified ([place]) The current medical regimen is effective;  continue present plan and medications.

## 2016-03-05 ENCOUNTER — Encounter: Payer: Self-pay | Admitting: Nurse Practitioner

## 2016-03-05 ENCOUNTER — Non-Acute Institutional Stay: Payer: Medicare Other | Admitting: Nurse Practitioner

## 2016-03-05 DIAGNOSIS — I6300 Cerebral infarction due to thrombosis of unspecified precerebral artery: Secondary | ICD-10-CM

## 2016-03-05 DIAGNOSIS — I10 Essential (primary) hypertension: Secondary | ICD-10-CM | POA: Diagnosis not present

## 2016-03-05 DIAGNOSIS — R140 Abdominal distension (gaseous): Secondary | ICD-10-CM

## 2016-03-05 DIAGNOSIS — D6859 Other primary thrombophilia: Secondary | ICD-10-CM

## 2016-03-05 DIAGNOSIS — K219 Gastro-esophageal reflux disease without esophagitis: Secondary | ICD-10-CM

## 2016-03-05 DIAGNOSIS — F339 Major depressive disorder, recurrent, unspecified: Secondary | ICD-10-CM | POA: Diagnosis not present

## 2016-03-05 DIAGNOSIS — K5900 Constipation, unspecified: Secondary | ICD-10-CM | POA: Diagnosis not present

## 2016-03-05 NOTE — Assessment & Plan Note (Signed)
Stable, continue MiraLax qd.  

## 2016-03-05 NOTE — Assessment & Plan Note (Signed)
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 Continue Xarelto 

## 2016-03-05 NOTE — Assessment & Plan Note (Signed)
Controlled, continue Metoprolol 12.5mg bid. Monitor Bp/P daily 

## 2016-03-05 NOTE — Assessment & Plan Note (Signed)
Continue Gas X

## 2016-03-05 NOTE — Assessment & Plan Note (Signed)
Mood is stable, continue Sertraline 75mg, Mirtazapine 7.5mg(sleeps well at night and weight has been stabilized 

## 2016-03-05 NOTE — Assessment & Plan Note (Signed)
Protein C and S deficiency, continue Xarelto

## 2016-03-05 NOTE — Progress Notes (Signed)
Location:  [place] Room Number: 803 Place of Service:  ALF [id number]) Provider:  Norm Wray, [place]  NP  Jeanmarie Hubert, MD  Patient Care Team: Estill Dooms, MD as PCP - General (Internal Medicine) Audreanna Torrisi Otho Darner, NP as Nurse Practitioner (Internal Medicine)  Extended Emergency Contact Information Primary Emergency Contact: [facility] Address: [id number] [place], [place] [address] [name] of [place] Phone: [phone] Work Phone: [phone] Mobile Phone: [phone] Relation: Daughter  Code Status: DNR Goals of care: Advanced Directive information Advanced Directives 03/05/2016  Does Patient Have a Medical Advance Directive? Yes  Type of Paramedic of [place];Living will;Out of facility DNR (pink MOST or yellow form)  Does patient want to make changes to medical advance directive? No - Patient declined  Copy of [place] in Chart? Yes  Pre-existing out of facility DNR order (yellow form or pink MOST form) Yellow form placed in chart (order not valid for inpatient use)     Chief Complaint  Patient presents with  . Medical Management of Chronic Issues    HPI:  Pt is a 83 y.o. female seen today for medical management of chronic diseases.      Hx of GERD, stable while on Omeprazole 20mg , blood pressure is controlled on Metoprolol 12.5mg  bid, sleep and eats well, mood is stable, taking Zoloft 75mg , Mirtazapine 7.5mg . Hx of DVT, protein C deficiency, long term anticoagulation with Xarelto.  Past Medical History:  Diagnosis Date  . Depression   . Hypertension   . Stroke [facility])    Past Surgical History:  Procedure Laterality Date  . ABDOMINAL HYSTERECTOMY    . APPENDECTOMY    . BREAST SURGERY    . MASTECTOMY      Allergies  Allergen Reactions  . Hydrocodone Nausea And Vomiting  . Morphine And Related Nausea And Vomiting  . Actonel [Risedronate Sodium] Other (See Comments)      unknown  . Darvon [Propoxyphene Hcl] Other (See Comments)    unknown  . Oxycodone Hcl Other (See Comments)    unknown  . Pneumovax [Pneumococcal Polysaccharide Vaccine] Other (See Comments)    unknown    Allergies as of 03/05/2016      Reactions   Hydrocodone Nausea And Vomiting   Morphine And Related Nausea And Vomiting   Actonel [risedronate Sodium] Other (See Comments)   unknown   Darvon [propoxyphene Hcl] Other (See Comments)   unknown   Oxycodone Hcl Other (See Comments)   unknown   Pneumovax [pneumococcal Polysaccharide Vaccine] Other (See Comments)   unknown      Medication List       Accurate as of 03/05/16  1:32 PM. Always use your most recent med list.          acetaminophen 325 MG tablet Commonly known as:  TYLENOL Take 650 mg by mouth every 4 (four) hours as needed for pain.   atorvastatin 10 MG tablet Commonly known as:  LIPITOR Take 10 mg by mouth daily.   CALTRATE 600+D 600-400 MG-UNIT tablet Generic drug:  Calcium Carbonate-Vitamin D Take 1 tablet by mouth 2 (two) times daily.   fluticasone 50 MCG/ACT nasal spray Commonly known as:  FLONASE Place 1 spray into both nostrils as needed for allergies.   GAS RELIEF 80 PO Take by mouth. Take one tablet after meals and at bedtime for gas relief   hydrocortisone 2.5 % cream Apply 1  application topically 2 (two) times daily. For itchy rash   hydrocortisone cream 1 % Apply 1 application topically. Preparation H apply to hemorrhoids three times a day as needed for hemorrhoids   I-VITE Tabs Take 1 tablet by mouth daily.   metoprolol tartrate 12.5 mg Tabs tablet Commonly known as:  LOPRESSOR Take 12.5 mg by mouth 2 (two) times daily. For HTN   mirtazapine 7.5 MG tablet Commonly known as:  REMERON Take 7.5 mg by mouth at bedtime. For depressive disorder   nystatin powder Generic drug:  nystatin Apply topically as needed. Under left breast   omeprazole 20 MG capsule Commonly known as:   PRILOSEC Take 20 mg by mouth daily.   polyethylene glycol packet Commonly known as:  MIRALAX / GLYCOLAX Take 17 g by mouth daily.   sertraline 25 MG tablet Commonly known as:  ZOLOFT Take 75 mg by mouth daily.   UNABLE TO FIND Take 5 mLs by mouth 4 (four) times daily -  before meals and at bedtime. Med Name: Magic Mouthwash as needed   XARELTO 20 MG Tabs tablet Generic drug:  rivaroxaban Take 20 mg by mouth daily. Reported on 04/26/2015       Review of Systems  Constitutional: Negative for chills, diaphoresis and fever.  HENT: Positive for hearing loss. Negative for congestion, ear discharge and sore throat.   Eyes: Negative for pain, discharge and redness.  Respiratory: Negative for cough and wheezing.   Cardiovascular: Negative for chest pain, palpitations and leg swelling.  Gastrointestinal: Negative for abdominal pain, blood in stool, constipation, diarrhea, nausea and vomiting.       Epigastric pain in the past, Omeprazole helped. Now c/o bloating after meals especially after dinner.,   Genitourinary: Positive for frequency. Negative for dysuria, flank pain, hematuria and urgency.  Musculoskeletal: Positive for gait problem. Negative for back pain, myalgias and neck pain.       Left breast pain travels to the right, gravity worsens pain when she stands up-resolved  Skin: Positive for rash.       The right mastectomy.  A small less than 1cm smooth mobile nose tip consistency nodule palpated mid left fore arm. A >1cm skin lesion with rough surface,  various shade of brown color, and irregular border represent [facility] dermatology consultation. Lateral the right upper leg scratched injuries.   Neurological: Negative for dizziness, tremors, seizures, weakness and headaches.       History of CVA 2013. Residual problems of dysarthria and right hemiparesis.  Psychiatric/Behavioral: Positive for confusion and decreased concentration. Negative for hallucinations. The patient is not  nervous/anxious.     Immunization History  Administered Date(s) Administered  . Influenza-Unspecified 11/20/2013, 09/26/2014, 10/24/2015   Pertinent  Health Maintenance Due  Topic Date Due  . DEXA SCAN  07/18/1994  . PNA vac Low Risk Adult (1 of 2 - PCV13) 07/18/1994  . INFLUENZA VACCINE  Completed   No flowsheet data found. Functional Status Survey:    Vitals:   03/05/16 1252  BP: 118/66  Pulse: 74  Resp: 16  Temp: 97.1 F (36.2 C)  Weight: 160 lb (72.6 kg)  Height: 5\' 3"  (1.6 m)   Body mass index is 28.34 kg/m. Physical Exam  Constitutional: She appears well-developed.  HENT:  Head: Normocephalic and atraumatic.  Left Ear: External ear normal.  Nose: Mucosal edema and rhinorrhea present. Right sinus exhibits no maxillary sinus tenderness and no frontal sinus tenderness. Left sinus exhibits no maxillary sinus tenderness and no frontal sinus  tenderness.  Mouth/Throat: Oropharynx is clear and moist. Mucous membranes are not pale, not dry and not cyanotic. No oropharyngeal exudate.  Eyes: Conjunctivae and EOM are normal. Pupils are equal, round, and reactive to light. Right eye exhibits no discharge. Left eye exhibits no discharge.  Neck: Normal range of motion. Neck supple. No JVD present. No thyromegaly present.  Cardiovascular: Normal rate, regular rhythm and normal heart sounds.  Exam reveals no friction rub.   No murmur heard. Pulmonary/Chest: Effort normal and breath sounds normal. No respiratory distress. She has no wheezes. She has no rales.  Abdominal: Soft. Bowel sounds are normal. She exhibits no distension. There is no tenderness. There is no rebound and no guarding.     Genitourinary: Vagina normal.  Musculoskeletal: She exhibits no edema or tenderness.       Right shoulder: She exhibits decreased strength.  Right sided paralysis with muscle strength 4/5  Lymphadenopathy:    She has no cervical adenopathy.  Neurological: She is alert. She displays abnormal  reflex. No cranial nerve deficit. She exhibits abnormal muscle tone. Coordination abnormal.  Dysarthria. Right hemiparesis. Dementia.  Skin: Skin is warm and dry. No rash noted.  S/p right mastectomy. The right mastectomy. A small less than 1cm smooth mobile nose tip consistency nodule palpated mid left fore arm. A >1cm skin lesion with rough surface,  various shade of brown color, and irregular border represent [facility] dermatology consultation.  Splotchy spots lateral right upper leg.  Lateral the right upper leg scratched injuries.      Psychiatric: Her mood appears not anxious. Her affect is not angry, not blunt, not labile and not inappropriate. Her speech is delayed and tangential. Her speech is not rapid and/or pressured and not slurred. She is slowed. She is not agitated, not aggressive, not hyperactive, not withdrawn, not actively hallucinating and not combative. Thought content is not paranoid and not delusional. Cognition and memory are impaired. She does not express impulsivity or inappropriate judgment. She does not exhibit a depressed mood. She is communicative. She exhibits abnormal recent memory. She exhibits normal remote memory.  Mild anxiety. She is attentive.    Labs reviewed:  Recent Labs  06/12/15 08/02/15  NA 140 139  K 3.8 3.5  BUN 11 11  CREATININE 0.9 0.7    Recent Labs  06/12/15 08/02/15  AST 15 20  ALT 12 16  ALKPHOS 100 107    Recent Labs  07/19/15 08/02/15  WBC 5.3 7.3  HGB 8.9* 14.6  HCT 28* 44  PLT 501* 254   Lab Results  Component Value Date   TSH 1.57 12/25/2015   Lab Results  Component Value Date   HGBA1C 5.5 08/02/2015   Lab Results  Component Value Date   CHOL 193 12/25/2015   HDL 45 12/25/2015   LDLCALC 112 12/25/2015   TRIG 182 (A) 12/25/2015    Significant Diagnostic Results in last 30 days:  No results found.  Assessment/Plan Essential hypertension, benign Controlled, continue Metoprolol 12.5mg  bid. Monitor Bp/P  daily.    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 Continue Xarelto  GERD (gastroesophageal reflux disease) Stable, continue Omeprazole 20mg  daily   Constipation Stable, continue MiraLax qd   Protein S deficiency Protein C and S deficiency, continue Xarelto  Depression Mood is stable, continue Sertraline 75mg , Mirtazapine 7.5mg (sleeps well at night and weight has been stabilized   Abdominal bloating Continue Gas X     Family/ staff Communication: AL  Labs/tests ordered:  none

## 2016-03-05 NOTE — Assessment & Plan Note (Signed)
Stable, continue Omeprazole 20mg daily.  

## 2016-03-06 ENCOUNTER — Encounter: Payer: Self-pay | Admitting: Internal Medicine

## 2016-03-06 ENCOUNTER — Non-Acute Institutional Stay: Payer: Medicare Other | Admitting: Internal Medicine

## 2016-03-06 DIAGNOSIS — D059 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: Secondary | ICD-10-CM | POA: Insufficient documentation

## 2016-03-06 DIAGNOSIS — B9789 Other viral agents as the cause of diseases classified elsewhere: Secondary | ICD-10-CM | POA: Diagnosis not present

## 2016-03-06 DIAGNOSIS — I629 Nontraumatic intracranial hemorrhage, unspecified: Secondary | ICD-10-CM | POA: Insufficient documentation

## 2016-03-06 DIAGNOSIS — I10 Essential (primary) hypertension: Secondary | ICD-10-CM | POA: Insufficient documentation

## 2016-03-06 DIAGNOSIS — J069 Acute upper respiratory infection, unspecified: Secondary | ICD-10-CM

## 2016-03-06 DIAGNOSIS — I639 Cerebral infarction, unspecified: Secondary | ICD-10-CM | POA: Insufficient documentation

## 2016-03-06 DIAGNOSIS — F339 Major depressive disorder, recurrent, unspecified: Secondary | ICD-10-CM | POA: Insufficient documentation

## 2016-03-06 DIAGNOSIS — F419 Anxiety disorder, unspecified: Secondary | ICD-10-CM | POA: Insufficient documentation

## 2016-03-06 DIAGNOSIS — I69920 Aphasia following unspecified cerebrovascular disease: Secondary | ICD-10-CM | POA: Insufficient documentation

## 2016-03-06 DIAGNOSIS — I69991 Dysphagia following unspecified cerebrovascular disease: Secondary | ICD-10-CM | POA: Insufficient documentation

## 2016-03-06 DIAGNOSIS — M792 Neuralgia and neuritis, unspecified: Secondary | ICD-10-CM | POA: Insufficient documentation

## 2016-03-06 DIAGNOSIS — I8291 Chronic embolism and thrombosis of unspecified vein: Secondary | ICD-10-CM | POA: Insufficient documentation

## 2016-03-06 DIAGNOSIS — F32A Depression, unspecified: Secondary | ICD-10-CM | POA: Insufficient documentation

## 2016-03-06 NOTE — Progress Notes (Signed)
Progress Note    Location:  [place] Room Number: [phone] Place of Service:  ALF [phone]) Provider:  Jeanmarie Hubert, MD  Patient Care Team: Estill Dooms, MD as PCP - General (Internal Medicine) Man Otho Darner, NP as Nurse Practitioner (Internal Medicine) Juluis Rainier as Consulting Physician (Optometry) Monna Fam, MD as Consulting Physician (Ophthalmology)  Extended Emergency Contact Information Primary Emergency Contact: [facility] Address: [id number], [place] [address] [name] of [place] Phone: [phone] Work Phone: [phone] Mobile Phone: [phone] Relation: Daughter  Code Status:  DNR Goals of care: Advanced Directive information Advanced Directives 03/06/2016  Does Patient Have a Medical Advance Directive? Yes  Type of Advance Directive Out of facility DNR (pink MOST or yellow form);[place];Living will  Does patient want to make changes to medical advance directive? -  Copy of [place] in Chart? Yes  Pre-existing out of facility DNR order (yellow form or pink MOST form) Yellow form placed in chart (order not valid for inpatient use)     Chief Complaint  Patient presents with  . Acute Visit    patient has chills, congestion    HPI:  Pt is a 83 y.o. female seen today for an acute visit for chills and upper respiratory congestion. Denies much cough. No fever. No increase in dyspnea. Daughter was worried she might have pneumonia and requested visitation.   Past Medical History:  Diagnosis Date  . Aphasia following unspecified cerebrovascular disease   . 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 Depression   . Dysphagia following cerebrovascular disease   . Hypertension   . Insomnia   . Macular degeneration   . Major depressive disorder, recurrent ([place])   . Neuralgia and neuritis, unspecified ([place])   . Nontraumatic intracranial  hemorrhage ([place])   . Stroke ([place])   . 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   . 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 ABDOMINAL HYSTERECTOMY    . APPENDECTOMY    . BREAST SURGERY    . MASTECTOMY Right     Allergies  Allergen Reactions  . Hydrocodone Nausea And Vomiting  . Morphine And Related Nausea And Vomiting  . Actonel [Risedronate Sodium] Other (See Comments)    unknown  . Darvon [Propoxyphene Hcl] Other (See Comments)    unknown  . Oxycodone Hcl Other (See Comments)    unknown  . Pneumovax [Pneumococcal Polysaccharide Vaccine] Other (See Comments)    unknown    Allergies as of 03/06/2016      Reactions   Hydrocodone Nausea And Vomiting   Morphine And Related Nausea And Vomiting   Actonel [risedronate Sodium] Other (See Comments)   unknown   Darvon [propoxyphene Hcl] Other (See Comments)   unknown   Oxycodone Hcl Other (See Comments)   unknown   Pneumovax [pneumococcal Polysaccharide Vaccine] Other (See Comments)   unknown      Medication List       Accurate as of 03/06/16 10:57 AM. Always use your most recent med list.          acetaminophen 325 MG tablet Commonly known as:  TYLENOL Take 650 mg by mouth every 4 (four) hours as needed for pain.   atorvastatin 10 MG tablet Commonly known as:  LIPITOR Take 10 mg by mouth daily.   CALTRATE 600+D 600-400 MG-UNIT tablet Generic drug:  Calcium Carbonate-Vitamin D Take 1 tablet by mouth 2 (two) times daily.   fluticasone 50 MCG/ACT nasal spray Commonly known as:  FLONASE Place 1 spray into both nostrils as needed for allergies.   GAS RELIEF 80 PO Take by mouth. Take one tablet after meals and at bedtime for gas relief   hydrocortisone 2.5 % cream Apply 1 application topically 2 (two) times daily. For itchy rash   hydrocortisone cream 1 % Apply 1 application topically. Preparation H apply to  hemorrhoids three times a day as needed for hemorrhoids   I-VITE Tabs Take 1 tablet by mouth daily.   metoprolol tartrate 12.5 mg Tabs tablet Commonly known as:  LOPRESSOR Take 12.5 mg by mouth 2 (two) times daily. For HTN   mirtazapine 7.5 MG tablet Commonly known as:  REMERON Take 7.5 mg by mouth at bedtime. For depressive disorder   nystatin powder Generic drug:  nystatin Apply topically as needed. Under left breast   omeprazole 20 MG capsule Commonly known as:  PRILOSEC Take 20 mg by mouth daily.   polyethylene glycol packet Commonly known as:  MIRALAX / GLYCOLAX Take 17 g by mouth daily.   sertraline 25 MG tablet Commonly known as:  ZOLOFT Take 75 mg by mouth daily.   UNABLE TO FIND Take 5 mLs by mouth 4 (four) times daily -  before meals and at bedtime. Med Name: Magic Mouthwash as needed   XARELTO 20 MG Tabs tablet Generic drug:  rivaroxaban Take 20 mg by mouth daily. Reported on 04/26/2015       Review of Systems  Constitutional: Negative for chills, diaphoresis and fever.  HENT: Positive for hearing loss, rhinorrhea, sinus pressure and sore throat (mild). Negative for congestion and ear discharge.   Eyes: Negative for pain, discharge and redness.  Respiratory: Negative for cough and wheezing.   Cardiovascular: Negative for chest pain, palpitations and leg swelling.  Gastrointestinal: Negative for abdominal pain, blood in stool, constipation, diarrhea, nausea and vomiting.       Epigastric pain in the past, Omeprazole helped. Now c/o bloating after meals especially after dinner.,   Genitourinary: Positive for frequency. Negative for dysuria, flank pain, hematuria and urgency.  Musculoskeletal: Positive for gait problem. Negative for back pain, myalgias and neck pain.       Left breast pain travels to the right, gravity worsens pain when she stands up-resolved  Skin: Positive for rash.       The right mastectomy.  A small less than 1cm smooth mobile nose tip  consistency nodule palpated mid left fore arm. A >1cm skin lesion with rough surface,  various shade of brown color, and irregular border represent [facility] dermatology consultation. Lateral the right upper leg scratched injuries.   Neurological: Negative for dizziness, tremors, seizures, weakness and headaches.       History of CVA 2013. Residual problems of dysarthria and right hemiparesis.  Psychiatric/Behavioral: Positive for confusion and decreased concentration. Negative for hallucinations. The patient is not nervous/anxious.     Immunization History  Administered Date(s) Administered  . Influenza-Unspecified 11/20/2013, 09/26/2014, 10/24/2015   Pertinent  Health Maintenance Due  Topic Date Due  . DEXA SCAN  07/18/1994  . PNA vac Low Risk Adult (1 of 2 - PCV13) 07/18/1994  . INFLUENZA VACCINE  Completed   No flowsheet data found. Functional Status Survey:    Vitals:   03/06/16 1016  BP: (!) 142/80  Pulse: 62  Resp: 14  Temp: 97.7 F (36.5 C)  SpO2: 96%  Weight: 160 lb (72.6 kg)  Height: 5\' 3"  (1.6 m)   Body mass index is 28.34 kg/m. Physical Exam  Constitutional: She appears well-developed.  HENT:  Head: Normocephalic and atraumatic.  Left Ear: External ear normal.  Nose: Mucosal edema and rhinorrhea present. Right sinus exhibits no maxillary sinus tenderness and no frontal sinus tenderness. Left sinus exhibits no maxillary sinus tenderness and no frontal sinus tenderness.  Mouth/Throat: Oropharynx is clear and moist. Mucous membranes are not pale, not dry and not cyanotic. No oropharyngeal exudate.  Head congestion.  Eyes: Conjunctivae and EOM are normal. Pupils are equal, round, and reactive to light. Right eye exhibits no discharge. Left eye exhibits no discharge.  Neck: Normal range of motion. Neck supple. No JVD present. No thyromegaly present.  Cardiovascular: Normal rate, regular rhythm and normal heart sounds.  Exam reveals no friction rub.   No murmur  heard. Pulmonary/Chest: Effort normal and breath sounds normal. No respiratory distress. She has no wheezes. She has no rales.  Abdominal: Soft. Bowel sounds are normal. She exhibits no distension. There is no tenderness. There is no rebound and no guarding.     Genitourinary: Vagina normal.  Musculoskeletal: She exhibits no edema or tenderness.       Right shoulder: She exhibits decreased strength.  Right sided paralysis with muscle strength 4/5  Lymphadenopathy:    She has no cervical adenopathy.  Neurological: She is alert. She displays abnormal reflex. No cranial nerve deficit. She exhibits abnormal muscle tone. Coordination abnormal.  Dysarthria. Right hemiparesis. Dementia.  Skin: Skin is warm and dry. No rash noted.  S/p right mastectomy.   Psychiatric: Her mood appears not anxious. Her affect is not angry, not blunt, not labile and not inappropriate. Her speech is delayed and tangential. Her speech is not rapid and/or pressured and not slurred. She is slowed. She is not agitated, not aggressive, not hyperactive, not withdrawn, not actively hallucinating and not combative. Thought content is not paranoid and not delusional. Cognition and memory are impaired. She does not express impulsivity or inappropriate judgment. She does not exhibit a depressed mood. She is communicative. She exhibits abnormal recent memory. She exhibits normal remote memory.  Mild anxiety. She is attentive.    Labs reviewed:  Recent Labs  06/12/15 08/02/15  NA 140 139  K 3.8 3.5  BUN 11 11  CREATININE 0.9 0.7    Recent Labs  06/12/15 08/02/15  AST 15 20  ALT 12 16  ALKPHOS 100 107    Recent Labs  07/19/15 08/02/15  WBC 5.3 7.3  HGB 8.9* 14.6  HCT 28* 44  PLT 501* 254   Lab Results  Component Value Date   TSH 1.57 12/25/2015   Lab Results  Component Value Date   HGBA1C 5.5 08/02/2015   Lab Results  Component Value Date   CHOL 193 12/25/2015   HDL 45 12/25/2015   LDLCALC 112  12/25/2015   TRIG 182 (A) 12/25/2015    Assessment/Plan  1. Viral upper respiratory illness Observe. No new medications ordered. No signs of pneumonia

## 2016-04-02 ENCOUNTER — Encounter: Payer: Self-pay | Admitting: Nurse Practitioner

## 2016-04-02 NOTE — Progress Notes (Signed)
This encounter was created in error - please disregard.

## 2016-06-05 ENCOUNTER — Non-Acute Institutional Stay: Payer: Medicare Other | Admitting: Nurse Practitioner

## 2016-06-05 ENCOUNTER — Encounter: Payer: Self-pay | Admitting: Nurse Practitioner

## 2016-06-05 DIAGNOSIS — K219 Gastro-esophageal reflux disease without esophagitis: Secondary | ICD-10-CM | POA: Diagnosis not present

## 2016-06-05 DIAGNOSIS — I10 Essential (primary) hypertension: Secondary | ICD-10-CM | POA: Diagnosis not present

## 2016-06-05 DIAGNOSIS — F339 Major depressive disorder, recurrent, unspecified: Secondary | ICD-10-CM

## 2016-06-05 DIAGNOSIS — I82402 Acute embolism and thrombosis of unspecified deep veins of left lower extremity: Secondary | ICD-10-CM | POA: Diagnosis not present

## 2016-06-05 DIAGNOSIS — I6300 Cerebral infarction due to thrombosis of unspecified precerebral artery: Secondary | ICD-10-CM | POA: Diagnosis not present

## 2016-06-05 DIAGNOSIS — K5900 Constipation, unspecified: Secondary | ICD-10-CM

## 2016-06-05 NOTE — Assessment & Plan Note (Signed)
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 Continue Xarelto

## 2016-06-05 NOTE — Assessment & Plan Note (Signed)
Controlled, continue Metoprolol 12.5mg  bid. Monitor Bp/P daily. Update CBC CMP TSH Hgb a1c

## 2016-06-05 NOTE — Assessment & Plan Note (Signed)
Stable, continue MiraLax qd.  

## 2016-06-05 NOTE — Progress Notes (Signed)
Location:  [place] Room Number: 546-F Place of Service:  ALF [phone]) Provider:  Mast, Manxie  NP  Estill Dooms, MD  Patient Care Team: Estill Dooms, MD as PCP - General (Internal Medicine) Mast, Man X, NP as Nurse Practitioner (Internal Medicine) Juluis Rainier as Consulting Physician (Optometry) Monna Fam, MD as Consulting Physician (Ophthalmology)  Extended Emergency Contact Information Primary Emergency Contact: [facility] Address: [address], [place] [address] [name] of [place] Phone: [phone] Work Phone: [phone] Mobile Phone: [phone] Relation: Daughter  Code Status: DNR Goals of care: Advanced Directive information Advanced Directives 06/05/2016  Does Patient Have a Medical Advance Directive? Yes  Type of Paramedic of [place];Living will  Does patient want to make changes to medical advance directive? No - Patient declined  Copy of [place] in Chart? Yes  Pre-existing out of facility DNR order (yellow form or pink MOST form) -     Chief Complaint  Patient presents with  . Medical Management of Chronic Issues    HPI:  Pt is a 83 y.o. female seen today for medical management of chronic diseases.      Hx of GERD, stable while on Omeprazole 20mg , blood pressure is controlled on Metoprolol 12.5mg  bid, sleep and eats well, mood is stable, taking Zoloft 75mg , Mirtazapine 7.5mg . Hx of DVT, protein C deficiency, long term anticoagulation with Xarelto.  Past Medical History:  Diagnosis Date  . Aphasia following unspecified cerebrovascular disease   . 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 Depression   . Dysphagia following cerebrovascular disease   . Hypertension   . Insomnia   . Macular degeneration   . Major depressive disorder, recurrent ([place])   . Neuralgia and neuritis, unspecified ([place])   . Nontraumatic  intracranial hemorrhage ([place])   . Stroke ([place])   . 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   . 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 ABDOMINAL HYSTERECTOMY    . APPENDECTOMY    . BREAST SURGERY    . MASTECTOMY Right     Allergies  Allergen Reactions  . Hydrocodone Nausea And Vomiting  . Morphine And Related Nausea And Vomiting  . Actonel [Risedronate Sodium] Other (See Comments)    unknown  . Darvon [Propoxyphene Hcl] Other (See Comments)    unknown  . Oxycodone Hcl Other (See Comments)    unknown  . Pneumovax [Pneumococcal Polysaccharide Vaccine] Other (See Comments)    unknown    Allergies as of 06/05/2016      Reactions   Hydrocodone Nausea And Vomiting   Morphine And Related Nausea And Vomiting   Actonel [risedronate Sodium] Other (See Comments)   unknown   Darvon [propoxyphene Hcl] Other (See Comments)   unknown   Oxycodone Hcl Other (See Comments)   unknown   Pneumovax [pneumococcal Polysaccharide Vaccine] Other (See Comments)   unknown      Medication List       Accurate as of 06/05/16 11:33 AM. Always use your most recent med list.          acetaminophen 325 MG tablet Commonly known as:  TYLENOL Take 650 mg by mouth every 4 (four) hours as needed for pain.   atorvastatin 10 MG tablet Commonly known as:  LIPITOR Take 10 mg by mouth daily.   CALTRATE 600+D 600-400 MG-UNIT tablet Generic drug:  Calcium Carbonate-Vitamin D Take 1 tablet by mouth 2 (two) times daily.   fluticasone 50 MCG/ACT nasal spray Commonly known as:  FLONASE Place 1 spray into both nostrils as needed for allergies.   GAS RELIEF 80 PO Take by mouth. Take one tablet after meals and at bedtime for gas relief   hydrocortisone 2.5 % cream Apply 1 application topically 2 (two) times daily. For itchy rash   hydrocortisone cream 1 % Apply 1 application topically. Preparation  H apply to hemorrhoids three times a day as needed for hemorrhoids   I-VITE Tabs Take 1 tablet by mouth daily.   metoprolol tartrate 12.5 mg Tabs tablet Commonly known as:  LOPRESSOR Take 12.5 mg by mouth 2 (two) times daily. For HTN   mirtazapine 7.5 MG tablet Commonly known as:  REMERON Take 7.5 mg by mouth at bedtime. For depressive disorder   nystatin powder Generic drug:  nystatin Apply topically as needed. Under left breast   omeprazole 20 MG capsule Commonly known as:  PRILOSEC Take 20 mg by mouth daily.   polyethylene glycol packet Commonly known as:  MIRALAX / GLYCOLAX Take 17 g by mouth daily.   sertraline 25 MG tablet Commonly known as:  ZOLOFT Take 75 mg by mouth daily.   UNABLE TO FIND Take 5 mLs by mouth 4 (four) times daily -  before meals and at bedtime. Med Name: Magic Mouthwash as needed   XARELTO 20 MG Tabs tablet Generic drug:  rivaroxaban Take 20 mg by mouth daily. Reported on 04/26/2015       Review of Systems  Constitutional: Negative for chills, diaphoresis and fever.  HENT: Positive for hearing loss. Negative for congestion, ear discharge and sore throat.   Eyes: Negative for pain, discharge and redness.  Respiratory: Negative for cough and wheezing.   Cardiovascular: Negative for chest pain, palpitations and leg swelling.  Gastrointestinal: Negative for abdominal pain, blood in stool, constipation, diarrhea, nausea and vomiting.       Epigastric pain in the past, Omeprazole helped. Now c/o bloating after meals especially after dinner.,   Genitourinary: Positive for frequency. Negative for dysuria, flank pain, hematuria and urgency.  Musculoskeletal: Positive for gait problem. Negative for back pain, myalgias and neck pain.       Left breast pain travels to the right, gravity worsens pain when she stands up-resolved  Skin: Positive for rash.       The right mastectomy.  A small less than 1cm smooth mobile nose tip consistency nodule palpated  mid left fore arm. A >1cm skin lesion with rough surface,  various shade of brown color, and irregular border represent [facility] dermatology consultation. Lateral the right upper leg scratched injuries.   Neurological: Negative for dizziness, tremors, seizures, weakness and headaches.       History of CVA 2013. Residual problems of dysarthria and right hemiparesis.  Psychiatric/Behavioral: Positive for confusion and decreased concentration. Negative for hallucinations. The patient is not nervous/anxious.     Immunization History  Administered Date(s) Administered  . Influenza-Unspecified 11/20/2013, 09/26/2014, 10/24/2015   Pertinent  Health Maintenance Due  Topic Date Due  . DEXA SCAN  07/18/1994  . PNA vac Low Risk Adult (1 of 2 - PCV13) 07/18/1994  . INFLUENZA VACCINE  08/06/2016   No flowsheet data found. Functional Status Survey:    Vitals:   06/05/16 1121  BP: 122/60  Pulse: 62  Resp: 16  Temp: 98.6 F (37 C)  Weight: 159 lb (72.1 kg)  Height: 5\' 3"  (1.6 m)   Body mass index is 28.17 kg/m. Physical Exam  Constitutional: She appears well-developed.  HENT:  Head: Normocephalic and atraumatic.  Left Ear: External ear normal.  Nose: Mucosal edema and rhinorrhea present. Right sinus exhibits no maxillary sinus tenderness and no frontal sinus tenderness. Left sinus exhibits no maxillary sinus tenderness and no frontal sinus tenderness.  Mouth/Throat: Oropharynx is clear and moist. Mucous membranes are not pale, not dry and not cyanotic. No oropharyngeal exudate.  Eyes: Conjunctivae and EOM are normal. Pupils are equal, round, and reactive to light. Right eye exhibits no discharge. Left eye exhibits no discharge.  Neck: Normal range of motion. Neck supple. No JVD present. No thyromegaly present.  Cardiovascular: Normal rate, regular rhythm and normal heart sounds.  Exam reveals no friction rub.   No murmur heard. Pulmonary/Chest: Effort normal and breath sounds normal. No  respiratory distress. She has no wheezes. She has no rales.  Abdominal: Soft. Bowel sounds are normal. She exhibits no distension. There is no tenderness. There is no rebound and no guarding.     Genitourinary: Vagina normal.  Musculoskeletal: She exhibits no edema or tenderness.       Right shoulder: She exhibits decreased strength.  Right sided paralysis with muscle strength 4/5  Lymphadenopathy:    She has no cervical adenopathy.  Neurological: She is alert. She displays abnormal reflex. No cranial nerve deficit. She exhibits abnormal muscle tone. Coordination abnormal.  Dysarthria. Right hemiparesis. Dementia.  Skin: Skin is warm and dry. No rash noted.  S/p right mastectomy. The right mastectomy. A small less than 1cm smooth mobile nose tip consistency nodule palpated mid left fore arm. A >1cm skin lesion with rough surface,  various shade of brown color, and irregular border represent [facility] dermatology consultation.  Splotchy spots lateral right upper leg.  Lateral the right upper leg scratched injuries.      Psychiatric: Her mood appears not anxious. Her affect is not angry, not blunt, not labile and not inappropriate. Her speech is delayed and tangential. Her speech is not rapid and/or pressured and not slurred. She is slowed. She is not agitated, not aggressive, not hyperactive, not withdrawn, not actively hallucinating and not combative. Thought content is not paranoid and not delusional. Cognition and memory are impaired. She does not express impulsivity or inappropriate judgment. She does not exhibit a depressed mood. She is communicative. She exhibits abnormal recent memory. She exhibits normal remote memory.  Mild anxiety. She is attentive.    Labs reviewed:  Recent Labs  06/12/15 08/02/15  NA 140 139  K 3.8 3.5  BUN 11 11  CREATININE 0.9 0.7    Recent Labs  06/12/15 08/02/15  AST 15 20  ALT 12 16  ALKPHOS 100 107    Recent Labs  07/19/15 08/02/15  WBC 5.3  7.3  HGB 8.9* 14.6  HCT 28* 44  PLT 501* 254   Lab Results  Component Value Date   TSH 1.57 12/25/2015   Lab Results  Component Value Date   HGBA1C 5.5 08/02/2015   Lab Results  Component Value Date   CHOL 193 12/25/2015   HDL 45 12/25/2015   LDLCALC 112 12/25/2015   TRIG 182 (A) 12/25/2015    Significant Diagnostic Results in last 30 days:  No results found.  Assessment/Plan Essential hypertension, benign Controlled, continue Metoprolol 12.5mg  bid. Monitor Bp/P daily. Update CBC CMP  TSH Hgb a1c   DVT (deep venous thrombosis) Onset 02/01/2012, left extremity DVT, coumadin treatment should be for 3 months. Off Coumadin. Started Xarelto 20mg  for Protein C/S deficiency.  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 Continue Xarelto  GERD (gastroesophageal reflux disease) Stable, continue Omeprazole 20mg  daily   Constipation Stable, continue MiraLax qd    Depression Mood is stable, continue Sertraline 75mg , Mirtazapine 7.5mg (sleeps well at night and weight has been stabilized     Family/ staff Communication: AL  Labs/tests ordered: CBC CMP TSH Hgb a1c

## 2016-06-05 NOTE — Assessment & Plan Note (Signed)
Mood is stable, continue Sertraline 75mg , Mirtazapine 7.5mg (sleeps well at night and weight has been stabilized

## 2016-06-05 NOTE — Assessment & Plan Note (Signed)
Stable, continue Omeprazole 20mg daily.  

## 2016-06-05 NOTE — Assessment & Plan Note (Signed)
Onset 02/01/2012, left extremity DVT, coumadin treatment should be for 3 months. Off Coumadin. Started Xarelto 20mg  for Protein C/S deficiency.

## 2016-07-07 ENCOUNTER — Encounter: Payer: Self-pay | Admitting: Nurse Practitioner

## 2016-07-07 ENCOUNTER — Non-Acute Institutional Stay: Payer: Medicare Other | Admitting: Nurse Practitioner

## 2016-07-07 DIAGNOSIS — D6859 Other primary thrombophilia: Secondary | ICD-10-CM

## 2016-07-07 DIAGNOSIS — W540XXA Bitten by dog, initial encounter: Secondary | ICD-10-CM

## 2016-07-07 DIAGNOSIS — S41151A Open bite of right upper arm, initial encounter: Secondary | ICD-10-CM | POA: Diagnosis not present

## 2016-07-07 NOTE — Progress Notes (Signed)
Location:  [facility] Room Number: 762-U Place of Service:  ALF [phone]) Provider:  Mast, Manxie  NP  Estill Dooms, MD  Patient Care Team: Estill Dooms, MD as PCP - General (Internal Medicine) Mast, Man X, NP as Nurse Practitioner (Internal Medicine) Juluis Rainier as Consulting Physician (Optometry) Monna Fam, MD as Consulting Physician (Ophthalmology)  Extended Emergency Contact Information Primary Emergency Contact: [facility] Address: [address], [place] [address] [name] of [place] Phone: [phone] Work Phone: [phone] Mobile Phone: [phone] Relation: Daughter  Code Status:  DNR Goals of care: Advanced Directive information Advanced Directives 07/07/2016  Does Patient Have a Medical Advance Directive? Yes  Type of Paramedic of [place];Living will  Does patient want to make changes to medical advance directive? No - Patient declined  Copy of [place] in Chart? Yes  Pre-existing out of facility DNR order (yellow form or pink MOST form) -     Chief Complaint  Patient presents with  . Acute Visit    Tetanus shot - dog bite    HPI:  Pt is a 83 y.o. female seen today for an acute visit for  A small about 1cm size of dog bit wound @ dorsum of the right wrist, no s/s of infection. S/p Tdap     Hx of DVT, protein C deficiency, long term anticoagulation with Xarelto.   Past Medical History:  Diagnosis Date  . Aphasia following unspecified cerebrovascular disease   . 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 Depression   . Dysphagia following cerebrovascular disease   . Hypertension   . Insomnia   . Macular degeneration   . Major depressive disorder, recurrent ([place])   . Neuralgia and neuritis, unspecified (CODE)   . Nontraumatic intracranial hemorrhage ([place])   . Stroke ([place])   . Unspecified type of carcinoma in situ of  unspecified breast   . 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 ABDOMINAL HYSTERECTOMY    . APPENDECTOMY    . BREAST SURGERY    . MASTECTOMY Right     Allergies  Allergen Reactions  . Hydrocodone Nausea And Vomiting  . Morphine And Related Nausea And Vomiting  . Actonel [Risedronate Sodium] Other (See Comments)    unknown  . Darvon [Propoxyphene Hcl] Other (See Comments)    unknown  . Oxycodone Hcl Other (See Comments)    unknown  . Pneumovax [Pneumococcal Polysaccharide Vaccine] Other (See Comments)    unknown    Outpatient Encounter Prescriptions as of 07/07/2016  Medication Sig  . acetaminophen (TYLENOL) 325 MG tablet Take 650 mg by mouth every 4 (four) hours as needed for pain.  Marland Kitchen atorvastatin (LIPITOR) 10 MG tablet Take 10 mg by mouth daily.  . Calcium Carbonate-Vitamin D (CALTRATE 600+D) 600-400 MG-UNIT tablet Take 1 tablet by mouth 2 (two) times daily.  . fluticasone (FLONASE) 50 MCG/ACT nasal spray Place 1 spray into both nostrils as needed for allergies.   . hydrocortisone 2.5 % cream Apply 1 application topically 2 (two) times daily. For itchy rash  . hydrocortisone cream 1 % Apply 1 application topically. Preparation H apply to hemorrhoids three times a day as needed for hemorrhoids  . metoprolol tartrate (LOPRESSOR) 12.5 mg TABS tablet Take 12.5 mg by mouth 2 (two)  times daily. For HTN  . mirtazapine (REMERON) 7.5 MG tablet Take 7.5 mg by mouth at bedtime. For depressive disorder  . Multiple Vitamins-Minerals (I-VITE) TABS Take 1 tablet by mouth daily.  Marland Kitchen nystatin (NYSTATIN) powder Apply topically as needed. Under left breast  . omeprazole (PRILOSEC) 20 MG capsule Take 20 mg by mouth daily.  . polyethylene glycol (MIRALAX / GLYCOLAX) packet Take 17 g by mouth daily.  . Rivaroxaban (XARELTO) 20 MG TABS tablet Take 20 mg by mouth daily. Reported on 04/26/2015  . sertraline (ZOLOFT) 25 MG  tablet Take 75 mg by mouth daily.  . Simethicone (GAS RELIEF 80 PO) Take by mouth. Take one tablet after meals and at bedtime for gas relief  . UNABLE TO FIND Take 5 mLs by mouth 4 (four) times daily -  before meals and at bedtime. Med Name: Magic Mouthwash as needed   No facility-administered encounter medications on file as of 07/07/2016.     Review of Systems  Constitutional: Negative.  Negative for chills, diaphoresis and fever.  HENT: Positive for hearing loss. Negative for congestion and ear discharge.   Respiratory: Negative for cough and wheezing.   Cardiovascular: Negative for chest pain, palpitations and leg swelling.  Gastrointestinal: Negative for blood in stool, constipation, diarrhea, nausea and vomiting.       Epigastric pain in the past, Omeprazole helped  Genitourinary: Positive for frequency. Negative for dysuria, flank pain, hematuria and urgency.  Musculoskeletal: Positive for gait problem. Negative for back pain, myalgias and neck pain.       Left breast pain travels to the right, gravity worsens pain when she stands up-resolved  Skin: Positive for wound. Negative for rash.       The right mastectomy.  A small about 1cm size of dog bit wound @ dorsum of the right wrist, no s/s of infection.   Neurological: Negative for dizziness, tremors, seizures, weakness and headaches.       History of CVA 2013. Residual problems of dysarthria and right hemiparesis.  Hematological: Bruises/bleeds easily.  Psychiatric/Behavioral: Positive for confusion and decreased concentration. Negative for hallucinations. The patient is not nervous/anxious.     Immunization History  Administered Date(s) Administered  . Influenza-Unspecified 11/20/2013, 09/26/2014, 10/24/2015   Pertinent  Health Maintenance Due  Topic Date Due  . DEXA SCAN  07/18/1994  . PNA vac Low Risk Adult (1 of 2 - PCV13) 07/18/1994  . INFLUENZA VACCINE  08/06/2016   No flowsheet data found. Functional Status Survey:     Vitals:   07/07/16 1301  BP: 120/68  Pulse: 66  Resp: 18  Temp: (!) 96.9 F (36.1 C)  Weight: 157 lb (71.2 kg)  Height: 5\' 3"  (1.6 m)   Body mass index is 27.81 kg/m. Physical Exam  Constitutional: She appears well-developed.  HENT:  Head: Normocephalic and atraumatic.  Left Ear: External ear normal.  Nose: Mucosal edema and rhinorrhea present. Right sinus exhibits no maxillary sinus tenderness and no frontal sinus tenderness. Left sinus exhibits no maxillary sinus tenderness and no frontal sinus tenderness.  Mouth/Throat: Oropharynx is clear and moist. Mucous membranes are not pale, not dry and not cyanotic. No oropharyngeal exudate.  Eyes: Conjunctivae and EOM are normal. Pupils are equal, round, and reactive to light. Right eye exhibits no discharge. Left eye exhibits no discharge.  Neck: Normal range of motion. Neck supple.  Cardiovascular: Normal rate, regular rhythm and normal heart sounds.  Exam reveals no friction rub.   No murmur heard. Pulmonary/Chest: Effort  normal and breath sounds normal. No respiratory distress. She has no wheezes. She has no rales.  Abdominal: Soft. Bowel sounds are normal.     Genitourinary: Vagina normal.  Musculoskeletal: She exhibits no edema or tenderness.       Right shoulder: She exhibits decreased strength.  Right sided paralysis with muscle strength 4/5  Neurological: She is alert. She displays abnormal reflex. No cranial nerve deficit. She exhibits abnormal muscle tone. Coordination abnormal.  Dysarthria. Right hemiparesis. Dementia.  Skin: Skin is warm and dry. No rash noted.   A small about 1cm size of dog bit wound @ dorsum of the right wrist, no s/s of infection.   Psychiatric: Her mood appears not anxious. Her affect is not angry, not blunt, not labile and not inappropriate. Her speech is delayed and tangential. Her speech is not rapid and/or pressured and not slurred. She is slowed. She is not agitated, not aggressive, not  hyperactive, not withdrawn, not actively hallucinating and not combative. Thought content is not paranoid and not delusional. Cognition and memory are impaired. She does not express impulsivity or inappropriate judgment. She does not exhibit a depressed mood. She is communicative. She exhibits abnormal recent memory. She exhibits normal remote memory.  Mild anxiety. She is attentive.    Labs reviewed:  Recent Labs  08/02/15  NA 139  K 3.5  BUN 11  CREATININE 0.7    Recent Labs  08/02/15  AST 20  ALT 16  ALKPHOS 107    Recent Labs  07/19/15 08/02/15  WBC 5.3 7.3  HGB 8.9* 14.6  HCT 28* 44  PLT 501* 254   Lab Results  Component Value Date   TSH 1.57 12/25/2015   Lab Results  Component Value Date   HGBA1C 5.5 08/02/2015   Lab Results  Component Value Date   CHOL 193 12/25/2015   HDL 45 12/25/2015   LDLCALC 112 12/25/2015   TRIG 182 (A) 12/25/2015    Significant Diagnostic Results in last 30 days:  No results found.  Assessment/Plan Dog bite of arm, right, initial encounter  A small about 1cm size of dog bit wound @ dorsum of the right wrist, no s/s of infection. S/p Tdap  Continue ABT ointment, observe.   Protein C deficiency Hx of CVT, Protein C and S deficiency, continue Xarelto     Family/ staff Communication: AL  Labs/tests ordered:  Tdap

## 2016-07-07 NOTE — Assessment & Plan Note (Signed)
Hx of CVT, Protein C and S deficiency, continue Xarelto

## 2016-07-07 NOTE — Assessment & Plan Note (Signed)
A small about 1cm size of dog bit wound @ dorsum of the right wrist, no s/s of infection. S/p Tdap  Continue ABT ointment, observe.

## 2016-07-13 ENCOUNTER — Inpatient Hospital Stay (HOSPITAL_COMMUNITY)
Admission: EM | Admit: 2016-07-13 | Discharge: 2016-07-17 | DRG: 689 | Disposition: A | Discharge status: Skilled Nursing Facility | Payer: Medicare Other | Attending: Internal Medicine | Admitting: Internal Medicine

## 2016-07-13 ENCOUNTER — Emergency Department (HOSPITAL_COMMUNITY): Payer: Medicare Other

## 2016-07-13 ENCOUNTER — Encounter (HOSPITAL_COMMUNITY): Payer: Self-pay | Admitting: Emergency Medicine

## 2016-07-13 DIAGNOSIS — F333 Major depressive disorder, recurrent, severe with psychotic symptoms: Secondary | ICD-10-CM | POA: Diagnosis present

## 2016-07-13 DIAGNOSIS — F0390 Unspecified dementia without behavioral disturbance: Secondary | ICD-10-CM | POA: Diagnosis present

## 2016-07-13 DIAGNOSIS — R140 Abdominal distension (gaseous): Secondary | ICD-10-CM | POA: Diagnosis present

## 2016-07-13 DIAGNOSIS — Z87440 Personal history of urinary (tract) infections: Secondary | ICD-10-CM

## 2016-07-13 DIAGNOSIS — E876 Hypokalemia: Secondary | ICD-10-CM | POA: Diagnosis not present

## 2016-07-13 DIAGNOSIS — N390 Urinary tract infection, site not specified: Secondary | ICD-10-CM | POA: Diagnosis present

## 2016-07-13 DIAGNOSIS — Z66 Do not resuscitate: Secondary | ICD-10-CM | POA: Diagnosis present

## 2016-07-13 DIAGNOSIS — R443 Hallucinations, unspecified: Secondary | ICD-10-CM

## 2016-07-13 DIAGNOSIS — I639 Cerebral infarction, unspecified: Secondary | ICD-10-CM | POA: Diagnosis present

## 2016-07-13 DIAGNOSIS — R440 Auditory hallucinations: Secondary | ICD-10-CM | POA: Diagnosis present

## 2016-07-13 DIAGNOSIS — K219 Gastro-esophageal reflux disease without esophagitis: Secondary | ICD-10-CM | POA: Diagnosis present

## 2016-07-13 DIAGNOSIS — R1013 Epigastric pain: Secondary | ICD-10-CM | POA: Diagnosis present

## 2016-07-13 DIAGNOSIS — H3530 Unspecified macular degeneration: Secondary | ICD-10-CM | POA: Diagnosis present

## 2016-07-13 DIAGNOSIS — G9340 Encephalopathy, unspecified: Secondary | ICD-10-CM | POA: Diagnosis present

## 2016-07-13 DIAGNOSIS — J329 Chronic sinusitis, unspecified: Secondary | ICD-10-CM | POA: Diagnosis present

## 2016-07-13 DIAGNOSIS — N136 Pyonephrosis: Principal | ICD-10-CM | POA: Diagnosis present

## 2016-07-13 DIAGNOSIS — N1330 Unspecified hydronephrosis: Secondary | ICD-10-CM

## 2016-07-13 DIAGNOSIS — Z79899 Other long term (current) drug therapy: Secondary | ICD-10-CM

## 2016-07-13 DIAGNOSIS — Z853 Personal history of malignant neoplasm of breast: Secondary | ICD-10-CM

## 2016-07-13 DIAGNOSIS — I10 Essential (primary) hypertension: Secondary | ICD-10-CM | POA: Diagnosis present

## 2016-07-13 DIAGNOSIS — I69322 Dysarthria following cerebral infarction: Secondary | ICD-10-CM

## 2016-07-13 DIAGNOSIS — R32 Unspecified urinary incontinence: Secondary | ICD-10-CM | POA: Diagnosis present

## 2016-07-13 DIAGNOSIS — D6859 Other primary thrombophilia: Secondary | ICD-10-CM | POA: Diagnosis present

## 2016-07-13 DIAGNOSIS — B370 Candidal stomatitis: Secondary | ICD-10-CM | POA: Diagnosis present

## 2016-07-13 DIAGNOSIS — Z7901 Long term (current) use of anticoagulants: Secondary | ICD-10-CM

## 2016-07-13 DIAGNOSIS — K5900 Constipation, unspecified: Secondary | ICD-10-CM | POA: Diagnosis present

## 2016-07-13 DIAGNOSIS — K5909 Other constipation: Secondary | ICD-10-CM | POA: Diagnosis present

## 2016-07-13 DIAGNOSIS — I69354 Hemiplegia and hemiparesis following cerebral infarction affecting left non-dominant side: Secondary | ICD-10-CM

## 2016-07-13 DIAGNOSIS — Z86718 Personal history of other venous thrombosis and embolism: Secondary | ICD-10-CM

## 2016-07-13 DIAGNOSIS — R441 Visual hallucinations: Secondary | ICD-10-CM | POA: Diagnosis present

## 2016-07-13 HISTORY — DX: Unspecified hydronephrosis: N13.30

## 2016-07-13 HISTORY — DX: Fatty (change of) liver, not elsewhere classified: K76.0

## 2016-07-13 HISTORY — DX: Encephalopathy, unspecified: G93.40

## 2016-07-13 LAB — URINALYSIS, ROUTINE W REFLEX MICROSCOPIC
Bilirubin Urine: NEGATIVE
Glucose, UA: NEGATIVE mg/dL
Ketones, ur: NEGATIVE mg/dL
Nitrite: NEGATIVE
Protein, ur: NEGATIVE mg/dL
SPECIFIC GRAVITY, URINE: 1.009 (ref 1.005–1.030)
TRANS EPITHEL UA: 1
pH: 5 (ref 5.0–8.0)

## 2016-07-13 LAB — CBC WITH DIFFERENTIAL/PLATELET
BASOS ABS: 0 10*3/uL (ref 0.0–0.1)
Basophils Relative: 0 %
Eosinophils Absolute: 0.1 10*3/uL (ref 0.0–0.7)
Eosinophils Relative: 2 %
HEMATOCRIT: 43.9 % (ref 36.0–46.0)
HEMOGLOBIN: 14.4 g/dL (ref 12.0–15.0)
LYMPHS ABS: 1.7 10*3/uL (ref 0.7–4.0)
LYMPHS PCT: 21 %
MCH: 30.9 pg (ref 26.0–34.0)
MCHC: 32.8 g/dL (ref 30.0–36.0)
MCV: 94.2 fL (ref 78.0–100.0)
Monocytes Absolute: 0.7 10*3/uL (ref 0.1–1.0)
Monocytes Relative: 9 %
NEUTROS ABS: 5.7 10*3/uL (ref 1.7–7.7)
NEUTROS PCT: 68 %
Platelets: 213 10*3/uL (ref 150–400)
RBC: 4.66 MIL/uL (ref 3.87–5.11)
RDW: 13.8 % (ref 11.5–15.5)
WBC: 8.2 10*3/uL (ref 4.0–10.5)

## 2016-07-13 LAB — COMPREHENSIVE METABOLIC PANEL
ALK PHOS: 110 U/L (ref 38–126)
ALT: 16 U/L (ref 14–54)
AST: 25 U/L (ref 15–41)
Albumin: 4.2 g/dL (ref 3.5–5.0)
Anion gap: 8 (ref 5–15)
BUN: 10 mg/dL (ref 6–20)
CO2: 23 mmol/L (ref 22–32)
CREATININE: 0.99 mg/dL (ref 0.44–1.00)
Calcium: 9.2 mg/dL (ref 8.9–10.3)
Chloride: 109 mmol/L (ref 101–111)
GFR, EST AFRICAN AMERICAN: 58 mL/min — AB (ref >60)
GFR, EST NON AFRICAN AMERICAN: 50 mL/min — AB (ref >60)
Glucose, Bld: 113 mg/dL — ABNORMAL HIGH (ref 65–99)
Potassium: 3.5 mmol/L (ref 3.5–5.1)
Sodium: 140 mmol/L (ref 135–145)
Total Bilirubin: 0.8 mg/dL (ref 0.3–1.2)
Total Protein: 6.8 g/dL (ref 6.5–8.1)

## 2016-07-13 LAB — ETHANOL

## 2016-07-13 LAB — PHOSPHORUS: PHOSPHORUS: 2.3 mg/dL — AB (ref 2.5–4.6)

## 2016-07-13 LAB — MAGNESIUM: MAGNESIUM: 2.4 mg/dL (ref 1.7–2.4)

## 2016-07-13 LAB — MRSA PCR SCREENING: MRSA BY PCR: NEGATIVE

## 2016-07-13 MED ORDER — ONDANSETRON HCL 4 MG PO TABS: 4.0000 mg | ORAL_TABLET | Freq: Four times a day (QID) | ORAL | Status: DC | PRN

## 2016-07-13 MED ORDER — DEXTROSE 5 % IV SOLN
1.0000 g | INTRAVENOUS | Status: DC
  Administered 2016-07-13 – 2016-07-16 (×4): 1 g via INTRAVENOUS
  Filled 2016-07-13 (×5): qty 10

## 2016-07-13 MED ORDER — ATORVASTATIN CALCIUM 10 MG PO TABS
10.0000 mg | ORAL_TABLET | Freq: Every day | ORAL | Status: DC
  Administered 2016-07-13 – 2016-07-16 (×4): 10 mg via ORAL
  Filled 2016-07-13 (×5): qty 1

## 2016-07-13 MED ORDER — METOPROLOL TARTRATE 12.5 MG HALF TABLET
12.5000 mg | ORAL_TABLET | Freq: Two times a day (BID) | ORAL | Status: DC
  Administered 2016-07-13 – 2016-07-17 (×8): 12.5 mg via ORAL
  Filled 2016-07-13 (×8): qty 1

## 2016-07-13 MED ORDER — FLUTICASONE PROPIONATE 50 MCG/ACT NA SUSP: 1.0000 | NASAL | Status: DC | PRN

## 2016-07-13 MED ORDER — MIRTAZAPINE 7.5 MG PO TABS
7.5000 mg | ORAL_TABLET | Freq: Every day | ORAL | Status: DC
  Administered 2016-07-13 – 2016-07-14 (×2): 7.5 mg via ORAL
  Filled 2016-07-13 (×2): qty 1

## 2016-07-13 MED ORDER — BISACODYL 10 MG RE SUPP: 10.0000 mg | Freq: Every day | RECTAL | Status: DC | PRN

## 2016-07-13 MED ORDER — KETOROLAC TROMETHAMINE 15 MG/ML IJ SOLN
15.0000 mg | Freq: Four times a day (QID) | INTRAMUSCULAR | Status: DC | PRN
  Administered 2016-07-14: 15 mg via INTRAVENOUS
  Filled 2016-07-13: qty 1

## 2016-07-13 MED ORDER — CALCIUM CARBONATE-VITAMIN D 500-200 MG-UNIT PO TABS
1.0000 | ORAL_TABLET | Freq: Two times a day (BID) | ORAL | Status: DC
  Administered 2016-07-13 – 2016-07-17 (×8): 1 via ORAL
  Filled 2016-07-13 (×9): qty 1

## 2016-07-13 MED ORDER — SODIUM CHLORIDE 0.9 % IV SOLN
INTRAVENOUS | Status: AC
  Administered 2016-07-13: 20:00:00 via INTRAVENOUS

## 2016-07-13 MED ORDER — POLYETHYLENE GLYCOL 3350 17 G PO PACK
17.0000 g | PACK | Freq: Every day | ORAL | Status: DC
  Administered 2016-07-14 – 2016-07-16 (×3): 17 g via ORAL
  Filled 2016-07-13 (×3): qty 1

## 2016-07-13 MED ORDER — SENNOSIDES-DOCUSATE SODIUM 8.6-50 MG PO TABS
1.0000 | ORAL_TABLET | Freq: Two times a day (BID) | ORAL | Status: AC
  Administered 2016-07-13 – 2016-07-16 (×6): 1 via ORAL
  Filled 2016-07-13 (×6): qty 1

## 2016-07-13 MED ORDER — RIVAROXABAN 15 MG PO TABS
15.0000 mg | ORAL_TABLET | Freq: Every day | ORAL | Status: DC
  Administered 2016-07-14 – 2016-07-16 (×3): 15 mg via ORAL
  Filled 2016-07-13 (×3): qty 1

## 2016-07-13 MED ORDER — ACETAMINOPHEN 325 MG PO TABS: 650.0000 mg | ORAL_TABLET | ORAL | Status: DC | PRN

## 2016-07-13 MED ORDER — SERTRALINE HCL 25 MG PO TABS
75.0000 mg | ORAL_TABLET | Freq: Every day | ORAL | Status: DC
  Administered 2016-07-14: 17:00:00 75 mg via ORAL
  Filled 2016-07-13 (×2): qty 1

## 2016-07-13 MED ORDER — IOPAMIDOL (ISOVUE-300) INJECTION 61%
INTRAVENOUS | Status: AC
  Administered 2016-07-13: 100 mL
  Filled 2016-07-13: qty 100

## 2016-07-13 MED ORDER — ONDANSETRON HCL 4 MG/2ML IJ SOLN
4.0000 mg | Freq: Four times a day (QID) | INTRAMUSCULAR | Status: DC | PRN
  Filled 2016-07-13: qty 2

## 2016-07-13 MED ORDER — MAGNESIUM HYDROXIDE 400 MG/5ML PO SUSP
30.0000 mL | Freq: Every day | ORAL | Status: AC
  Administered 2016-07-15: 30 mL via ORAL
  Filled 2016-07-13 (×2): qty 30

## 2016-07-13 MED ORDER — PANTOPRAZOLE SODIUM 40 MG PO TBEC
40.0000 mg | DELAYED_RELEASE_TABLET | Freq: Every day | ORAL | Status: DC
  Administered 2016-07-14 – 2016-07-17 (×4): 40 mg via ORAL
  Filled 2016-07-13 (×4): qty 1

## 2016-07-13 NOTE — ED Notes (Signed)
Pt transported to CT ?

## 2016-07-13 NOTE — ED Provider Notes (Signed)
[facility] DEPT Provider Note   CSN: [id number] Arrival date & time: 07/13/16  1155     History   Chief Complaint Chief Complaint  Patient presents with  . Urinary Frequency  . Hallucinations    HPI Erica [name] is a 83 y.o. female. Level 5 caveat due to altered mental status. HPI Patient presents with altered mental status. Has been reportedly hallucinating that someone is coming to kill her. States the patient is in the room right now. Comes from assisted living initially but had been at family's house for last couple days. Has been more incontinent of urine than her baseline. No fevers. Has had decreased oral intake. No reported fall.   Past Medical History:  Diagnosis Date  . Aphasia following unspecified cerebrovascular disease   . 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 Depression   . Dysphagia following cerebrovascular disease   . Hypertension   . Insomnia   . Macular degeneration   . Major depressive disorder, recurrent ([place])   . Neuralgia and neuritis, unspecified ([place])   . Nontraumatic intracranial hemorrhage ([place])   . Stroke ([place])   . 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   . 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    right    Patient Active Problem List   Diagnosis Date Noted  . Dog bite of arm, right, initial encounter 07/07/2016  . Viral upper respiratory illness 03/06/2016  . Hypertension   . Stroke ([place])   . Dysphagia following cerebrovascular disease   . Aphasia following unspecified cerebrovascular disease   . 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 Nontraumatic intracranial hemorrhage ([place])   . 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   . Neuralgia and neuritis, unspecified (CODE)   . Major depressive disorder, recurrent ([place])   . Rash and nonspecific skin eruption 10/31/2015  . UTI (urinary tract infection) 07/26/2015  . Abdominal bloating  04/26/2015  . Constipation 01/29/2015  . RUQ pain 12/14/2014  . Abdominal pain, epigastric 10/04/2014  . TMJ tenderness 04/20/2014  . Mass of left breast on mammogram 12/15/2013  . Sinusitis, chronic 11/30/2013  . Solitary bone cyst of left forearm 11/10/2013  . SCC (squamous cell carcinoma), scalp/neck 11/10/2013  . Shingles 11/24/2012  . GERD (gastroesophageal reflux disease) 09/29/2012  . Protein C deficiency ([place]) 06/14/2012  . Protein S deficiency ([place]) 06/14/2012  . DNR (do not resuscitate) 06/14/2012  . 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 Weight gain 05/10/2012  . Essential hypertension, benign 03/24/2012  . Depression 03/24/2012  . DVT (deep venous thrombosis) ([place]) 03/24/2012  . Dysphagia 03/24/2012  . CVA (cerebral vascular accident) ([place]) 03/24/2012  . Osteoporosis 03/24/2012  . Macular degeneration 03/24/2012  . Aphasia 03/24/2012  . Insomnia 03/24/2012  . Dyslipidemia 03/24/2012    Past Surgical History:  Procedure Laterality Date  . ABDOMINAL HYSTERECTOMY    . APPENDECTOMY    . BREAST SURGERY    . MASTECTOMY Right     OB History    No data available       Home Medications    Prior to Admission medications   Medication Sig Start Date End Date Taking? Authorizing Provider  acetaminophen (TYLENOL) 325 MG tablet Take 650 mg by mouth every 4 (four) hours as needed for pain.    [provider]  atorvastatin (LIPITOR) 10 MG tablet Take 10 mg by mouth daily.    [provider]  Calcium  Carbonate-Vitamin D (CALTRATE 600+D) 600-400 MG-UNIT tablet Take 1 tablet by mouth 2 (two) times daily.    [provider]  fluticasone (FLONASE) 50 MCG/ACT nasal spray Place 1 spray into both nostrils as needed for allergies.     [provider]  hydrocortisone 2.5 % cream Apply 1 application topically 2 (two) times daily. For itchy rash    [provider]  hydrocortisone cream 1 % Apply 1 application topically. Preparation H  apply to hemorrhoids three times a day as needed for hemorrhoids    [provider]  metoprolol tartrate (LOPRESSOR) 12.5 mg TABS tablet Take 12.5 mg by mouth 2 (two) times daily. For HTN    [provider]  mirtazapine (REMERON) 7.5 MG tablet Take 7.5 mg by mouth at bedtime. For depressive disorder    [provider]  Multiple Vitamins-Minerals (I-VITE) TABS Take 1 tablet by mouth daily.    [provider]  nystatin (NYSTATIN) powder Apply topically as needed. Under left breast    [provider]  omeprazole (PRILOSEC) 20 MG capsule Take 20 mg by mouth daily.    [provider]  polyethylene glycol (MIRALAX / GLYCOLAX) packet Take 17 g by mouth daily.    [provider]  Rivaroxaban (XARELTO) 20 MG TABS tablet Take 20 mg by mouth daily. Reported on 04/26/2015    [provider]  sertraline (ZOLOFT) 25 MG tablet Take 75 mg by mouth daily.    [provider]  Simethicone (GAS RELIEF 80 PO) Take by mouth. Take one tablet after meals and at bedtime for gas relief    [provider]  UNABLE TO FIND Take 5 mLs by mouth 4 (four) times daily -  before meals and at bedtime. Med Name: Magic Mouthwash as needed    [provider]    Family History No family history on file.  Social History Social History  Substance Use Topics  . Smoking status: Never Smoker  . Smokeless tobacco: Never Used  . Alcohol use No     Allergies   Hydrocodone; Morphine and related; Actonel [risedronate sodium]; Darvon [propoxyphene hcl]; Oxycodone hcl; and Pneumovax [pneumococcal polysaccharide vaccine]   Review of Systems Review of Systems  Unable to perform ROS: Dementia     Physical Exam Updated Vital Signs BP (!) 147/75   Pulse 78   Temp 97.6 F (36.4 C) (Oral)   Resp 16   Ht 5\' 3"  (1.6 m)   Wt 72.1 kg (159 lb)   SpO2 95%   BMI 28.17 kg/m   Physical Exam  Constitutional: She appears well-developed.    HENT:  Head: Atraumatic.  Neck: Neck supple.  Cardiovascular: Normal rate.   Pulmonary/Chest: Effort normal.  Abdominal: She exhibits distension and mass.  Abdomen is somewhat distended inferiorly with right lower quadrant tenderness and fullness. No hernia palpated.  Musculoskeletal: She exhibits no edema.  Neurological: She is alert.  Patient is awake but states there is some in the room that is circular. She is not able to identify the family member in her room correctly. She appears to moving all her extremities.  Skin: Skin is warm. Capillary refill takes less than 2 seconds.     ED Treatments / Results  Labs (all labs ordered are listed, but only abnormal results are displayed) Labs Reviewed  URINALYSIS, ROUTINE W REFLEX MICROSCOPIC - Abnormal; Notable for the following:       Result Value   APPearance HAZY (*)    Hgb  urine dipstick SMALL (*)    Leukocytes, UA LARGE (*)    Bacteria, UA RARE (*)    Squamous Epithelial / LPF 0-5 (*)    All other components within normal limits  COMPREHENSIVE METABOLIC PANEL - Abnormal; Notable for the following:    Glucose, Bld 113 (*)    GFR calc non Af Amer 50 (*)    GFR calc Af Amer 58 (*)    All other components within normal limits  CBC WITH DIFFERENTIAL/PLATELET  ETHANOL    EKG  EKG Interpretation None       Radiology Dg Chest 2 View  Result Date: 07/13/2016 CLINICAL DATA:  Patient very confused and unable to provide history. Hallucinations and possible UTI. EXAM: CHEST  2 VIEW COMPARISON:  08/20/2014. FINDINGS: Low volume film. Cardiopericardial silhouette is at upper limits of normal for size. There is pulmonary vascular congestion without overt pulmonary edema. Subsegmental atelectasis noted left lung base. No pleural effusion. The visualized bony structures of the thorax are intact. IMPRESSION: Low lung volumes with borderline cardiomegaly and vascular congestion. Electronically Signed   By: Misty Stanley M.D.   On:  07/13/2016 14:54    Procedures Procedures (including critical care time)  Medications Ordered in ED Medications  iopamidol (ISOVUE-300) 61 % injection (100 mLs  Contrast Given 07/13/16 1539)     Initial Impression / Assessment and Plan / ED Course  I have reviewed the triage vital signs and the nursing notes.  Pertinent labs & imaging results that were available during my care of the patient were reviewed by me and considered in my medical decision making (see chart for details).     Patient with mental status changes and abdominal pain. Has had urinary incontinence but you're not clearly infection. Has been hallucinating that there are people in her house and wanted kill her. They are in the ER with her. Labs reassuring. Chest x-ray reassuring. CT of head and abdomen still pending. Care will be turned over to Dr. Regenia Skeeter  Final Clinical Impressions(s) / ED Diagnoses   Final diagnoses:  Hallucinations    New Prescriptions New Prescriptions   No medications on file     Davonna Belling, MD 07/13/16 1549

## 2016-07-13 NOTE — ED Notes (Signed)
One unsuccessful attempt to call report  rn will call me back

## 2016-07-13 NOTE — ED Notes (Signed)
Was told by x-ray transporter that pt was trying to choke herself.  I went to pt's room and her family was standing on each side of the stretcher.  Pt had her hands at her throat and was coughing.  I ask them if they needed help, they advised that this is why she is here.  At that time pt st's she's not choking herself.

## 2016-07-13 NOTE — H&P (Signed)
History and Physical    Erica [name] PYK:[id number] DOB: 04-16-1927 DOA: 07/13/2016  PCP: Estill Dooms, MD Patient [facility] from[facility]  Chief Complaint: acute encephalopathy/hallucinations  HPI: Erica [name] is a 83 y.o. female with medical history significant for hypertension, stroke, dysphasia, depression/anxiety, constipation, GERD presents to the emergency Department chief complaint of a frequency and hallucinations. Initial evaluation reveals urinary tract infection.  Information is obtained from the patient and the daughters who are at the bedside noting that information from patient is unreliable do to acute encephalopathy. [facility] is a [facility] of [facility]. She was at her daughter's house for the last couple of days because patient began to complain that "someone is trying to kill me". Daughter reports this is happened in the past when she's had a urinary tract infection but "not this bad". Family thought that this person the patient was seeing would not be seen by the patient at their house but this was not the case. There is been no report of fever chills nausea vomiting. Associated symptoms do include urinary incontinence which is unusual. No complaints of dysuria no complaints of headache fever chills chest pain palpitations cough lower extremity edema. As reported patient has been eating and drinking her normal amount and has not had any unintentional weight loss. Daughters report her baseline is demented. She cannot always make her wants and needs known she is usually continent of urine and stool. She can feed herself and presented with food. She does need prompting and assistance with ADLs.    ED Course: In the emergency department she's afebrile hemodynamically stable and not hypoxic.  Review of Systems: As per HPI otherwise all other systems reviewed and are negative.   Ambulatory Status: She ambulates with a rolling walker no  recent falls minimal assist with ADLs  Past Medical History:  Diagnosis Date  . Acute encephalopathy   . Aphasia following unspecified cerebrovascular disease   . 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 Depression   . Dysphagia following cerebrovascular disease   . Hepatic steatosis   . Hydroureteronephrosis   . Hypertension   . Insomnia   . Macular degeneration   . Major depressive disorder, recurrent ([place])   . Neuralgia and neuritis, unspecified (CODE)   . Nontraumatic intracranial hemorrhage ([place])   . Stroke ([place])   . 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   . 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 ABDOMINAL HYSTERECTOMY    . APPENDECTOMY    . BREAST SURGERY    . MASTECTOMY Right     Social History   Social History  . Marital status: Widowed    Spouse name: N/A  . Number of children: N/A  . Years of education: N/A   Occupational History  . Not on file.   Social History Main Topics  . Smoking status: Never Smoker  . Smokeless tobacco: Never Used  . Alcohol use No  . Drug use: No  . Sexual activity: No   Other Topics Concern  . Not on file   Social History Narrative   Lives at [facility], [place] since 04/25/2013   Widowed   Never smoked   Alcohol none   DNR, POA, Living Will    Allergies  Allergen Reactions  . Hydrocodone Nausea And Vomiting  . Morphine And Related Nausea  And Vomiting  . Actonel [Risedronate Sodium] Other (See Comments)    unknown  . Darvon [Propoxyphene Hcl] Other (See Comments)    unknown  . Oxycodone Hcl Other (See Comments)    unknown  . Pneumovax [Pneumococcal Polysaccharide Vaccine] Other (See Comments)    unknown    No family history on file.  Prior to Admission medications   Medication Sig Start Date End Date Taking? Authorizing Provider  acetaminophen (TYLENOL) 325 MG tablet Take 650 mg  by mouth every 4 (four) hours as needed for pain.   Yes [provider]  atorvastatin (LIPITOR) 10 MG tablet Take 10 mg by mouth daily.    [provider]  Calcium Carbonate-Vitamin D (CALTRATE 600+D) 600-400 MG-UNIT tablet Take 1 tablet by mouth 2 (two) times daily.    [provider]  fluticasone (FLONASE) 50 MCG/ACT nasal spray Place 1 spray into both nostrils as needed for allergies.     [provider]  hydrocortisone 2.5 % cream Apply 1 application topically 2 (two) times daily. For itchy rash    [provider]  hydrocortisone cream 1 % Apply 1 application topically. Preparation H apply to hemorrhoids three times a day as needed for hemorrhoids    [provider]  metoprolol tartrate (LOPRESSOR) 12.5 mg TABS tablet Take 12.5 mg by mouth 2 (two) times daily. For HTN    [provider]  mirtazapine (REMERON) 7.5 MG tablet Take 7.5 mg by mouth at bedtime. For depressive disorder    [provider]  Multiple Vitamins-Minerals (I-VITE) TABS Take 1 tablet by mouth daily.    [provider]  nystatin (NYSTATIN) powder Apply topically as needed. Under left breast    [provider]  omeprazole (PRILOSEC) 20 MG capsule Take 20 mg by mouth daily.    [provider]  polyethylene glycol (MIRALAX / GLYCOLAX) packet Take 17 g by mouth daily.    [provider]  Rivaroxaban (XARELTO) 20 MG TABS tablet Take 20 mg by mouth daily. Reported on 04/26/2015    [provider]  sertraline (ZOLOFT) 25 MG tablet Take 75 mg by mouth daily.    [provider]  Simethicone (GAS RELIEF 80 PO) Take by mouth. Take one tablet after meals and at bedtime for gas relief    [provider]  UNABLE TO FIND Take 5 mLs by mouth 4 (four) times daily -  before meals and at bedtime. Med Name: Magic Mouthwash as needed    [provider]    Physical Exam: Vitals:   07/13/16 1345 07/13/16  1400 07/13/16 1500 07/13/16 1530  BP: 135/80 (!) 143/85 (!) 145/65 (!) 147/75  Pulse: (!) 44 86 80 78  Resp:      Temp:      TempSrc:      SpO2: 96% 95% 95% 95%  Weight:      Height:         General:  Appears Well nourished quite anxious hallucinating at frequent intervals but in no acute distress Eyes:  PERRL, EOMI, normal lids, iris ENT:  grossly normal hearing, lips & tongue, mucous membranes of her mouth are pink but somewhat dry Neck:  no LAD, masses or thyromegaly Cardiovascular:  RRR, no m/r/g. No LE edema.  Respiratory:  CTA bilaterally, no w/r/r. Normal respiratory effort. Abdomen:  soft, ntnd, slightly distended mild diffuse tenderness no guarding or rebounding Skin:  no rash or induration seen on limited exam Musculoskeletal:  grossly normal tone BUE/BLE,  good ROM, no bony abnormality Psychiatric: Quite anxious. Reports seeing a lady in the corner of the room. Holding her hands around her neck and her jaw to protect her throat from the lady across the room who wants to choker Neurologic:  Alert oriented to self only quite anxious follows commands intermittently. Moving all extremities spontaneously speech clear facial symmetry  Labs on Admission: I have personally reviewed following labs and imaging studies  CBC:  Recent Labs Lab 07/13/16 1420  WBC 8.2  NEUTROABS 5.7  HGB 14.4  HCT 43.9  MCV 94.2  PLT 017   Basic Metabolic Panel:  Recent Labs Lab 07/13/16 1420  NA 140  K 3.5  CL 109  CO2 23  GLUCOSE 113*  BUN 10  CREATININE 0.99  CALCIUM 9.2   GFR: Estimated Creatinine Clearance: 38.8 mL/min (by C-G formula based on SCr of 0.99 mg/dL). Liver Function Tests:  Recent Labs Lab 07/13/16 1420  AST 25  ALT 16  ALKPHOS 110  BILITOT 0.8  PROT 6.8  ALBUMIN 4.2   No results for input(s): LIPASE, AMYLASE in the last 168 hours. No results for input(s): AMMONIA in the last 168 hours. Coagulation Profile: No results for input(s): INR, PROTIME in the  last 168 hours. Cardiac Enzymes: No results for input(s): CKTOTAL, CKMB, CKMBINDEX, TROPONINI in the last 168 hours. BNP (last 3 results) No results for input(s): PROBNP in the last 8760 hours. HbA1C: No results for input(s): HGBA1C in the last 72 hours. CBG: No results for input(s): GLUCAP in the last 168 hours. Lipid Profile: No results for input(s): CHOL, HDL, LDLCALC, TRIG, CHOLHDL, LDLDIRECT in the last 72 hours. Thyroid Function Tests: No results for input(s): TSH, T4TOTAL, FREET4, T3FREE, THYROIDAB in the last 72 hours. Anemia Panel: No results for input(s): VITAMINB12, FOLATE, FERRITIN, TIBC, IRON, RETICCTPCT in the last 72 hours. Urine analysis:    Component Value Date/Time   COLORURINE YELLOW 07/13/2016 1340   APPEARANCEUR HAZY (A) 07/13/2016 1340   LABSPEC 1.009 07/13/2016 1340   PHURINE 5.0 07/13/2016 1340   GLUCOSEU NEGATIVE 07/13/2016 1340   HGBUR SMALL (A) 07/13/2016 1340   BILIRUBINUR NEGATIVE 07/13/2016 1340   KETONESUR NEGATIVE 07/13/2016 1340   PROTEINUR NEGATIVE 07/13/2016 1340   NITRITE NEGATIVE 07/13/2016 1340   LEUKOCYTESUR LARGE (A) 07/13/2016 1340    Creatinine Clearance: Estimated Creatinine Clearance: 38.8 mL/min (by C-G formula based on SCr of 0.99 mg/dL).  Sepsis Labs: @LABRCNTIP (procalcitonin:4,lacticidven:4) )No results found for this or any previous visit (from the past 240 hour(s)).   Radiological Exams on Admission: Dg Chest 2 View  Result Date: 07/13/2016 CLINICAL DATA:  Patient very confused and unable to provide history. Hallucinations and possible UTI. EXAM: CHEST  2 VIEW COMPARISON:  08/20/2014. FINDINGS: Low volume film. Cardiopericardial silhouette is at upper limits of normal for size. There is pulmonary vascular congestion without overt pulmonary edema. Subsegmental atelectasis noted left lung base. No pleural effusion. The visualized bony structures of the thorax are intact. IMPRESSION: Low lung volumes with borderline cardiomegaly  and vascular congestion. Electronically Signed   By: Misty Stanley M.D.   On: 07/13/2016 14:54   Ct Head Wo Contrast  Result Date: 07/13/2016 CLINICAL DATA:  Hallucination, confusion. Possible urinary tract infection. History of hypertension and stroke. EXAM: CT HEAD WITHOUT CONTRAST TECHNIQUE: Contiguous axial images were obtained from the base of the skull through the vertex without intravenous contrast. COMPARISON:  None. FINDINGS: BRAIN: No intraparenchymal hemorrhage, mass effect nor midline shift. LEFT temporoparietal and occipital lobe  encephalomalacia with ex vacuo dilatation LEFT atrium and occipital horn, no hydrocephalus. Patchy supratentorial white matter hypodensities exclusive of the aforementioned abnormality compatible with mild chronic small vessel ischemic disease. No acute large vascular territory infarct. Similar prominent RIGHT supratentorial cerebral spinal fluid space suggesting old hygroma without mass effect, stable from prior imaging. Basal cisterns are patent. VASCULAR: Mild calcific atherosclerosis of the carotid siphons. SKULL: No skull fracture. No significant scalp soft tissue swelling. SINUSES/ORBITS: The mastoid air-cells and included paranasal sinuses are well-aerated.The included ocular globes and orbital contents are non-suspicious. Status post bilateral ocular lens implants OTHER: None. IMPRESSION: 1. No acute intracranial process. 2. Stable examination including old LEFT PCA infarct and LEFT posterior watershed versus LEFT MCA territory infarcts. Electronically Signed   By: Elon Alas M.D.   On: 07/13/2016 16:03   Ct Abdomen Pelvis W Contrast  Result Date: 07/13/2016 CLINICAL DATA:  Possible urinary tract infection. Right lower quadrant pain. Confusion. EXAM: CT ABDOMEN AND PELVIS WITH CONTRAST TECHNIQUE: Multidetector CT imaging of the abdomen and pelvis was performed using the standard protocol following bolus administration of intravenous contrast. CONTRAST:   136mL ISOVUE-300 IOPAMIDOL (ISOVUE-300) INJECTION 61% COMPARISON:  None. FINDINGS: Lower chest: Bibasilar atelectasis. Cardiomegaly, accentuated by a pectus excavatum deformity. Tiny hiatal hernia. Hepatobiliary: Mild degradation secondary to patient arm position, not raised above the head. Mild hepatomegaly at 18.5 cm craniocaudal. Suspect mild hepatic steatosis. Too small to characterize right hepatic lobe lesion. Normal gallbladder, without biliary ductal dilatation. Pancreas: Normal, without mass or ductal dilatation. Spleen: Normal in size, without focal abnormality. Adrenals/Urinary Tract: Normal adrenal glands. At least partially duplicated left renal collecting system. Mild right-sided hydroureteronephrosis. This continues to the level of the pelvic brim. A calcification in this region measures 4 mm, including on image 75/ series 7. Difficult to differentiate if this is vascular or within the ureter. The distal ureter is normal in caliber. Normal urinary bladder. Stomach/Bowel: Normal remainder of the stomach. Scattered colonic diverticula. Colonic stool burden suggests constipation. Normal terminal ileum. Appendectomy. Normal small bowel. Vascular/Lymphatic: Aortic and branch vessel atherosclerosis. No abdominopelvic adenopathy. Reproductive: Hysterectomy.  No adnexal mass. Other: No significant free fluid. Musculoskeletal: Lumbosacral spondylosis with disc bulge at L4-5. IMPRESSION: 1. Mild right-sided hydroureteronephrosis. This continues to the level of a calcification which is either within or adjacent (vascular) to the mid to distal right ureter. Given size transition in this area, favored to represent a ureteric stone. 2.  Possible constipation. 3.  Aortic Atherosclerosis (ICD10-I70.0). 4. Hepatomegaly and probable mild hepatic steatosis. Electronically Signed   By: Abigail Miyamoto M.D.   On: 07/13/2016 16:27    EKG:   Assessment/Plan Principal Problem:   Acute encephalopathy Active Problems:    DVT (deep venous thrombosis) (HCC)   Protein C deficiency (HCC)   GERD (gastroesophageal reflux disease)   Sinusitis, chronic   Abdominal pain, epigastric   Constipation   Abdominal bloating   UTI (urinary tract infection)   Hypertension   Stroke ([place])   #1. Acute encephalopathy. Likely related to infectious process. CT of her head without acute abnormality. CT of the abdomen with mild right hydroureteronephrosis, obstipation. Exam without focal deficits. She is afebrile hemodynamically stable no leukocytosis. Urinalysis concerning for urinary tract infection. -Admit to medical bed -IV fluids -Follow urine culture -Rocephin -Obtain B-12 and folate -Monitor  #2. Urinary tract infection. Urinalysis concerning for UTI. Patient has been experiencing urinary incontinence. -Gentle IV fluids -Rocephin -Follow urine culture  #3. Abdominal bloating/pain/constipation. CT of the abdomen as noted  above. Family unsure of last bowel movement. -Fluids -Milk of magnesia -Colace -Monitor intake and output  #4. Hypertension. Fair control in the emergency department. Home medications include Lopressor -Continue Lopressor -Monitor  #5. History of stroke and DVT. Some mild left-sided weakness and dysarthria. Patient on Xarelto -Physical therapy -Continue home meds     DVT prophylaxis: xaerlto  Code Status: dnr  Family Communication: daughters at bedside  Disposition Plan: back to facility  Consults called: none  Admission status: obs   Radene Gunning MD [facility]  If 7PM-7AM, please contact night-coverage [email or web address] Password [email or web address]  07/13/2016, 5:19 PM

## 2016-07-13 NOTE — ED Notes (Signed)
Pt placed on bedpan

## 2016-07-13 NOTE — ED Notes (Signed)
The pt keeps saying that there is someone in her room that is trying to choke her  She keeps holding her throat like she trying toi ward off an attack  She has daughters at the bedside that attempt to reassure her but she does not believe them

## 2016-07-13 NOTE — ED Triage Notes (Signed)
Pt brought in by daughter for eval of hallucinations and possible UTI. Pt lives in assisted living facility, since Friday pt has told family that someone is trying to kill her. Family attempted to bring pt home for couple of days and the pt reports that the same person at the facility is at the home and wants to kill the daughter too. Daughter has noticed that pt is more incontinent than usual.

## 2016-07-14 DIAGNOSIS — D6859 Other primary thrombophilia: Secondary | ICD-10-CM | POA: Diagnosis present

## 2016-07-14 DIAGNOSIS — Z79899 Other long term (current) drug therapy: Secondary | ICD-10-CM | POA: Diagnosis not present

## 2016-07-14 DIAGNOSIS — R443 Hallucinations, unspecified: Secondary | ICD-10-CM | POA: Diagnosis not present

## 2016-07-14 DIAGNOSIS — Z853 Personal history of malignant neoplasm of breast: Secondary | ICD-10-CM | POA: Diagnosis not present

## 2016-07-14 DIAGNOSIS — K5900 Constipation, unspecified: Secondary | ICD-10-CM

## 2016-07-14 DIAGNOSIS — F333 Major depressive disorder, recurrent, severe with psychotic symptoms: Secondary | ICD-10-CM | POA: Diagnosis present

## 2016-07-14 DIAGNOSIS — B370 Candidal stomatitis: Secondary | ICD-10-CM | POA: Diagnosis present

## 2016-07-14 DIAGNOSIS — N390 Urinary tract infection, site not specified: Secondary | ICD-10-CM | POA: Diagnosis not present

## 2016-07-14 DIAGNOSIS — R441 Visual hallucinations: Secondary | ICD-10-CM | POA: Diagnosis present

## 2016-07-14 DIAGNOSIS — R32 Unspecified urinary incontinence: Secondary | ICD-10-CM | POA: Diagnosis present

## 2016-07-14 DIAGNOSIS — Z87440 Personal history of urinary (tract) infections: Secondary | ICD-10-CM | POA: Diagnosis not present

## 2016-07-14 DIAGNOSIS — W540XXA Bitten by dog, initial encounter: Secondary | ICD-10-CM | POA: Diagnosis not present

## 2016-07-14 DIAGNOSIS — G9340 Encephalopathy, unspecified: Secondary | ICD-10-CM | POA: Diagnosis present

## 2016-07-14 DIAGNOSIS — Z66 Do not resuscitate: Secondary | ICD-10-CM | POA: Diagnosis present

## 2016-07-14 DIAGNOSIS — H3530 Unspecified macular degeneration: Secondary | ICD-10-CM | POA: Diagnosis present

## 2016-07-14 DIAGNOSIS — I10 Essential (primary) hypertension: Secondary | ICD-10-CM

## 2016-07-14 DIAGNOSIS — F28 Other psychotic disorder not due to a substance or known physiological condition: Secondary | ICD-10-CM | POA: Diagnosis not present

## 2016-07-14 DIAGNOSIS — Z7901 Long term (current) use of anticoagulants: Secondary | ICD-10-CM | POA: Diagnosis not present

## 2016-07-14 DIAGNOSIS — N1330 Unspecified hydronephrosis: Secondary | ICD-10-CM | POA: Diagnosis not present

## 2016-07-14 DIAGNOSIS — E876 Hypokalemia: Secondary | ICD-10-CM | POA: Diagnosis not present

## 2016-07-14 DIAGNOSIS — Z86718 Personal history of other venous thrombosis and embolism: Secondary | ICD-10-CM | POA: Diagnosis not present

## 2016-07-14 DIAGNOSIS — S41151A Open bite of right upper arm, initial encounter: Secondary | ICD-10-CM | POA: Diagnosis not present

## 2016-07-14 DIAGNOSIS — I69354 Hemiplegia and hemiparesis following cerebral infarction affecting left non-dominant side: Secondary | ICD-10-CM | POA: Diagnosis not present

## 2016-07-14 DIAGNOSIS — F0390 Unspecified dementia without behavioral disturbance: Secondary | ICD-10-CM | POA: Diagnosis present

## 2016-07-14 DIAGNOSIS — K219 Gastro-esophageal reflux disease without esophagitis: Secondary | ICD-10-CM | POA: Diagnosis present

## 2016-07-14 DIAGNOSIS — J329 Chronic sinusitis, unspecified: Secondary | ICD-10-CM | POA: Diagnosis present

## 2016-07-14 DIAGNOSIS — N136 Pyonephrosis: Secondary | ICD-10-CM | POA: Diagnosis present

## 2016-07-14 DIAGNOSIS — R440 Auditory hallucinations: Secondary | ICD-10-CM | POA: Diagnosis present

## 2016-07-14 DIAGNOSIS — I69322 Dysarthria following cerebral infarction: Secondary | ICD-10-CM | POA: Diagnosis not present

## 2016-07-14 LAB — BASIC METABOLIC PANEL
ANION GAP: 8 (ref 5–15)
BUN: 7 mg/dL (ref 6–20)
CALCIUM: 8.8 mg/dL — AB (ref 8.9–10.3)
CO2: 23 mmol/L (ref 22–32)
Chloride: 111 mmol/L (ref 101–111)
Creatinine, Ser: 1.01 mg/dL — ABNORMAL HIGH (ref 0.44–1.00)
GFR, EST AFRICAN AMERICAN: 57 mL/min — AB (ref >60)
GFR, EST NON AFRICAN AMERICAN: 49 mL/min — AB (ref >60)
GLUCOSE: 94 mg/dL (ref 65–99)
Potassium: 3.7 mmol/L (ref 3.5–5.1)
SODIUM: 142 mmol/L (ref 135–145)

## 2016-07-14 LAB — CBC
HCT: 41.3 % (ref 36.0–46.0)
HEMOGLOBIN: 13.2 g/dL (ref 12.0–15.0)
MCH: 31.3 pg (ref 26.0–34.0)
MCHC: 32 g/dL (ref 30.0–36.0)
MCV: 97.9 fL (ref 78.0–100.0)
Platelets: 210 10*3/uL (ref 150–400)
RBC: 4.22 MIL/uL (ref 3.87–5.11)
RDW: 14.4 % (ref 11.5–15.5)
WBC: 5.8 10*3/uL (ref 4.0–10.5)

## 2016-07-14 LAB — VITAMIN B12: VITAMIN B 12: 192 pg/mL (ref 180–914)

## 2016-07-14 NOTE — Progress Notes (Signed)
Attempted to put pt on a low bed d/t being high risk for falls, pt refused and became combative to staff. Will try again later.

## 2016-07-14 NOTE — NC FL2 (Signed)
[place] MEDICAID FL2 LEVEL OF CARE SCREENING TOOL     IDENTIFICATION  Patient Name: Erica [name] Birthdate: [birth year] Sex: female Admission Date (Current Location): 07/13/2016  [facility] [place] Number:  Herbalist and Address:  [facility] [place] [address], [place], [place] [address]      Provider Number: [id number]  Attending Physician Name and Address:  Geradine Girt, DO  Relative Name and Phone Number:  Dawnmarie, [place] - Daughter;  [phone] (h), [phone] (work), [phone] (mobile)     Current Level of Care: Hospital Recommended Level of Care:  SNF Prior Approval Number:    Date Approved/Denied:   PASRR Number: [id number]   (Eff. 12/05/11)  Discharge Plan:  SNF    Current Diagnoses: Patient Active Problem List   Diagnosis Date Noted  . Acute encephalopathy 07/13/2016  . Hydroureteronephrosis   . Dog bite of arm, right, initial encounter 07/07/2016  . Viral upper respiratory illness 03/06/2016  . Hypertension   . Stroke ([place])   . Dysphagia following cerebrovascular disease   . Aphasia following unspecified cerebrovascular disease   . 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 Nontraumatic intracranial hemorrhage ([place])   . 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   . Neuralgia and neuritis, unspecified (CODE)   . Major depressive disorder, recurrent ([place])   . Rash and nonspecific skin eruption 10/31/2015  . UTI (urinary tract infection) 07/26/2015  . Abdominal bloating 04/26/2015  . Constipation 01/29/2015  . RUQ pain 12/14/2014  . Abdominal pain, epigastric 10/04/2014  . TMJ tenderness 04/20/2014  . Mass of left breast on mammogram 12/15/2013  . Sinusitis, chronic 11/30/2013  . Solitary bone cyst of left forearm 11/10/2013  . SCC (squamous cell carcinoma), scalp/neck 11/10/2013  . Shingles 11/24/2012  . GERD (gastroesophageal reflux disease) 09/29/2012  . Protein C deficiency  ([place]) 06/14/2012  . Protein S deficiency ([place]) 06/14/2012  . DNR (do not resuscitate) 06/14/2012  . 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 Weight gain 05/10/2012  . Essential hypertension, benign 03/24/2012  . Depression 03/24/2012  . DVT (deep venous thrombosis) ([place]) 03/24/2012  . Dysphagia 03/24/2012  . CVA (cerebral vascular accident) ([place]) 03/24/2012  . Osteoporosis 03/24/2012  . Macular degeneration 03/24/2012  . Aphasia 03/24/2012  . Insomnia 03/24/2012  . Dyslipidemia 03/24/2012    Orientation RESPIRATION BLADDER Height & Weight     Self  Normal Continent Weight: 151 lb 12.8 oz (68.9 kg) Height:  5\' 3"  (160 cm)  BEHAVIORAL SYMPTOMS/MOOD NEUROLOGICAL BOWEL NUTRITION STATUS      Continent Diet (Heart healthy)  AMBULATORY STATUS COMMUNICATION OF NEEDS Skin    Min. assist Verbally Normal                       Personal Care Assistance Level of Assistance  Bathing, Feeding, Dressing  Bathing - Min assist   Feeding - Independent  Dressing - Min assist   Functional Limitations Info  Sight, Hearing, Speech Sight Info: Adequate Hearing Info: Adequate Speech Info: Adequate    SPECIAL CARE FACTORS FREQUENCY  PT (By licensed PT)  Evaluation on 7/10 and a minimum of 3X per week therapy recommended                  Contractures Contractures Info: Not present    Additional Factors Info  Code Status, Allergies Code Status Info: DNR Allergies Info: Hydrocodone, Morphine and related, Actonel, Darvon, Oxycodone Hcl, Pneumovax  Current Medications (07/14/2016):  This is the current hospital active medication list Current Facility-Administered Medications  Medication Dose Route Frequency Provider Last Rate Last Dose  . acetaminophen (TYLENOL) tablet 650 mg  650 mg Oral Q4H PRN Radene Gunning, NP      . atorvastatin (LIPITOR) tablet 10 mg  10 mg Oral Q2000 Radene Gunning, NP   10 mg at 07/13/16 2049  . bisacodyl (DULCOLAX) suppository 10 mg  10 mg  Rectal Daily PRN Black, Lezlie Octave, NP      . calcium-vitamin D (OSCAL WITH D) 500-200 MG-UNIT per tablet 1 tablet  1 tablet Oral BID Radene Gunning, NP   1 tablet at 07/14/16 1017  . cefTRIAXone (ROCEPHIN) 1 g in dextrose 5 % 50 mL IVPB  1 g Intravenous Q24H Radene Gunning, NP   Stopped at 07/13/16 1757  . fluticasone (FLONASE) 50 MCG/ACT nasal spray 1 spray  1 spray Each Nare PRN Black, Lezlie Octave, NP      . ketorolac (TORADOL) 15 MG/ML injection 15 mg  15 mg Intravenous Q6H PRN Black, Karen M, NP      . magnesium hydroxide (MILK OF MAGNESIA) suspension 30 mL  30 mL Oral Daily Black, Lezlie Octave, NP      . metoprolol tartrate (LOPRESSOR) tablet 12.5 mg  12.5 mg Oral BID Dyanne Carrel M, NP   12.5 mg at 07/14/16 1017  . mirtazapine (REMERON) tablet 7.5 mg  7.5 mg Oral QHS Radene Gunning, NP   7.5 mg at 07/13/16 2313  . ondansetron (ZOFRAN) tablet 4 mg  4 mg Oral Q6H PRN Radene Gunning, NP       Or  . ondansetron [facility]) injection 4 mg  4 mg Intravenous Q6H PRN Black, Karen M, NP      . pantoprazole (PROTONIX) EC tablet 40 mg  40 mg Oral Daily Radene Gunning, NP   40 mg at 07/14/16 1017  . polyethylene glycol (MIRALAX / GLYCOLAX) packet 17 g  17 g Oral Daily Radene Gunning, NP   17 g at 07/14/16 1017  . Rivaroxaban (XARELTO) tablet 15 mg  15 mg Oral q1800 Radene Gunning, NP      . senna-docusate (Senokot-S) tablet 1 tablet  1 tablet Oral BID Radene Gunning, NP   1 tablet at 07/14/16 1017  . sertraline (ZOLOFT) tablet 75 mg  75 mg Oral q1800 Radene Gunning, NP         Discharge Medications: Please see discharge summary for a list of discharge medications.  Relevant Imaging Results:  Relevant Lab Results:   Additional Information ss#[id number]  Sable Feil, LCSW

## 2016-07-14 NOTE — Care Management Obs Status (Signed)
[place] NOTIFICATION   Patient Details  Name: Erica [name] MRN: [id number] Date of Birth: 08-Jan-1927   Medicare Observation Status Notification Given:  Yes    [name], [name], RN 07/14/2016, 10:11 AM

## 2016-07-14 NOTE — Progress Notes (Signed)
PROGRESS NOTE    Erica [name]  QPY:[id number] DOB: [birth year] DOA: 07/13/2016 PCP: Estill Dooms, MD   Outpatient Specialists:     Brief Narrative:  Erica [name] is a 83 y.o. female with a Past Medical History significant for hypertension, stroke, constipation who presents with acute encephalopathy.   Assessment & Plan:   Principal Problem:   Acute encephalopathy Active Problems:   DVT (deep venous thrombosis) (HCC)   Protein C deficiency (HCC)   GERD (gastroesophageal reflux disease)   Sinusitis, chronic   Abdominal pain, epigastric   Constipation   Abdominal bloating   UTI (urinary tract infection)   Hypertension   Stroke ([place])   Acute encephalopathy. Likely related to infectious process. -CT of the abdomen with mild right hydroureteronephrosis, obstipation. -IV fluids -Follow urine culture once sent -Rocephin -Obtain B-12 and folate  Urinary tract infection -Rocephin -Follow urine culture (has not been sent as of this AM so nor sure how helpful it will be)  Abdominal bloating/pain due to constipation. -Milk of magnesia -Colace -senna -suppository if needed   Hypertension -Continue Lopressor -Monitor  History of stroke and DVT with protein c def. Some mild left-sided weakness and dysarthria. Patient on Xarelto -Physical therapy -Continue home meds    DVT prophylaxis:  Fully anticoagulated- xarelto   Code Status: DNR   Family Communication: Daughter on phone  Disposition Plan:     Consultants:    Subjective: C/o abd pain  Objective: Vitals:   07/13/16 1817 07/13/16 2053 07/14/16 0429 07/14/16 0941  BP: (!) 195/84 133/62 (!) 132/55 135/68  Pulse: 93 69 66 70  Resp: 20 19 18 18   Temp: 98.2 F (36.8 C) (!) 97.5 F (36.4 C) 98.6 F (37 C) 98 F (36.7 C)  TempSrc: Oral Oral Oral Oral  SpO2: 96% 96% 93% 99%  Weight: 68.9 kg (151 lb 12.8 oz) 68.9 kg (151 lb 12.8 oz)    Height: 5\' 3"  (1.6 m)       Intake/Output Summary  (Last 24 hours) at 07/14/16 1129 Last data filed at 07/14/16 0700  Gross per 24 hour  Intake              660 ml  Output                0 ml  Net              660 ml   Filed Weights   07/13/16 1213 07/13/16 1817 07/13/16 2053  Weight: 72.1 kg (159 lb) 68.9 kg (151 lb 12.8 oz) 68.9 kg (151 lb 12.8 oz)    Examination:  General exam: Appears calm and comfortable  Respiratory system: Clear to auscultation. Respiratory effort normal. Cardiovascular system: S1 & S2 heard, RRR. No JVD, murmurs, rubs, gallops or clicks. No pedal edema. Gastrointestinal system: Abdomen is nondistended, soft and nontender. No organomegaly or masses felt. Normal bowel sounds heard. Central nervous system: Alert and oriented. No focal neurological deficits. Extremities: Symmetric 5 x 5 power. Skin: No rashes, lesions or ulcers Psychiatry: Judgement and insight appear normal. Mood & affect appropriate.     Data Reviewed: I have personally reviewed following labs and imaging studies  CBC:  Recent Labs Lab 07/13/16 1420 07/14/16 0638  WBC 8.2 5.8  NEUTROABS 5.7  --   HGB 14.4 13.2  HCT 43.9 41.3  MCV 94.2 97.9  PLT 213 124   Basic Metabolic Panel:  Recent Labs Lab 07/13/16 1420 07/14/16 0638  NA 140 142  K 3.5  3.7  CL 109 111  CO2 23 23  GLUCOSE 113* 94  BUN 10 7  CREATININE 0.99 1.01*  CALCIUM 9.2 8.8*  MG 2.4  --   PHOS 2.3*  --    GFR: Estimated Creatinine Clearance: 37.2 mL/min (A) (by C-G formula based on SCr of 1.01 mg/dL (H)). Liver Function Tests:  Recent Labs Lab 07/13/16 1420  AST 25  ALT 16  ALKPHOS 110  BILITOT 0.8  PROT 6.8  ALBUMIN 4.2   No results for input(s): LIPASE, AMYLASE in the last 168 hours. No results for input(s): AMMONIA in the last 168 hours. Coagulation Profile: No results for input(s): INR, PROTIME in the last 168 hours. Cardiac Enzymes: No results for input(s): CKTOTAL, CKMB, CKMBINDEX, TROPONINI in the last 168 hours. BNP (last 3  results) No results for input(s): PROBNP in the last 8760 hours. HbA1C: No results for input(s): HGBA1C in the last 72 hours. CBG: No results for input(s): GLUCAP in the last 168 hours. Lipid Profile: No results for input(s): CHOL, HDL, LDLCALC, TRIG, CHOLHDL, LDLDIRECT in the last 72 hours. Thyroid Function Tests: No results for input(s): TSH, T4TOTAL, FREET4, T3FREE, THYROIDAB in the last 72 hours. Anemia Panel: No results for input(s): VITAMINB12, FOLATE, FERRITIN, TIBC, IRON, RETICCTPCT in the last 72 hours. Urine analysis:    Component Value Date/Time   COLORURINE YELLOW 07/13/2016 1340   APPEARANCEUR HAZY (A) 07/13/2016 1340   LABSPEC 1.009 07/13/2016 1340   PHURINE 5.0 07/13/2016 1340   GLUCOSEU NEGATIVE 07/13/2016 1340   HGBUR SMALL (A) 07/13/2016 1340   BILIRUBINUR NEGATIVE 07/13/2016 1340   KETONESUR NEGATIVE 07/13/2016 1340   PROTEINUR NEGATIVE 07/13/2016 1340   NITRITE NEGATIVE 07/13/2016 1340   LEUKOCYTESUR LARGE (A) 07/13/2016 1340     ) Recent Results (from the past 240 hour(s))  MRSA PCR Screening     Status: None   Collection Time: 07/13/16  9:13 PM  Result Value Ref Range Status   MRSA by PCR NEGATIVE NEGATIVE Final    Comment:        The GeneXpert MRSA Assay (FDA approved for NASAL specimens only), is one component of a comprehensive MRSA colonization surveillance program. It is not intended to diagnose MRSA infection nor to guide or monitor treatment for MRSA infections.       Anti-infectives    Start     Dose/Rate Route Frequency Ordered Stop   07/13/16 1700  cefTRIAXone (ROCEPHIN) 1 g in dextrose 5 % 50 mL IVPB     1 g 100 mL/hr over 30 Minutes Intravenous Every 24 hours 07/13/16 1638         Radiology Studies: Dg Chest 2 View  Result Date: 07/13/2016 CLINICAL DATA:  Patient very confused and unable to provide history. Hallucinations and possible UTI. EXAM: CHEST  2 VIEW COMPARISON:  08/20/2014. FINDINGS: Low volume film.  Cardiopericardial silhouette is at upper limits of normal for size. There is pulmonary vascular congestion without overt pulmonary edema. Subsegmental atelectasis noted left lung base. No pleural effusion. The visualized bony structures of the thorax are intact. IMPRESSION: Low lung volumes with borderline cardiomegaly and vascular congestion. Electronically Signed   By: Misty Stanley M.D.   On: 07/13/2016 14:54   Ct Head Wo Contrast  Result Date: 07/13/2016 CLINICAL DATA:  Hallucination, confusion. Possible urinary tract infection. History of hypertension and stroke. EXAM: CT HEAD WITHOUT CONTRAST TECHNIQUE: Contiguous axial images were obtained from the base of the skull through the vertex without intravenous contrast. COMPARISON:  None. FINDINGS: BRAIN: No intraparenchymal hemorrhage, mass effect nor midline shift. LEFT temporoparietal and occipital lobe encephalomalacia with ex vacuo dilatation LEFT atrium and occipital horn, no hydrocephalus. Patchy supratentorial white matter hypodensities exclusive of the aforementioned abnormality compatible with mild chronic small vessel ischemic disease. No acute large vascular territory infarct. Similar prominent RIGHT supratentorial cerebral spinal fluid space suggesting old hygroma without mass effect, stable from prior imaging. Basal cisterns are patent. VASCULAR: Mild calcific atherosclerosis of the carotid siphons. SKULL: No skull fracture. No significant scalp soft tissue swelling. SINUSES/ORBITS: The mastoid air-cells and included paranasal sinuses are well-aerated.The included ocular globes and orbital contents are non-suspicious. Status post bilateral ocular lens implants OTHER: None. IMPRESSION: 1. No acute intracranial process. 2. Stable examination including old LEFT PCA infarct and LEFT posterior watershed versus LEFT MCA territory infarcts. Electronically Signed   By: Elon Alas M.D.   On: 07/13/2016 16:03   Ct Abdomen Pelvis W Contrast  Result  Date: 07/13/2016 CLINICAL DATA:  Possible urinary tract infection. Right lower quadrant pain. Confusion. EXAM: CT ABDOMEN AND PELVIS WITH CONTRAST TECHNIQUE: Multidetector CT imaging of the abdomen and pelvis was performed using the standard protocol following bolus administration of intravenous contrast. CONTRAST:  110mL ISOVUE-300 IOPAMIDOL (ISOVUE-300) INJECTION 61% COMPARISON:  None. FINDINGS: Lower chest: Bibasilar atelectasis. Cardiomegaly, accentuated by a pectus excavatum deformity. Tiny hiatal hernia. Hepatobiliary: Mild degradation secondary to patient arm position, not raised above the head. Mild hepatomegaly at 18.5 cm craniocaudal. Suspect mild hepatic steatosis. Too small to characterize right hepatic lobe lesion. Normal gallbladder, without biliary ductal dilatation. Pancreas: Normal, without mass or ductal dilatation. Spleen: Normal in size, without focal abnormality. Adrenals/Urinary Tract: Normal adrenal glands. At least partially duplicated left renal collecting system. Mild right-sided hydroureteronephrosis. This continues to the level of the pelvic brim. A calcification in this region measures 4 mm, including on image 75/ series 7. Difficult to differentiate if this is vascular or within the ureter. The distal ureter is normal in caliber. Normal urinary bladder. Stomach/Bowel: Normal remainder of the stomach. Scattered colonic diverticula. Colonic stool burden suggests constipation. Normal terminal ileum. Appendectomy. Normal small bowel. Vascular/Lymphatic: Aortic and branch vessel atherosclerosis. No abdominopelvic adenopathy. Reproductive: Hysterectomy.  No adnexal mass. Other: No significant free fluid. Musculoskeletal: Lumbosacral spondylosis with disc bulge at L4-5. IMPRESSION: 1. Mild right-sided hydroureteronephrosis. This continues to the level of a calcification which is either within or adjacent (vascular) to the mid to distal right ureter. Given size transition in this area, favored  to represent a ureteric stone. 2.  Possible constipation. 3.  Aortic Atherosclerosis (ICD10-I70.0). 4. Hepatomegaly and probable mild hepatic steatosis. Electronically Signed   By: Abigail Miyamoto M.D.   On: 07/13/2016 16:27        Scheduled Meds: . atorvastatin  10 mg Oral Q2000  . calcium-vitamin D  1 tablet Oral BID  . magnesium hydroxide  30 mL Oral Daily  . metoprolol tartrate  12.5 mg Oral BID  . mirtazapine  7.5 mg Oral QHS  . pantoprazole  40 mg Oral Daily  . polyethylene glycol  17 g Oral Daily  . Rivaroxaban  15 mg Oral q1800  . senna-docusate  1 tablet Oral BID  . sertraline  75 mg Oral q1800   Continuous Infusions: . cefTRIAXone (ROCEPHIN)  IV Stopped (07/13/16 1757)     LOS: 0 days    Time spent: 25 min    Agar, DO [facility] Pager [phone]  If 7PM-7AM, please contact night-coverage [email or web address] Password  [email or web address] 07/14/2016, 11:29 AM

## 2016-07-15 DIAGNOSIS — F28 Other psychotic disorder not due to a substance or known physiological condition: Secondary | ICD-10-CM

## 2016-07-15 DIAGNOSIS — N1330 Unspecified hydronephrosis: Secondary | ICD-10-CM

## 2016-07-15 DIAGNOSIS — S41151A Open bite of right upper arm, initial encounter: Secondary | ICD-10-CM

## 2016-07-15 DIAGNOSIS — W540XXA Bitten by dog, initial encounter: Secondary | ICD-10-CM

## 2016-07-15 DIAGNOSIS — R443 Hallucinations, unspecified: Secondary | ICD-10-CM

## 2016-07-15 DIAGNOSIS — D6859 Other primary thrombophilia: Secondary | ICD-10-CM

## 2016-07-15 LAB — URINE CULTURE

## 2016-07-15 LAB — BASIC METABOLIC PANEL
Anion gap: 8 (ref 5–15)
BUN: 9 mg/dL (ref 6–20)
CO2: 23 mmol/L (ref 22–32)
CREATININE: 1.04 mg/dL — AB (ref 0.44–1.00)
Calcium: 9 mg/dL (ref 8.9–10.3)
Chloride: 107 mmol/L (ref 101–111)
GFR, EST AFRICAN AMERICAN: 55 mL/min — AB (ref >60)
GFR, EST NON AFRICAN AMERICAN: 47 mL/min — AB (ref >60)
Glucose, Bld: 88 mg/dL (ref 65–99)
Potassium: 3.3 mmol/L — ABNORMAL LOW (ref 3.5–5.1)
SODIUM: 138 mmol/L (ref 135–145)

## 2016-07-15 LAB — CBC
HCT: 41.3 % (ref 36.0–46.0)
Hemoglobin: 13.3 g/dL (ref 12.0–15.0)
MCH: 30.8 pg (ref 26.0–34.0)
MCHC: 32.2 g/dL (ref 30.0–36.0)
MCV: 95.6 fL (ref 78.0–100.0)
PLATELETS: 191 10*3/uL (ref 150–400)
RBC: 4.32 MIL/uL (ref 3.87–5.11)
RDW: 13.8 % (ref 11.5–15.5)
WBC: 6.5 10*3/uL (ref 4.0–10.5)

## 2016-07-15 LAB — FOLATE RBC
FOLATE, RBC: 872 ng/mL (ref >498)
Folate, Hemolysate: 359.3 ng/mL
HEMATOCRIT: 41.2 % (ref 34.0–46.6)

## 2016-07-15 MED ORDER — ENSURE ENLIVE PO LIQD
237.0000 mL | Freq: Every day | ORAL | Status: DC
  Administered 2016-07-15: 237 mL via ORAL

## 2016-07-15 MED ORDER — LORAZEPAM 0.5 MG PO TABS
0.5000 mg | ORAL_TABLET | Freq: Once | ORAL | Status: AC
  Administered 2016-07-15: 0.5 mg via ORAL
  Filled 2016-07-15: qty 1

## 2016-07-15 MED ORDER — QUETIAPINE FUMARATE 25 MG PO TABS
25.0000 mg | ORAL_TABLET | Freq: Three times a day (TID) | ORAL | Status: DC
  Administered 2016-07-15 – 2016-07-17 (×6): 25 mg via ORAL
  Filled 2016-07-15 (×6): qty 1

## 2016-07-15 MED ORDER — SERTRALINE HCL 100 MG PO TABS
100.0000 mg | ORAL_TABLET | Freq: Every day | ORAL | Status: DC
  Administered 2016-07-15 – 2016-07-16 (×2): 100 mg via ORAL
  Filled 2016-07-15 (×2): qty 1

## 2016-07-15 MED ORDER — NYSTATIN 100000 UNIT/ML MT SUSP
5.0000 mL | Freq: Four times a day (QID) | OROMUCOSAL | Status: DC
  Administered 2016-07-15 – 2016-07-16 (×5): 500000 [IU] via ORAL
  Filled 2016-07-15 (×5): qty 5

## 2016-07-15 MED ORDER — POTASSIUM CHLORIDE CRYS ER 20 MEQ PO TBCR
40.0000 meq | EXTENDED_RELEASE_TABLET | Freq: Once | ORAL | Status: AC
  Administered 2016-07-15: 40 meq via ORAL
  Filled 2016-07-15: qty 2

## 2016-07-15 MED ORDER — CYANOCOBALAMIN 1000 MCG/ML IJ SOLN
1000.0000 ug | Freq: Once | INTRAMUSCULAR | Status: AC
  Administered 2016-07-15: 1000 ug via INTRAMUSCULAR
  Filled 2016-07-15: qty 1

## 2016-07-15 MED ORDER — MIRTAZAPINE 15 MG PO TABS
15.0000 mg | ORAL_TABLET | Freq: Every day | ORAL | Status: DC
  Administered 2016-07-15 – 2016-07-16 (×2): 15 mg via ORAL
  Filled 2016-07-15 (×2): qty 1

## 2016-07-15 NOTE — Progress Notes (Signed)
PROGRESS NOTE    Erica [name]  [id number]:[id number] DOB: Mar 15, 1927 DOA: 07/13/2016 PCP: Estill Dooms, MD   Outpatient Specialists:     Brief Narrative:  Erica [name] is a 83 y.o. female with a Past Medical History significant for hypertension, stroke, constipation who presents with acute encephalopathy.   Assessment & Plan:   Principal Problem:   Acute encephalopathy Active Problems:   DVT (deep venous thrombosis) (HCC)   Protein C deficiency (HCC)   GERD (gastroesophageal reflux disease)   Sinusitis, chronic   Abdominal pain, epigastric   Constipation   Abdominal bloating   UTI (urinary tract infection)   Hypertension   Stroke ([place])   Acute encephalopathy with auditory and visual hallucinations. Likely related to infectious process vs worsening dementia -CT of the abdomen with mild right hydroureteronephrosis, obstipation. -IV fluids -urine culture with multiple species but sent after abx -Rocephin x 5 days -B12- lower end of normal-- replace PO -psych consult for hallucinations- spoke with Dr. Lenna Sciara  Urinary tract infection -Rocephin x 5 days  Abdominal bloating/pain due to constipation. -Milk of magnesia -Colace -senna -suppository if needed   Hypertension -Continue Lopressor -Monitor  History of stroke and DVT with protein c def. Some mild left-sided weakness and dysarthria. Patient on Xarelto -Physical therapy- home health PT -Continue home meds    DVT prophylaxis:  Fully anticoagulated- xarelto   Code Status: DNR   Family Communication: Daughter on phone  Disposition Plan:  From ALF   Consultants:  psych  Subjective: Says there is a woman trying to kill her- "wants me to become her"  Objective: Vitals:   07/14/16 2104 07/15/16 0528 07/15/16 0923 07/15/16 1002  BP: (!) 130/51 (!) 136/57 (!) 140/58   Pulse: 64 66 (!) 51 62  Resp: 17 18 18    Temp: 98.5 F (36.9 C) 98.3 F (36.8 C) 98 F (36.7 C)   TempSrc: Oral Oral Oral     SpO2: 93% 96% 95%   Weight: 65.8 kg (145 lb)     Height:        Intake/Output Summary (Last 24 hours) at 07/15/16 1233 Last data filed at 07/15/16 1231  Gross per 24 hour  Intake              520 ml  Output                0 ml  Net              520 ml   Filed Weights   07/13/16 1817 07/13/16 2053 07/14/16 2104  Weight: 68.9 kg (151 lb 12.8 oz) 68.9 kg (151 lb 12.8 oz) 65.8 kg (145 lb)    Examination:  General exam: Anxious appearing Respiratory system: Clear, no wheezing Cardiovascular system: rrr, min le edema. Gastrointestinal system: +BS, tender in lower quadrant Central nervous system: confused- seeing and hearing person who was not present Skin: No rashes, lesions or ulcers Psychiatry: tearful at times,  paranoid    Data Reviewed: I have personally reviewed following labs and imaging studies  CBC:  Recent Labs Lab 07/13/16 1420 07/14/16 0638 07/15/16 0409  WBC 8.2 5.8 6.5  NEUTROABS 5.7  --   --   HGB 14.4 13.2 13.3  HCT 43.9 41.3 41.3  MCV 94.2 97.9 95.6  PLT 213 210 433   Basic Metabolic Panel:  Recent Labs Lab 07/13/16 1420 07/14/16 0638 07/15/16 0409  NA 140 142 138  K 3.5 3.7 3.3*  CL 109 111 107  CO2  23 23 23   GLUCOSE 113* 94 88  BUN 10 7 9   CREATININE 0.99 1.01* 1.04*  CALCIUM 9.2 8.8* 9.0  MG 2.4  --   --   PHOS 2.3*  --   --    GFR: Estimated Creatinine Clearance: 35.4 mL/min (A) (by C-G formula based on SCr of 1.04 mg/dL (H)). Liver Function Tests:  Recent Labs Lab 07/13/16 1420  AST 25  ALT 16  ALKPHOS 110  BILITOT 0.8  PROT 6.8  ALBUMIN 4.2   No results for input(s): LIPASE, AMYLASE in the last 168 hours. No results for input(s): AMMONIA in the last 168 hours. Coagulation Profile: No results for input(s): INR, PROTIME in the last 168 hours. Cardiac Enzymes: No results for input(s): CKTOTAL, CKMB, CKMBINDEX, TROPONINI in the last 168 hours. BNP (last 3 results) No results for input(s): PROBNP in the last 8760  hours. HbA1C: No results for input(s): HGBA1C in the last 72 hours. CBG: No results for input(s): GLUCAP in the last 168 hours. Lipid Profile: No results for input(s): CHOL, HDL, LDLCALC, TRIG, CHOLHDL, LDLDIRECT in the last 72 hours. Thyroid Function Tests: No results for input(s): TSH, T4TOTAL, FREET4, T3FREE, THYROIDAB in the last 72 hours. Anemia Panel:  Recent Labs  07/14/16 1153  VITAMINB12 192   Urine analysis:    Component Value Date/Time   COLORURINE YELLOW 07/13/2016 1340   APPEARANCEUR HAZY (A) 07/13/2016 1340   LABSPEC 1.009 07/13/2016 1340   PHURINE 5.0 07/13/2016 1340   GLUCOSEU NEGATIVE 07/13/2016 1340   HGBUR SMALL (A) 07/13/2016 1340   BILIRUBINUR NEGATIVE 07/13/2016 1340   KETONESUR NEGATIVE 07/13/2016 1340   PROTEINUR NEGATIVE 07/13/2016 1340   NITRITE NEGATIVE 07/13/2016 1340   LEUKOCYTESUR LARGE (A) 07/13/2016 1340     ) Recent Results (from the past 240 hour(s))  Urine culture     Status: Abnormal   Collection Time: 07/13/16  1:40 PM  Result Value Ref Range Status   Specimen Description URINE, RANDOM  Final   Special Requests NONE  Final   Culture MULTIPLE ORGANISMS PRESENT, NONE PREDOMINANT (A)  Final   Report Status 07/15/2016 FINAL  Final  MRSA PCR Screening     Status: None   Collection Time: 07/13/16  9:13 PM  Result Value Ref Range Status   MRSA by PCR NEGATIVE NEGATIVE Final    Comment:        The GeneXpert MRSA Assay (FDA approved for NASAL specimens only), is one component of a comprehensive MRSA colonization surveillance program. It is not intended to diagnose MRSA infection nor to guide or monitor treatment for MRSA infections.       Anti-infectives    Start     Dose/Rate Route Frequency Ordered Stop   07/13/16 1700  cefTRIAXone (ROCEPHIN) 1 g in dextrose 5 % 50 mL IVPB     1 g 100 mL/hr over 30 Minutes Intravenous Every 24 hours 07/13/16 1638         Radiology Studies: Dg Chest 2 View  Result Date:  07/13/2016 CLINICAL DATA:  Patient very confused and unable to provide history. Hallucinations and possible UTI. EXAM: CHEST  2 VIEW COMPARISON:  08/20/2014. FINDINGS: Low volume film. Cardiopericardial silhouette is at upper limits of normal for size. There is pulmonary vascular congestion without overt pulmonary edema. Subsegmental atelectasis noted left lung base. No pleural effusion. The visualized bony structures of the thorax are intact. IMPRESSION: Low lung volumes with borderline cardiomegaly and vascular congestion. Electronically Signed   By: Randall Hiss  Tery Sanfilippo M.D.   On: 07/13/2016 14:54   Ct Head Wo Contrast  Result Date: 07/13/2016 CLINICAL DATA:  Hallucination, confusion. Possible urinary tract infection. History of hypertension and stroke. EXAM: CT HEAD WITHOUT CONTRAST TECHNIQUE: Contiguous axial images were obtained from the base of the skull through the vertex without intravenous contrast. COMPARISON:  None. FINDINGS: BRAIN: No intraparenchymal hemorrhage, mass effect nor midline shift. LEFT temporoparietal and occipital lobe encephalomalacia with ex vacuo dilatation LEFT atrium and occipital horn, no hydrocephalus. Patchy supratentorial white matter hypodensities exclusive of the aforementioned abnormality compatible with mild chronic small vessel ischemic disease. No acute large vascular territory infarct. Similar prominent RIGHT supratentorial cerebral spinal fluid space suggesting old hygroma without mass effect, stable from prior imaging. Basal cisterns are patent. VASCULAR: Mild calcific atherosclerosis of the carotid siphons. SKULL: No skull fracture. No significant scalp soft tissue swelling. SINUSES/ORBITS: The mastoid air-cells and included paranasal sinuses are well-aerated.The included ocular globes and orbital contents are non-suspicious. Status post bilateral ocular lens implants OTHER: None. IMPRESSION: 1. No acute intracranial process. 2. Stable examination including old LEFT PCA  infarct and LEFT posterior watershed versus LEFT MCA territory infarcts. Electronically Signed   By: Elon Alas M.D.   On: 07/13/2016 16:03   Ct Abdomen Pelvis W Contrast  Result Date: 07/13/2016 CLINICAL DATA:  Possible urinary tract infection. Right lower quadrant pain. Confusion. EXAM: CT ABDOMEN AND PELVIS WITH CONTRAST TECHNIQUE: Multidetector CT imaging of the abdomen and pelvis was performed using the standard protocol following bolus administration of intravenous contrast. CONTRAST:  190mL ISOVUE-300 IOPAMIDOL (ISOVUE-300) INJECTION 61% COMPARISON:  None. FINDINGS: Lower chest: Bibasilar atelectasis. Cardiomegaly, accentuated by a pectus excavatum deformity. Tiny hiatal hernia. Hepatobiliary: Mild degradation secondary to patient arm position, not raised above the head. Mild hepatomegaly at 18.5 cm craniocaudal. Suspect mild hepatic steatosis. Too small to characterize right hepatic lobe lesion. Normal gallbladder, without biliary ductal dilatation. Pancreas: Normal, without mass or ductal dilatation. Spleen: Normal in size, without focal abnormality. Adrenals/Urinary Tract: Normal adrenal glands. At least partially duplicated left renal collecting system. Mild right-sided hydroureteronephrosis. This continues to the level of the pelvic brim. A calcification in this region measures 4 mm, including on image 75/ series 7. Difficult to differentiate if this is vascular or within the ureter. The distal ureter is normal in caliber. Normal urinary bladder. Stomach/Bowel: Normal remainder of the stomach. Scattered colonic diverticula. Colonic stool burden suggests constipation. Normal terminal ileum. Appendectomy. Normal small bowel. Vascular/Lymphatic: Aortic and branch vessel atherosclerosis. No abdominopelvic adenopathy. Reproductive: Hysterectomy.  No adnexal mass. Other: No significant free fluid. Musculoskeletal: Lumbosacral spondylosis with disc bulge at L4-5. IMPRESSION: 1. Mild right-sided  hydroureteronephrosis. This continues to the level of a calcification which is either within or adjacent (vascular) to the mid to distal right ureter. Given size transition in this area, favored to represent a ureteric stone. 2.  Possible constipation. 3.  Aortic Atherosclerosis (ICD10-I70.0). 4. Hepatomegaly and probable mild hepatic steatosis. Electronically Signed   By: Abigail Miyamoto M.D.   On: 07/13/2016 16:27        Scheduled Meds: . atorvastatin  10 mg Oral Q2000  . calcium-vitamin D  1 tablet Oral BID  . magnesium hydroxide  30 mL Oral Daily  . metoprolol tartrate  12.5 mg Oral BID  . mirtazapine  7.5 mg Oral QHS  . nystatin  5 mL Oral QID  . pantoprazole  40 mg Oral Daily  . polyethylene glycol  17 g Oral Daily  . Rivaroxaban  15  mg Oral q1800  . senna-docusate  1 tablet Oral BID  . sertraline  75 mg Oral q1800   Continuous Infusions: . cefTRIAXone (ROCEPHIN)  IV Stopped (07/14/16 1630)     LOS: 1 day    Time spent: 37 min    Gray, DO [facility] Pager [phone]  If 7PM-7AM, please contact night-coverage [email or web address] Password [facility] 07/15/2016, 12:33 PM
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
## 2016-07-15 NOTE — Progress Notes (Signed)
Initial Nutrition Assessment  DOCUMENTATION CODES:   Not applicable  INTERVENTION:   Recommend Ensure Enlive po daily, each supplement provides 350 kcal and 20 grams of protein  NUTRITION DIAGNOSIS:   Inadequate oral intake related to acute illness as evidenced by meal completion < 50%.  GOAL:   Patient will meet greater than or equal to 90% of their needs  MONITOR:   PO intake, Supplement acceptance, Labs, Weight trends  REASON FOR ASSESSMENT:   Malnutrition Screening Tool    ASSESSMENT:    83 yo female admitted with acute encephalopathy with delerium and psychosis secondary to UTI, hx of dementia at baseline. Pt with hx of HTN, stroke, dementia  Pt very confused on visit today.  Recorded po intake 45% of meal on average. Pt reports her appetite is good.   Per weight encounters, no significant wt loss noted  Nutrition-Focused physical exam completed. Findings are WDL for fat depletion, muscle depletion, and edema.   Labs: potassium 3.3 (L) Meds: KCl, remeron, Vitamin B12, Oscal D  Diet Order:  Diet Heart Room service appropriate? Yes; Fluid consistency: Thin  Skin:  Reviewed, no issues  Last BM:  07/14/16  Height:   Ht Readings from Last 1 Encounters:  07/13/16 5\' 3"  (1.6 m)    Weight:   Wt Readings from Last 1 Encounters:  07/14/16 145 lb (65.8 kg)   BMI:  Body mass index is 25.69 kg/m.  Estimated Nutritional Needs:   Kcal:  1300-1500 kcals  Protein:  73-86 g  Fluid:  >/= 1.3 L  EDUCATION NEEDS:   No education needs identified at this time  Ronan, McCall, LDN [phone] Pager  [phone] Weekend/On-Call Pager

## 2016-07-15 NOTE — Evaluation (Signed)
Physical Therapy Evaluation Patient Details Name: Erica [name] MRN: [id number] DOB: 1927-05-26 Today's Date: 07/15/2016   History of Present Illness  Erica [name] is a 83 y.o. female with a Past Medical History significant for hypertension, stroke, constipation who presents with acute encephalopathy; found to have UTI  Clinical Impression   Pt admitted with above diagnosis. Pt currently with functional limitations due to the deficits listed below (see PT Problem List). Presents with unsteadiness with gait and cognitive deficits; unsure exactly what her baseline cognitive status is; Emotionally labile during session, but more open to getting up and moving with song (she sang the upper harmony to "You Are My Sunshine"); I favor getting her back to her familiar home environment, routine, and caregivers -- am hopeful that her current AMS will clear as her infection clears;  Pt will benefit from skilled PT to increase their independence and safety with mobility to allow discharge to the venue listed below.       Follow Up Recommendations Home health PT;Other (comment) (at ALF)    Equipment Recommendations  None recommended by PT    Recommendations for Other Services OT consult (placed order)     Precautions / Restrictions Precautions Precautions: Fall Restrictions Weight Bearing Restrictions: No      Mobility  Bed Mobility Overal bed mobility: Needs Assistance Bed Mobility: Sit to Supine       Sit to supine: Min assist   General bed mobility comments: Cues to initiate and handheld assist to pull to sit  Transfers Overall transfer level: Needs assistance Equipment used: 1 person hand held assist Transfers: Sit to/from Stand Sit to Stand: Min assist         General transfer comment: Min assist to steady; stood impulsively initially, lost balance posteriorly and sat back to bed without warning; Stood with Min assist to steady  Ambulation/Gait Ambulation/Gait assistance:  Physicist, medical (Feet): 140 Feet Assistive device: Rolling walker (2 wheeled) Gait Pattern/deviations: Step-through pattern;Trunk flexed Gait velocity: slowed   General Gait Details: Overall good use of RW, occasional cues to keep RW close; Performed simple dance moves (hip swaying, step-touch) with hands on RW while singing songs  Stairs            Wheelchair Mobility    Modified Rankin (Stroke Patients Only)       Balance Overall balance assessment: Needs assistance           Standing balance-Leahy Scale: Poor (approaching Fair) Standing balance comment: bil UE support                             Pertinent Vitals/Pain Pain Assessment: Faces Faces Pain Scale: Hurts a little bit Pain Location: "everywhere" Pain Descriptors / Indicators: Discomfort (nonspecific) Pain Intervention(s): Monitored during session    Home Living Family/patient expects to be discharged to:: Assisted living               Home Equipment: Gilford Rile - 2 wheels Additional Comments: Per H&P: walks with RW, needs prompting for ADLs    Prior Function Level of Independence: Needs assistance   Gait / Transfers Assistance Needed: with RW     Comments: Unsure exactly how much assist Erica [name] needs for ADLs at [facility]        Extremity/Trunk Assessment   Upper Extremity Assessment Upper Extremity Assessment: Defer to OT evaluation    Lower Extremity Assessment Lower Extremity Assessment:  Overall [facility] for tasks assessed (for simple mobility tasks)       Communication   Communication: No difficulties;Other (comment) (emotionally labile)  Cognition Arousal/Alertness: Awake/alert Behavior During Therapy: WFL for tasks assessed/performed (emotionally labile) Overall Cognitive Status: No family/caregiver present to determine baseline cognitive functioning Area of Impairment: Memory;Safety/judgement;Orientation                  Orientation Level: Disoriented to;Place;Time;Situation   Memory: Decreased short-term memory   Safety/Judgement: Decreased awareness of safety;Decreased awareness of deficits     General Comments: Emotionally labile; began to cry early in session; participated more with walking and even "step-touch" footwork dancing when singing "You are My Sunshine" and "He's Got the Whole World in His Hands"      General Comments      Exercises     Assessment/Plan    PT Assessment Patient needs continued PT services  PT Problem List Decreased activity tolerance;Decreased balance;Decreased mobility;Decreased cognition;Decreased knowledge of use of DME;Decreased safety awareness;Decreased knowledge of precautions;Pain       PT Treatment Interventions DME instruction;Gait training;Stair training;Functional mobility training;Therapeutic activities;Therapeutic exercise;Balance training;Cognitive remediation;Patient/family education    PT Goals (Current goals can be found in the Care Plan section)  Acute Rehab PT Goals Patient Stated Goal: Did not state PT Goal Formulation: Patient unable to participate in goal setting Time For Goal Achievement: 07/29/16 Potential to Achieve Goals: Good    Frequency Min 3X/week   Barriers to discharge Other (comment) Would like to know exactly how much assist is available to Erica [name] at her ALF    Co-evaluation               AM-PAC PT "6 Clicks" Daily Activity  Outcome Measure Difficulty turning over in bed (including adjusting bedclothes, sheets and blankets)?: A Little Difficulty moving from lying on back to sitting on the side of the bed? : A Little Difficulty sitting down on and standing up from a chair with arms (e.g., wheelchair, bedside commode, etc,.)?: Total Help needed moving to and from a bed to chair (including a wheelchair)?: A Little Help needed walking in hospital room?: A Little Help needed climbing 3-5 steps with a railing? :  A Lot 6 Click Score: 15    End of Session Equipment Utilized During Treatment: Gait belt Activity Tolerance: Patient tolerated treatment well Patient left: in chair;with call bell/phone within reach;with chair alarm set Nurse Communication: Mobility status PT Visit Diagnosis: Unsteadiness on feet (R26.81);Other abnormalities of gait and mobility (R26.89)    Time: 0141-0301 PT Time Calculation (min) (ACUTE ONLY): 23 min   Charges:   PT Evaluation $PT Eval Moderate Complexity: 1 Procedure PT Treatments $Gait Training: 8-22 mins   PT G Codes:        Roney Marion, PT  Acute Rehabilitation Services Pager [phone] Office [phone]   Colletta [place] 07/15/2016, 10:25 AM

## 2016-07-16 NOTE — Clinical Social Work Note (Addendum)
CSW received consult that patient admitted from facility. Notes reviewed and CSW also received call on 7/10 from Dia , admissions staff person at [facility], confirming that patient from their [place] and has been there since 04/25/13. CSW advised that patient's daughter, Erica [name] ([phone]) is patient's main contact, very involved with her mother and is employed at [employer].  Call also received from admissions staff on 7/11 following up on patient to determine readiness for discharge. CSW will contact daughter regarding discharge disposition and assist with d/c back to [facility] when medically stable.

## 2016-07-16 NOTE — Discharge Instructions (Signed)
Information on my medicine - XARELTO® (Rivaroxaban) ° °This medication education was reviewed with me or my healthcare representative as part of my discharge preparation.  ° °Why was Xarelto® prescribed for you? °Xarelto® was prescribed for you to reduce the risk of a blood clot forming that can cause a stroke if you have a medical condition called atrial fibrillation (a type of irregular heartbeat). ° °What do you need to know about xarelto® ? °Take your Xarelto® 15 mg ONCE DAILY at the same time every day with your evening meal. °If you have difficulty swallowing the tablet whole, you may crush it and mix in applesauce just prior to taking your dose. ° °Take Xarelto® exactly as prescribed by your doctor and DO NOT stop taking Xarelto® without talking to the doctor who prescribed the medication.  Stopping without other stroke prevention medication to take the place of Xarelto® may increase your risk of developing a clot that causes a stroke.  Refill your prescription before you run out. ° °After discharge, you should have regular check-up appointments with your healthcare provider that is prescribing your Xarelto®.  In the future your dose may need to be changed if your kidney function or weight changes by a significant amount. ° °What do you do if you miss a dose? °If you are taking Xarelto® ONCE DAILY and you miss a dose, take it as soon as you remember on the same day then continue your regularly scheduled once daily regimen the next day. Do not take two doses of Xarelto® at the same time or on the same day.  ° °Important Safety Information °A possible side effect of Xarelto® is bleeding. You should call your healthcare provider right away if you experience any of the following: °? Bleeding from an injury or your nose that does not stop. °? Unusual colored urine (red or dark brown) or unusual colored stools (red or black). °? Unusual bruising for unknown reasons. °? A serious fall or if you hit your head (even  if there is no bleeding). ° °Some medicines may interact with Xarelto® and might increase your risk of bleeding while on Xarelto®. To help avoid this, consult your healthcare provider or pharmacist prior to using any new prescription or non-prescription medications, including herbals, vitamins, non-steroidal anti-inflammatory drugs (NSAIDs) and supplements. ° °This website has more information on Xarelto®: www.xarelto.com. ° ° °

## 2016-07-16 NOTE — Progress Notes (Signed)
PROGRESS NOTE    Erica [name]  JIR:[id number] DOB: 05-22-1927 DOA: 07/13/2016 PCP: Estill Dooms, MD   Outpatient Specialists:     Brief Narrative:  Erica [name] is a 83 y.o. female with a Past Medical History significant for hypertension, stroke, constipation who presents with acute encephalopathy.   Assessment & Plan:   Principal Problem:   Acute encephalopathy Active Problems:   DVT (deep venous thrombosis) (HCC)   Protein C deficiency (HCC)   GERD (gastroesophageal reflux disease)   Sinusitis, chronic   Abdominal pain, epigastric   Constipation   Abdominal bloating   UTI (urinary tract infection)   Hypertension   Stroke ([place])   Acute encephalopathy with auditory and visual hallucinations. Likely related to infectious process vs worsening dementia -CT of the abdomen with mild right hydroureteronephrosis, obstipation. -IV fluids -urine culture with multiple species but sent after abx -Rocephin x 5 days -B12- lower end of normal-- replace PO -psych consult for hallucinations-added seroquel, and increased remeron and zoloft-- spoke with daughter who states patient at baseline except for hallucinations but did report that a few years ago patient had similar episode that resolved on own  Urinary tract infection -Rocephin x 5 days  Abdominal bloating/pain due to constipation. -Milk of magnesia -Colace -senna -suppository if needed   Hypertension -Continue Lopressor -Monitor  History of stroke and DVT with protein c def. Some mild left-sided weakness and dysarthria. Patient on Xarelto -Physical therapy- home health PT -Continue home meds  Hypokalemia -replete  DVT prophylaxis:  Fully anticoagulated- xarelto   Code Status: DNR   Family Communication: Daughter at bedside  Disposition Plan:  To SNF in AM (per daughter they have a geri-psych MD that comes to facility)   Consultants:  psych  Subjective: Says she is not doing well  today Nursing reports very impulsive  Objective: Vitals:   07/15/16 1700 07/15/16 2116 07/16/16 0509 07/16/16 0900  BP: (!) 144/64 (!) 119/54 140/71 136/69  Pulse: 62 68 82 71  Resp: 18 18 18 18   Temp: 97.9 F (36.6 C) 98.2 F (36.8 C) 98.2 F (36.8 C) 98.9 F (37.2 C)  TempSrc: Oral Oral Oral Oral  SpO2: 96% 94% 96% 95%  Weight:  65.9 kg (145 lb 4.8 oz)    Height:        Intake/Output Summary (Last 24 hours) at 07/16/16 1631 Last data filed at 07/16/16 0510  Gross per 24 hour  Intake                0 ml  Output                0 ml  Net                0 ml   Filed Weights   07/13/16 2053 07/14/16 2104 07/15/16 2116  Weight: 68.9 kg (151 lb 12.8 oz) 65.8 kg (145 lb) 65.9 kg (145 lb 4.8 oz)    Examination:  General exam: NAD, in chair with daughter visiting Respiratory system: clear Cardiovascular system: rrr Gastrointestinal system: +BS, soft Central nervous system: still reporting ppl in room that are not present  Skin: No rashes, lesions or ulcers     Data Reviewed: I have personally reviewed following labs and imaging studies  CBC:  Recent Labs Lab 07/13/16 1420 07/13/16 1907 07/14/16 0638 07/15/16 0409  WBC 8.2  --  5.8 6.5  NEUTROABS 5.7  --   --   --   HGB 14.4  --  13.2 13.3  HCT 43.9 41.2 41.3 41.3  MCV 94.2  --  97.9 95.6  PLT 213  --  210 962   Basic Metabolic Panel:  Recent Labs Lab 07/13/16 1420 07/14/16 0638 07/15/16 0409  NA 140 142 138  K 3.5 3.7 3.3*  CL 109 111 107  CO2 23 23 23   GLUCOSE 113* 94 88  BUN 10 7 9   CREATININE 0.99 1.01* 1.04*  CALCIUM 9.2 8.8* 9.0  MG 2.4  --   --   PHOS 2.3*  --   --    GFR: Estimated Creatinine Clearance: 35.4 mL/min (A) (by C-G formula based on SCr of 1.04 mg/dL (H)). Liver Function Tests:  Recent Labs Lab 07/13/16 1420  AST 25  ALT 16  ALKPHOS 110  BILITOT 0.8  PROT 6.8  ALBUMIN 4.2   No results for input(s): LIPASE, AMYLASE in the last 168 hours. No results for input(s):  AMMONIA in the last 168 hours. Coagulation Profile: No results for input(s): INR, PROTIME in the last 168 hours. Cardiac Enzymes: No results for input(s): CKTOTAL, CKMB, CKMBINDEX, TROPONINI in the last 168 hours. BNP (last 3 results) No results for input(s): PROBNP in the last 8760 hours. HbA1C: No results for input(s): HGBA1C in the last 72 hours. CBG: No results for input(s): GLUCAP in the last 168 hours. Lipid Profile: No results for input(s): CHOL, HDL, LDLCALC, TRIG, CHOLHDL, LDLDIRECT in the last 72 hours. Thyroid Function Tests: No results for input(s): TSH, T4TOTAL, FREET4, T3FREE, THYROIDAB in the last 72 hours. Anemia Panel:  Recent Labs  07/14/16 1153  VITAMINB12 192   Urine analysis:    Component Value Date/Time   COLORURINE YELLOW 07/13/2016 1340   APPEARANCEUR HAZY (A) 07/13/2016 1340   LABSPEC 1.009 07/13/2016 1340   PHURINE 5.0 07/13/2016 1340   GLUCOSEU NEGATIVE 07/13/2016 1340   HGBUR SMALL (A) 07/13/2016 1340   BILIRUBINUR NEGATIVE 07/13/2016 1340   KETONESUR NEGATIVE 07/13/2016 1340   PROTEINUR NEGATIVE 07/13/2016 1340   NITRITE NEGATIVE 07/13/2016 1340   LEUKOCYTESUR LARGE (A) 07/13/2016 1340     ) Recent Results (from the past 240 hour(s))  Urine culture     Status: Abnormal   Collection Time: 07/13/16  1:40 PM  Result Value Ref Range Status   Specimen Description URINE, RANDOM  Final   Special Requests NONE  Final   Culture MULTIPLE ORGANISMS PRESENT, NONE PREDOMINANT (A)  Final   Report Status 07/15/2016 FINAL  Final  MRSA PCR Screening     Status: None   Collection Time: 07/13/16  9:13 PM  Result Value Ref Range Status   MRSA by PCR NEGATIVE NEGATIVE Final    Comment:        The GeneXpert MRSA Assay (FDA approved for NASAL specimens only), is one component of a comprehensive MRSA colonization surveillance program. It is not intended to diagnose MRSA infection nor to guide or monitor treatment for MRSA infections.        Anti-infectives    Start     Dose/Rate Route Frequency Ordered Stop   07/13/16 1700  cefTRIAXone (ROCEPHIN) 1 g in dextrose 5 % 50 mL IVPB     1 g 100 mL/hr over 30 Minutes Intravenous Every 24 hours 07/13/16 1638 07/18/16 1659       Radiology Studies: No results found.      Scheduled Meds: . atorvastatin  10 mg Oral Q2000  . calcium-vitamin D  1 tablet Oral BID  . feeding supplement (ENSURE ENLIVE)  237 mL Oral Q1400  . metoprolol tartrate  12.5 mg Oral BID  . mirtazapine  15 mg Oral QHS  . nystatin  5 mL Oral QID  . pantoprazole  40 mg Oral Daily  . polyethylene glycol  17 g Oral Daily  . QUEtiapine  25 mg Oral TID  . Rivaroxaban  15 mg Oral q1800  . sertraline  100 mg Oral q1800   Continuous Infusions: . cefTRIAXone (ROCEPHIN)  IV Stopped (07/15/16 1738)     LOS: 2 days    Time spent: 74 min    Crook, DO [facility] Pager [phone]  If 7PM-7AM, please contact night-coverage [email or web address] Password [facility] 07/16/2016, 4:31 PM

## 2016-07-17 ENCOUNTER — Telehealth: Payer: Self-pay

## 2016-07-17 LAB — BASIC METABOLIC PANEL
Anion gap: 5 (ref 5–15)
BUN: 10 mg/dL (ref 6–20)
CO2: 26 mmol/L (ref 22–32)
Calcium: 9 mg/dL (ref 8.9–10.3)
Chloride: 108 mmol/L (ref 101–111)
Creatinine, Ser: 0.91 mg/dL (ref 0.44–1.00)
GFR calc Af Amer: >60 mL/min (ref >60)
GFR calc non Af Amer: 55 mL/min — ABNORMAL LOW (ref >60)
Glucose, Bld: 97 mg/dL (ref 65–99)
Potassium: 3.8 mmol/L (ref 3.5–5.1)
Sodium: 139 mmol/L (ref 135–145)

## 2016-07-17 MED ORDER — SERTRALINE HCL 100 MG PO TABS: 100.0000 mg | ORAL_TABLET | Freq: Every day | ORAL | Status: DC

## 2016-07-17 MED ORDER — VITAMIN B-12 1000 MCG PO TABS: 1000.0000 ug | ORAL_TABLET | Freq: Every day | ORAL | Status: DC

## 2016-07-17 MED ORDER — RIVAROXABAN 15 MG PO TABS: 15.0000 mg | ORAL_TABLET | Freq: Every day | ORAL | Status: DC

## 2016-07-17 MED ORDER — MIRTAZAPINE 15 MG PO TABS: 15.0000 mg | ORAL_TABLET | Freq: Every day | ORAL | Status: DC

## 2016-07-17 MED ORDER — BISACODYL 10 MG RE SUPP: 10.0000 mg | Freq: Every day | RECTAL | 0 refills | Status: DC | PRN

## 2016-07-17 MED ORDER — ENSURE ENLIVE PO LIQD: 237.0000 mL | Freq: Every day | ORAL | 12 refills | Status: DC

## 2016-07-17 MED ORDER — NYSTATIN 100000 UNIT/ML MT SUSP: 5.0000 mL | Freq: Four times a day (QID) | OROMUCOSAL | 0 refills | Status: DC

## 2016-07-17 MED ORDER — QUETIAPINE FUMARATE 25 MG PO TABS: 25.0000 mg | ORAL_TABLET | Freq: Three times a day (TID) | ORAL | Status: DC

## 2016-07-17 NOTE — Social Work (Addendum)
  CSW received call from nurse on floor about DC summary and contacting daughter.  Per daughter she wld transport patient to facility. Daughter provided number for admissions.  CSW called the facility-admissions, and Mindi Junker [phone] indicated that the [facility] needs to include OT evaluation & PT evaluation needs to reflect 5 x week. CSW f/u with OT and they advised that they just met with patient and can update notes shortly. She advised that the typically put 2-3 x week based on acute needs in hospital. CSW validated. Once notes completed. CSW wil update FL2 for doctor to sign.   CSW will f/u for transition.

## 2016-07-17 NOTE — Discharge Summary (Signed)
Physician Discharge Summary  [name] UXN:[id number] DOB: July 29, 1927 DOA: 07/13/2016  PCP: Estill Dooms, MD  Admit date: 07/13/2016 Discharge date: 07/17/2016   Recommendations for Outpatient Follow-Up:   1. DNR 2. Geriatric psychiatry to follow at SNF when available 3. Nystatin swish and swallow x 14 days 4. Bowel regimen 5. Periodic monitoring of Qtc while on seroquel   Discharge Diagnosis:   Principal Problem:   Acute encephalopathy Active Problems:   DVT (deep venous thrombosis) (HCC)   Protein C deficiency (HCC)   GERD (gastroesophageal reflux disease)   Sinusitis, chronic   Abdominal pain, epigastric   Constipation   Abdominal bloating   UTI (urinary tract infection)   Hypertension   Stroke [facility])   Discharge disposition:   SNF:  Discharge Condition: Improved.  Diet recommendation: Low sodium, heart healthy.  Wound care: None.   History of Present Illness:   Erica [name] a 83 y.o.femalewith a Past Medical History significant for hypertension, stroke, constipation who presents with acute encephalopathy   Hospital Course by Problem:   Acute encephalopathy with auditory and visual hallucinations. Likely related to infectious process vs worsening dementia -CT of the abdomen with mild right hydroureteronephrosis, obstipation. -urine culture with multiple species but sent after abx treated with Rocephin x 5 days -B12- lower end of normal-- replace PO -psych consulted for hallucinations-added seroquel, and increased remeron and zoloft-- spoke with daughter who states patient at baseline except for hallucinations but did report that a few years ago patient had similar episode that resolved on own  Urinary tract infection -Rocephin x 5 days -urine culture sent after abx so unhelpful  Abdominal bloating/pain due to constipation. -Milk of magnesia -Colace -senna -suppository if needed   Hypertension -Continue Lopressor -Monitor  History  of stroke and DVT with protein c def. Some mild left-sided weakness and dysarthria. Patient on Xarelto -SNF -Continue home meds  Hypokalemia -replete  Thrush -swish and swallow   Medical Consultants:    psych   Discharge Exam:   Vitals:   07/16/16 0900 07/16/16 2157  BP: 136/69 (!) 153/75  Pulse: 71 64  Resp: 18 19  Temp: 98.9 F (37.2 C)    Vitals:   07/15/16 2116 07/16/16 0509 07/16/16 0900 07/16/16 2157  BP: (!) 119/54 140/71 136/69 (!) 153/75  Pulse: 68 82 71 64  Resp: 18 18 18 19   Temp: 98.2 F (36.8 C) 98.2 F (36.8 C) 98.9 F (37.2 C)   TempSrc: Oral Oral Oral   SpO2: 94% 96% 95% 95%  Weight: 65.9 kg (145 lb 4.8 oz)     Height:        Gen:  NAD- tearful at times but cooperative with me    The results of significant diagnostics from this hospitalization (including imaging, microbiology, ancillary and laboratory) are listed below for reference.     Procedures and Diagnostic Studies:   Dg Chest 2 View  Result Date: 07/13/2016 CLINICAL DATA:  Patient very confused and unable to provide history. Hallucinations and possible UTI. EXAM: CHEST  2 VIEW COMPARISON:  08/20/2014. FINDINGS: Low volume film. Cardiopericardial silhouette is at upper limits of normal for size. There is pulmonary vascular congestion without overt pulmonary edema. Subsegmental atelectasis noted left lung base. No pleural effusion. The visualized bony structures of the thorax are intact. IMPRESSION: Low lung volumes with borderline cardiomegaly and vascular congestion. Electronically Signed   By: Misty Stanley M.D.   On: 07/13/2016 14:54   Ct Head Wo Contrast  Result Date:  07/13/2016 CLINICAL DATA:  Hallucination, confusion. Possible urinary tract infection. History of hypertension and stroke. EXAM: CT HEAD WITHOUT CONTRAST TECHNIQUE: Contiguous axial images were obtained from the base of the skull through the vertex without intravenous contrast. COMPARISON:  None. FINDINGS: BRAIN: No  intraparenchymal hemorrhage, mass effect nor midline shift. LEFT temporoparietal and occipital lobe encephalomalacia with ex vacuo dilatation LEFT atrium and occipital horn, no hydrocephalus. Patchy supratentorial white matter hypodensities exclusive of the aforementioned abnormality compatible with mild chronic small vessel ischemic disease. No acute large vascular territory infarct. Similar prominent RIGHT supratentorial cerebral spinal fluid space suggesting old hygroma without mass effect, stable from prior imaging. Basal cisterns are patent. VASCULAR: Mild calcific atherosclerosis of the carotid siphons. SKULL: No skull fracture. No significant scalp soft tissue swelling. SINUSES/ORBITS: The mastoid air-cells and included paranasal sinuses are well-aerated.The included ocular globes and orbital contents are non-suspicious. Status post bilateral ocular lens implants OTHER: None. IMPRESSION: 1. No acute intracranial process. 2. Stable examination including old LEFT PCA infarct and LEFT posterior watershed versus LEFT MCA territory infarcts. Electronically Signed   By: Elon Alas M.D.   On: 07/13/2016 16:03   Ct Abdomen Pelvis W Contrast  Result Date: 07/13/2016 CLINICAL DATA:  Possible urinary tract infection. Right lower quadrant pain. Confusion. EXAM: CT ABDOMEN AND PELVIS WITH CONTRAST TECHNIQUE: Multidetector CT imaging of the abdomen and pelvis was performed using the standard protocol following bolus administration of intravenous contrast. CONTRAST:  141mL ISOVUE-300 IOPAMIDOL (ISOVUE-300) INJECTION 61% COMPARISON:  None. FINDINGS: Lower chest: Bibasilar atelectasis. Cardiomegaly, accentuated by a pectus excavatum deformity. Tiny hiatal hernia. Hepatobiliary: Mild degradation secondary to patient arm position, not raised above the head. Mild hepatomegaly at 18.5 cm craniocaudal. Suspect mild hepatic steatosis. Too small to characterize right hepatic lobe lesion. Normal gallbladder, without biliary  ductal dilatation. Pancreas: Normal, without mass or ductal dilatation. Spleen: Normal in size, without focal abnormality. Adrenals/Urinary Tract: Normal adrenal glands. At least partially duplicated left renal collecting system. Mild right-sided hydroureteronephrosis. This continues to the level of the pelvic brim. A calcification in this region measures 4 mm, including on image 75/ series 7. Difficult to differentiate if this is vascular or within the ureter. The distal ureter is normal in caliber. Normal urinary bladder. Stomach/Bowel: Normal remainder of the stomach. Scattered colonic diverticula. Colonic stool burden suggests constipation. Normal terminal ileum. Appendectomy. Normal small bowel. Vascular/Lymphatic: Aortic and branch vessel atherosclerosis. No abdominopelvic adenopathy. Reproductive: Hysterectomy.  No adnexal mass. Other: No significant free fluid. Musculoskeletal: Lumbosacral spondylosis with disc bulge at L4-5. IMPRESSION: 1. Mild right-sided hydroureteronephrosis. This continues to the level of a calcification which is either within or adjacent (vascular) to the mid to distal right ureter. Given size transition in this area, favored to represent a ureteric stone. 2.  Possible constipation. 3.  Aortic Atherosclerosis (ICD10-I70.0). 4. Hepatomegaly and probable mild hepatic steatosis. Electronically Signed   By: Abigail Miyamoto M.D.   On: 07/13/2016 16:27     Labs:   Basic Metabolic Panel:  Recent Labs Lab 07/13/16 1420 07/14/16 0638 07/15/16 0409 07/17/16 0627  NA 140 142 138 139  K 3.5 3.7 3.3* 3.8  CL 109 111 107 108  CO2 23 23 23 26   GLUCOSE 113* 94 88 97  BUN 10 7 9 10   CREATININE 0.99 1.01* 1.04* 0.91  CALCIUM 9.2 8.8* 9.0 9.0  MG 2.4  --   --   --   PHOS 2.3*  --   --   --    GFR  Estimated Creatinine Clearance: 39.7 mL/min (by C-G formula based on SCr of 0.91 mg/dL). Liver Function Tests:  Recent Labs Lab 07/13/16 1420  AST 25  ALT 16  ALKPHOS 110  BILITOT  0.8  PROT 6.8  ALBUMIN 4.2   No results for input(s): LIPASE, AMYLASE in the last 168 hours. No results for input(s): AMMONIA in the last 168 hours. Coagulation profile No results for input(s): INR, PROTIME in the last 168 hours.  CBC:  Recent Labs Lab 07/13/16 1420 07/13/16 1907 07/14/16 0638 07/15/16 0409  WBC 8.2  --  5.8 6.5  NEUTROABS 5.7  --   --   --   HGB 14.4  --  13.2 13.3  HCT 43.9 41.2 41.3 41.3  MCV 94.2  --  97.9 95.6  PLT 213  --  210 191   Cardiac Enzymes: No results for input(s): CKTOTAL, CKMB, CKMBINDEX, TROPONINI in the last 168 hours. BNP: Invalid input(s): POCBNP CBG: No results for input(s): GLUCAP in the last 168 hours. D-Dimer No results for input(s): DDIMER in the last 72 hours. Hgb A1c No results for input(s): HGBA1C in the last 72 hours. Lipid Profile No results for input(s): CHOL, HDL, LDLCALC, TRIG, CHOLHDL, LDLDIRECT in the last 72 hours. Thyroid function studies No results for input(s): TSH, T4TOTAL, T3FREE, THYROIDAB in the last 72 hours.  Invalid input(s): FREET3 Anemia work up  Recent Labs  07/14/16 1153  [place]   Microbiology Recent Results (from the past 240 hour(s))  Urine culture     Status: Abnormal   Collection Time: 07/13/16  1:40 PM  Result Value Ref Range Status   Specimen Description URINE, RANDOM  Final   Special Requests NONE  Final   Culture MULTIPLE ORGANISMS PRESENT, NONE PREDOMINANT (A)  Final   Report Status 07/15/2016 FINAL  Final  MRSA PCR Screening     Status: None   Collection Time: 07/13/16  9:13 PM  Result Value Ref Range Status   MRSA by PCR NEGATIVE NEGATIVE Final    Comment:        The GeneXpert MRSA Assay (FDA approved for NASAL specimens only), is one component of a comprehensive MRSA colonization surveillance program. It is not intended to diagnose MRSA infection nor to guide or monitor treatment for MRSA infections.      Discharge Instructions:   Discharge Instructions      Diet - low sodium heart healthy    Complete by:  As directed    Increase activity slowly    Complete by:  As directed      Allergies as of 07/17/2016      Reactions   Hydrocodone Nausea And Vomiting   Morphine And Related Nausea And Vomiting   Actonel [risedronate Sodium] Other (See Comments)   unknown   Darvon [propoxyphene Hcl] Other (See Comments)   unknown   Oxycodone Hcl Other (See Comments)   unknown   Pneumovax [pneumococcal Polysaccharide Vaccine] Other (See Comments)   unknown      Medication List    STOP taking these medications   UNABLE TO FIND     TAKE these medications   acetaminophen 325 MG tablet Commonly known as:  TYLENOL Take 650 mg by mouth every 4 (four) hours as needed for pain.   atorvastatin 10 MG tablet Commonly known as:  LIPITOR Take 10 mg by mouth daily.   bisacodyl 10 MG suppository Commonly known as:  DULCOLAX Place 1 suppository (10 mg total) rectally daily as needed for moderate  constipation.   CALTRATE 600+D 600-400 MG-UNIT tablet Generic drug:  Calcium Carbonate-Vitamin D Take 1 tablet by mouth 2 (two) times daily.   feeding supplement (ENSURE ENLIVE) Liqd Take 237 mLs by mouth daily at 2 PM.   fluticasone 50 MCG/ACT nasal spray Commonly known as:  FLONASE Place 1 spray into both nostrils as needed for allergies.   GAS RELIEF 80 PO Take 80 mg by mouth 4 (four) times daily - after meals and at bedtime. Take one tablet after meals and at bedtime for gas relief   hydrocortisone 2.5 % cream Apply 1 application topically 2 (two) times daily. For itchy rash   I-VITE Tabs Take 1 tablet by mouth daily.   metoprolol tartrate 12.5 mg Tabs tablet Commonly known as:  LOPRESSOR Take 12.5 mg by mouth 2 (two) times daily. For HTN   mirtazapine 15 MG tablet Commonly known as:  REMERON Take 1 tablet (15 mg total) by mouth at bedtime. What changed:  medication strength  how much to take  additional instructions   nystatin  100000 UNIT/ML suspension Commonly known as:  MYCOSTATIN Take 5 mLs (500,000 Units total) by mouth 4 (four) times daily.   nystatin powder Generic drug:  nystatin Apply topically daily as needed. Under left breast   nystatin cream Commonly known as:  MYCOSTATIN Apply 1 application topically daily as needed for dry skin.   omeprazole 20 MG capsule Commonly known as:  PRILOSEC Take 20 mg by mouth daily.   polyethylene glycol packet Commonly known as:  MIRALAX / GLYCOLAX Take 17 g by mouth daily.   QUEtiapine 25 MG tablet Commonly known as:  SEROQUEL Take 1 tablet (25 mg total) by mouth 3 (three) times daily.   Rivaroxaban 15 MG Tabs tablet Commonly known as:  XARELTO Take 1 tablet (15 mg total) by mouth daily at 6 PM. What changed:  medication strength  how much to take  when to take this  additional instructions   sertraline 100 MG tablet Commonly known as:  ZOLOFT Take 1 tablet (100 mg total) by mouth daily at 6 PM. What changed:  medication strength  how much to take  when to take this   vitamin B-12 1000 MCG tablet Commonly known as:  CYANOCOBALAMIN Take 1 tablet (1,000 mcg total) by mouth daily.      Follow-up Information    Estill Dooms, MD Follow up in 1 week(s).   Specialty:  Internal Medicine Contact information: [place] [address] [phone]            Time coordinating discharge: 35 min  Signed:  Eastyn Dattilo U Jaanai Salemi   [facility] 07/17/2016, 9:34 AM

## 2016-07-17 NOTE — Evaluation (Signed)
Occupational Therapy Evaluation Patient Details Name: Erica [name] MRN: [id number] DOB: 10/17/1927 Today's Date: 07/17/2016    History of Present Illness Erica [name] is a 83 y.o. female with a Past Medical History significant for hypertension, stroke, constipation who presents with acute encephalopathy; found to have UTI   Clinical Impression   Pt admitted with above listed diagnosis.  She demonstrates the below listed deficits.  Currently, she requires min - mod A for ADLs.  She previously resided at ALF at [facility].  She is unable to provide her PLOF, and no family present.  She will definitely benefit from follow up OT at discharge.       Follow Up Recommendations  SNF with transition back to ALF once she is deemed safe to return.    Equipment Recommendations  None recommended by OT    Recommendations for Other Services       Precautions / Restrictions Precautions Precautions: Fall Restrictions Weight Bearing Restrictions: No      Mobility Bed Mobility               General bed mobility comments: sitting up in recliner   Transfers Overall transfer level: Needs assistance Equipment used: 1 person hand held assist Transfers: Sit to/from Stand;Stand Pivot Transfers Sit to Stand: Min assist Stand pivot transfers: Min assist       General transfer comment: min A for balance and to guide pt     Balance Overall balance assessment: Needs assistance Sitting-balance support: Feet supported Sitting balance-Leahy Scale: Fair     Standing balance support: Single extremity supported Standing balance-Leahy Scale: Poor Standing balance comment: requires UE support                            ADL either performed or assessed with clinical judgement   ADL Overall ADL's : Needs assistance/impaired Eating/Feeding: Supervision/ safety;Set up;Sitting   Grooming: Wash/dry hands;Wash/dry face;Brushing hair;Min guard;Standing   Upper Body Bathing:  Moderate assistance;Sitting Upper Body Bathing Details (indicate cue type and reason): for thoroughness  Lower Body Bathing: Moderate assistance;Sit to/from stand Lower Body Bathing Details (indicate cue type and reason): for thoroughness  Upper Body Dressing : Minimal assistance;Sitting   Lower Body Dressing: Minimal assistance;Sit to/from stand   Toilet Transfer: Minimal assistance;Ambulation;Comfort height toilet;Grab bars Toilet Transfer Details (indicate cue type and reason): min A for balance  Toileting- Clothing Manipulation and Hygiene: Minimal assistance;Sit to/from stand Toileting - Clothing Manipulation Details (indicate cue type and reason): min A for thoroughness and clothing manipulation      Functional mobility during ADLs: Minimal assistance       Vision Baseline Vision/History: Wears glasses Wears Glasses: At all times       Perception     Praxis      Pertinent Vitals/Pain Pain Assessment: Faces Faces Pain Scale: Hurts a little bit Pain Location: Rt thigh  Pain Descriptors / Indicators: Grimacing Pain Intervention(s): Monitored during session     Hand Dominance Right   Extremity/Trunk Assessment Upper Extremity Assessment Upper Extremity Assessment: Generalized weakness   Lower Extremity Assessment Lower Extremity Assessment: Defer to PT evaluation   Cervical / Trunk Assessment Cervical / Trunk Assessment: Normal   Communication Communication Communication: No difficulties   Cognition Arousal/Alertness: Awake/alert Behavior During Therapy: WFL for tasks assessed/performed Overall Cognitive Status: No family/caregiver present to determine baseline cognitive functioning  General Comments: Pt pleasantly confused.  She demonstrates difficulty figuring out how to negotiate environment and how to open door    General Comments       Exercises     Shoulder Instructions      Home Living  Family/patient expects to be discharged to:: Assisted living                             Home Equipment: Gilford Rile - 2 wheels   Additional Comments: Per H&P: walks with RW, needs prompting for ADLs      Prior Functioning/Environment Level of Independence: Needs assistance  Gait / Transfers Assistance Needed: with RW     Comments: unsure of PLOF as pt unable to provide info         OT Problem List: Decreased strength;Decreased activity tolerance;Impaired balance (sitting and/or standing);Decreased cognition;Decreased safety awareness;Decreased knowledge of use of DME or AE      OT Treatment/Interventions:      OT Goals(Current goals can be found in the care plan section) Acute Rehab OT Goals OT Goal Formulation: All assessment and education complete, DC therapy  OT Frequency:     Barriers to D/C:            Co-evaluation              AM-PAC PT "6 Clicks" Daily Activity     Outcome Measure Help from another person eating meals?: A Little Help from another person taking care of personal grooming?: A Little Help from another person toileting, which includes using toliet, bedpan, or urinal?: A Little Help from another person bathing (including washing, rinsing, drying)?: A Lot Help from another person to put on and taking off regular upper body clothing?: A Little Help from another person to put on and taking off regular lower body clothing?: A Little 6 Click Score: 17   End of Session Nurse Communication: Mobility status  Activity Tolerance: Patient tolerated treatment well Patient left: in chair;with call bell/phone within reach;with chair alarm set  OT Visit Diagnosis: Unsteadiness on feet (R26.81);Cognitive communication deficit (R41.841)                Time: 7614-7092 OT Time Calculation (min): 20 min Charges:  OT General Charges $OT Visit: 1 Procedure OT Evaluation $OT Eval Moderate Complexity: 1 Procedure G-Codes:     Lucille Passy,  OTR/L [phone]   Lucille Passy M 07/17/2016, 11:51 AM

## 2016-07-17 NOTE — Clinical Social Work Note (Signed)
Clinical Social Work Assessment  Patient Details  Name: Erica [name] MRN: [id number] Date of Birth: May 15, 1927  Date of referral:  07/17/16               Reason for consult:  Facility Placement                Permission sought to share information with:  Chartered certified accountant granted to share information::  Yes, Verbal Permission Granted  Name::     Charity fundraiser::  SNF-[facility]  Relationship::     Contact Information:     Housing/Transportation Living arrangements for the past 2 months:  [place] of Information:  Patient Patient Interpreter Needed:  None Criminal Activity/Legal Involvement Pertinent to Current Situation/Hospitalization:  No - Comment as needed Significant Relationships:  Adult Children Lives with:  Facility Resident Do you feel safe going back to the place where you live?  Yes Need for family participation in patient care:  No (Coment)  Care giving concerns:  Patient from [facility] ALF and has resided there for some time. Pt had a fall and needed higher level of care. Pt can return to SNF at DC as the facility provided short term rehab to its residents.  CSW spoke with Joellen Jersey in admissions to confirm.  CSW discussed the plan at DC to return to SNF and daughter, [name], was in agreement.  Social Worker assessment / plan:  CSW will update FL2 and submit to SNF per Katie's request. CSW explained her role to SNF and daughter. CSW will assist with dc back to SNF for further rehabilitation.  Employment status:  Retired Nurse, adult PT Recommendations:  [place] / Referral to community resources:  [place]  Patient/Family's Response to care:  Family appreciative of CSW assistance. Family reports no issues or concerns at this time.  Patient/Family's Understanding of and Emotional Response to Diagnosis, Current Treatment, and  Prognosis:  Family has good understanding of diagnosis, current treatment and prognosis and is hopeful that patient will return to baseline. No issues or concerns at this time.  Emotional Assessment Appearance:  Appears stated age Attitude/Demeanor/Rapport:   (Cooperative) Affect (typically observed):  Accepting, Appropriate Orientation:  Oriented to Self Alcohol / Substance use:  Not Applicable Psych involvement (Current and /or in the community):  No (Comment)  Discharge Needs  Concerns to be addressed:  Care Coordination Readmission within the last 30 days:  No Current discharge risk:  Cognitively Impaired, Dependent with Mobility Barriers to Discharge:  No Barriers Identified   Normajean Baxter, LCSW 07/17/2016, 12:25 PM

## 2016-07-17 NOTE — Social Work (Signed)
Clinical Social Worker facilitated patient discharge including contacting patient family and facility to confirm patient discharge plans.  Clinical information faxed to facility and family agreeable with plan.    Pt will be transported by daughter.  RN to call [phone] to give report prior to discharge.  Clinical Social Worker will sign off for now as social work intervention is no longer needed. Please consult [place] again if new need arises.  Elissa Hefty, LCSW Clinical Social Worker [phone]

## 2016-07-17 NOTE — Telephone Encounter (Signed)
Possible re-admission to facility. This is a patient you were seeing at [facility]. [facility] F/U is needed if patient was re-admitted to facility upon discharge. Hospital discharge from [facility] on 07/17/16

## 2016-07-17 NOTE — [place] FL2 (Signed)
[place] MEDICAID FL2 LEVEL OF CARE SCREENING TOOL     IDENTIFICATION  Patient Name: Erica [name] Birthdate: August 25, 1927 Sex: female Admission Date (Current Location): 07/13/2016  [facility] and [place] Number:  Herbalist and Address:  [facility], [place] [address] [place], [place] [address]      Provider Number: [id number]  Attending Physician Name and Address:  Geradine Girt, DO  Relative Name and Phone Number:  [place], [place] - Daughter;  [phone] (h), [phone] (work), [phone] (mobile)     Current Level of Care: Hospital Recommended Level of Care:   Prior Approval Number:    Date Approved/Denied:   PASRR Number: [id number]   (Eff. 12/05/11)  Discharge Plan: SNF    Current Diagnoses: Patient Active Problem List   Diagnosis Date Noted  . Acute encephalopathy 07/13/2016  . Hydroureteronephrosis   . Dog bite of arm, right, initial encounter 07/07/2016  . Viral upper respiratory illness 03/06/2016  . Hypertension   . Stroke ([place])   . Dysphagia following cerebrovascular disease   . Aphasia following unspecified cerebrovascular disease   . 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 Nontraumatic intracranial hemorrhage ([place])   . 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   . Neuralgia and neuritis, unspecified ([place])   . Major depressive disorder, recurrent ([place])   . Rash and nonspecific skin eruption 10/31/2015  . UTI (urinary tract infection) 07/26/2015  . Abdominal bloating 04/26/2015  . Constipation 01/29/2015  . RUQ pain 12/14/2014  . Abdominal pain, epigastric 10/04/2014  . TMJ tenderness 04/20/2014  . Mass of left breast on mammogram 12/15/2013  . Sinusitis, chronic 11/30/2013  . Solitary bone cyst of left forearm 11/10/2013  . SCC (squamous cell carcinoma), scalp/neck 11/10/2013  . Shingles 11/24/2012  . GERD (gastroesophageal reflux disease) 09/29/2012  . Protein C deficiency  ([place]) 06/14/2012  . Protein S deficiency ([place]) 06/14/2012  . DNR (do not resuscitate) 06/14/2012  . 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 Weight gain 05/10/2012  . Essential hypertension, benign 03/24/2012  . Depression 03/24/2012  . DVT (deep venous thrombosis) ([place]) 03/24/2012  . Dysphagia 03/24/2012  . CVA (cerebral vascular accident) ([place]) 03/24/2012  . Osteoporosis 03/24/2012  . Macular degeneration 03/24/2012  . Aphasia 03/24/2012  . Insomnia 03/24/2012  . Dyslipidemia 03/24/2012    Orientation RESPIRATION BLADDER Height & Weight     Self  Normal Continent Weight: 145 lb 4.8 oz (65.9 kg) Height:  5\' 3"  (160 cm)  BEHAVIORAL SYMPTOMS/MOOD NEUROLOGICAL BOWEL NUTRITION STATUS      Continent Diet (Heart healthy)  AMBULATORY STATUS COMMUNICATION OF NEEDS Skin   Extensive Assist Verbally Normal                       Personal Care Assistance Level of Assistance  Bathing, Feeding, Dressing Bathing Assistance: Maximum assistance Feeding assistance: Limited assistance Dressing Assistance: Maximum assistance     Functional Limitations Info  Sight, Hearing, Speech Sight Info: Adequate Hearing Info: Adequate Speech Info: Adequate    SPECIAL CARE FACTORS FREQUENCY  PT (By licensed PT), OT (By licensed OT)     PT Frequency: 5xweek OT Frequency: 5xweek            Contractures Contractures Info: Not present    Additional Factors Info  Code Status, Allergies Code Status Info: DNR Allergies Info: Hydrocodone, Morphine and related, Actonel, Darvon, Oxycodone Hcl, Pneumovax  Current Medications (07/17/2016):  This is the current hospital active medication list Current Facility-Administered Medications  Medication Dose Route Frequency Provider Last Rate Last Dose  . acetaminophen (TYLENOL) tablet 650 mg  650 mg Oral Q4H PRN Radene Gunning, NP      . atorvastatin (LIPITOR) tablet 10 mg  10 mg Oral Q2000 Radene Gunning, NP   10 mg at 07/16/16 2203  .  bisacodyl (DULCOLAX) suppository 10 mg  10 mg Rectal Daily PRN Black, Lezlie Octave, NP      . calcium-vitamin D (OSCAL WITH D) 500-200 MG-UNIT per tablet 1 tablet  1 tablet Oral BID Radene Gunning, NP   1 tablet at 07/17/16 1055  . cefTRIAXone (ROCEPHIN) 1 g in dextrose 5 % 50 mL IVPB  1 g Intravenous Q24H Eulogio Bear U, DO   Stopped at 07/16/16 2328  . feeding supplement (ENSURE ENLIVE) (ENSURE ENLIVE) liquid 237 mL  237 mL Oral Q1400 Vann, Jessica U, DO   237 mL at 07/15/16 1738  . fluticasone (FLONASE) 50 MCG/ACT nasal spray 1 spray  1 spray Each Nare PRN Radene Gunning, NP      . metoprolol tartrate (LOPRESSOR) tablet 12.5 mg  12.5 mg Oral BID Dyanne Carrel M, NP   12.5 mg at 07/17/16 1055  . mirtazapine (REMERON) tablet 15 mg  15 mg Oral QHS Ambrose Finland, MD   15 mg at 07/16/16 2203  . nystatin (MYCOSTATIN) 100000 UNIT/ML suspension 500,000 Units  5 mL Oral QID Eulogio Bear U, DO   500,000 Units at 07/16/16 2203  . ondansetron (ZOFRAN) tablet 4 mg  4 mg Oral Q6H PRN Radene Gunning, NP       Or  . ondansetron [facility]) injection 4 mg  4 mg Intravenous Q6H PRN Black, Karen M, NP      . pantoprazole (PROTONIX) EC tablet 40 mg  40 mg Oral Daily Radene Gunning, NP   40 mg at 07/17/16 1055  . polyethylene glycol (MIRALAX / GLYCOLAX) packet 17 g  17 g Oral Daily Radene Gunning, NP   17 g at 07/16/16 1039  . QUEtiapine (SEROQUEL) tablet 25 mg  25 mg Oral TID Ambrose Finland, MD   25 mg at 07/17/16 1055  . Rivaroxaban (XARELTO) tablet 15 mg  15 mg Oral q1800 Radene Gunning, NP   15 mg at 07/16/16 1756  . sertraline (ZOLOFT) tablet 100 mg  100 mg Oral q1800 Ambrose Finland, MD   100 mg at 07/16/16 1756     Discharge Medications: Please see discharge summary for a list of discharge medications.  Relevant Imaging Results:  Relevant Lab Results:   Additional Information ss#[id number]  Normajean Baxter, LCSW

## 2016-07-17 NOTE — Progress Notes (Signed)
Patient discharged to [facility] with daughter. Patients belongings with daughter. All discharge instructions set up with [facility]. Patient left unit in stable condition.  Sheliah Plane RN

## 2016-07-17 NOTE — Clinical Social Work Placement (Signed)
   CLINICAL SOCIAL WORK PLACEMENT  NOTE  Date:  07/17/2016  Patient Details  Name: Erica [name] MRN: [id number] Date of Birth: [birth year]  Clinical Social Work is seeking post-discharge placement for this patient at the [place] level of care (*CSW will initial, date and re-position this form in  chart as items are completed):  Yes   Patient/family provided with [place] Work Department's list of facilities offering this level of care within the geographic area requested by the patient (or if unable, by the patient's family).  Yes   Patient/family informed of their freedom to choose among providers that offer the needed level of care, that participate in Medicare, Medicaid or managed care program needed by the patient, have an available bed and are willing to accept the patient.  Yes   Patient/family informed of [facility]'s ownership interest in [facility] and [facility], as well as of the fact that they are under no obligation to receive care at these facilities.  PASRR submitted to EDS on       PASRR number received on 07/16/16     Existing PASRR number confirmed on 07/16/16     FL2 transmitted to all facilities in geographic area requested by pt/family on       FL2 transmitted to all facilities within larger geographic area on 07/17/16     Patient informed that his/her managed care company has contracts with or will negotiate with certain facilities, including the following:        Yes   Patient/family informed of bed offers received.  Patient chooses bed at [facility]     Physician recommends and patient chooses bed at      Patient to be transferred to [facility] on 07/17/16.  Patient to be transferred to facility by family     Patient family notified on 07/17/16 of transfer.  Name of family member notified:  daughter     PHYSICIAN Please prepare priority discharge summary, including medications,  Please prepare prescriptions, Please sign DNR     Additional Comment:    _______________________________________________ Normajean Baxter, LCSW 07/17/2016, 12:24 PM

## 2016-07-18 ENCOUNTER — Encounter: Payer: Self-pay | Admitting: Nurse Practitioner

## 2016-07-18 ENCOUNTER — Non-Acute Institutional Stay (SKILLED_NURSING_FACILITY): Payer: Medicare Other | Admitting: Nurse Practitioner

## 2016-07-18 DIAGNOSIS — I82402 Acute embolism and thrombosis of unspecified deep veins of left lower extremity: Secondary | ICD-10-CM | POA: Diagnosis not present

## 2016-07-18 DIAGNOSIS — B370 Candidal stomatitis: Secondary | ICD-10-CM

## 2016-07-18 DIAGNOSIS — K5900 Constipation, unspecified: Secondary | ICD-10-CM

## 2016-07-18 DIAGNOSIS — I6300 Cerebral infarction due to thrombosis of unspecified precerebral artery: Secondary | ICD-10-CM | POA: Diagnosis not present

## 2016-07-18 DIAGNOSIS — K219 Gastro-esophageal reflux disease without esophagitis: Secondary | ICD-10-CM

## 2016-07-18 DIAGNOSIS — I10 Essential (primary) hypertension: Secondary | ICD-10-CM | POA: Diagnosis not present

## 2016-07-18 DIAGNOSIS — B379 Candidiasis, unspecified: Secondary | ICD-10-CM | POA: Insufficient documentation

## 2016-07-18 DIAGNOSIS — R140 Abdominal distension (gaseous): Secondary | ICD-10-CM | POA: Diagnosis not present

## 2016-07-18 DIAGNOSIS — F339 Major depressive disorder, recurrent, unspecified: Secondary | ICD-10-CM | POA: Diagnosis not present

## 2016-07-18 DIAGNOSIS — G9340 Encephalopathy, unspecified: Secondary | ICD-10-CM | POA: Diagnosis not present

## 2016-07-18 NOTE — Assessment & Plan Note (Signed)
Mood is stable, continue Sertraline 100mg , Mirtazapine 15mg 

## 2016-07-18 NOTE — Assessment & Plan Note (Signed)
acute encephalopathy with visual hallucinations, Vit B12 po started in setting of lower end of normal,  Seroquel started, Mirtazapine/Sertraline were increased  to manage her behaviors per psych consultation, tolerated so far. Observe the patient, prn onsite psych evaluation

## 2016-07-18 NOTE — Assessment & Plan Note (Signed)
Noted heavy white coated tongue, complete 2 weeks of Nystatin swish and swallow.

## 2016-07-18 NOTE — Assessment & Plan Note (Addendum)
Presentation: bloated, abd pain, continue  MiraLax dialy, prn suppository

## 2016-07-18 NOTE — Progress Notes (Signed)
Location:  [facility] Room Number: 28 Place of Service:  SNF (31) Provider:  Mast, Manxie  NP  Blanchie Serve, MD  Patient Care Team: Blanchie Serve, MD as PCP - General (Internal Medicine) Mast, Man X, NP as Nurse Practitioner (Internal Medicine) Juluis Rainier as Consulting Physician (Optometry) Monna Fam, MD as Consulting Physician (Ophthalmology)  Extended Emergency Contact Information Primary Emergency Contact: [facility] Address: [address], [place] [address] [name] of [place] Phone: [phone] Work Phone: [phone] Mobile Phone: [phone] Relation: Daughter  Code Status:  DNR Goals of care: Advanced Directive information Advanced Directives 07/18/2016  Does Patient Have a Medical Advance Directive? Yes  Type of Paramedic of [place];Living will;Out of facility DNR (pink MOST or yellow form)  Does patient want to make changes to medical advance directive? No - Patient declined  Copy of [place] in Chart? Yes  Pre-existing out of facility DNR order (yellow form or pink MOST form) Yellow form placed in chart (order not valid for inpatient use)     Chief Complaint  Patient presents with  . Acute Visit    F/u-hospital    HPI:  Pt is a 83 y.o. female seen today for an acute visit for f/u hospital stay 07/13/16 to 07/17/16 for acute encephalopathy with visual hallucinations, Vit B12 po started in setting of lower end of normal,  Seroquel started, Mirtazapine15/7.5mg  /Sertraline 100/75mg  were increased  to manage her behaviors per psych consultation, tolerated so far. UTI was fully treated with 5 day course of Rocephin.   Hx of constipation, managed with  Miralax, prn suppository, HTN blood pressure is controlled on Metoprolol, Hx of CVA/DVT with Protein C/S def, she has mild left sided weakness and dysarthria, on Xarelto. Ritta Slot is treated with  Nystatin S/S   Past Medical History:  Diagnosis Date  . Acute encephalopathy   . Aphasia following unspecified cerebrovascular disease   . 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 Depression   . Dysphagia following cerebrovascular disease   . Hepatic steatosis   . Hydroureteronephrosis   . Hypertension   . Insomnia   . Macular degeneration   . Major depressive disorder, recurrent ([place])   . Neuralgia and neuritis, unspecified ([place])   . Nontraumatic intracranial hemorrhage ([place])   . Stroke ([place])   . 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   . 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 ABDOMINAL HYSTERECTOMY    . APPENDECTOMY    . BREAST SURGERY    . MASTECTOMY Right     Allergies  Allergen Reactions  . Hydrocodone Nausea And Vomiting  . Morphine And Related Nausea And Vomiting  . Actonel [Risedronate Sodium] Other (See Comments)    unknown  . Darvon [Propoxyphene Hcl] Other (See Comments)    unknown  . Oxycodone Hcl Other (See Comments)    unknown  . Pneumovax [Pneumococcal Polysaccharide Vaccine] Other (See Comments)    unknown    Outpatient Encounter Prescriptions as of 07/18/2016  Medication Sig  . acetaminophen (TYLENOL) 325 MG tablet Take 650 mg by mouth every 4 (four) hours as needed for pain.  Marland Kitchen atorvastatin (LIPITOR) 10 MG tablet Take 10 mg by mouth daily.  . bisacodyl (DULCOLAX) 10 MG suppository Place 1 suppository (10 mg  total) rectally daily as needed for moderate constipation.  . Calcium Carbonate-Vitamin D (CALTRATE 600+D) 600-400 MG-UNIT tablet Take 1 tablet by mouth 2 (two) times daily.  . feeding supplement, ENSURE ENLIVE, (ENSURE ENLIVE) LIQD Take 237 mLs by mouth daily at 2 PM.  . fluticasone (FLONASE) 50 MCG/ACT nasal spray Place 1 spray into both nostrils as needed for allergies.   . metoprolol tartrate (LOPRESSOR) 12.5 mg TABS  tablet Take 12.5 mg by mouth 2 (two) times daily. For HTN  . mirtazapine (REMERON) 15 MG tablet Take 1 tablet (15 mg total) by mouth at bedtime.  . Multiple Vitamins-Minerals (I-VITE) TABS Take 1 tablet by mouth daily.  Marland Kitchen nystatin (MYCOSTATIN) 100000 UNIT/ML suspension Take 5 mLs (500,000 Units total) by mouth 4 (four) times daily.  Marland Kitchen nystatin (NYSTATIN) powder Apply topically daily as needed. Under left breast   . omeprazole (PRILOSEC) 20 MG capsule Take 20 mg by mouth daily.  . polyethylene glycol (MIRALAX / GLYCOLAX) packet Take 17 g by mouth daily.  . QUEtiapine (SEROQUEL) 25 MG tablet Take 1 tablet (25 mg total) by mouth 3 (three) times daily.  . Rivaroxaban (XARELTO) 15 MG TABS tablet Take 1 tablet (15 mg total) by mouth daily at 6 PM.  . sertraline (ZOLOFT) 100 MG tablet Take 1 tablet (100 mg total) by mouth daily at 6 PM.  . Simethicone (GAS RELIEF 80 PO) Take 80 mg by mouth 4 (four) times daily - after meals and at bedtime. Take one tablet after meals and at bedtime for gas relief   . vitamin B-12 (CYANOCOBALAMIN) 1000 MCG tablet Take 1 tablet (1,000 mcg total) by mouth daily.  . [DISCONTINUED] nystatin cream (MYCOSTATIN) Apply 1 application topically daily as needed for dry skin.  . [DISCONTINUED] hydrocortisone 2.5 % cream Apply 1 application topically 2 (two) times daily. For itchy rash   No facility-administered encounter medications on file as of 07/18/2016.     Review of Systems  Constitutional: Negative.  Negative for chills, diaphoresis and fever.  HENT: Positive for hearing loss. Negative for congestion and ear discharge.   Respiratory: Negative for cough and wheezing.   Cardiovascular: Negative for chest pain, palpitations and leg swelling.  Gastrointestinal: Negative for blood in stool, constipation, diarrhea, nausea and vomiting.       Epigastric pain in the past, Omeprazole helped  Genitourinary: Positive for frequency. Negative for dysuria, flank pain, hematuria and  urgency.  Musculoskeletal: Positive for gait problem. Negative for back pain, myalgias and neck pain.       Left breast pain travels to the right, gravity worsens pain when she stands up-resolved  Skin: Positive for wound. Negative for rash.       The right mastectomy.  A small about 1cm size of dog bit wound @ dorsum of the right wrist, no s/s of infection.   Neurological: Negative for dizziness, tremors, seizures, weakness and headaches.       History of CVA 2013. Residual problems of dysarthria and right hemiparesis.  Hematological: Bruises/bleeds easily.  Psychiatric/Behavioral: Positive for confusion and decreased concentration. Negative for hallucinations. The patient is not nervous/anxious.     Immunization History  Administered Date(s) Administered  . Influenza-Unspecified 11/20/2013, 09/26/2014, 10/24/2015   Pertinent  Health Maintenance Due  Topic Date Due  . DEXA SCAN  07/18/1994  . PNA vac Low Risk Adult (1 of 2 - PCV13) 07/18/1994  . INFLUENZA VACCINE  08/06/2016   No flowsheet data found. Functional Status Survey:    Vitals:  07/18/16 1221  BP: (!) 150/82  Pulse: 70  Resp: 18  Temp: (!) 97.4 F (36.3 C)  SpO2: 94%  Weight: 145 lb (65.8 kg)  Height: 5\' 3"  (1.6 m)   Body mass index is 25.69 kg/m. Physical Exam  Constitutional: She appears well-developed.  HENT:  Head: Normocephalic and atraumatic.  Left Ear: External ear normal.  Nose: Mucosal edema and rhinorrhea present. Right sinus exhibits no maxillary sinus tenderness and no frontal sinus tenderness. Left sinus exhibits no maxillary sinus tenderness and no frontal sinus tenderness.  Mouth/Throat: Oropharynx is clear and moist. Mucous membranes are not pale, not dry and not cyanotic. No oropharyngeal exudate.  While coated tongue  Eyes: Pupils are equal, round, and reactive to light. Conjunctivae and EOM are normal. Right eye exhibits no discharge. Left eye exhibits no discharge.  Neck: Normal range of  motion. Neck supple.  Cardiovascular: Normal rate, regular rhythm and normal heart sounds.  Exam reveals no friction rub.   No murmur heard. Pulmonary/Chest: Effort normal and breath sounds normal. No respiratory distress. She has no wheezes. She has no rales.  Abdominal: Soft. Bowel sounds are normal.     Genitourinary: Vagina normal.  Musculoskeletal: She exhibits no edema or tenderness.       Right shoulder: She exhibits decreased strength.  Right sided paralysis with muscle strength 4/5  Neurological: She is alert. She displays abnormal reflex. No cranial nerve deficit. She exhibits abnormal muscle tone. Coordination abnormal.  Dysarthria. Right hemiparesis. Dementia.  Skin: Skin is warm and dry. No rash noted.   A small about 1cm size of dog bit wound @ dorsum of the right wrist, no s/s of infection.   Psychiatric: Her mood appears not anxious. Her affect is not angry, not blunt, not labile and not inappropriate. Her speech is delayed and tangential. Her speech is not rapid and/or pressured and not slurred. She is slowed. She is not agitated, not aggressive, not hyperactive, not withdrawn, not actively hallucinating and not combative. Thought content is not paranoid and not delusional. Cognition and memory are impaired. She does not express impulsivity or inappropriate judgment. She does not exhibit a depressed mood. She is communicative. She exhibits abnormal recent memory. She exhibits normal remote memory.  Mild anxiety. She is attentive.    Labs reviewed:  Recent Labs  07/13/16 1420 07/14/16 0638 07/15/16 0409 07/17/16 0627 07/24/16  NA 140 142 138 139 142  K 3.5 3.7 3.3* 3.8 3.6  CL 109 111 107 108  --   CO2 23 23 23 26   --   GLUCOSE 113* 94 88 97  --   BUN 10 7 9 10 11   CREATININE 0.99 1.01* 1.04* 0.91 1.1  CALCIUM 9.2 8.8* 9.0 9.0  --   MG 2.4  --   --   --   --   PHOS 2.3*  --   --   --   --     Recent Labs  08/02/15 07/13/16 1420 07/24/16  AST 20 25 15   ALT 16  16 13   ALKPHOS 107 110 92  BILITOT  --  0.8  --   PROT  --  6.8  --   ALBUMIN  --  4.2  --     Recent Labs  07/13/16 1420  07/14/16 0638 07/15/16 0409 07/24/16  WBC 8.2  --  5.8 6.5 5.3  NEUTROABS 5.7  --   --   --   --   HGB 14.4  --  13.2  13.3 13.1  HCT 43.9  < > 41.3 41.3 39  MCV 94.2  --  97.9 95.6  --   PLT 213  --  210 191 222  < > = values in this interval not displayed. Lab Results  Component Value Date   TSH 1.57 12/25/2015   Lab Results  Component Value Date   HGBA1C 5.5 08/02/2015   Lab Results  Component Value Date   CHOL 193 12/25/2015   HDL 45 12/25/2015   LDLCALC 112 12/25/2015   TRIG 182 (A) 12/25/2015    Significant Diagnostic Results in last 30 days:  Dg Chest 2 View  Result Date: 07/13/2016 CLINICAL DATA:  Patient very confused and unable to provide history. Hallucinations and possible UTI. EXAM: CHEST  2 VIEW COMPARISON:  08/20/2014. FINDINGS: Low volume film. Cardiopericardial silhouette is at upper limits of normal for size. There is pulmonary vascular congestion without overt pulmonary edema. Subsegmental atelectasis noted left lung base. No pleural effusion. The visualized bony structures of the thorax are intact. IMPRESSION: Low lung volumes with borderline cardiomegaly and vascular congestion. Electronically Signed   By: Misty Stanley M.D.   On: 07/13/2016 14:54   Ct Head Wo Contrast  Result Date: 07/13/2016 CLINICAL DATA:  Hallucination, confusion. Possible urinary tract infection. History of hypertension and stroke. EXAM: CT HEAD WITHOUT CONTRAST TECHNIQUE: Contiguous axial images were obtained from the base of the skull through the vertex without intravenous contrast. COMPARISON:  None. FINDINGS: BRAIN: No intraparenchymal hemorrhage, mass effect nor midline shift. LEFT temporoparietal and occipital lobe encephalomalacia with ex vacuo dilatation LEFT atrium and occipital horn, no hydrocephalus. Patchy supratentorial white matter hypodensities  exclusive of the aforementioned abnormality compatible with mild chronic small vessel ischemic disease. No acute large vascular territory infarct. Similar prominent RIGHT supratentorial cerebral spinal fluid space suggesting old hygroma without mass effect, stable from prior imaging. Basal cisterns are patent. VASCULAR: Mild calcific atherosclerosis of the carotid siphons. SKULL: No skull fracture. No significant scalp soft tissue swelling. SINUSES/ORBITS: The mastoid air-cells and included paranasal sinuses are well-aerated.The included ocular globes and orbital contents are non-suspicious. Status post bilateral ocular lens implants OTHER: None. IMPRESSION: 1. No acute intracranial process. 2. Stable examination including old LEFT PCA infarct and LEFT posterior watershed versus LEFT MCA territory infarcts. Electronically Signed   By: Elon Alas M.D.   On: 07/13/2016 16:03   Ct Abdomen Pelvis W Contrast  Result Date: 07/13/2016 CLINICAL DATA:  Possible urinary tract infection. Right lower quadrant pain. Confusion. EXAM: CT ABDOMEN AND PELVIS WITH CONTRAST TECHNIQUE: Multidetector CT imaging of the abdomen and pelvis was performed using the standard protocol following bolus administration of intravenous contrast. CONTRAST:  147mL ISOVUE-300 IOPAMIDOL (ISOVUE-300) INJECTION 61% COMPARISON:  None. FINDINGS: Lower chest: Bibasilar atelectasis. Cardiomegaly, accentuated by a pectus excavatum deformity. Tiny hiatal hernia. Hepatobiliary: Mild degradation secondary to patient arm position, not raised above the head. Mild hepatomegaly at 18.5 cm craniocaudal. Suspect mild hepatic steatosis. Too small to characterize right hepatic lobe lesion. Normal gallbladder, without biliary ductal dilatation. Pancreas: Normal, without mass or ductal dilatation. Spleen: Normal in size, without focal abnormality. Adrenals/Urinary Tract: Normal adrenal glands. At least partially duplicated left renal collecting system. Mild  right-sided hydroureteronephrosis. This continues to the level of the pelvic brim. A calcification in this region measures 4 mm, including on image 75/ series 7. Difficult to differentiate if this is vascular or within the ureter. The distal ureter is normal in caliber. Normal urinary bladder. Stomach/Bowel: Normal remainder of the  stomach. Scattered colonic diverticula. Colonic stool burden suggests constipation. Normal terminal ileum. Appendectomy. Normal small bowel. Vascular/Lymphatic: Aortic and branch vessel atherosclerosis. No abdominopelvic adenopathy. Reproductive: Hysterectomy.  No adnexal mass. Other: No significant free fluid. Musculoskeletal: Lumbosacral spondylosis with disc bulge at L4-5. IMPRESSION: 1. Mild right-sided hydroureteronephrosis. This continues to the level of a calcification which is either within or adjacent (vascular) to the mid to distal right ureter. Given size transition in this area, favored to represent a ureteric stone. 2.  Possible constipation. 3.  Aortic Atherosclerosis (ICD10-I70.0). 4. Hepatomegaly and probable mild hepatic steatosis. Electronically Signed   By: Abigail Miyamoto M.D.   On: 07/13/2016 16:27    Assessment/Plan Acute encephalopathy acute encephalopathy with visual hallucinations, Vit B12 po started in setting of lower end of normal,  Seroquel started, Mirtazapine/Sertraline were increased  to manage her behaviors per psych consultation, tolerated so far. Observe the patient, prn onsite psych evaluation  Essential hypertension, benign Controlled, continue Metoprolol 12.5mg  bid. Monitor Bp/P daily  CVA (cerebral vascular accident) Hx of CVA/DVT with Protein C/S def, she has mild left sided weakness and dysarthria, on Xarelto. 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 Off Coumadin. Started Xarelto 20mg  for Protein C/S  deficiency.  Depression Mood is stable, continue Sertraline 100mg , Mirtazapine 15mg   Abdominal bloating Continue MiraLax, Colace, simethicone 80mg  tid,  prn suppository.   Constipation Presentation: bloated, abd pain, continue  MiraLax dialy, prn suppository  GERD (gastroesophageal reflux disease) Stable, continue Omeprazole 20mg  daily  Thrush, oral Noted heavy white coated tongue, complete 2 weeks of Nystatin swish and swallow.      Family/ staff Communication: SNF  Labs/tests ordered: none

## 2016-07-18 NOTE — Assessment & Plan Note (Addendum)
Continue MiraLax, Colace, simethicone 80mg  tid,  prn suppository.

## 2016-07-18 NOTE — Assessment & Plan Note (Addendum)
Hx of CVA/DVT with Protein C/S def, she has mild left sided weakness and dysarthria, on Xarelto. Left parietal and occipital hemorrhagic stroke 11/2011

## 2016-07-18 NOTE — Assessment & Plan Note (Signed)
Onset 02/01/2012, left extremity DVT, coumadin treatment should be for 3 months. Off Coumadin. Started Xarelto 20mg  for Protein C/S deficiency.

## 2016-07-18 NOTE — Assessment & Plan Note (Signed)
Controlled, continue Metoprolol 12.5mg  bid. Monitor Bp/P daily

## 2016-07-18 NOTE — Assessment & Plan Note (Signed)
Stable, continue Omeprazole 20mg daily.  

## 2016-07-22 ENCOUNTER — Encounter: Payer: Self-pay | Admitting: Internal Medicine

## 2016-07-22 ENCOUNTER — Non-Acute Institutional Stay (SKILLED_NURSING_FACILITY): Payer: Medicare Other | Admitting: Internal Medicine

## 2016-07-22 DIAGNOSIS — I69322 Dysarthria following cerebral infarction: Secondary | ICD-10-CM | POA: Diagnosis not present

## 2016-07-22 DIAGNOSIS — K219 Gastro-esophageal reflux disease without esophagitis: Secondary | ICD-10-CM | POA: Diagnosis not present

## 2016-07-22 DIAGNOSIS — F0280 Dementia in other diseases classified elsewhere without behavioral disturbance: Secondary | ICD-10-CM | POA: Diagnosis not present

## 2016-07-22 DIAGNOSIS — R531 Weakness: Secondary | ICD-10-CM | POA: Diagnosis not present

## 2016-07-22 DIAGNOSIS — F0151 Vascular dementia with behavioral disturbance: Secondary | ICD-10-CM | POA: Diagnosis not present

## 2016-07-22 DIAGNOSIS — N3000 Acute cystitis without hematuria: Secondary | ICD-10-CM

## 2016-07-22 DIAGNOSIS — K5900 Constipation, unspecified: Secondary | ICD-10-CM

## 2016-07-22 DIAGNOSIS — F329 Major depressive disorder, single episode, unspecified: Secondary | ICD-10-CM

## 2016-07-22 DIAGNOSIS — I10 Essential (primary) hypertension: Secondary | ICD-10-CM | POA: Diagnosis not present

## 2016-07-22 DIAGNOSIS — I82402 Acute embolism and thrombosis of unspecified deep veins of left lower extremity: Secondary | ICD-10-CM

## 2016-07-22 DIAGNOSIS — B370 Candidal stomatitis: Secondary | ICD-10-CM

## 2016-07-22 DIAGNOSIS — F01518 Vascular dementia, unspecified severity, with other behavioral disturbance: Secondary | ICD-10-CM

## 2016-07-22 DIAGNOSIS — F0393 Unspecified dementia, unspecified severity, with mood disturbance: Secondary | ICD-10-CM

## 2016-07-22 NOTE — Progress Notes (Signed)
Provider:  Blanchie Serve MD  Location:  [facility] Room Number: 53 Place of Service:  SNF (31)  PCP: Blanchie Serve, MD Patient Care Team: Blanchie Serve, MD as PCP - General (Internal Medicine) Mast, Man X, NP as Nurse Practitioner (Internal Medicine) Juluis Rainier as Consulting Physician (Optometry) Monna Fam, MD as Consulting Physician (Ophthalmology)  Extended Emergency Contact Information Primary Emergency Contact: [facility] Address: [address],  [address] [name] of [place] Phone: [phone] Work Phone: [phone] Mobile Phone: [phone] Relation: Daughter  Code Status: DNR Goals of Care: Advanced Directive information Advanced Directives 07/18/2016  Does Patient Have a Medical Advance Directive? Yes  Type of Paramedic of [place];Living will;Out of facility DNR (pink MOST or yellow form)  Does patient want to make changes to medical advance directive? No - Patient declined  Copy of [place] in Chart? Yes  Pre-existing out of facility DNR order (yellow form or pink MOST form) Yellow form placed in chart (order not valid for inpatient use)      Chief Complaint  Patient presents with  . New Admit To SNF    New Admission Visit     HPI: Patient is a 83 y.o. female seen today for admission to SNF. She was residing in assisted living prior to this hospitalization. She was in hospital from 07/13/16-07/17/16 with acute encephalopathy with hallucination and was treated for UTI. She was seen by psychiatry service and was placed on seroquel and her mirtazapine and sertraline dosing were adjusted. She has medical history of constipation, CVA, DVT, HTN, right sided weakness from old CVA among others. She is seen in her room today. Per nursing, she had acute behavioral change last night with hallucination. Son mentioned that she has more behavioral  issues in evening and when alone to the staff. Per nursing, her BP has been elevated since admission.   Past Medical History:  Diagnosis Date  . Acute encephalopathy   . Aphasia following unspecified cerebrovascular disease   . 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 Depression   . Dysphagia following cerebrovascular disease   . Hepatic steatosis   . Hydroureteronephrosis   . Hypertension   . Insomnia   . Macular degeneration   . Major depressive disorder, recurrent ([place])   . Neuralgia and neuritis, unspecified ([place])   . Nontraumatic intracranial hemorrhage ([place])   . Stroke ([place])   . 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   . 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 ABDOMINAL HYSTERECTOMY    . APPENDECTOMY    . BREAST SURGERY    . MASTECTOMY Right     reports that she has never smoked. She has never used smokeless tobacco. She reports that she does not drink alcohol or use drugs. Social History   Social History  . Marital status: Widowed    Spouse name: N/A  . Number of children: N/A  . Years of education: N/A   Occupational History  . Not on file.   Social History Main Topics  . Smoking status: Never Smoker  . Smokeless tobacco: Never Used  . Alcohol use No  . Drug use: No  . Sexual activity: No   Other Topics Concern  . Not on file   Social  History Narrative   Lives at [facility], [place] since 04/25/2013   Widowed   Never smoked   Alcohol none   DNR, POA, Living Will    Functional Status Survey:    No family history on file.  Health Maintenance  Topic Date Due  . TETANUS/TDAP  07/17/1948  . DEXA SCAN  07/18/1994  . PNA vac Low Risk Adult (1 of 2 - PCV13) 07/18/1994  . INFLUENZA VACCINE  08/06/2016    Allergies  Allergen Reactions  . Hydrocodone Nausea And Vomiting  . Morphine And Related Nausea And Vomiting  .  Actonel [Risedronate Sodium] Other (See Comments)    unknown  . Darvon [Propoxyphene Hcl] Other (See Comments)    unknown  . Oxycodone Hcl Other (See Comments)    unknown  . Pneumovax [Pneumococcal Polysaccharide Vaccine] Other (See Comments)    unknown    Outpatient Encounter Prescriptions as of 07/22/2016  Medication Sig  . acetaminophen (TYLENOL) 325 MG tablet Take 650 mg by mouth every 4 (four) hours as needed for pain.  Marland Kitchen atorvastatin (LIPITOR) 10 MG tablet Take 10 mg by mouth daily.  . bisacodyl (DULCOLAX) 10 MG suppository Place 1 suppository (10 mg total) rectally daily as needed for moderate constipation.  . Calcium Carbonate-Vitamin D (CALTRATE 600+D) 600-400 MG-UNIT tablet Take 1 tablet by mouth 2 (two) times daily.  . feeding supplement, ENSURE ENLIVE, (ENSURE ENLIVE) LIQD Take 237 mLs by mouth daily at 2 PM.  . fluticasone (FLONASE) 50 MCG/ACT nasal spray Place 1 spray into both nostrils as needed for allergies.   . hydrocortisone 2.5 % cream Apply 1 application topically 2 (two) times daily as needed. Apply to affected area on face  . metoprolol tartrate (LOPRESSOR) 12.5 mg TABS tablet Take 12.5 mg by mouth 2 (two) times daily. For HTN  . mirtazapine (REMERON) 15 MG tablet Take 1 tablet (15 mg total) by mouth at bedtime.  . Multiple Vitamins-Minerals (I-VITE) TABS Take 1 tablet by mouth daily.  Marland Kitchen nystatin (MYCOSTATIN) 100000 UNIT/ML suspension Take 5 mLs (500,000 Units total) by mouth 4 (four) times daily.  Marland Kitchen nystatin (NYSTATIN) powder Apply topically daily as needed. Under left breast   . omeprazole (PRILOSEC) 20 MG capsule Take 20 mg by mouth daily.  . polyethylene glycol (MIRALAX / GLYCOLAX) packet Take 17 g by mouth daily.  . QUEtiapine (SEROQUEL) 25 MG tablet Take 1 tablet (25 mg total) by mouth 3 (three) times daily.  . Rivaroxaban (XARELTO) 15 MG TABS tablet Take 1 tablet (15 mg total) by mouth daily at 6 PM.  . sertraline (ZOLOFT) 100 MG tablet Take 1 tablet (100 mg  total) by mouth daily at 6 PM.  . Simethicone (GAS RELIEF 80 PO) Take 80 mg by mouth 4 (four) times daily - after meals and at bedtime. Take one tablet after meals and at bedtime for gas relief   . vitamin B-12 (CYANOCOBALAMIN) 1000 MCG tablet Take 1 tablet (1,000 mcg total) by mouth daily.  . [DISCONTINUED] nystatin cream (MYCOSTATIN) Apply 1 application topically daily as needed for dry skin.   No facility-administered encounter medications on file as of 07/22/2016.     Review of Systems  Unable to perform ROS: Dementia (limited)  Constitutional: Negative for appetite change, chills, diaphoresis and fatigue.  HENT: Negative for congestion, mouth sores, rhinorrhea, sore throat and trouble swallowing.   Respiratory: Negative for cough, shortness of breath and wheezing.   Cardiovascular: Negative for chest pain, palpitations and leg swelling.  Gastrointestinal:  Negative for abdominal pain, constipation, nausea and vomiting.       Last bowel movement was this morning. Poor appetite   Genitourinary: Negative for dysuria.  Musculoskeletal: Positive for gait problem. Negative for arthralgias and back pain.  Neurological: Positive for weakness. Negative for seizures, syncope, light-headedness and headaches.       History of CVA with residual dysarthria and weakness  Hematological: Bruises/bleeds easily.  Psychiatric/Behavioral: Positive for agitation, behavioral problems, confusion and hallucinations.       Had hallucination last night. None at present    Vitals:   07/22/16 1028  BP: (!) 165/90  Pulse: 81  Resp: 18  Temp: (!) 97.4 F (36.3 C)  TempSrc: Oral  SpO2: 94%  Weight: 152 lb 3.2 oz (69 kg)  Height: 5\' 3"  (1.6 m)   Body mass index is 26.96 kg/m. Physical Exam  Constitutional: She appears well-developed and well-nourished. No distress.  HENT:  Head: Normocephalic and atraumatic.  Right Ear: External ear normal.  Left Ear: External ear normal.  Mouth/Throat: Oropharynx is  clear and moist.  No oral thrush  Eyes: Pupils are equal, round, and reactive to light. Conjunctivae and EOM are normal.  Neck: Normal range of motion. Neck supple. No thyromegaly present.  Cardiovascular: Normal rate and regular rhythm.   No murmur heard. Pulmonary/Chest: Effort normal and breath sounds normal.  Abdominal: Soft. Bowel sounds are normal. She exhibits no distension. There is no tenderness. There is no rebound and no guarding.  Musculoskeletal: She exhibits edema.  Can move all 4 extremities, unsteady gait, mild stoop with wide based gait, uses rolling walker with seat and brake, trace leg edema  Lymphadenopathy:    She has no cervical adenopathy.  Neurological: She is alert.  Oriented only to self, dysarthria present, weakness more prominent to right side.   Skin: Skin is warm and dry. She is not diaphoretic.  Easy bruising  Psychiatric:  Calm this visit, poor attention span    Labs reviewed: Basic Metabolic Panel:  Recent Labs  07/13/16 1420 07/14/16 0638 07/15/16 0409 07/17/16 0627  NA 140 142 138 139  K 3.5 3.7 3.3* 3.8  CL 109 111 107 108  CO2 23 23 23 26   GLUCOSE 113* 94 88 97  BUN 10 7 9 10   CREATININE 0.99 1.01* 1.04* 0.91  CALCIUM 9.2 8.8* 9.0 9.0  MG 2.4  --   --   --   PHOS 2.3*  --   --   --    Liver Function Tests:  Recent Labs  08/02/15 07/13/16 1420  AST 20 25  ALT 16 16  ALKPHOS 107 110  BILITOT  --  0.8  PROT  --  6.8  ALBUMIN  --  4.2   No results for input(s): LIPASE, AMYLASE in the last 8760 hours. No results for input(s): AMMONIA in the last 8760 hours. CBC:  Recent Labs  07/13/16 1420 07/13/16 1907 07/14/16 0638 07/15/16 0409  WBC 8.2  --  5.8 6.5  NEUTROABS 5.7  --   --   --   HGB 14.4  --  13.2 13.3  HCT 43.9 41.2 41.3 41.3  MCV 94.2  --  97.9 95.6  PLT 213  --  210 191   Cardiac Enzymes: No results for input(s): CKTOTAL, CKMB, CKMBINDEX, TROPONINI in the last 8760 hours. BNP: Invalid input(s): POCBNP Lab  Results  Component Value Date   HGBA1C 5.5 08/02/2015   Lab Results  Component Value Date   TSH  1.57 12/25/2015   Lab Results  Component Value Date   VITAMINB12 192 07/14/2016   No results found for: FOLATE No results found for: IRON, TIBC, FERRITIN  Imaging and Procedures obtained prior to SNF admission: Dg Chest 2 View  Result Date: 07/13/2016 CLINICAL DATA:  Patient very confused and unable to provide history. Hallucinations and possible UTI. EXAM: CHEST  2 VIEW COMPARISON:  08/20/2014. FINDINGS: Low volume film. Cardiopericardial silhouette is at upper limits of normal for size. There is pulmonary vascular congestion without overt pulmonary edema. Subsegmental atelectasis noted left lung base. No pleural effusion. The visualized bony structures of the thorax are intact. IMPRESSION: Low lung volumes with borderline cardiomegaly and vascular congestion. Electronically Signed   By: Misty Stanley M.D.   On: 07/13/2016 14:54   Ct Head Wo Contrast  Result Date: 07/13/2016 CLINICAL DATA:  Hallucination, confusion. Possible urinary tract infection. History of hypertension and stroke. EXAM: CT HEAD WITHOUT CONTRAST TECHNIQUE: Contiguous axial images were obtained from the base of the skull through the vertex without intravenous contrast. COMPARISON:  None. FINDINGS: BRAIN: No intraparenchymal hemorrhage, mass effect nor midline shift. LEFT temporoparietal and occipital lobe encephalomalacia with ex vacuo dilatation LEFT atrium and occipital horn, no hydrocephalus. Patchy supratentorial white matter hypodensities exclusive of the aforementioned abnormality compatible with mild chronic small vessel ischemic disease. No acute large vascular territory infarct. Similar prominent RIGHT supratentorial cerebral spinal fluid space suggesting old hygroma without mass effect, stable from prior imaging. Basal cisterns are patent. VASCULAR: Mild calcific atherosclerosis of the carotid siphons. SKULL: No skull  fracture. No significant scalp soft tissue swelling. SINUSES/ORBITS: The mastoid air-cells and included paranasal sinuses are well-aerated.The included ocular globes and orbital contents are non-suspicious. Status post bilateral ocular lens implants OTHER: None. IMPRESSION: 1. No acute intracranial process. 2. Stable examination including old LEFT PCA infarct and LEFT posterior watershed versus LEFT MCA territory infarcts. Electronically Signed   By: Elon Alas M.D.   On: 07/13/2016 16:03   Ct Abdomen Pelvis W Contrast  Result Date: 07/13/2016 CLINICAL DATA:  Possible urinary tract infection. Right lower quadrant pain. Confusion. EXAM: CT ABDOMEN AND PELVIS WITH CONTRAST TECHNIQUE: Multidetector CT imaging of the abdomen and pelvis was performed using the standard protocol following bolus administration of intravenous contrast. CONTRAST:  169mL ISOVUE-300 IOPAMIDOL (ISOVUE-300) INJECTION 61% COMPARISON:  None. FINDINGS: Lower chest: Bibasilar atelectasis. Cardiomegaly, accentuated by a pectus excavatum deformity. Tiny hiatal hernia. Hepatobiliary: Mild degradation secondary to patient arm position, not raised above the head. Mild hepatomegaly at 18.5 cm craniocaudal. Suspect mild hepatic steatosis. Too small to characterize right hepatic lobe lesion. Normal gallbladder, without biliary ductal dilatation. Pancreas: Normal, without mass or ductal dilatation. Spleen: Normal in size, without focal abnormality. Adrenals/Urinary Tract: Normal adrenal glands. At least partially duplicated left renal collecting system. Mild right-sided hydroureteronephrosis. This continues to the level of the pelvic brim. A calcification in this region measures 4 mm, including on image 75/ series 7. Difficult to differentiate if this is vascular or within the ureter. The distal ureter is normal in caliber. Normal urinary bladder. Stomach/Bowel: Normal remainder of the stomach. Scattered colonic diverticula. Colonic stool burden  suggests constipation. Normal terminal ileum. Appendectomy. Normal small bowel. Vascular/Lymphatic: Aortic and branch vessel atherosclerosis. No abdominopelvic adenopathy. Reproductive: Hysterectomy.  No adnexal mass. Other: No significant free fluid. Musculoskeletal: Lumbosacral spondylosis with disc bulge at L4-5. IMPRESSION: 1. Mild right-sided hydroureteronephrosis. This continues to the level of a calcification which is either within or adjacent (vascular) to the mid  to distal right ureter. Given size transition in this area, favored to represent a ureteric stone. 2.  Possible constipation. 3.  Aortic Atherosclerosis (ICD10-I70.0). 4. Hepatomegaly and probable mild hepatic steatosis. Electronically Signed   By: Abigail Miyamoto M.D.   On: 07/13/2016 16:27    Assessment/Plan  Generalized weakness From deconditioning with recent UTI. Will have her work with physical therapy and occupational therapy team to help with gait training and muscle strengthening exercises.fall precautions. Skin care. Encourage to be out of bed.   UTI Has completed antibiotic course, denies symptom, given new worsening behavior last night and recent UTI, send u/a with c/s to assess further  Constipation Recently had obstipation in hospital. Monitor BM. C/w simethicone with meals. Continue miralax daily  Oral thrush Resolved, continue nystatin swish and swallow qid for total of 2 weeks, oral care post meals  HTN Elevated BP, currently on lopressor 12.5 mg bid, increase this to 25 mg bid with holding parameter, bp bid x 2 weeks, check bmp  gerd Continue omeprazole  Dementia with behavioral disturbance Currently on seroquel 25 mg tid with sertraline and mirtazapine. Had psychosis las night. Will rule out infection but concern for worsening dementia. Increase seroquel to 50 mg bid. Provide supportive care. Fall precautions and pressure ulcer prophylaxis.   History of CVA Continue antihypertensive and statin. Continue  xarelto for secondary stroke prophylaxis  Depression with dementia Continue increased dosing of seroquel and mirtazapine. Monitor clinically  Dysarthria With cognitive impairment, SLP consult, aspiration precautions, assist with feeding if needed  History of DVT Continue xarelto lifelong with history of protein c and S deficiency  b12 def Continue b12 supplement   Family/ staff Communication: reviewed care plan with patient and charge nurse.    Labs/tests ordered: cbc, cmp, u/a with c/s  Blanchie Serve, MD Internal Medicine [facility] [address] [place],  [address] Cell Phone (Monday-Friday 8 am - 5 pm): [phone] On Call: [phone] and follow prompts after 5 pm and on weekends Office Phone: [phone] Office Fax: [phone]

## 2016-07-24 LAB — HEPATIC FUNCTION PANEL
ALT: 13 (ref 7–35)
AST: 15 (ref 13–35)
Alkaline Phosphatase: 92 (ref 25–125)
Bilirubin, Total: 0.3

## 2016-07-24 LAB — CBC AND DIFFERENTIAL
HCT: 39 (ref 36–46)
Hemoglobin: 13.1 (ref 12.0–16.0)
Platelets: 222 (ref 150–399)
WBC: 5.3

## 2016-07-24 LAB — BASIC METABOLIC PANEL
BUN: 11 (ref 4–21)
CREATININE: 1.1 (ref <=1.1)
GLUCOSE: 83
POTASSIUM: 3.6 (ref 3.4–5.3)
SODIUM: 142 (ref 137–147)

## 2016-07-25 ENCOUNTER — Other Ambulatory Visit: Payer: Self-pay | Admitting: *Deleted

## 2016-08-05 ENCOUNTER — Encounter: Payer: Self-pay | Admitting: Nurse Practitioner

## 2016-08-05 ENCOUNTER — Non-Acute Institutional Stay (SKILLED_NURSING_FACILITY): Payer: Medicare Other | Admitting: Nurse Practitioner

## 2016-08-05 DIAGNOSIS — F329 Major depressive disorder, single episode, unspecified: Secondary | ICD-10-CM

## 2016-08-05 DIAGNOSIS — F0393 Unspecified dementia, unspecified severity, with mood disturbance: Secondary | ICD-10-CM

## 2016-08-05 DIAGNOSIS — I8291 Chronic embolism and thrombosis of unspecified vein: Secondary | ICD-10-CM | POA: Diagnosis not present

## 2016-08-05 DIAGNOSIS — B370 Candidal stomatitis: Secondary | ICD-10-CM | POA: Diagnosis not present

## 2016-08-05 DIAGNOSIS — F0280 Dementia in other diseases classified elsewhere without behavioral disturbance: Secondary | ICD-10-CM | POA: Diagnosis not present

## 2016-08-05 DIAGNOSIS — I10 Essential (primary) hypertension: Secondary | ICD-10-CM | POA: Diagnosis not present

## 2016-08-05 DIAGNOSIS — F0151 Vascular dementia with behavioral disturbance: Secondary | ICD-10-CM

## 2016-08-05 DIAGNOSIS — K5900 Constipation, unspecified: Secondary | ICD-10-CM | POA: Diagnosis not present

## 2016-08-05 DIAGNOSIS — F01518 Vascular dementia, unspecified severity, with other behavioral disturbance: Secondary | ICD-10-CM

## 2016-08-05 DIAGNOSIS — K219 Gastro-esophageal reflux disease without esophagitis: Secondary | ICD-10-CM | POA: Diagnosis not present

## 2016-08-05 NOTE — Assessment & Plan Note (Signed)
Resolved

## 2016-08-05 NOTE — Assessment & Plan Note (Signed)
Hx of CVT, Protein C and S deficiency, continue Xarelto

## 2016-08-05 NOTE — Assessment & Plan Note (Signed)
Mood is stable, continue Sertraline 100mg , Mirtazapine 15mg 

## 2016-08-05 NOTE — Assessment & Plan Note (Signed)
Stable, continue  MiraLax dialy, prn suppository

## 2016-08-05 NOTE — Assessment & Plan Note (Signed)
Hx of visual hallucination, hospitalization 07/13/16 to 07/17/16 for acute encephalopathy with visual hallucinations, Vit B12 po started in setting of lower end of normal,  Seroquel started, Mirtazapine15/7.5mg  /Sertraline 100/75mg  were increased  to manage her behaviors per psych consultation No visual hallucination since last seen.

## 2016-08-05 NOTE — Assessment & Plan Note (Signed)
Stable, continue Omeprazole 20mg daily.  

## 2016-08-05 NOTE — Assessment & Plan Note (Signed)
Controlled, continue Metoprolol 12.5mg bid.  

## 2016-08-05 NOTE — Progress Notes (Signed)
Location:  [facility] Room Number: 28 Place of Service:  SNF (31)  Provider: Margel Joens, [place]  NP  PCP: Blanchie Serve, MD Patient Care Team: Blanchie Serve, MD as PCP - General (Internal Medicine) Olinda Nola X, NP as Nurse Practitioner (Internal Medicine) Juluis Rainier as Consulting Physician (Optometry) Monna Fam, MD as Consulting Physician (Ophthalmology)  Extended Emergency Contact Information Primary Emergency Contact: [facility] Address: [address], [place] [address] [name] of [place] Phone: [phone] Work Phone: [phone] Mobile Phone: [phone] Relation: Daughter  Code Status: DNR Goals of care:  Advanced Directive information Advanced Directives 08/05/2016  Does Patient Have a Medical Advance Directive? Yes  Type of Paramedic of [place];Out of facility DNR (pink MOST or yellow form)  Does patient want to make changes to medical advance directive? No - Patient declined  Copy of [place] in Chart? Yes  Pre-existing out of facility DNR order (yellow form or pink MOST form) Yellow form placed in chart (order not valid for inpatient use)     Allergies  Allergen Reactions  . Hydrocodone Nausea And Vomiting  . Morphine And Related Nausea And Vomiting  . Actonel [Risedronate Sodium] Other (See Comments)    unknown  . Darvon [Propoxyphene Hcl] Other (See Comments)    unknown  . Oxycodone Hcl Other (See Comments)    unknown  . Pneumovax [Pneumococcal Polysaccharide Vaccine] Other (See Comments)    unknown    Chief Complaint  Patient presents with  . Discharge Note    return to [facility]    HPI:  83 y.o. female was admitted to SNF following her hospitalization 07/13/16 to 07/17/16 for acute encephalopathy with visual hallucinations, Vit B12 po started in setting of lower end of normal,  Seroquel started, Mirtazapine15/7.5mg  /Sertraline 100/75mg   were increased  to manage her behaviors per psych consultation, the patient has no visual hallucination since last seen 07/22/16 by Dr. Bubba Camp. She has regain her physical strength, her mentation has been stabilized, she meets AL level of care.   Hx of constipation, managed with  Miralax, prn suppository, HTN blood pressure is controlled on Metoprolol, Hx of CVA/DVT with Protein C/S def, she has mild right sided weakness and dysarthria, on Xarelto.    Past Medical History:  Diagnosis Date  . Acute encephalopathy   . Aphasia following unspecified cerebrovascular disease   . 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 Depression   . Dysphagia following cerebrovascular disease   . Hepatic steatosis   . Hydroureteronephrosis   . Hypertension   . Insomnia   . Macular degeneration   . Major depressive disorder, recurrent ([place])   . Neuralgia and neuritis, unspecified ([place])   . Nontraumatic intracranial hemorrhage ([place])   . Stroke ([place])   . 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   . 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 ABDOMINAL HYSTERECTOMY    . APPENDECTOMY    . BREAST SURGERY    . MASTECTOMY Right       reports that she has never smoked. She has never used smokeless tobacco. She reports that she does not drink alcohol or use drugs. Social History   Social History  . Marital status: Widowed    Spouse name: N/A  . Number  of children: N/A  . Years of education: N/A   Occupational History  . Not on file.   Social History Main Topics  . Smoking status: Never Smoker  . Smokeless tobacco: Never Used  . Alcohol use No  . Drug use: No  . Sexual activity: No   Other Topics Concern  . Not on file   Social History Narrative   Lives at [facility], [place] since 04/25/2013   Widowed   Never smoked   Alcohol none   DNR, POA, Living Will   Functional  Status Survey:    Allergies  Allergen Reactions  . Hydrocodone Nausea And Vomiting  . Morphine And Related Nausea And Vomiting  . Actonel [Risedronate Sodium] Other (See Comments)    unknown  . Darvon [Propoxyphene Hcl] Other (See Comments)    unknown  . Oxycodone Hcl Other (See Comments)    unknown  . Pneumovax [Pneumococcal Polysaccharide Vaccine] Other (See Comments)    unknown    Pertinent  Health Maintenance Due  Topic Date Due  . DEXA SCAN  07/18/1994  . PNA vac Low Risk Adult (1 of 2 - PCV13) 07/18/1994  . INFLUENZA VACCINE  08/06/2016    Medications: Outpatient Encounter Prescriptions as of 08/05/2016  Medication Sig  . acetaminophen (TYLENOL) 325 MG tablet Take 650 mg by mouth every 4 (four) hours as needed for pain.  Marland Kitchen atorvastatin (LIPITOR) 10 MG tablet Take 10 mg by mouth daily.  . bisacodyl (DULCOLAX) 10 MG suppository Place 1 suppository (10 mg total) rectally daily as needed for moderate constipation.  . Calcium Carbonate-Vitamin D (CALTRATE 600+D) 600-400 MG-UNIT tablet Take 1 tablet by mouth 2 (two) times daily.  . feeding supplement, ENSURE ENLIVE, (ENSURE ENLIVE) LIQD Take 237 mLs by mouth daily at 2 PM.  . fluticasone (FLONASE) 50 MCG/ACT nasal spray Place 1 spray into both nostrils as needed for allergies.   . hydrocortisone 2.5 % cream Apply 1 application topically 2 (two) times daily as needed. Apply to affected area on face  . metoprolol tartrate (LOPRESSOR) 12.5 mg TABS tablet Take 12.5 mg by mouth 2 (two) times daily. For HTN  . mirtazapine (REMERON) 15 MG tablet Take 1 tablet (15 mg total) by mouth at bedtime.  . Multiple Vitamins-Minerals (I-VITE) TABS Take 1 tablet by mouth daily.  Marland Kitchen nystatin (NYSTATIN) powder Apply topically daily as needed. Under left breast   . omeprazole (PRILOSEC) 20 MG capsule Take 20 mg by mouth daily.  . polyethylene glycol (MIRALAX / GLYCOLAX) packet Take 17 g by mouth daily.  . QUEtiapine (SEROQUEL) 25 MG tablet Take 1  tablet (25 mg total) by mouth 3 (three) times daily.  . Rivaroxaban (XARELTO) 15 MG TABS tablet Take 1 tablet (15 mg total) by mouth daily at 6 PM.  . sertraline (ZOLOFT) 100 MG tablet Take 1 tablet (100 mg total) by mouth daily at 6 PM.  . Simethicone (GAS RELIEF 80 PO) Take 80 mg by mouth 4 (four) times daily - after meals and at bedtime. Take one tablet after meals and at bedtime for gas relief   . vitamin B-12 (CYANOCOBALAMIN) 1000 MCG tablet Take 1 tablet (1,000 mcg total) by mouth daily.  . [DISCONTINUED] nystatin (MYCOSTATIN) 100000 UNIT/ML suspension Take 5 mLs (500,000 Units total) by mouth 4 (four) times daily.   No facility-administered encounter medications on file as of 08/05/2016.     Review of Systems  Unable to perform ROS: Dementia (limited)  Constitutional: Negative for activity change  and appetite change.  HENT: Negative for congestion, mouth sores, rhinorrhea, sore throat and trouble swallowing.   Respiratory: Negative for cough, shortness of breath and wheezing.   Cardiovascular: Negative for chest pain, palpitations and leg swelling.  Gastrointestinal: Negative for abdominal pain, constipation, nausea and vomiting.       Last bowel movement was this morning. Poor appetite   Genitourinary: Negative for dysuria.  Musculoskeletal: Positive for gait problem. Negative for arthralgias and back pain.  Neurological: Positive for weakness. Negative for seizures, light-headedness and headaches.       History of CVA with residual dysarthria and weakness  Hematological: Does not bruise/bleed easily.  Psychiatric/Behavioral: Positive for confusion. Negative for agitation, behavioral problems and hallucinations.       No further hallucination since last seen.     Vitals:   08/05/16 1212  BP: 130/80  Pulse: 82  Resp: 18  Temp: (!) 96.5 F (35.8 C)  SpO2: 97%  Weight: 151 lb 12.8 oz (68.9 kg)  Height: 5\' 3"  (1.6 m)   Body mass index is 26.89 kg/m. Physical Exam    Constitutional: She appears well-developed and well-nourished. No distress.  HENT:  Head: Normocephalic and atraumatic.  Right Ear: External ear normal.  Left Ear: External ear normal.  Mouth/Throat: Oropharynx is clear and moist.  Eyes: Pupils are equal, round, and reactive to light. Conjunctivae and EOM are normal.  Neck: Normal range of motion. Neck supple. No thyromegaly present.  Cardiovascular: Normal rate and regular rhythm.   No murmur heard. Pulmonary/Chest: Effort normal and breath sounds normal.  Abdominal: Soft. Bowel sounds are normal. She exhibits no distension. There is no tenderness.  Musculoskeletal: She exhibits edema.  uses rolling walker with seat and brake, trace edema BLE  Neurological: She is alert.  weakness more prominent to right side.   Skin: Skin is warm and dry. She is not diaphoretic.  Psychiatric:  She was seen in her room, coloring with pencil.     Labs reviewed: Basic Metabolic Panel:  Recent Labs  07/13/16 1420 07/14/16 0638 07/15/16 0409 07/17/16 0627 07/24/16  NA 140 142 138 139 142  K 3.5 3.7 3.3* 3.8 3.6  CL 109 111 107 108  --   CO2 23 23 23 26   --   GLUCOSE 113* 94 88 97  --   BUN 10 7 9 10 11   CREATININE 0.99 1.01* 1.04* 0.91 1.1  CALCIUM 9.2 8.8* 9.0 9.0  --   MG 2.4  --   --   --   --   PHOS 2.3*  --   --   --   --    Liver Function Tests:  Recent Labs  07/13/16 1420 07/24/16  AST 25 15  ALT 16 13  ALKPHOS 110 92  BILITOT 0.8  --   PROT 6.8  --   ALBUMIN 4.2  --    No results for input(s): LIPASE, AMYLASE in the last 8760 hours. No results for input(s): AMMONIA in the last 8760 hours. CBC:  Recent Labs  07/13/16 1420  07/14/16 0638 07/15/16 0409 07/24/16  WBC 8.2  --  5.8 6.5 5.3  NEUTROABS 5.7  --   --   --   --   HGB 14.4  --  13.2 13.3 13.1  HCT 43.9  < > 41.3 41.3 39  MCV 94.2  --  97.9 95.6  --   PLT 213  --  210 191 222  < > = values in this interval  not displayed. Cardiac Enzymes: No results for  input(s): CKTOTAL, CKMB, CKMBINDEX, TROPONINI in the last 8760 hours. BNP: Invalid input(s): POCBNP CBG: No results for input(s): GLUCAP in the last 8760 hours.  Procedures and Imaging Studies During Stay: Dg Chest 2 View  Result Date: 07/13/2016 CLINICAL DATA:  Patient very confused and unable to provide history. Hallucinations and possible UTI. EXAM: CHEST  2 VIEW COMPARISON:  08/20/2014. FINDINGS: Low volume film. Cardiopericardial silhouette is at upper limits of normal for size. There is pulmonary vascular congestion without overt pulmonary edema. Subsegmental atelectasis noted left lung base. No pleural effusion. The visualized bony structures of the thorax are intact. IMPRESSION: Low lung volumes with borderline cardiomegaly and vascular congestion. Electronically Signed   By: Misty Stanley M.D.   On: 07/13/2016 14:54   Ct Head Wo Contrast  Result Date: 07/13/2016 CLINICAL DATA:  Hallucination, confusion. Possible urinary tract infection. History of hypertension and stroke. EXAM: CT HEAD WITHOUT CONTRAST TECHNIQUE: Contiguous axial images were obtained from the base of the skull through the vertex without intravenous contrast. COMPARISON:  None. FINDINGS: BRAIN: No intraparenchymal hemorrhage, mass effect nor midline shift. LEFT temporoparietal and occipital lobe encephalomalacia with ex vacuo dilatation LEFT atrium and occipital horn, no hydrocephalus. Patchy supratentorial white matter hypodensities exclusive of the aforementioned abnormality compatible with mild chronic small vessel ischemic disease. No acute large vascular territory infarct. Similar prominent RIGHT supratentorial cerebral spinal fluid space suggesting old hygroma without mass effect, stable from prior imaging. Basal cisterns are patent. VASCULAR: Mild calcific atherosclerosis of the carotid siphons. SKULL: No skull fracture. No significant scalp soft tissue swelling. SINUSES/ORBITS: The mastoid air-cells and included paranasal  sinuses are well-aerated.The included ocular globes and orbital contents are non-suspicious. Status post bilateral ocular lens implants OTHER: None. IMPRESSION: 1. No acute intracranial process. 2. Stable examination including old LEFT PCA infarct and LEFT posterior watershed versus LEFT MCA territory infarcts. Electronically Signed   By: Elon Alas M.D.   On: 07/13/2016 16:03   Ct Abdomen Pelvis W Contrast  Result Date: 07/13/2016 CLINICAL DATA:  Possible urinary tract infection. Right lower quadrant pain. Confusion. EXAM: CT ABDOMEN AND PELVIS WITH CONTRAST TECHNIQUE: Multidetector CT imaging of the abdomen and pelvis was performed using the standard protocol following bolus administration of intravenous contrast. CONTRAST:  186mL ISOVUE-300 IOPAMIDOL (ISOVUE-300) INJECTION 61% COMPARISON:  None. FINDINGS: Lower chest: Bibasilar atelectasis. Cardiomegaly, accentuated by a pectus excavatum deformity. Tiny hiatal hernia. Hepatobiliary: Mild degradation secondary to patient arm position, not raised above the head. Mild hepatomegaly at 18.5 cm craniocaudal. Suspect mild hepatic steatosis. Too small to characterize right hepatic lobe lesion. Normal gallbladder, without biliary ductal dilatation. Pancreas: Normal, without mass or ductal dilatation. Spleen: Normal in size, without focal abnormality. Adrenals/Urinary Tract: Normal adrenal glands. At least partially duplicated left renal collecting system. Mild right-sided hydroureteronephrosis. This continues to the level of the pelvic brim. A calcification in this region measures 4 mm, including on image 75/ series 7. Difficult to differentiate if this is vascular or within the ureter. The distal ureter is normal in caliber. Normal urinary bladder. Stomach/Bowel: Normal remainder of the stomach. Scattered colonic diverticula. Colonic stool burden suggests constipation. Normal terminal ileum. Appendectomy. Normal small bowel. Vascular/Lymphatic: Aortic and  branch vessel atherosclerosis. No abdominopelvic adenopathy. Reproductive: Hysterectomy.  No adnexal mass. Other: No significant free fluid. Musculoskeletal: Lumbosacral spondylosis with disc bulge at L4-5. IMPRESSION: 1. Mild right-sided hydroureteronephrosis. This continues to the level of a calcification which is either within or adjacent (vascular) to  the mid to distal right ureter. Given size transition in this area, favored to represent a ureteric stone. 2.  Possible constipation. 3.  Aortic Atherosclerosis (ICD10-I70.0). 4. Hepatomegaly and probable mild hepatic steatosis. Electronically Signed   By: Abigail Miyamoto M.D.   On: 07/13/2016 16:27    Assessment/Plan:   Essential hypertension, benign Controlled, continue Metoprolol 12.5mg  bid  Chronic embolism and thrombosis of unspecified vein Hx of CVT, Protein C and S deficiency, continue Xarelto  Constipation Stable, continue  MiraLax dialy, prn suppository  Thrush, oral Resolved.   Depression due to dementia Mood is stable, continue Sertraline 100mg , Mirtazapine 15mg   Vascular dementia with behavior disturbance Hx of visual hallucination, hospitalization 07/13/16 to 07/17/16 for acute encephalopathy with visual hallucinations, Vit B12 po started in setting of lower end of normal,  Seroquel started, Mirtazapine15/7.5mg  /Sertraline 100/75mg  were increased  to manage her behaviors per psych consultation No visual hallucination since last seen.   GERD (gastroesophageal reflux disease) Stable, continue Omeprazole 20mg  daily     Patient is being discharged with the following home health services:    Patient is being discharged with the following durable medical equipment:    Patient has been advised to f/u with their PCP in 1-2 weeks to for a transitions of care visit.  Social services at their facility was responsible for arranging this appointment.  Pt was provided with adequate prescriptions of noncontrolled medications to reach the  scheduled appointment .  For controlled substances, a limited supply was provided as appropriate for the individual patient.  If the pt normally receives these medications from a pain clinic or has a contract with another physician, these medications should be received from that clinic or physician only).    Future labs/tests needed:  prn

## 2016-08-13 ENCOUNTER — Non-Acute Institutional Stay: Payer: Medicare Other | Admitting: Nurse Practitioner

## 2016-08-13 DIAGNOSIS — F329 Major depressive disorder, single episode, unspecified: Secondary | ICD-10-CM | POA: Diagnosis not present

## 2016-08-13 DIAGNOSIS — R079 Chest pain, unspecified: Secondary | ICD-10-CM

## 2016-08-13 DIAGNOSIS — R443 Hallucinations, unspecified: Secondary | ICD-10-CM | POA: Diagnosis not present

## 2016-08-13 DIAGNOSIS — F0280 Dementia in other diseases classified elsewhere without behavioral disturbance: Secondary | ICD-10-CM

## 2016-08-13 DIAGNOSIS — K219 Gastro-esophageal reflux disease without esophagitis: Secondary | ICD-10-CM

## 2016-08-13 DIAGNOSIS — F0393 Unspecified dementia, unspecified severity, with mood disturbance: Secondary | ICD-10-CM

## 2016-08-13 LAB — HEPATIC FUNCTION PANEL
ALT: 16 (ref 7–35)
AST: 17 (ref 13–35)
Alkaline Phosphatase: 108 (ref 25–125)
BILIRUBIN, TOTAL: 0.6

## 2016-08-13 LAB — CBC AND DIFFERENTIAL
HEMATOCRIT: 41 (ref 36–46)
Hemoglobin: 13.5 (ref 12.0–16.0)
PLATELETS: 264 (ref 150–399)
WBC: 6.8

## 2016-08-13 LAB — BASIC METABOLIC PANEL
BUN: 14 (ref 4–21)
CREATININE: 1 (ref <=1.1)
Glucose: 100
Potassium: 3.7 (ref 3.4–5.3)
Sodium: 137 (ref 137–147)

## 2016-08-14 ENCOUNTER — Encounter: Payer: Self-pay | Admitting: Internal Medicine

## 2016-08-14 ENCOUNTER — Encounter: Payer: Self-pay | Admitting: Nurse Practitioner

## 2016-08-14 ENCOUNTER — Other Ambulatory Visit: Payer: Self-pay | Admitting: *Deleted

## 2016-08-14 ENCOUNTER — Non-Acute Institutional Stay (SKILLED_NURSING_FACILITY): Payer: Medicare Other | Admitting: Internal Medicine

## 2016-08-14 DIAGNOSIS — R079 Chest pain, unspecified: Secondary | ICD-10-CM | POA: Insufficient documentation

## 2016-08-14 DIAGNOSIS — K5909 Other constipation: Secondary | ICD-10-CM | POA: Diagnosis not present

## 2016-08-14 DIAGNOSIS — F418 Other specified anxiety disorders: Secondary | ICD-10-CM

## 2016-08-14 DIAGNOSIS — K219 Gastro-esophageal reflux disease without esophagitis: Secondary | ICD-10-CM | POA: Diagnosis not present

## 2016-08-14 DIAGNOSIS — F0281 Dementia in other diseases classified elsewhere with behavioral disturbance: Secondary | ICD-10-CM

## 2016-08-14 DIAGNOSIS — R441 Visual hallucinations: Secondary | ICD-10-CM

## 2016-08-14 DIAGNOSIS — Z86718 Personal history of other venous thrombosis and embolism: Secondary | ICD-10-CM | POA: Diagnosis not present

## 2016-08-14 DIAGNOSIS — G309 Alzheimer's disease, unspecified: Secondary | ICD-10-CM

## 2016-08-14 DIAGNOSIS — J309 Allergic rhinitis, unspecified: Secondary | ICD-10-CM

## 2016-08-14 DIAGNOSIS — R443 Hallucinations, unspecified: Secondary | ICD-10-CM | POA: Insufficient documentation

## 2016-08-14 DIAGNOSIS — I10 Essential (primary) hypertension: Secondary | ICD-10-CM

## 2016-08-14 DIAGNOSIS — E538 Deficiency of other specified B group vitamins: Secondary | ICD-10-CM

## 2016-08-14 DIAGNOSIS — Z8673 Personal history of transient ischemic attack (TIA), and cerebral infarction without residual deficits: Secondary | ICD-10-CM

## 2016-08-14 NOTE — Assessment & Plan Note (Signed)
Acute hallucinations, ? Psychotic features, continue Sertraline 100mg , Mirtazapine 15mg , Seroquel 50mg  bid. May increase Seroquel to 100mg /50mg  at night, continue 50mg  qam if no improvement.
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
## 2016-08-14 NOTE — Progress Notes (Signed)
This encounter was created in error - please disregard.

## 2016-08-14 NOTE — Progress Notes (Signed)
Provider:  Blanchie Serve MD  Location:  [facility] Room Number: [place]:  SNF (31)  PCP: Blanchie Serve, MD Patient Care Team: Blanchie Serve, MD as PCP - General (Internal Medicine) Mast, Man X, NP as Nurse Practitioner (Internal Medicine) Juluis Rainier as Consulting Physician (Optometry) Monna Fam, MD as Consulting Physician (Ophthalmology)  Extended Emergency Contact Information Primary Emergency Contact: [facility] Address: [address], [place] [address] [name] of [place] Phone: [phone] Work Phone: [phone] Mobile Phone: [phone] Relation: Daughter  Code Status: DNR  Goals of Care: Advanced Directive information Advanced Directives 08/14/2016  Does Patient Have a Medical Advance Directive? Yes  Type of Paramedic of [place];Out of facility DNR (pink MOST or yellow form);Living will  Does patient want to make changes to medical advance directive? No - Patient declined  Copy of [place] in Chart? Yes  Pre-existing out of facility DNR order (yellow form or pink MOST form) Yellow form placed in chart (order not valid for inpatient use)      Chief Complaint  Patient presents with  . Readmit To SNF    Readmission Visit     HPI: Patient is a 83 y.o. female seen today for re-admission to SNF. She has been admitted to SNF from AL with her hallucination and increased need for assistance with her ADLs. She has dementia with behavioral disturbances. Of note she was in the hospital in 1st week of July and was at the memory care unit following this. She was discharged back to ALF on 08/05/16. She has medical history of constipation, CVA, DVT, HTN, right sided weakness from old CVA among others. She is seen in her room today. She participates minimally in HPI and ROS.     Past Medical History:  Diagnosis Date  . Acute encephalopathy   .  Aphasia following unspecified cerebrovascular disease   . 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 Depression   . Dysphagia following cerebrovascular disease   . Hepatic steatosis   . Hydroureteronephrosis   . Hypertension   . Insomnia   . Macular degeneration   . Major depressive disorder, recurrent ([place])   . Neuralgia and neuritis, unspecified ([place])   . Nontraumatic intracranial hemorrhage ([place])   . Stroke ([place])   . 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   . 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 ABDOMINAL HYSTERECTOMY    . APPENDECTOMY    . BREAST SURGERY    . MASTECTOMY Right     reports that she has never smoked. She has never used smokeless tobacco. She reports that she does not drink alcohol or use drugs. Social History   Social History  . Marital status: Widowed    Spouse name: N/A  . Number of children: N/A  . Years of education: N/A   Occupational History  . Not on file.   Social History Main Topics  . Smoking status: Never Smoker  . Smokeless tobacco: Never Used  . Alcohol use No  . Drug use: No  . Sexual activity: No   Other Topics Concern  . Not on file   Social History Narrative   Lives at [facility], [place] since 04/25/2013   Widowed   Never smoked  Alcohol none   DNR, POA, Living Will    Functional Status Survey:    No family history on file.  Health Maintenance  Topic Date Due  . TETANUS/TDAP  07/17/1946  . DEXA SCAN  07/18/1994  . PNA vac Low Risk Adult (1 of 2 - PCV13) 07/18/1994  . INFLUENZA VACCINE  08/06/2016    Allergies  Allergen Reactions  . Hydrocodone Nausea And Vomiting  . Morphine And Related Nausea And Vomiting  . Actonel [Risedronate Sodium] Other (See Comments)    unknown  . Darvon [Propoxyphene Hcl] Other (See Comments)    unknown  . Oxycodone Hcl Other (See Comments)    unknown  .  Pneumovax [Pneumococcal Polysaccharide Vaccine] Other (See Comments)    unknown    Outpatient Encounter Prescriptions as of 08/14/2016  Medication Sig  . acetaminophen (TYLENOL) 325 MG tablet Take 650 mg by mouth every 4 (four) hours as needed for pain.  Marland Kitchen atorvastatin (LIPITOR) 10 MG tablet Take 10 mg by mouth daily.  . bisacodyl (DULCOLAX) 10 MG suppository Place 1 suppository (10 mg total) rectally daily as needed for moderate constipation.  . Calcium Carbonate-Vitamin D (CALTRATE 600+D) 600-400 MG-UNIT tablet Take 1 tablet by mouth 2 (two) times daily.  . feeding supplement, ENSURE ENLIVE, (ENSURE ENLIVE) LIQD Take 237 mLs by mouth daily at 2 PM.  . fluticasone (FLONASE) 50 MCG/ACT nasal spray Place 1 spray into both nostrils as needed for allergies.   . hydrocortisone 2.5 % cream Apply 1 application topically 2 (two) times daily as needed. Apply to affected area on face  . LORazepam (ATIVAN) 2 MG tablet Take 2 mg by mouth. Take 1 tablet by mouth in 24 hours, if hallucinations or psychosis persist. Then take daily as needed.  . metoprolol tartrate (LOPRESSOR) 25 MG tablet Take 25 mg by mouth 2 (two) times daily.  . mirtazapine (REMERON) 15 MG tablet Take 1 tablet (15 mg total) by mouth at bedtime.  . Multiple Vitamins-Minerals (I-VITE) TABS Take 1 tablet by mouth daily.  Marland Kitchen nystatin (NYSTATIN) powder Apply topically daily as needed. Under left breast   . omeprazole (PRILOSEC) 20 MG capsule Take 20 mg by mouth daily.  . polyethylene glycol (MIRALAX / GLYCOLAX) packet Take 17 g by mouth daily.  . QUEtiapine (SEROQUEL) 50 MG tablet Take 50 mg by mouth 2 (two) times daily.  . Rivaroxaban (XARELTO) 15 MG TABS tablet Take 1 tablet (15 mg total) by mouth daily at 6 PM.  . sertraline (ZOLOFT) 100 MG tablet Take 1 tablet (100 mg total) by mouth daily at 6 PM.  . Simethicone (GAS RELIEF 80 PO) Take 80 mg by mouth 4 (four) times daily - after meals and at bedtime. Take one tablet after meals and at  bedtime for gas relief   . vitamin B-12 (CYANOCOBALAMIN) 1000 MCG tablet Take 1 tablet (1,000 mcg total) by mouth daily.  . [DISCONTINUED] metoprolol tartrate (LOPRESSOR) 12.5 mg TABS tablet Take 12.5 mg by mouth 2 (two) times daily. For HTN  . [DISCONTINUED] QUEtiapine (SEROQUEL) 25 MG tablet Take 1 tablet (25 mg total) by mouth 3 (three) times daily.   No facility-administered encounter medications on file as of 08/14/2016.     Review of Systems  Unable to perform ROS: Dementia (limited)  Constitutional: Negative for chills, diaphoresis and fever.  HENT: Negative for congestion, mouth sores, sore throat and trouble swallowing.   Respiratory: Negative for cough, shortness of breath and wheezing.   Cardiovascular: Negative for chest pain  and leg swelling.  Gastrointestinal: Negative for abdominal pain, constipation, nausea and vomiting.       Last bowel movement was this morning. Poor appetite   Genitourinary: Negative for dysuria.  Musculoskeletal: Positive for gait problem. Negative for arthralgias and back pain.  Neurological: Positive for weakness. Negative for seizures, syncope, light-headedness and headaches.       History of CVA with residual dysarthria and weakness  Hematological: Bruises/bleeds easily.  Psychiatric/Behavioral: Positive for agitation, behavioral problems, confusion and hallucinations. The patient is nervous/anxious.        Has visual hallucination and sees people trying to come to hurt her    Vitals:   08/14/16 1118  BP: 130/70  Pulse: 82  Resp: 20  Temp: 97.8 F (36.6 C)  TempSrc: Oral  SpO2: 96%  Weight: 153 lb (69.4 kg)  Height: 5\' 3"  (1.6 m)   Body mass index is 27.1 kg/m. Physical Exam  Constitutional: She appears well-developed and well-nourished. No distress.  HENT:  Head: Normocephalic and atraumatic.  Right Ear: External ear normal.  Left Ear: External ear normal.  Mouth/Throat: Oropharynx is clear and moist.  No oral thrush  Eyes: Pupils  are equal, round, and reactive to light. Conjunctivae and EOM are normal.  Neck: Normal range of motion. Neck supple. No thyromegaly present.  Cardiovascular: Normal rate and regular rhythm.   No murmur heard. Pulmonary/Chest: Effort normal and breath sounds normal.  Abdominal: Soft. Bowel sounds are normal. She exhibits no distension. There is no tenderness. There is no rebound and no guarding.  Musculoskeletal: She exhibits edema.  Needs 1 person assistance with transfer. Can move all 4 extremities, unsteady gait, mild stoop with wide based gait, uses rolling walker with seat and brake, trace leg edema  Lymphadenopathy:    She has no cervical adenopathy.  Neurological: She is alert.  Oriented only to self, dysarthria present, weakness more prominent to right side.   Skin: Skin is warm and dry. She is not diaphoretic.  Easy bruising  Psychiatric:  Appears nervous, hallucinating, poor attention span    Labs reviewed: Basic Metabolic Panel:  Recent Labs  07/13/16 1420 07/14/16 0638 07/15/16 0409 07/17/16 0627 07/24/16 08/13/16  NA 140 142 138 139 142 137  K 3.5 3.7 3.3* 3.8 3.6 3.7  CL 109 111 107 108  --   --   CO2 23 23 23 26   --   --   GLUCOSE 113* 94 88 97  --   --   BUN 10 7 9 10 11 14   CREATININE 0.99 1.01* 1.04* 0.91 1.1 1.0  CALCIUM 9.2 8.8* 9.0 9.0  --   --   MG 2.4  --   --   --   --   --   PHOS 2.3*  --   --   --   --   --    Liver Function Tests:  Recent Labs  07/13/16 1420 07/24/16 08/13/16  AST 25 15 17   ALT 16 13 16   ALKPHOS 110 92 108  BILITOT 0.8  --   --   PROT 6.8  --   --   ALBUMIN 4.2  --   --    No results for input(s): LIPASE, AMYLASE in the last 8760 hours. No results for input(s): AMMONIA in the last 8760 hours. CBC:  Recent Labs  07/13/16 1420  07/14/16 0638 07/15/16 0409 07/24/16 08/13/16  WBC 8.2  --  5.8 6.5 5.3 6.8  NEUTROABS 5.7  --   --   --   --   --  HGB 14.4  --  13.2 13.3 13.1 13.5  HCT 43.9  < > 41.3 41.3 39 41  MCV  94.2  --  97.9 95.6  --   --   PLT 213  --  210 191 222 264  < > = values in this interval not displayed. Cardiac Enzymes: No results for input(s): CKTOTAL, CKMB, CKMBINDEX, TROPONINI in the last 8760 hours. BNP: Invalid input(s): POCBNP Lab Results  Component Value Date   HGBA1C 5.5 08/02/2015   Lab Results  Component Value Date   TSH 1.57 12/25/2015   Lab Results  Component Value Date   VITAMINB12 192 07/14/2016   No results found for: FOLATE No results found for: IRON, TIBC, FERRITIN  Imaging and Procedures obtained prior to SNF admission: Dg Chest 2 View  Result Date: 07/13/2016 CLINICAL DATA:  Patient very confused and unable to provide history. Hallucinations and possible UTI. EXAM: CHEST  2 VIEW COMPARISON:  08/20/2014. FINDINGS: Low volume film. Cardiopericardial silhouette is at upper limits of normal for size. There is pulmonary vascular congestion without overt pulmonary edema. Subsegmental atelectasis noted left lung base. No pleural effusion. The visualized bony structures of the thorax are intact. IMPRESSION: Low lung volumes with borderline cardiomegaly and vascular congestion. Electronically Signed   By: Misty Stanley M.D.   On: 07/13/2016 14:54   Ct Head Wo Contrast  Result Date: 07/13/2016 CLINICAL DATA:  Hallucination, confusion. Possible urinary tract infection. History of hypertension and stroke. EXAM: CT HEAD WITHOUT CONTRAST TECHNIQUE: Contiguous axial images were obtained from the base of the skull through the vertex without intravenous contrast. COMPARISON:  None. FINDINGS: BRAIN: No intraparenchymal hemorrhage, mass effect nor midline shift. LEFT temporoparietal and occipital lobe encephalomalacia with ex vacuo dilatation LEFT atrium and occipital horn, no hydrocephalus. Patchy supratentorial white matter hypodensities exclusive of the aforementioned abnormality compatible with mild chronic small vessel ischemic disease. No acute large vascular territory infarct.  Similar prominent RIGHT supratentorial cerebral spinal fluid space suggesting old hygroma without mass effect, stable from prior imaging. Basal cisterns are patent. VASCULAR: Mild calcific atherosclerosis of the carotid siphons. SKULL: No skull fracture. No significant scalp soft tissue swelling. SINUSES/ORBITS: The mastoid air-cells and included paranasal sinuses are well-aerated.The included ocular globes and orbital contents are non-suspicious. Status post bilateral ocular lens implants OTHER: None. IMPRESSION: 1. No acute intracranial process. 2. Stable examination including old LEFT PCA infarct and LEFT posterior watershed versus LEFT MCA territory infarcts. Electronically Signed   By: Elon Alas M.D.   On: 07/13/2016 16:03   Ct Abdomen Pelvis W Contrast  Result Date: 07/13/2016 CLINICAL DATA:  Possible urinary tract infection. Right lower quadrant pain. Confusion. EXAM: CT ABDOMEN AND PELVIS WITH CONTRAST TECHNIQUE: Multidetector CT imaging of the abdomen and pelvis was performed using the standard protocol following bolus administration of intravenous contrast. CONTRAST:  143mL ISOVUE-300 IOPAMIDOL (ISOVUE-300) INJECTION 61% COMPARISON:  None. FINDINGS: Lower chest: Bibasilar atelectasis. Cardiomegaly, accentuated by a pectus excavatum deformity. Tiny hiatal hernia. Hepatobiliary: Mild degradation secondary to patient arm position, not raised above the head. Mild hepatomegaly at 18.5 cm craniocaudal. Suspect mild hepatic steatosis. Too small to characterize right hepatic lobe lesion. Normal gallbladder, without biliary ductal dilatation. Pancreas: Normal, without mass or ductal dilatation. Spleen: Normal in size, without focal abnormality. Adrenals/Urinary Tract: Normal adrenal glands. At least partially duplicated left renal collecting system. Mild right-sided hydroureteronephrosis. This continues to the level of the pelvic brim. A calcification in this region measures 4 mm, including on image 75/  series 7. Difficult to differentiate if this is vascular or within the ureter. The distal ureter is normal in caliber. Normal urinary bladder. Stomach/Bowel: Normal remainder of the stomach. Scattered colonic diverticula. Colonic stool burden suggests constipation. Normal terminal ileum. Appendectomy. Normal small bowel. Vascular/Lymphatic: Aortic and branch vessel atherosclerosis. No abdominopelvic adenopathy. Reproductive: Hysterectomy.  No adnexal mass. Other: No significant free fluid. Musculoskeletal: Lumbosacral spondylosis with disc bulge at L4-5. IMPRESSION: 1. Mild right-sided hydroureteronephrosis. This continues to the level of a calcification which is either within or adjacent (vascular) to the mid to distal right ureter. Given size transition in this area, favored to represent a ureteric stone. 2.  Possible constipation. 3.  Aortic Atherosclerosis (ICD10-I70.0). 4. Hepatomegaly and probable mild hepatic steatosis. Electronically Signed   By: Abigail Miyamoto M.D.   On: 07/13/2016 16:27    Assessment/Plan  Visual hallucination With ongoing new hallucinations Infectious workup has been negative so far, u/a is pending. Currently on seroquel 50 mg bid. Increase evening dosing to 75 mg daily and monitor.   Depression and anxiety Appears anxious. Currently on sertraline 100 mg daily and mirtazapine 15 mg daily. Psych evaluation.   gerd Continue omeprazole 20 mg daily and monitor  Constipation Recently had obstipation in hospital. Monitor BM. C/w simethicone with meals. Continue miralax daily and dulcolax suppository as needed.   HTN Continue metoprolol tartrate 25 mg bid, monitor bp  Dementia with behavioral disturbance  Provide supportive care. Fall precautions and pressure ulcer prophylaxis. Medication change as above  History of CVA Continue antihypertensive and statin. Continue xarelto for secondary stroke prophylaxis  History of DVT Continue xarelto lifelong with history of protein  c and S deficiency  B12 deficiency  Continue b12 supplement 1000 mcg daily  Allergic rhinitis Continue fluticasone nasal spray as needed and monitor   Family/ staff Communication: reviewed care plan with patient and charge nurse.    Labs/tests ordered: pending u/a with c/s   I spent 35 minutes in total face-to-face time with the patient, more than 50% of which was spent in counseling and coordination of care, reviewing test results, reviewing medication and discussing or reviewing the diagnosis with patient.     Blanchie Serve, MD Internal Medicine [facility] [address] [place], [place] [address] Cell Phone (Monday-Friday 8 am - 5 pm): [phone] On Call: [phone] and follow prompts after 5 pm and on weekends Office Phone: [phone] Office Fax: [phone]

## 2016-08-14 NOTE — Assessment & Plan Note (Signed)
Stable, continue Omeprazole 20mg daily.  

## 2016-08-14 NOTE — Progress Notes (Signed)
Location:  [facility] Room Number: [place]:  SNF (31) Provider:  Amyrah Pinkhasov, Manxie  NP  Blanchie Serve, MD  Patient Care Team: Blanchie Serve, MD as PCP - General (Internal Medicine) Netanya Yazdani X, NP as Nurse Practitioner (Internal Medicine) Juluis Rainier as Consulting Physician (Optometry) Monna Fam, MD as Consulting Physician (Ophthalmology)  Extended Emergency Contact Information Primary Emergency Contact: [facility] Address: [address], [place] [address] [name] of [place] Phone: [phone] Work Phone: [phone] Mobile Phone: [phone] Relation: Daughter  Code Status:  DNR Goals of care: Advanced Directive information Advanced Directives 08/14/2016  Does Patient Have a Medical Advance Directive? Yes  Type of Paramedic of [place];Out of facility DNR (pink MOST or yellow form);Living will  Does patient want to make changes to medical advance directive? No - Patient declined  Copy of [place] in Chart? Yes  Pre-existing out of facility DNR order (yellow form or pink MOST form) Yellow form placed in chart (order not valid for inpatient use)     Chief Complaint  Patient presents with  . Acute Visit    hallucinations    HPI:  Pt is a 83 y.o. female seen today for an acute visit for hallucination episodes, I was called to the patient's room, the patient was talking to the person who was not present to others, the patient stated the person is choking her, she was  terrified, and c/o pain in her chest and all over. Given hx of the patient GERD with complained of "chest pain" relieved with Mylanta in the past, her pain was not relieved with Mylanta 30mg  x2, subsequently she was given NTG 0.4mg  SL x1 with no relief. She stopped c/o chest pain when she was taken out of her room in wheelchair by staff. EKG 08/13/16 showed sinus rhythm, HR 66 bpm, no  significant ST-T changes. 08/13/16 CXR mild stable cardiomegaly without overt congestive heart failure, no acute parenchymal pathology evident. 08/13/16 Na 137, K 3.7, Bun 14, creat 0.99. Pending UA CBC. The patient was transferred to [place] subsequently for care needs where she was discharged from a week ago. She is afebrile, no O2 desaturation, no new focal weakness.     The patient was admitted to [facility] 07/17/16 to SNF following hospital stay 07/13/16/ to 07/17/16 for acute encephalopathy, visual hallucination. She was discharge to [place] a week ago since she was been stable.    The patient was hospitalized 07/13/16 to 07/17/16 for acute encephalopathy with visual hallucinations, Vit B12 po started in setting of lower end of normal, Seroquel started, Mirtazapine15/7.5mg  /Sertraline 100/75mg  were increased to manage her behaviors per psych consultation, the patient has no visual hallucination since last seen 07/22/16 by Dr. Bubba Camp.    Past Medical History:  Diagnosis Date  . Acute encephalopathy   . Aphasia following unspecified cerebrovascular disease   . 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 Depression   . Dysphagia following cerebrovascular disease   . Hepatic steatosis   . Hydroureteronephrosis   . Hypertension   . Insomnia   . Macular degeneration   . Major depressive disorder, recurrent ([place])   . Neuralgia and neuritis, unspecified (CODE)   . Nontraumatic intracranial hemorrhage ([place])   . Stroke ([place])   . 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   . Unspecified type  of carcinoma in situ of unspecified breast    right   Past Surgical History:  Procedure Laterality Date  . ABDOMINAL HYSTERECTOMY    . APPENDECTOMY    . BREAST SURGERY    . MASTECTOMY Right     Allergies  Allergen Reactions  . Hydrocodone Nausea And Vomiting  . Morphine And Related Nausea And Vomiting  . Actonel [Risedronate Sodium] Other (See Comments)    unknown   . Darvon [Propoxyphene Hcl] Other (See Comments)    unknown  . Oxycodone Hcl Other (See Comments)    unknown  . Pneumovax [Pneumococcal Polysaccharide Vaccine] Other (See Comments)    unknown    Outpatient Encounter Prescriptions as of 08/13/2016  Medication Sig  . acetaminophen (TYLENOL) 325 MG tablet Take 650 mg by mouth every 4 (four) hours as needed for pain.  Marland Kitchen atorvastatin (LIPITOR) 10 MG tablet Take 10 mg by mouth daily.  . bisacodyl (DULCOLAX) 10 MG suppository Place 1 suppository (10 mg total) rectally daily as needed for moderate constipation.  . Calcium Carbonate-Vitamin D (CALTRATE 600+D) 600-400 MG-UNIT tablet Take 1 tablet by mouth 2 (two) times daily.  . feeding supplement, ENSURE ENLIVE, (ENSURE ENLIVE) LIQD Take 237 mLs by mouth daily at 2 PM.  . fluticasone (FLONASE) 50 MCG/ACT nasal spray Place 1 spray into both nostrils as needed for allergies.   . hydrocortisone 2.5 % cream Apply 1 application topically 2 (two) times daily as needed. Apply to affected area on face  . LORazepam (ATIVAN) 2 MG tablet Take 2 mg by mouth. Take 1 tablet by mouth in 24 hours, if hallucinations or psychosis persist. Then take daily as needed.  . metoprolol tartrate (LOPRESSOR) 25 MG tablet Take 25 mg by mouth 2 (two) times daily.  . mirtazapine (REMERON) 15 MG tablet Take 1 tablet (15 mg total) by mouth at bedtime.  . Multiple Vitamins-Minerals (I-VITE) TABS Take 1 tablet by mouth daily.  Marland Kitchen nystatin (NYSTATIN) powder Apply topically daily as needed. Under left breast   . omeprazole (PRILOSEC) 20 MG capsule Take 20 mg by mouth daily.  . polyethylene glycol (MIRALAX / GLYCOLAX) packet Take 17 g by mouth daily.  . QUEtiapine (SEROQUEL) 50 MG tablet Take 50 mg by mouth 2 (two) times daily.  . Rivaroxaban (XARELTO) 15 MG TABS tablet Take 1 tablet (15 mg total) by mouth daily at 6 PM.  . sertraline (ZOLOFT) 100 MG tablet Take 1 tablet (100 mg total) by mouth daily at 6 PM.  . Simethicone (GAS RELIEF  80 PO) Take 80 mg by mouth 4 (four) times daily - after meals and at bedtime. Take one tablet after meals and at bedtime for gas relief   . vitamin B-12 (CYANOCOBALAMIN) 1000 MCG tablet Take 1 tablet (1,000 mcg total) by mouth daily.   No facility-administered encounter medications on file as of 08/13/2016.     Review of Systems  Unable to perform ROS: Dementia (limited)  Constitutional: Positive for activity change. Negative for appetite change, chills, diaphoresis, fatigue and fever.  HENT: Negative for congestion, hearing loss, sore throat and trouble swallowing.   Eyes: Negative for pain, discharge, redness, itching and visual disturbance.  Respiratory: Negative for cough and shortness of breath.   Cardiovascular: Positive for chest pain. Negative for palpitations and leg swelling.       Atypical chest pain and pain all over  Gastrointestinal: Negative for abdominal pain, constipation, nausea and vomiting.  Genitourinary: Negative for difficulty urinating and dysuria.  Musculoskeletal: Positive for  gait problem. Negative for arthralgias and back pain.  Skin: Negative for rash and wound.  Neurological: Positive for weakness. Negative for seizures, light-headedness and headaches.       History of CVA with residual dysarthria and weakness  Hematological: Does not bruise/bleed easily.  Psychiatric/Behavioral: Positive for agitation, confusion, hallucinations and sleep disturbance. Negative for behavioral problems and suicidal ideas. The patient is nervous/anxious.        X2 3am, 10 am    Immunization History  Administered Date(s) Administered  . Influenza-Unspecified 11/20/2013, 09/26/2014, 10/24/2015   Pertinent  Health Maintenance Due  Topic Date Due  . DEXA SCAN  07/18/1994  . PNA vac Low Risk Adult (1 of 2 - PCV13) 07/18/1994  . INFLUENZA VACCINE  08/06/2016   No flowsheet data found. Functional Status Survey:    Vitals:   08/13/16 1145  BP: 120/66  Pulse: 84  Resp: 16    Temp: 98.6 F (37 C)  Weight: 153 lb (69.4 kg)  Height: 5\' 1"  (1.549 m)   Body mass index is 28.91 kg/m. Physical Exam  Constitutional: She appears well-developed and well-nourished. No distress.  HENT:  Head: Normocephalic and atraumatic.  Right Ear: External ear normal.  Left Ear: External ear normal.  Mouth/Throat: Oropharynx is clear and moist.  Eyes: Pupils are equal, round, and reactive to light. Conjunctivae and EOM are normal.  Neck: Normal range of motion. Neck supple. No thyromegaly present.  Cardiovascular: Normal rate and regular rhythm.   No murmur heard. Pulmonary/Chest: Effort normal and breath sounds normal.  Abdominal: Soft. Bowel sounds are normal. She exhibits no distension. There is no tenderness.  Musculoskeletal: She exhibits edema.  uses rolling walker with seat and brake, trace edema BLE  Neurological: She is alert. No cranial nerve deficit. Coordination normal.  weakness more prominent to right side.   Skin: Skin is warm and dry. She is not diaphoretic.  Psychiatric:  Acute onset of visual hallucination    Labs reviewed:  Recent Labs  07/13/16 1420 07/14/16 0638 07/15/16 0409 07/17/16 0627 07/24/16 08/13/16  NA 140 142 138 139 142 137  K 3.5 3.7 3.3* 3.8 3.6 3.7  CL 109 111 107 108  --   --   CO2 23 23 23 26   --   --   GLUCOSE 113* 94 88 97  --   --   BUN 10 7 9 10 11 14   CREATININE 0.99 1.01* 1.04* 0.91 1.1 1.0  CALCIUM 9.2 8.8* 9.0 9.0  --   --   MG 2.4  --   --   --   --   --   PHOS 2.3*  --   --   --   --   --     Recent Labs  07/13/16 1420 07/24/16 08/13/16  AST 25 15 17   ALT 16 13 16   ALKPHOS 110 92 108  BILITOT 0.8  --   --   PROT 6.8  --   --   ALBUMIN 4.2  --   --     Recent Labs  07/13/16 1420  07/14/16 0638 07/15/16 0409 07/24/16 08/13/16  WBC 8.2  --  5.8 6.5 5.3 6.8  NEUTROABS 5.7  --   --   --   --   --   HGB 14.4  --  13.2 13.3 13.1 13.5  HCT 43.9  < > 41.3 41.3 39 41  MCV 94.2  --  97.9 95.6  --   --  PLT 213  --  210 191 222 264  < > = values in this interval not displayed. Lab Results  Component Value Date   TSH 1.57 12/25/2015   Lab Results  Component Value Date   HGBA1C 5.5 08/02/2015   Lab Results  Component Value Date   CHOL 193 12/25/2015   HDL 45 12/25/2015   LDLCALC 112 12/25/2015   TRIG 182 (A) 12/25/2015    Significant Diagnostic Results in last 30 days:  EKG 08/13/16 showed sinus rhythm, HR 66 bpm, no significant ST-T changes. 08/13/16 CXR mild stable cardiomegaly without overt congestive heart failure, no acute parenchymal pathology evident.  Assessment/Plan Hallucination hallucination episodes, I was called to the patient's room, the patient was talking to the person who was not present to others, the patient stated the person is choking her, she is terrified, and c/o pain in her chest and all over. Given hx of the patient GERD with complained of "chest pain" relieved with Mylanta in the past, her pain was not relieved with Mylanta 30mg  x2, subsequently she was given NTG 0.4mg  SL x1 with no relief. She stopped c/o chest pain when she was taken out of her room in wheelchair by staff. EKG 08/13/16 showed sinus rhythm, HR 66 bpm, no significant ST-T changes. 08/13/16 CXR mild stable cardiomegaly without overt congestive heart failure, no acute parenchymal pathology evident. 08/13/16 Na 137, K 3.7, Bun 14, creat 0.99. Pending UA(c/o pain allover, unable to provide accurate HPI due to dementia and acute hallucination), pending CBC. The patient was transferred to [place] subsequently for care needs where she was discharged from a week ago.  Will have Lorazepam 2mg  x1 po in the next 24 hours for hallucination crisis or ED evaluation and treatment if POA desires.    Depression due to dementia Acute hallucinations, ? Psychotic features, continue Sertraline 100mg , Mirtazapine 15mg , Seroquel 50mg  bid. May increase Seroquel to 100mg /50mg  at night, continue 50mg  qam if no  improvement.   GERD (gastroesophageal reflux disease) Stable, continue Omeprazole 20mg  daily  Chest pain  the patient stated "the person: is choking her", she is terrified, and c/o pain in her chest and all over. Given hx of the patient GERD with complained of "chest pain" relieved with Mylanta in the past, her pain was not relieved with Mylanta 30mg  x2, subsequently she was given NTG 0.4mg  SL x1 with no relief. Obtain CXR, EKG to evaluate possible PNA, ischemic cardio changes     Family/ staff Communication: plan of care reviewed with the patient, POA daughter, charge nurse  Labs/tests ordered:  CXR EKG CBC CMP UA C/S  Time spend 45 minutes

## 2016-08-14 NOTE — Assessment & Plan Note (Signed)
the patient stated "the person: is choking her", she is terrified, and c/o pain in her chest and all over. Given hx of the patient GERD with complained of "chest pain" relieved with Mylanta in the past, her pain was not relieved with Mylanta 30mg  x2, subsequently she was given NTG 0.4mg  SL x1 with no relief. Obtain CXR, EKG to evaluate possible PNA, ischemic cardio changes

## 2016-08-14 NOTE — Patient Instructions (Signed)
Opened in error

## 2016-08-14 NOTE — Progress Notes (Deleted)
Location:  [facility] Room Number: 28 Place of Service:  SNF (31) Provider:  Mast, Manxie  NP  Blanchie Serve, MD  Patient Care Team: Blanchie Serve, MD as PCP - General (Internal Medicine) Mast, Man X, NP as Nurse Practitioner (Internal Medicine) Juluis Rainier as Consulting Physician (Optometry) Monna Fam, MD as Consulting Physician (Ophthalmology)  Extended Emergency Contact Information Primary Emergency Contact: [facility] Address: [address], [place] [address] [name] of [place] Phone: [phone] Work Phone: [phone] Mobile Phone: [phone] Relation: Daughter  Code Status:  DNR Goals of care: Advanced Directive information Advanced Directives 08/14/2016  Does Patient Have a Medical Advance Directive? Yes  Type of Paramedic of [place];Out of facility DNR (pink MOST or yellow form);Living will  Does patient want to make changes to medical advance directive? No - Patient declined  Copy of [place] in Chart? Yes  Pre-existing out of facility DNR order (yellow form or pink MOST form) Yellow form placed in chart (order not valid for inpatient use)     Chief Complaint  Patient presents with  . Acute Visit    Hallucinations    HPI:  Pt is a 83 y.o. female seen today for an acute visit for    Past Medical History:  Diagnosis Date  . Acute encephalopathy   . Aphasia following unspecified cerebrovascular disease   . 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 Depression   . Dysphagia following cerebrovascular disease   . Hepatic steatosis   . Hydroureteronephrosis   . Hypertension   . Insomnia   . Macular degeneration   . Major depressive disorder, recurrent ([place])   . Neuralgia and neuritis, unspecified ([place])   . Nontraumatic intracranial hemorrhage ([place])   . Stroke ([place])   . Unspecified type of carcinoma in situ of  unspecified breast   . 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 ABDOMINAL HYSTERECTOMY    . APPENDECTOMY    . BREAST SURGERY    . MASTECTOMY Right     Allergies  Allergen Reactions  . Hydrocodone Nausea And Vomiting  . Morphine And Related Nausea And Vomiting  . Actonel [Risedronate Sodium] Other (See Comments)    unknown  . Darvon [Propoxyphene Hcl] Other (See Comments)    unknown  . Oxycodone Hcl Other (See Comments)    unknown  . Pneumovax [Pneumococcal Polysaccharide Vaccine] Other (See Comments)    unknown    Outpatient Encounter Prescriptions as of 08/14/2016  Medication Sig  . acetaminophen (TYLENOL) 325 MG tablet Take 650 mg by mouth every 4 (four) hours as needed for pain.  Marland Kitchen atorvastatin (LIPITOR) 10 MG tablet Take 10 mg by mouth daily.  . bisacodyl (DULCOLAX) 10 MG suppository Place 1 suppository (10 mg total) rectally daily as needed for moderate constipation.  . Calcium Carbonate-Vitamin D (CALTRATE 600+D) 600-400 MG-UNIT tablet Take 1 tablet by mouth 2 (two) times daily.  . feeding supplement, ENSURE ENLIVE, (ENSURE ENLIVE) LIQD Take 237 mLs by mouth daily at 2 PM.  . fluticasone (FLONASE) 50 MCG/ACT nasal spray Place 1 spray into both nostrils as needed for allergies.   . hydrocortisone 2.5 % cream Apply 1 application topically 2 (two) times daily as needed. Apply to affected area on face  . LORazepam (ATIVAN) 2 MG  tablet Take 2 mg by mouth. Take 1 tablet by mouth in 24 hours, if hallucinations or psychosis persist. Then take daily as needed.  . metoprolol tartrate (LOPRESSOR) 12.5 mg TABS tablet Take 12.5 mg by mouth 2 (two) times daily. For HTN  . mirtazapine (REMERON) 15 MG tablet Take 1 tablet (15 mg total) by mouth at bedtime.  . Multiple Vitamins-Minerals (I-VITE) TABS Take 1 tablet by mouth daily.  Marland Kitchen nystatin (NYSTATIN) powder Apply topically daily as needed. Under left breast    . omeprazole (PRILOSEC) 20 MG capsule Take 20 mg by mouth daily.  . polyethylene glycol (MIRALAX / GLYCOLAX) packet Take 17 g by mouth daily.  . QUEtiapine (SEROQUEL) 25 MG tablet Take 1 tablet (25 mg total) by mouth 3 (three) times daily.  . Rivaroxaban (XARELTO) 15 MG TABS tablet Take 1 tablet (15 mg total) by mouth daily at 6 PM.  . sertraline (ZOLOFT) 100 MG tablet Take 1 tablet (100 mg total) by mouth daily at 6 PM.  . Simethicone (GAS RELIEF 80 PO) Take 80 mg by mouth 4 (four) times daily - after meals and at bedtime. Take one tablet after meals and at bedtime for gas relief   . vitamin B-12 (CYANOCOBALAMIN) 1000 MCG tablet Take 1 tablet (1,000 mcg total) by mouth daily.   No facility-administered encounter medications on file as of 08/14/2016.     Review of Systems  Immunization History  Administered Date(s) Administered  . Influenza-Unspecified 11/20/2013, 09/26/2014, 10/24/2015   Pertinent  Health Maintenance Due  Topic Date Due  . DEXA SCAN  07/18/1994  . PNA vac Low Risk Adult (1 of 2 - PCV13) 07/18/1994  . INFLUENZA VACCINE  08/06/2016   No flowsheet data found. Functional Status Survey:    Vitals:   08/14/16 1108  BP: 130/70  Pulse: 82  Resp: 20  Temp: 98.8 F (37.1 C)  SpO2: 96%  Weight: 153 lb (69.4 kg)  Height: 5\' 3"  (1.6 m)   Body mass index is 27.1 kg/m. Physical Exam  Labs reviewed:  Recent Labs  07/13/16 1420 07/14/16 0638 07/15/16 0409 07/17/16 0627 07/24/16 08/13/16  NA 140 142 138 139 142 137  K 3.5 3.7 3.3* 3.8 3.6 3.7  CL 109 111 107 108  --   --   CO2 23 23 23 26   --   --   GLUCOSE 113* 94 88 97  --   --   BUN 10 7 9 10 11 14   CREATININE 0.99 1.01* 1.04* 0.91 1.1 1.0  CALCIUM 9.2 8.8* 9.0 9.0  --   --   MG 2.4  --   --   --   --   --   PHOS 2.3*  --   --   --   --   --     Recent Labs  07/13/16 1420 07/24/16 08/13/16  AST 25 15 17   ALT 16 13 16   ALKPHOS 110 92 108  BILITOT 0.8  --   --   PROT 6.8  --   --   ALBUMIN 4.2   --   --     Recent Labs  07/13/16 1420  07/14/16 0638 07/15/16 0409 07/24/16 08/13/16  WBC 8.2  --  5.8 6.5 5.3 6.8  NEUTROABS 5.7  --   --   --   --   --   HGB 14.4  --  13.2 13.3 13.1 13.5  HCT 43.9  < > 41.3 41.3 39 41  MCV 94.2  --  97.9 95.6  --   --   PLT 213  --  210 191 222 264  < > = values in this interval not displayed. Lab Results  Component Value Date   TSH 1.57 12/25/2015   Lab Results  Component Value Date   HGBA1C 5.5 08/02/2015   Lab Results  Component Value Date   CHOL 193 12/25/2015   HDL 45 12/25/2015   LDLCALC 112 12/25/2015   TRIG 182 (A) 12/25/2015    Significant Diagnostic Results in last 30 days:  No results found.  Assessment/Plan There are no diagnoses linked to this encounter.   Family/ staff Communication:  Labs/tests ordered:

## 2016-08-21 ENCOUNTER — Encounter: Payer: Self-pay | Admitting: Nurse Practitioner

## 2016-08-21 ENCOUNTER — Non-Acute Institutional Stay (SKILLED_NURSING_FACILITY): Payer: Medicare Other | Admitting: Nurse Practitioner

## 2016-08-21 DIAGNOSIS — F331 Major depressive disorder, recurrent, moderate: Secondary | ICD-10-CM | POA: Diagnosis not present

## 2016-08-21 DIAGNOSIS — B372 Candidiasis of skin and nail: Secondary | ICD-10-CM

## 2016-08-21 DIAGNOSIS — F29 Unspecified psychosis not due to a substance or known physiological condition: Secondary | ICD-10-CM

## 2016-08-21 NOTE — Assessment & Plan Note (Signed)
Persisted psychosis, will change to Risperdal/Seroqeul, continue Sertraline 100mg , Mirtazapine 15mg 

## 2016-08-21 NOTE — Assessment & Plan Note (Signed)
Satellite pattern rash seen under the left breast skin fold, continue Nystatin, assist the patient with personal hygiene, may consider Diflucan po if no better.

## 2016-08-21 NOTE — Progress Notes (Signed)
Location:  [facility] Room Number: [place]:  SNF (31) Provider:  Mast, Manxie  NP  Blanchie Serve, MD  Patient Care Team: Blanchie Serve, MD as PCP - General (Internal Medicine) Mast, Man X, NP as Nurse Practitioner (Internal Medicine) Juluis Rainier as Consulting Physician (Optometry) Monna Fam, MD as Consulting Physician (Ophthalmology)  Extended Emergency Contact Information Primary Emergency Contact: [facility] Address: [address], [place] [address] [name] of [place] Phone: [phone] Work Phone: [phone] Mobile Phone: [phone] Relation: Daughter  Code Status:  DNR Goals of care: Advanced Directive information Advanced Directives 08/21/2016  Does Patient Have a Medical Advance Directive? Yes  Type of Paramedic of [place];Living will;Out of facility DNR (pink MOST or yellow form)  Does patient want to make changes to medical advance directive? No - Patient declined  Copy of [place] in Chart? Yes  Pre-existing out of facility DNR order (yellow form or pink MOST form) Yellow form placed in chart (order not valid for inpatient use)     Chief Complaint  Patient presents with  . Acute Visit    behaviors    HPI:  Pt is a 83 y.o. female seen today for an acute visit for    Past Medical History:  Diagnosis Date  . Acute encephalopathy   . Aphasia following unspecified cerebrovascular disease   . 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 Depression   . Dysphagia following cerebrovascular disease   . Hepatic steatosis   . Hydroureteronephrosis   . Hypertension   . Insomnia   . Macular degeneration   . Major depressive disorder, recurrent ([place])   . Neuralgia and neuritis, unspecified ([place])   . Nontraumatic intracranial hemorrhage ([place])   . Stroke ([place])   . Unspecified type of carcinoma in situ of unspecified  breast   . 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 ABDOMINAL HYSTERECTOMY    . APPENDECTOMY    . BREAST SURGERY    . MASTECTOMY Right     Allergies  Allergen Reactions  . Hydrocodone Nausea And Vomiting  . Morphine And Related Nausea And Vomiting  . Actonel [Risedronate Sodium] Other (See Comments)    unknown  . Darvon [Propoxyphene Hcl] Other (See Comments)    unknown  . Oxycodone Hcl Other (See Comments)    unknown  . Pneumovax [Pneumococcal Polysaccharide Vaccine] Other (See Comments)    unknown    Outpatient Encounter Prescriptions as of 08/21/2016  Medication Sig  . acetaminophen (TYLENOL) 325 MG tablet Take 650 mg by mouth every 4 (four) hours as needed for pain.  Marland Kitchen atorvastatin (LIPITOR) 10 MG tablet Take 10 mg by mouth daily.  . bisacodyl (DULCOLAX) 10 MG suppository Place 1 suppository (10 mg total) rectally daily as needed for moderate constipation.  . Calcium Carbonate-Vitamin D (CALTRATE 600+D) 600-400 MG-UNIT tablet Take 1 tablet by mouth 2 (two) times daily.  . feeding supplement, ENSURE ENLIVE, (ENSURE ENLIVE) LIQD Take 237 mLs by mouth daily at 2 PM.  . fluticasone (FLONASE) 50 MCG/ACT nasal spray Place 1 spray into both nostrils as needed for allergies.   . hydrocortisone 2.5 % cream Apply 1 application topically 2 (two) times daily as needed. Apply to affected area on face  . metoprolol tartrate (LOPRESSOR) 25  MG tablet Take 25 mg by mouth 2 (two) times daily.  . mirtazapine (REMERON) 15 MG tablet Take 1 tablet (15 mg total) by mouth at bedtime.  . Multiple Vitamins-Minerals (I-VITE) TABS Take 1 tablet by mouth daily.  Marland Kitchen nystatin (NYSTATIN) powder Apply topically daily as needed. Under left breast   . omeprazole (PRILOSEC) 20 MG capsule Take 20 mg by mouth daily.  . polyethylene glycol (MIRALAX / GLYCOLAX) packet Take 17 g by mouth daily.  . QUEtiapine (SEROQUEL) 50 MG tablet  Take 50 mg by mouth 2 (two) times daily.  . Rivaroxaban (XARELTO) 15 MG TABS tablet Take 1 tablet (15 mg total) by mouth daily at 6 PM.  . sertraline (ZOLOFT) 100 MG tablet Take 1 tablet (100 mg total) by mouth daily at 6 PM.  . Simethicone (GAS RELIEF 80 PO) Take 80 mg by mouth 4 (four) times daily - after meals and at bedtime. Take one tablet after meals and at bedtime for gas relief   . vitamin B-12 (CYANOCOBALAMIN) 1000 MCG tablet Take 1 tablet (1,000 mcg total) by mouth daily.  . [DISCONTINUED] LORazepam (ATIVAN) 2 MG tablet Take 2 mg by mouth. Take 1 tablet by mouth in 24 hours, if hallucinations or psychosis persist. Then take daily as needed.   No facility-administered encounter medications on file as of 08/21/2016.     Review of Systems  Immunization History  Administered Date(s) Administered  . Influenza-Unspecified 11/20/2013, 09/26/2014, 10/24/2015   Pertinent  Health Maintenance Due  Topic Date Due  . DEXA SCAN  07/18/1994  . PNA vac Low Risk Adult (1 of 2 - PCV13) 07/18/1994  . INFLUENZA VACCINE  08/06/2016   No flowsheet data found. Functional Status Survey:    Vitals:   08/21/16 1313  BP: 130/70  Pulse: 68  Resp: 16  Temp: 98.5 F (36.9 C)  Weight: 151 lb 9.6 oz (68.8 kg)  Height: 5\' 3"  (1.6 m)   Body mass index is 26.85 kg/m. Physical Exam  Labs reviewed:  Recent Labs  07/13/16 1420 07/14/16 0638 07/15/16 0409 07/17/16 0627 07/24/16 08/13/16  NA 140 142 138 139 142 137  K 3.5 3.7 3.3* 3.8 3.6 3.7  CL 109 111 107 108  --   --   CO2 23 23 23 26   --   --   GLUCOSE 113* 94 88 97  --   --   BUN 10 7 9 10 11 14   CREATININE 0.99 1.01* 1.04* 0.91 1.1 1.0  CALCIUM 9.2 8.8* 9.0 9.0  --   --   MG 2.4  --   --   --   --   --   PHOS 2.3*  --   --   --   --   --     Recent Labs  07/13/16 1420 07/24/16 08/13/16  AST 25 15 17   ALT 16 13 16   ALKPHOS 110 92 108  BILITOT 0.8  --   --   PROT 6.8  --   --   ALBUMIN 4.2  --   --     Recent Labs   07/13/16 1420  07/14/16 0638 07/15/16 0409 07/24/16 08/13/16  WBC 8.2  --  5.8 6.5 5.3 6.8  NEUTROABS 5.7  --   --   --   --   --   HGB 14.4  --  13.2 13.3 13.1 13.5  HCT 43.9  < > 41.3 41.3 39 41  MCV 94.2  --  97.9 95.6  --   --  PLT 213  --  210 191 222 264  < > = values in this interval not displayed. Lab Results  Component Value Date   TSH 1.57 12/25/2015   Lab Results  Component Value Date   HGBA1C 5.5 08/02/2015   Lab Results  Component Value Date   CHOL 193 12/25/2015   HDL 45 12/25/2015   LDLCALC 112 12/25/2015   TRIG 182 (A) 12/25/2015    Significant Diagnostic Results in last 30 days:  No results found.  Assessment/Plan 1. Psychosis, unspecified psychosis type   2. Moderate episode of recurrent major depressive disorder ([place])   3. Skin candidiasis     Family/ staff Communication:   Labs/tests ordered:

## 2016-08-21 NOTE — Progress Notes (Signed)
Location:  [facility] Room Number: [place]:  SNF (31) Provider:  Taniaya Rudder, Manxie  NP  Blanchie Serve, MD  Patient Care Team: Blanchie Serve, MD as PCP - General (Internal Medicine) Tishawn Friedhoff X, NP as Nurse Practitioner (Internal Medicine) Juluis Rainier as Consulting Physician (Optometry) Monna Fam, MD as Consulting Physician (Ophthalmology)  Extended Emergency Contact Information Primary Emergency Contact: [facility] Address: [address], [name] [address] [name] of [place] Phone: [phone] Work Phone: [phone] Mobile Phone: [phone] Relation: Daughter  Code Status:  DNR Goals of care: Advanced Directive information Advanced Directives 08/21/2016  Does Patient Have a Medical Advance Directive? Yes  Type of Paramedic of [place];Living will;Out of facility DNR (pink MOST or yellow form)  Does patient want to make changes to medical advance directive? No - Patient declined  Copy of [place] in Chart? Yes  Pre-existing out of facility DNR order (yellow form or pink MOST form) Yellow form placed in chart (order not valid for inpatient use)     Chief Complaint  Patient presents with  . Acute Visit    behaviors    HPI:  Pt is a 83 y.o. female seen today for an acute visit for persisted visual, tactile, olfactory hallucination, delusions, different smells, crying spells, choking herself, thinking someone else trying to kill her, fear of being attacked, stated they are going to come and get me, slept in the sofa of living room instead of her room. Not responding to Seroquel well.     Also c/o itching and pain under the left breast, using Nystatin powder, no better.    The patient was admitted to [facility] SNF [facility] 08/13/16 due to relapsed psychosis   The patient was admitted to [facility] 07/17/16 to SNF following hospital stay 07/13/16/ to  07/17/16 for acute encephalopathy, visual hallucination. She was discharge to [facility] a week ago since she was been stable.    The patient was hospitalized 07/13/16 to 07/17/16 for acute encephalopathy with visual hallucinations, Vit B12 po started in setting of lower end of normal, Seroquel started, Mirtazapine15/7.5mg  /Sertraline 100/75mg  were increased to manage her behaviors per psych consultation, the patient has no visual hallucination since last seen 07/22/16 by Dr. Bubba Camp.    Past Medical History:  Diagnosis Date  . Acute encephalopathy   . Aphasia following unspecified cerebrovascular disease   . 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 Depression   . Dysphagia following cerebrovascular disease   . Hepatic steatosis   . Hydroureteronephrosis   . Hypertension   . Insomnia   . Macular degeneration   . Major depressive disorder, recurrent ([place])   . Neuralgia and neuritis, unspecified (CODE)   . Nontraumatic intracranial hemorrhage ([place])   . Stroke ([place])   . 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   . 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 ABDOMINAL HYSTERECTOMY    . APPENDECTOMY    . BREAST SURGERY    . MASTECTOMY Right     Allergies  Allergen Reactions  . Hydrocodone Nausea And Vomiting  . Morphine And Related Nausea And Vomiting  . Actonel [Risedronate Sodium] Other (See Comments)    unknown  . Darvon [Propoxyphene Hcl] Other (See Comments)  unknown  . Oxycodone Hcl Other (See Comments)    unknown  . Pneumovax [Pneumococcal Polysaccharide Vaccine] Other (See Comments)    unknown    Outpatient Encounter Prescriptions as of 08/21/2016  Medication Sig  . acetaminophen (TYLENOL) 325 MG tablet Take 650 mg by mouth every 4 (four) hours as needed for pain.  Marland Kitchen atorvastatin (LIPITOR) 10 MG tablet Take 10 mg by mouth daily.  . bisacodyl (DULCOLAX) 10 MG  suppository Place 1 suppository (10 mg total) rectally daily as needed for moderate constipation.  . Calcium Carbonate-Vitamin D (CALTRATE 600+D) 600-400 MG-UNIT tablet Take 1 tablet by mouth 2 (two) times daily.  . feeding supplement, ENSURE ENLIVE, (ENSURE ENLIVE) LIQD Take 237 mLs by mouth daily at 2 PM.  . fluticasone (FLONASE) 50 MCG/ACT nasal spray Place 1 spray into both nostrils as needed for allergies.   . hydrocortisone 2.5 % cream Apply 1 application topically 2 (two) times daily as needed. Apply to affected area on face  . LORazepam (ATIVAN) 2 MG tablet Take 2 mg by mouth. Take 1 tablet by mouth in 24 hours, if hallucinations or psychosis persist. Then take daily as needed.  . metoprolol tartrate (LOPRESSOR) 25 MG tablet Take 25 mg by mouth 2 (two) times daily.  . mirtazapine (REMERON) 15 MG tablet Take 1 tablet (15 mg total) by mouth at bedtime.  . Multiple Vitamins-Minerals (I-VITE) TABS Take 1 tablet by mouth daily.  Marland Kitchen nystatin (NYSTATIN) powder Apply topically daily as needed. Under left breast   . omeprazole (PRILOSEC) 20 MG capsule Take 20 mg by mouth daily.  . polyethylene glycol (MIRALAX / GLYCOLAX) packet Take 17 g by mouth daily.  . QUEtiapine (SEROQUEL) 50 MG tablet Take 50 mg by mouth 2 (two) times daily.  . Rivaroxaban (XARELTO) 15 MG TABS tablet Take 1 tablet (15 mg total) by mouth daily at 6 PM.  . sertraline (ZOLOFT) 100 MG tablet Take 1 tablet (100 mg total) by mouth daily at 6 PM.  . Simethicone (GAS RELIEF 80 PO) Take 80 mg by mouth 4 (four) times daily - after meals and at bedtime. Take one tablet after meals and at bedtime for gas relief   . vitamin B-12 (CYANOCOBALAMIN) 1000 MCG tablet Take 1 tablet (1,000 mcg total) by mouth daily.   No facility-administered encounter medications on file as of 08/21/2016.     Review of Systems  Unable to perform ROS: Dementia (limited)  Constitutional: Negative for activity change, appetite change, fatigue and fever.  HENT:  Negative for congestion, sore throat and trouble swallowing.   Eyes: Negative for pain, redness, itching and visual disturbance.  Respiratory: Negative for cough and shortness of breath.   Cardiovascular: Negative for chest pain, palpitations and leg swelling.  Skin: Positive for rash. Negative for wound.       Rash under the left breast skin fold.   Neurological: Positive for weakness. Negative for seizures, light-headedness and headaches.       History of CVA with residual dysarthria and weakness  Psychiatric/Behavioral: Positive for behavioral problems, confusion and hallucinations. Negative for agitation, sleep disturbance and suicidal ideas. The patient is nervous/anxious.        X2 3am, 10 am    Immunization History  Administered Date(s) Administered  . Influenza-Unspecified 11/20/2013, 09/26/2014, 10/24/2015   Pertinent  Health Maintenance Due  Topic Date Due  . DEXA SCAN  07/18/1994  . PNA vac Low Risk Adult (1 of 2 - PCV13) 07/18/1994  . INFLUENZA VACCINE  08/06/2016  No flowsheet data found. Functional Status Survey:    Vitals:   08/21/16 1313  BP: 130/70  Pulse: 68  Resp: 16  Temp: 98.5 F (36.9 C)  Weight: 151 lb 9.6 oz (68.8 kg)  Height: 5\' 3"  (1.6 m)   Body mass index is 26.85 kg/m. Physical Exam  Constitutional: She appears well-developed and well-nourished.  HENT:  Mouth/Throat: Oropharynx is clear and moist.  Eyes: Pupils are equal, round, and reactive to light. Conjunctivae and EOM are normal.  Neck: Neck supple.  Cardiovascular: Normal rate and regular rhythm.   No murmur heard. Pulmonary/Chest: Effort normal and breath sounds normal.  Neurological: She is alert. No cranial nerve deficit. Coordination normal.  weakness more prominent to right side.   Skin: Skin is warm and dry. Rash noted.  Satellite pattern rash under the left breast skin fold.   Psychiatric:  Persisted visual, tactile, olfactory hallucinations, delusions, crying episodes,  fearfulness.     Labs reviewed:  Recent Labs  07/13/16 1420 07/14/16 0638 07/15/16 0409 07/17/16 0627 07/24/16 08/13/16  NA 140 142 138 139 142 137  K 3.5 3.7 3.3* 3.8 3.6 3.7  CL 109 111 107 108  --   --   CO2 23 23 23 26   --   --   GLUCOSE 113* 94 88 97  --   --   BUN 10 7 9 10 11 14   CREATININE 0.99 1.01* 1.04* 0.91 1.1 1.0  CALCIUM 9.2 8.8* 9.0 9.0  --   --   MG 2.4  --   --   --   --   --   PHOS 2.3*  --   --   --   --   --     Recent Labs  07/13/16 1420 07/24/16 08/13/16  AST 25 15 17   ALT 16 13 16   ALKPHOS 110 92 108  BILITOT 0.8  --   --   PROT 6.8  --   --   ALBUMIN 4.2  --   --     Recent Labs  07/13/16 1420  07/14/16 0638 07/15/16 0409 07/24/16 08/13/16  WBC 8.2  --  5.8 6.5 5.3 6.8  NEUTROABS 5.7  --   --   --   --   --   HGB 14.4  --  13.2 13.3 13.1 13.5  HCT 43.9  < > 41.3 41.3 39 41  MCV 94.2  --  97.9 95.6  --   --   PLT 213  --  210 191 222 264  < > = values in this interval not displayed. Lab Results  Component Value Date   TSH 1.57 12/25/2015   Lab Results  Component Value Date   HGBA1C 5.5 08/02/2015   Lab Results  Component Value Date   CHOL 193 12/25/2015   HDL 45 12/25/2015   LDLCALC 112 12/25/2015   TRIG 182 (A) 12/25/2015    Significant Diagnostic Results in last 30 days:  EKG 08/13/16 showed sinus rhythm, HR 66 bpm, no significant ST-T changes. 08/13/16 CXR mild stable cardiomegaly without overt congestive heart failure, no acute parenchymal pathology evident.  Assessment/Plan Psychosis persisted visual, tactile, olfactory hallucination, delusions, different smells, crying spells, choking herself, thinking someone else trying to kill her, fear of being attacked, stated they are going to come and get me, slept in the sofa of living room instead of her room. Not responding to Seroquel well.  08/21/16 will dc Seroquel, start Risperdal 0.5mg  bid, 0.25mg  q6h prn per  Psycho consultation. Observe the patient.   Major depressive  disorder, recurrent ([place]) Persisted psychosis, will change to Risperdal/Seroqeul, continue Sertraline 100mg , Mirtazapine 15mg    Skin candidiasis Satellite pattern rash seen under the left breast skin fold, continue Nystatin, assist the patient with personal hygiene, may consider Diflucan po if no better.      Family/ staff Communication: plan of care reviewed with the patient and charge nurse  Labs/tests ordered: none  Time spend 45 minutes

## 2016-08-21 NOTE — Assessment & Plan Note (Signed)
persisted visual, tactile, olfactory hallucination, delusions, different smells, crying spells, choking herself, thinking someone else trying to kill her, fear of being attacked, stated they are going to come and get me, slept in the sofa of living room instead of her room. Not responding to Seroquel well.  08/21/16 will dc Seroquel, start Risperdal 0.5mg  bid, 0.25mg  q6h prn per Psycho consultation. Observe the patient.

## 2016-09-05 ENCOUNTER — Non-Acute Institutional Stay (SKILLED_NURSING_FACILITY): Payer: Medicare Other

## 2016-09-05 DIAGNOSIS — Z0000 Encounter for general adult medical examination without abnormal findings: Secondary | ICD-10-CM

## 2016-09-05 NOTE — Progress Notes (Signed)
Subjective:   Erica [name] is a 83 y.o. female who presents for Medicare Annual (Subsequent) preventive examination at [place]; incapacitated patient unable to answer questions appropriately   Last AWV-09/24/11       Objective:     Vitals: BP (!) 112/55 (BP Location: Right Arm, Patient Position: Sitting)   Pulse 74   Temp 98.4 F (36.9 C) (Oral)   Ht 5\' 3"  (1.6 m)   Wt 152 lb (68.9 kg)   SpO2 94%   BMI 26.93 kg/m   Body mass index is 26.93 kg/m.   Tobacco History  Smoking Status  . Never Smoker  Smokeless Tobacco  . Never Used     Counseling given: Not Answered   Past Medical History:  Diagnosis Date  . Acute encephalopathy   . Aphasia following unspecified cerebrovascular disease   . 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 Depression   . Dysphagia following cerebrovascular disease   . Hepatic steatosis   . Hydroureteronephrosis   . Hypertension   . Insomnia   . Macular degeneration   . Major depressive disorder, recurrent ([place])   . Neuralgia and neuritis, unspecified (CODE)   . Nontraumatic intracranial hemorrhage ([place])   . Stroke ([place])   . 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   . 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 ABDOMINAL HYSTERECTOMY    . APPENDECTOMY    . BREAST SURGERY    . MASTECTOMY Right    History reviewed. No pertinent family history. History  Sexual Activity  . Sexual activity: No    Outpatient Encounter Prescriptions as of 09/05/2016  Medication Sig  . acetaminophen (TYLENOL) 325 MG tablet Take 650 mg by mouth every 4 (four) hours as needed for pain.  Marland Kitchen atorvastatin (LIPITOR) 10 MG tablet Take 10 mg by mouth daily.  . bisacodyl (DULCOLAX) 10 MG suppository Place 1 suppository (10 mg total) rectally daily as needed for moderate constipation.  . Calcium Carbonate-Vitamin D  (CALTRATE 600+D) 600-400 MG-UNIT tablet Take 1 tablet by mouth 2 (two) times daily.  . feeding supplement, ENSURE ENLIVE, (ENSURE ENLIVE) LIQD Take 237 mLs by mouth daily at 2 PM.  . fluticasone (FLONASE) 50 MCG/ACT nasal spray Place 1 spray into both nostrils as needed for allergies.   . hydrocortisone 2.5 % cream Apply 1 application topically 2 (two) times daily as needed. Apply to affected area on face  . metoprolol tartrate (LOPRESSOR) 25 MG tablet Take 25 mg by mouth 2 (two) times daily.  . mirtazapine (REMERON) 15 MG tablet Take 1 tablet (15 mg total) by mouth at bedtime.  . Multiple Vitamins-Minerals (I-VITE) TABS Take 1 tablet by mouth daily.  Marland Kitchen nystatin (NYSTATIN) powder Apply topically daily as needed. Under left breast   . omeprazole (PRILOSEC) 20 MG capsule Take 20 mg by mouth daily.  . polyethylene glycol (MIRALAX / GLYCOLAX) packet Take 17 g by mouth daily.  . risperiDONE (RISPERDAL) 0.5 MG tablet Take 0.5 mg by mouth every 6 (six) hours as needed. For psychosis  . Rivaroxaban (XARELTO) 15 MG TABS tablet Take 1 tablet (15 mg total) by mouth daily at 6 PM.  . sertraline (ZOLOFT) 100 MG tablet Take 1 tablet (100 mg total) by mouth daily at 6 PM.  . Simethicone (GAS RELIEF 80 PO) Take 80 mg by mouth 4 (four) times  daily - after meals and at bedtime. Take one tablet after meals and at bedtime for gas relief   . vitamin B-12 (CYANOCOBALAMIN) 1000 MCG tablet Take 1 tablet (1,000 mcg total) by mouth daily.  . [DISCONTINUED] QUEtiapine (SEROQUEL) 50 MG tablet Take 50 mg by mouth 2 (two) times daily.   No facility-administered encounter medications on file as of 09/05/2016.     Activities of Daily Living In your present state of health, do you have any difficulty performing the following activities: 09/05/2016 07/13/2016  Hearing? N Y  Vision? Y Y  Comment - glasses with patients daughter [name]- will bring it tomorrow  Difficulty concentrating or making decisions? Tempie Donning  Walking or climbing  stairs? Y Y  Dressing or bathing? Y Y  Doing errands, shopping? Tempie Donning  Preparing Food and eating ? Y -  Using the Toilet? Y -  In the past six months, have you accidently leaked urine? Y -  Do you have problems with loss of bowel control? Y -  Managing your Medications? Y -  Managing your Finances? Y -  Housekeeping or managing your Housekeeping? Y -  Some recent data might be hidden    Patient Care Team: Blanchie Serve, MD as PCP - General (Internal Medicine) Mast, Man X, NP as Nurse Practitioner (Internal Medicine) Juluis Rainier as Consulting Physician (Optometry) Monna Fam, MD as Consulting Physician (Ophthalmology)    Assessment:     Exercise Activities and Dietary recommendations Current Exercise Habits: The patient does not participate in regular exercise at present, Exercise limited by: neurologic condition(s)  Goals    None     Fall Risk Fall Risk  09/05/2016  Falls in the past year? No   Depression Screen PHQ 2/9 Scores 09/05/2016  PHQ - 2 Score 0     Cognitive Function MMSE - Mini Mental State Exam 09/05/2016  Not completed: Unable to complete        Immunization History  Administered Date(s) Administered  . Influenza-Unspecified 11/20/2013, 09/26/2014, 10/24/2015   Screening Tests Health Maintenance  Topic Date Due  . TETANUS/TDAP  07/17/1946  . DEXA SCAN  07/18/1994  . PNA vac Low Risk Adult (1 of 2 - PCV13) 07/18/1994  . INFLUENZA VACCINE  08/06/2016      Plan:    I have personally reviewed and addressed the Medicare Annual Wellness questionnaire and have noted the following in the patient's chart:  A. Medical and social history B. Use of alcohol, tobacco or illicit drugs  C. Current medications and supplements D. Functional ability and status E.  Nutritional status F.  Physical activity G. Advance directives H. List of other physicians I.  Hospitalizations, surgeries, and ER visits in previous 12 months J.  [place] to  include hearing, vision, cognitive, depression L. Referrals and appointments - none  In addition, I am unable to review and discuss with incapacitated patient certain preventive protocols, quality metrics, and best practice recommendations. A written personalized care plan for preventive services as well as general preventive health recommendations were provided to patient.   See attached scanned questionnaire for additional information.   Signed,   Rich Reining, RN Nurse Health Advisor   Quick Notes   Health Maintenance: DEXA, prevnar, tdap due. DEXA not ordered due to dementia     Abnormal Screen: Unable to complete mental exam     Patient Concerns: none     Nurse Concerns: none

## 2016-09-05 NOTE — Patient Instructions (Signed)
Erica [name] , Thank you for taking time to come for your Medicare Wellness Visit. I appreciate your ongoing commitment to your health goals. Please review the following plan we discussed and let me know if I can assist you in the future.   Screening recommendations/referrals: Colonoscopy excluded, pt over age 81 Mammogram excluded, pt over age 39 Bone Density due, not ordered due to dementia Recommended yearly ophthalmology/optometry visit for glaucoma screening and checkup Recommended yearly dental visit for hygiene and checkup  Vaccinations: Influenza vaccine due Pneumococcal vaccine 13 due Tdap vaccine due Shingles vaccine not in records   Advanced directives: DNR and Health care power of attorney in chart  Conditions/risks identified: None  Next appointment: Dr. Bubba Camp makes rounds   Preventive Care 70 Years and Older, Female Preventive care refers to lifestyle choices and visits with your health care provider that can promote health and wellness. What does preventive care include?  A yearly physical exam. This is also called an annual well check.  Dental exams once or twice a year.  Routine eye exams. Ask your health care provider how often you should have your eyes checked.  Personal lifestyle choices, including:  Daily care of your teeth and gums.  Regular physical activity.  Eating a healthy diet.  Avoiding tobacco and drug use.  Limiting alcohol use.  Practicing safe sex.  Taking low-dose aspirin every day.  Taking vitamin and mineral supplements as recommended by your health care provider. What happens during an annual well check? The services and screenings done by your health care provider during your annual well check will depend on your age, overall health, lifestyle risk factors, and family history of disease. Counseling  Your health care provider may ask you questions about your:  Alcohol use.  Tobacco use.  Drug use.  Emotional  well-being.  Home and relationship well-being.  Sexual activity.  Eating habits.  History of falls.  Memory and ability to understand (cognition).  Work and work Statistician.  Reproductive health. Screening  You may have the following tests or measurements:  Height, weight, and BMI.  Blood pressure.  Lipid and cholesterol levels. These may be checked every 5 years, or more frequently if you are over 1 years old.  Skin check.  Lung cancer screening. You may have this screening every year starting at age 5 if you have a 30-pack-year history of smoking and currently smoke or have quit within the past 15 years.  Fecal occult blood test (FOBT) of the stool. You may have this test every year starting at age 40.  Flexible sigmoidoscopy or colonoscopy. You may have a sigmoidoscopy every 5 years or a colonoscopy every 10 years starting at age 63.  Hepatitis C blood test.  Hepatitis B blood test.  Sexually transmitted disease (STD) testing.  Diabetes screening. This is done by checking your blood sugar (glucose) after you have not eaten for a while (fasting). You may have this done every 1-3 years.  Bone density scan. This is done to screen for osteoporosis. You may have this done starting at age 58.  Mammogram. This may be done every 1-2 years. Talk to your health care provider about how often you should have regular mammograms. Talk with your health care provider about your test results, treatment options, and if necessary, the need for more tests. Vaccines  Your health care provider may recommend certain vaccines, such as:  Influenza vaccine. This is recommended every year.  Tetanus, diphtheria, and acellular pertussis (Tdap, Td) vaccine.  You may need a Td booster every 10 years.  Zoster vaccine. You may need this after age 22.  Pneumococcal 13-valent conjugate (PCV13) vaccine. One dose is recommended after age 76.  Pneumococcal polysaccharide (PPSV23) vaccine. One  dose is recommended after age 69. Talk to your health care provider about which screenings and vaccines you need and how often you need them. This information is not intended to replace advice given to you by your health care provider. Make sure you discuss any questions you have with your health care provider. Document Released: 01/19/2015 Document Revised: 09/12/2015 Document Reviewed: 10/24/2014 Elsevier Interactive Patient Education  2017 [place] Prevention in the Home Falls can cause injuries. They can happen to people of all ages. There are many things you can do to make your home safe and to help prevent falls. What can I do on the outside of my home?  Regularly fix the edges of walkways and driveways and fix any cracks.  Remove anything that might make you trip as you walk through a door, such as a raised step or threshold.  Trim any bushes or trees on the path to your home.  Use bright outdoor lighting.  Clear any walking paths of anything that might make someone trip, such as rocks or tools.  Regularly check to see if handrails are loose or broken. Make sure that both sides of any steps have handrails.  Any raised decks and porches should have guardrails on the edges.  Have any leaves, snow, or ice cleared regularly.  Use sand or salt on walking paths during winter.  Clean up any spills in your garage right away. This includes oil or grease spills. What can I do in the bathroom?  Use night lights.  Install grab bars by the toilet and in the tub and shower. Do not use towel bars as grab bars.  Use non-skid mats or decals in the tub or shower.  If you need to sit down in the shower, use a plastic, non-slip stool.  Keep the floor dry. Clean up any water that spills on the floor as soon as it happens.  Remove soap buildup in the tub or shower regularly.  Attach bath mats securely with double-sided non-slip rug tape.  Do not have throw rugs and other  things on the floor that can make you trip. What can I do in the bedroom?  Use night lights.  Make sure that you have a light by your bed that is easy to reach.  Do not use any sheets or blankets that are too big for your bed. They should not hang down onto the floor.  Have a firm chair that has side arms. You can use this for support while you get dressed.  Do not have throw rugs and other things on the floor that can make you trip. What can I do in the kitchen?  Clean up any spills right away.  Avoid walking on wet floors.  Keep items that you use a lot in easy-to-reach places.  If you need to reach something above you, use a strong step stool that has a grab bar.  Keep electrical cords out of the way.  Do not use floor polish or wax that makes floors slippery. If you must use wax, use non-skid floor wax.  Do not have throw rugs and other things on the floor that can make you trip. What can I do with my stairs?  Do not leave any  items on the stairs.  Make sure that there are handrails on both sides of the stairs and use them. Fix handrails that are broken or loose. Make sure that handrails are as long as the stairways.  Check any carpeting to make sure that it is firmly attached to the stairs. Fix any carpet that is loose or worn.  Avoid having throw rugs at the top or bottom of the stairs. If you do have throw rugs, attach them to the floor with carpet tape.  Make sure that you have a light switch at the top of the stairs and the bottom of the stairs. If you do not have them, ask someone to add them for you. What else can I do to help prevent falls?  Wear shoes that:  Do not have high heels.  Have rubber bottoms.  Are comfortable and fit you well.  Are closed at the toe. Do not wear sandals.  If you use a stepladder:  Make sure that it is fully opened. Do not climb a closed stepladder.  Make sure that both sides of the stepladder are locked into place.  Ask  someone to hold it for you, if possible.  Clearly mark and make sure that you can see:  Any grab bars or handrails.  First and last steps.  Where the edge of each step is.  Use tools that help you move around (mobility aids) if they are needed. These include:  Canes.  Walkers.  Scooters.  Crutches.  Turn on the lights when you go into a dark area. Replace any light bulbs as soon as they burn out.  Set up your furniture so you have a clear path. Avoid moving your furniture around.  If any of your floors are uneven, fix them.  If there are any pets around you, be aware of where they are.  Review your medicines with your doctor. Some medicines can make you feel dizzy. This can increase your chance of falling. Ask your doctor what other things that you can do to help prevent falls. This information is not intended to replace advice given to you by your health care provider. Make sure you discuss any questions you have with your health care provider. Document Released: 10/19/2008 Document Revised: 05/31/2015 Document Reviewed: 01/27/2014 Elsevier Interactive Patient Education  2017 [employer].

## 2016-09-12 ENCOUNTER — Non-Acute Institutional Stay (SKILLED_NURSING_FACILITY): Payer: Medicare Other | Admitting: Nurse Practitioner

## 2016-09-12 ENCOUNTER — Encounter: Payer: Self-pay | Admitting: Nurse Practitioner

## 2016-09-12 DIAGNOSIS — F29 Unspecified psychosis not due to a substance or known physiological condition: Secondary | ICD-10-CM

## 2016-09-12 DIAGNOSIS — F339 Major depressive disorder, recurrent, unspecified: Secondary | ICD-10-CM

## 2016-09-12 DIAGNOSIS — I10 Essential (primary) hypertension: Secondary | ICD-10-CM | POA: Diagnosis not present

## 2016-09-12 NOTE — Assessment & Plan Note (Signed)
Blood pressure is controlled, continue Metoprolol 25mg  bid po.

## 2016-09-12 NOTE — Assessment & Plan Note (Addendum)
Stabilized, no further visual hallucination, continue Risperdal 0.5mg  qd. Dc prn Risperdal 0.25mg  1/2 tab q6h prn-not used.

## 2016-09-12 NOTE — Assessment & Plan Note (Signed)
Her mood is stable, except occasionally crying when family visits, easily consoled. Continue Sertraline 100mg , Mirtazapine 15mg  po qd.

## 2016-09-12 NOTE — Progress Notes (Signed)
Location:  [facility] Room Number: [place]:  SNF (31) Provider: Sandy Blouch, [place]xie  NP  Blanchie Serve, MD  Patient Care Team: Blanchie Serve, MD as PCP - General (Internal Medicine) Taariq Leitz X, NP as Nurse Practitioner (Internal Medicine) Juluis Rainier as Consulting Physician (Optometry) Monna Fam, MD as Consulting Physician (Ophthalmology)  Extended Emergency Contact Information Primary Emergency Contact: [facility] Address: [address], [place] [address] [name] of [place] Phone: [phone] Work Phone: [phone] Mobile Phone: [phone] Relation: Daughter  Code Status:  DNR Goals of care: Advanced Directive information Advanced Directives 09/12/2016  Does Patient Have a Medical Advance Directive? Yes  Type of Advance Directive Out of facility DNR (pink MOST or yellow form);Healthcare Power of Attorney  Does patient want to make changes to medical advance directive? No - Patient declined  Copy of Jeff in Chart? Yes  Pre-existing out of facility DNR order (yellow form or pink MOST form) Yellow form placed in chart (order not valid for inpatient use);Pink MOST form placed in chart (order not valid for inpatient use)     Chief Complaint  Patient presents with  . Medical Management of Chronic Issues    HPI:  Pt is a 83 y.o. female seen today for medical management of chronic diseases.     Hx of dementia, psychosis, she sleeps in her room(fearful in the past), no further hallucination(seeing and conversation with people not present to others) since Risperdal 0.5mg  qd, prn Risperdal was not needed.  Staff reported one episode of crying and wanting to go home when her daughter visited 09/11/16, but she was easily consoled and redirected. She takes Sertraline 100mg , Mirtazapine 15mg  qd to stabilize her mood. Her blood pressure is controlled, taking Metoprolol 38m po bid.      Past Medical History:  Diagnosis Date  . Acute encephalopathy   . Aphasia following unspecified cerebrovascular disease   . 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 Depression   . Dysphagia following cerebrovascular disease   . Hepatic steatosis   . Hydroureteronephrosis   . Hypertension   . Insomnia   . Macular degeneration   . Major depressive disorder, recurrent ([place])   . Neuralgia and neuritis, unspecified ([place])   . Nontraumatic intracranial hemorrhage ([place])   . Stroke ([place])   . 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   . 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 ABDOMINAL HYSTERECTOMY    . APPENDECTOMY    . BREAST SURGERY    . MASTECTOMY Right     Allergies  Allergen Reactions  . Hydrocodone Nausea And Vomiting  . Morphine And Related Nausea And Vomiting  . Actonel [Risedronate Sodium] Other (See Comments)    unknown  . Darvon [Propoxyphene Hcl] Other (See Comments)    unknown  . Oxycodone Hcl Other (See Comments)    unknown  . Pneumovax [Pneumococcal Polysaccharide Vaccine] Other (See Comments)    unknown    Outpatient Encounter Prescriptions as of 09/12/2016  Medication Sig  . acetaminophen (TYLENOL) 325 MG tablet Take 650 mg by mouth every 4 (four) hours as needed for pain.  Marland Kitchen atorvastatin (LIPITOR) 10 MG tablet Take 10 mg by mouth daily.  . bisacodyl (DULCOLAX) 10 MG suppository Place 1 suppository (10 mg total)  rectally daily as needed for moderate constipation.  . Calcium Carbonate-Vitamin D (CALTRATE 600+D) 600-400 MG-UNIT tablet Take 1 tablet by mouth 2 (two) times daily.  . fluticasone (FLONASE) 50 MCG/ACT nasal spray Place 1 spray into both nostrils as needed for allergies.   . hydrocortisone 2.5 % cream Apply 1 application topically 2 (two) times daily as needed. Apply to affected area on face  . metoprolol tartrate (LOPRESSOR) 25  MG tablet Take 25 mg by mouth 2 (two) times daily.  . mirtazapine (REMERON) 15 MG tablet Take 1 tablet (15 mg total) by mouth at bedtime.  . Multiple Vitamins-Minerals (I-VITE) TABS Take 1 tablet by mouth daily.  Marland Kitchen nystatin (NYSTATIN) powder Apply topically daily as needed. Under left breast   . omeprazole (PRILOSEC) 20 MG capsule Take 20 mg by mouth daily.  . polyethylene glycol (MIRALAX / GLYCOLAX) packet Take 17 g by mouth daily.  . risperiDONE (RISPERDAL) 0.5 MG tablet Take 0.5 mg by mouth 2 (two) times daily. Give at 8 am and 8 pm.  . Rivaroxaban (XARELTO) 15 MG TABS tablet Take 1 tablet (15 mg total) by mouth daily at 6 PM.  . sertraline (ZOLOFT) 100 MG tablet Take 1 tablet (100 mg total) by mouth daily at 6 PM.  . Simethicone (GAS RELIEF 80 PO) Take 80 mg by mouth 4 (four) times daily - after meals and at bedtime. Take one tablet after meals and at bedtime for gas relief   . vitamin B-12 (CYANOCOBALAMIN) 1000 MCG tablet Take 1 tablet (1,000 mcg total) by mouth daily.  . [DISCONTINUED] feeding supplement, ENSURE ENLIVE, (ENSURE ENLIVE) LIQD Take 237 mLs by mouth daily at 2 PM.  . [DISCONTINUED] risperiDONE (RISPERDAL) 0.5 MG tablet Take 0.5 mg by mouth every 6 (six) hours as needed. For psychosis   No facility-administered encounter medications on file as of 09/12/2016.    ROS was provided with assistance of staff Review of Systems  Constitutional: Negative for activity change, appetite change, chills, diaphoresis, fatigue and fever.  HENT: Positive for hearing loss. Negative for congestion, trouble swallowing and voice change.   Eyes: Negative for visual disturbance.  Respiratory: Negative for cough, choking and chest tightness.   Cardiovascular: Negative for chest pain, palpitations and leg swelling.  Gastrointestinal: Negative for abdominal distention, abdominal pain, constipation, diarrhea, nausea and vomiting.  Genitourinary: Negative for difficulty urinating, dysuria and urgency.        Incontinent of urine at night occasionally, self toileting, adult depends.   Musculoskeletal: Positive for gait problem.       Ambulates with walker.   Skin: Negative for color change and rash.       S/p left mastectomy.   Neurological: Negative for tremors, speech difficulty and weakness.  Hematological: Does not bruise/bleed easily.  Psychiatric/Behavioral: Positive for confusion. Negative for agitation, behavioral problems, hallucinations and sleep disturbance.    Immunization History  Administered Date(s) Administered  . Influenza-Unspecified 11/20/2013, 09/26/2014, 10/24/2015   Pertinent  Health Maintenance Due  Topic Date Due  . DEXA SCAN  07/18/1994  . PNA vac Low Risk Adult (1 of 2 - PCV13) 07/18/1994  . INFLUENZA VACCINE  08/06/2016   Fall Risk  09/05/2016  Falls in the past year? No   Functional Status Survey:    Vitals:   09/12/16 1600  BP: 120/60  Pulse: 70  Resp: 18  Temp: 98.2 F (36.8 C)  Weight: 158 lb 9.6 oz (71.9 kg)  Height: 5\' 3"  (1.6 m)   Body mass index  is 28.09 kg/m. Physical Exam  Constitutional: She appears well-developed and well-nourished. No distress.  HENT:  Head: Normocephalic and atraumatic.  Eyes: Pupils are equal, round, and reactive to light. Conjunctivae and EOM are normal. No scleral icterus.  Neck: Normal range of motion. Neck supple. No thyromegaly present.  Cardiovascular: Normal rate, regular rhythm and normal heart sounds.   No murmur heard. Pulmonary/Chest: Effort normal and breath sounds normal. She has no wheezes. She has no rales.  Abdominal: Soft. Bowel sounds are normal. She exhibits no distension. There is no tenderness.  Musculoskeletal: Normal range of motion. She exhibits no edema.  Lymphadenopathy:    She has no cervical adenopathy.  Neurological: She is alert. No cranial nerve deficit. Coordination normal.  Oriented to self  Skin: Skin is warm and dry. No rash noted. She is not diaphoretic. No pallor.    Psychiatric: She has a normal mood and affect. Her behavior is normal. Thought content normal.    Labs reviewed:  Recent Labs  07/13/16 1420 07/14/16 0638 07/15/16 0409 07/17/16 0627 07/24/16 08/13/16  NA 140 142 138 139 142 137  K 3.5 3.7 3.3* 3.8 3.6 3.7  CL 109 111 107 108  --   --   CO2 23 23 23 26   --   --   GLUCOSE 113* 94 88 97  --   --   BUN 10 7 9 10 11 14   CREATININE 0.99 1.01* 1.04* 0.91 1.1 1.0  CALCIUM 9.2 8.8* 9.0 9.0  --   --   MG 2.4  --   --   --   --   --   PHOS 2.3*  --   --   --   --   --     Recent Labs  07/13/16 1420 07/24/16 08/13/16  AST 25 15 17   ALT 16 13 16   ALKPHOS 110 92 108  BILITOT 0.8  --   --   PROT 6.8  --   --   ALBUMIN 4.2  --   --     Recent Labs  07/13/16 1420  07/14/16 0638 07/15/16 0409 07/24/16 08/13/16  WBC 8.2  --  5.8 6.5 5.3 6.8  NEUTROABS 5.7  --   --   --   --   --   HGB 14.4  --  13.2 13.3 13.1 13.5  HCT 43.9  < > 41.3 41.3 39 41  MCV 94.2  --  97.9 95.6  --   --   PLT 213  --  210 191 222 264  < > = values in this interval not displayed. Lab Results  Component Value Date   TSH 1.57 12/25/2015   Lab Results  Component Value Date   HGBA1C 5.5 08/02/2015   Lab Results  Component Value Date   CHOL 193 12/25/2015   HDL 45 12/25/2015   LDLCALC 112 12/25/2015   TRIG 182 (A) 12/25/2015    Significant Diagnostic Results in last 30 days:  No results found.  Assessment/Plan Psychosis Stabilized, no further visual hallucination, continue Risperdal 0.5mg  qd. Dc prn Risperdal 0.25mg  1/2 tab q6h prn-not used.   Depression Her mood is stable, except occasionally crying when family visits, easily consoled. Continue Sertraline 100mg , Mirtazapine 15mg  po qd.   Essential hypertension, benign Blood pressure is controlled, continue Metoprolol 25mg  bid po.      Family/ staff Communication: plan of care reviewed with the patient and charge nuse.   Labs/tests ordered:  none  Time spend 25  minutes

## 2016-09-28 IMAGING — CT CT HEAD W/O CM
2 series · 16 of 30 positions shown, 20 images · non-contrast
Comparison: None.

CLINICAL DATA: Headache.  Evaluate for intracranial hemorrhage.

EXAM:
CT HEAD WITHOUT CONTRAST
TECHNIQUE: Contiguous axial images were obtained from the base of the skull
through the vertex without intravenous contrast.

[Series 201: head w/o, idose (1) · axial · non-contrast · 0.49mm/px · z∈[+33,+153]mm · 13 of 30 slices shown, 17 images]
[im 3/30  brain]
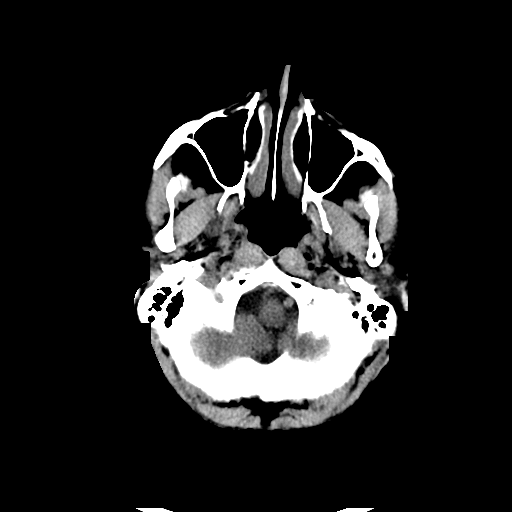
[im 3/30  bone]
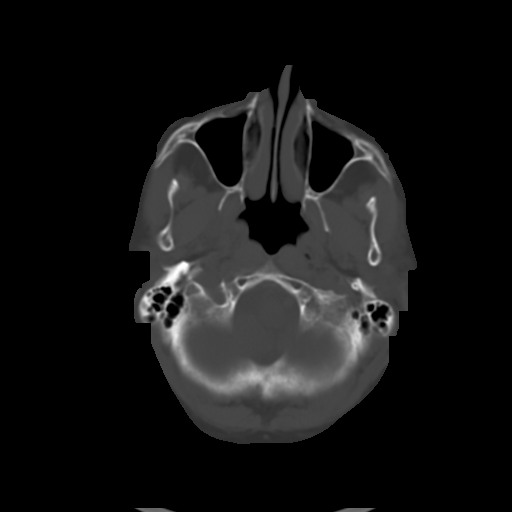
[im 5/30  brain]
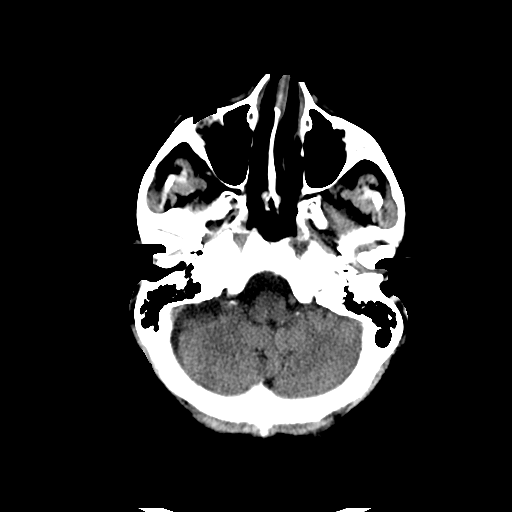
[im 7/30  brain]
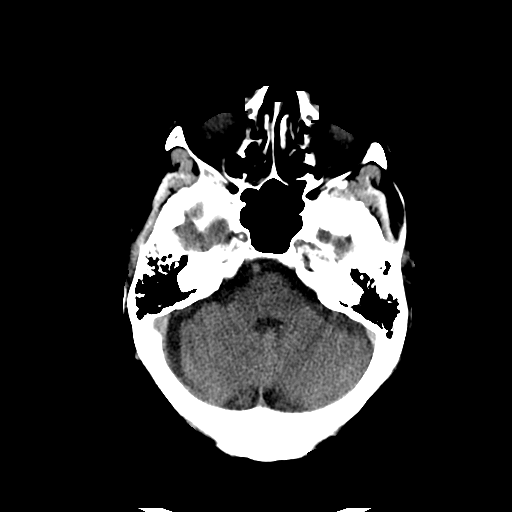
[im 9/30  brain]
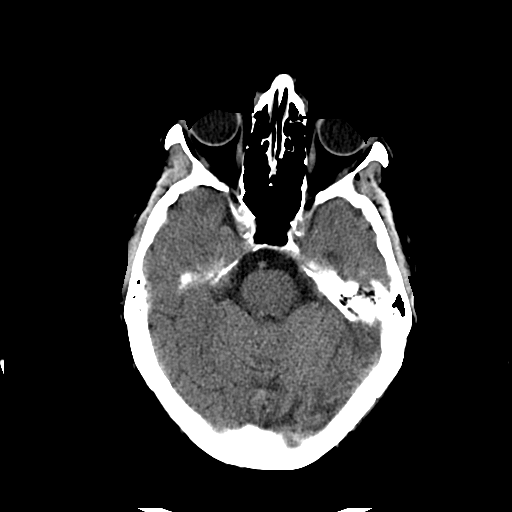
[im 11/30  brain]
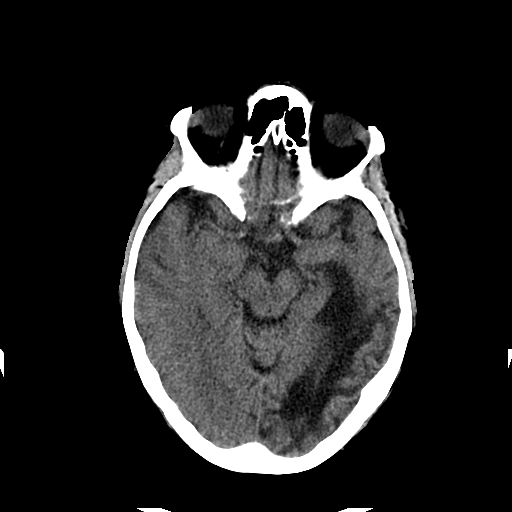
[im 11/30  bone]
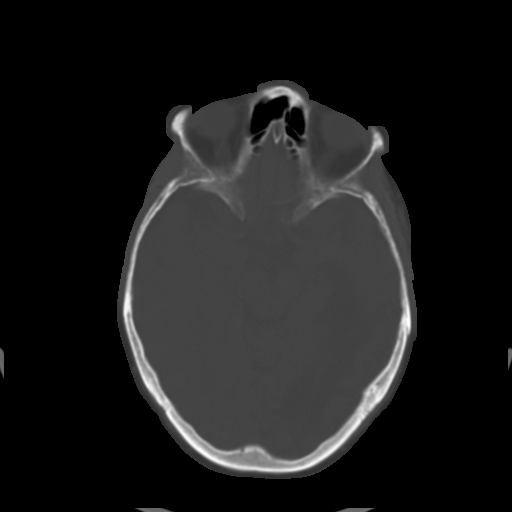
[im 13/30  brain]
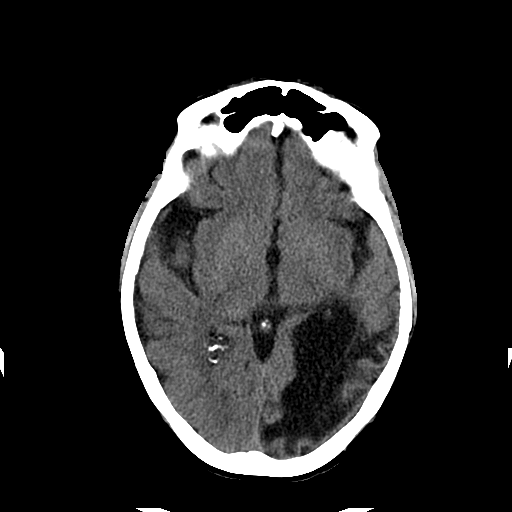
[im 15/30  brain]
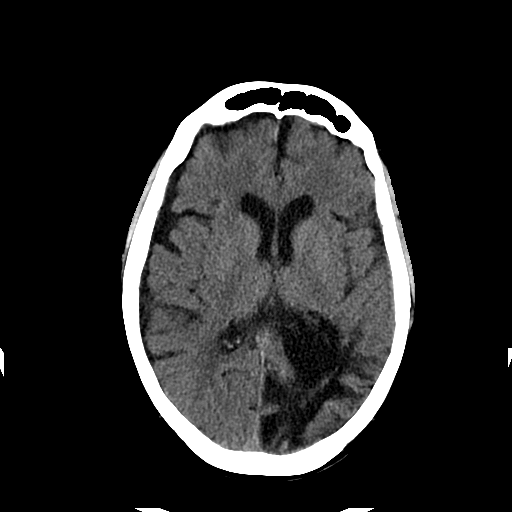
[im 17/30  brain]
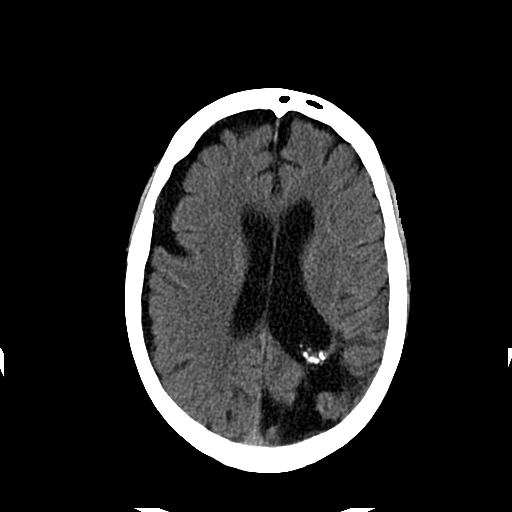
[im 19/30  brain]
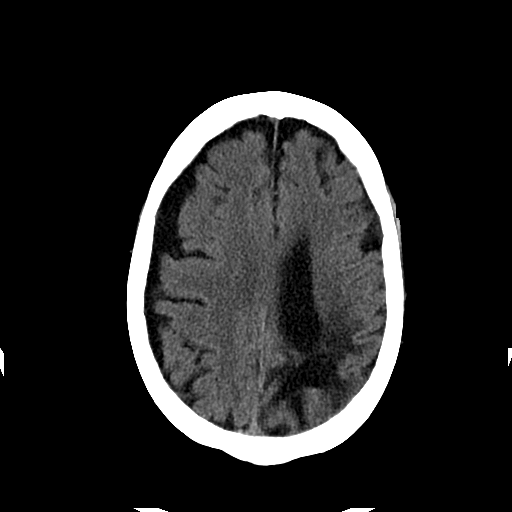
[im 19/30  bone]
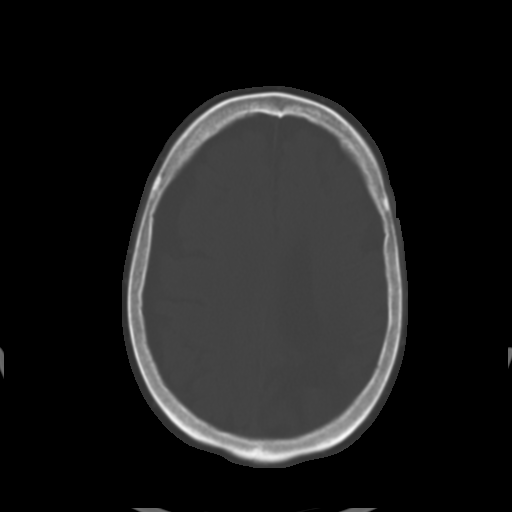
[im 21/30  brain]
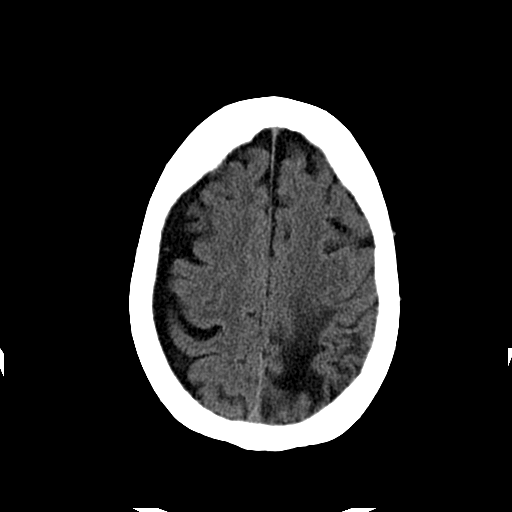
[im 23/30  brain]
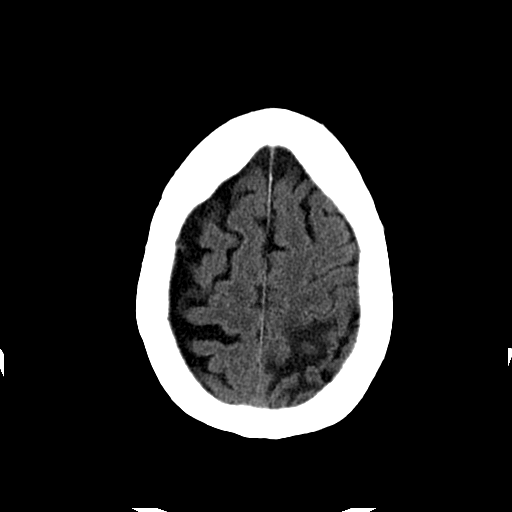
[im 25/30  brain]
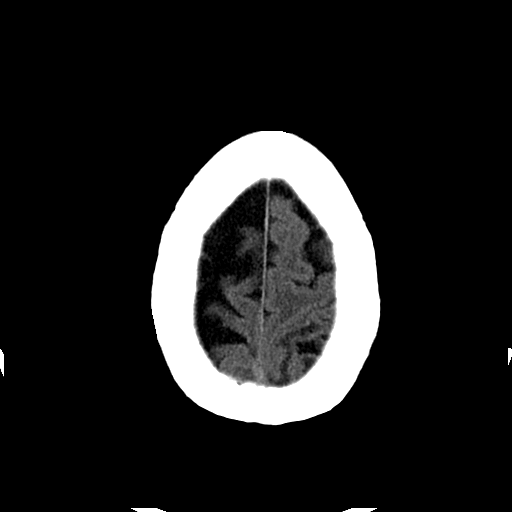
[im 27/30  brain]
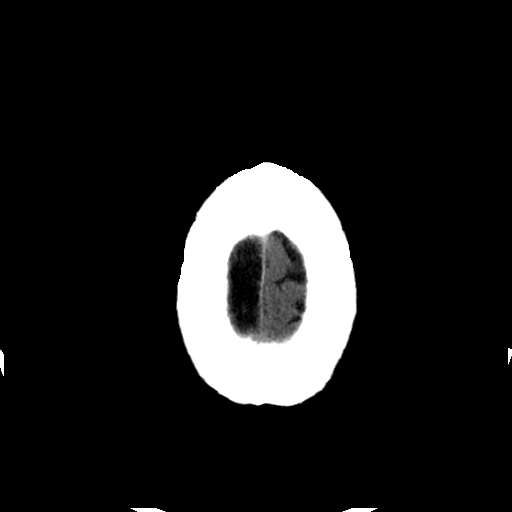
[im 27/30  bone]
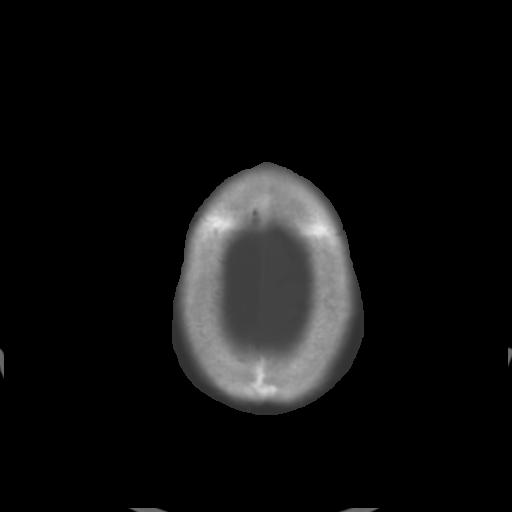

[Series 202: head w/o bone, idose (1) · axial · non-contrast · 0.49mm/px · z∈[+33,+73]mm · 3 of 30 slices shown]
[im 3/30  bone]
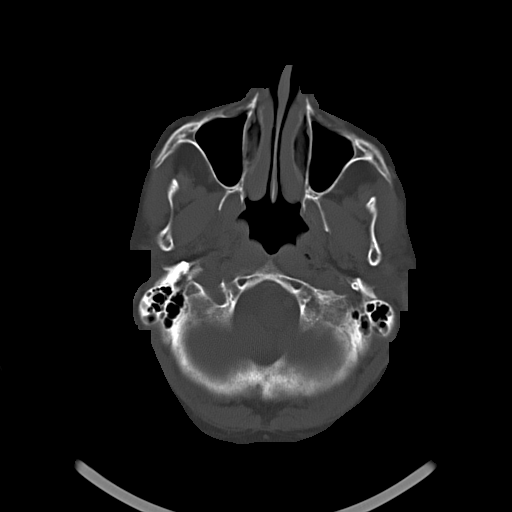
[im 7/30  bone]
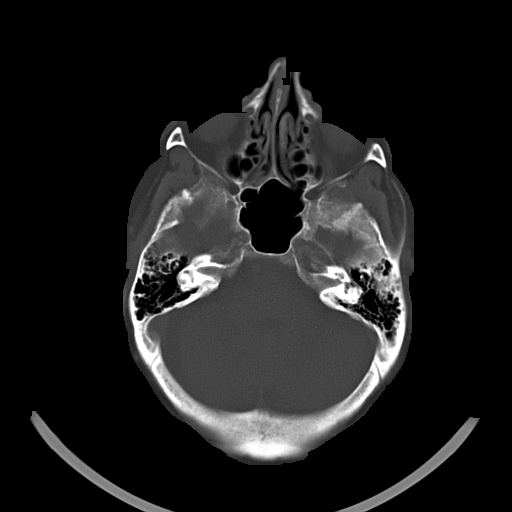
[im 11/30  bone]
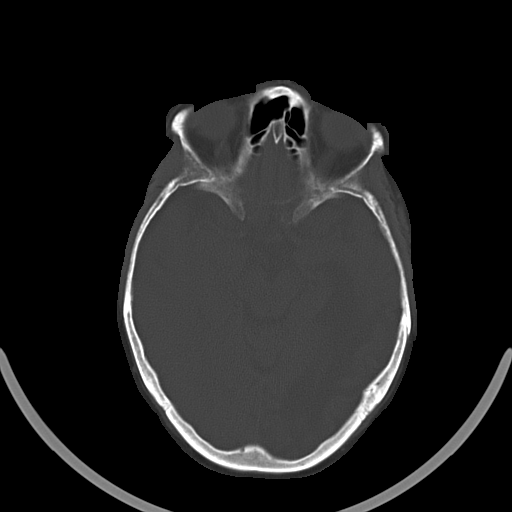

[16 of 30 positions shown; findings below may reference images not displayed]

FINDINGS: Skull and Sinuses:Negative for fracture or destructive process. The
visualized mastoids, middle ears, and imaged paranasal sinuses are
clear.

Visualized orbits: Bilateral cataract resection.

Brain: No evidence of acute infarction, hemorrhage, or
hydrocephalus.

There is gliosis and volume loss throughout most the left PCA
territory consistent with remote infarct.

CSF density expansion around the right cerebral convexity could
reflect a hygroma, but is indeterminate as there appears to be a
traversing vessel. Even if a hygroma, there is no significant mass
effect on the right cerebral cortex.
IMPRESSION: 1. No acute finding.
2. Remote left PCA territory infarct.

## 2016-10-10 ENCOUNTER — Non-Acute Institutional Stay (SKILLED_NURSING_FACILITY): Payer: Medicare Other | Admitting: Internal Medicine

## 2016-10-10 ENCOUNTER — Encounter: Payer: Self-pay | Admitting: Internal Medicine

## 2016-10-10 DIAGNOSIS — K219 Gastro-esophageal reflux disease without esophagitis: Secondary | ICD-10-CM

## 2016-10-10 DIAGNOSIS — K5909 Other constipation: Secondary | ICD-10-CM | POA: Diagnosis not present

## 2016-10-10 DIAGNOSIS — F339 Major depressive disorder, recurrent, unspecified: Secondary | ICD-10-CM

## 2016-10-10 DIAGNOSIS — Z8673 Personal history of transient ischemic attack (TIA), and cerebral infarction without residual deficits: Secondary | ICD-10-CM

## 2016-10-10 NOTE — Progress Notes (Signed)
Location:  [place] Room Number: [place]:  SNF [phone]) Provider:  Blanchie Serve MD  Blanchie Serve, MD  Patient Care Team: Blanchie Serve, MD as PCP - General (Internal Medicine) Mast, Man X, NP as Nurse Practitioner (Internal Medicine) Juluis Rainier as Consulting Physician (Optometry) Monna Fam, MD as Consulting Physician (Ophthalmology)  Extended Emergency Contact Information Primary Emergency Contact: [facility] Address: [address], [place] [address] [name] of [place] Phone: [phone] Work Phone: [phone] Mobile Phone: [phone] Relation: Daughter  Code Status:  DNR Goals of care: Advanced Directive information Advanced Directives 10/10/2016  Does Patient Have a Medical Advance Directive? Yes  Type of Paramedic of [place];Living will;Out of facility DNR (pink MOST or yellow form)  Does patient want to make changes to medical advance directive? No - Patient declined  Copy of [place] in Chart? Yes  Pre-existing out of facility DNR order (yellow form or pink MOST form) Yellow form placed in chart (order not valid for inpatient use)     Chief Complaint  Patient presents with  . Medical Management of Chronic Issues    Routine Visit     HPI:  Pt is a 83 y.o. female seen today for medical management of chronic diseases.  gerd- stable, denies any reflux symptom. No history of gi bleed. Currently takes omeprazole 20 mg daily.   Depression- mood stable this visit. Has dementia. No acute behavior changes reported by nursing. Currently on remeron 15 mg daily and sertraline 100 mg daily.   Constipation- regular bowel movement, on daily miralax  History of CVA- controlled BP reading on review. Currently on atorvastatin, metoprolol tartrate and xarelto. No bleed reported. No fall reported.   Past Medical History:  Diagnosis Date  .  Acute encephalopathy   . Aphasia following unspecified cerebrovascular disease   . 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 Depression   . Dysphagia following cerebrovascular disease   . Hepatic steatosis   . Hydroureteronephrosis   . Hypertension   . Insomnia   . Macular degeneration   . Major depressive disorder, recurrent ([place])   . Neuralgia and neuritis, unspecified (CODE)   . Nontraumatic intracranial hemorrhage ([place])   . Stroke ([place])   . 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   . 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 ABDOMINAL HYSTERECTOMY    . APPENDECTOMY    . BREAST SURGERY    . MASTECTOMY Right     Allergies  Allergen Reactions  . Hydrocodone Nausea And Vomiting  . Morphine And Related Nausea And Vomiting  . Actonel [Risedronate Sodium] Other (See Comments)    unknown  . Darvon [Propoxyphene Hcl] Other (See Comments)    unknown  . Oxycodone Hcl Other (See Comments)    unknown  . Pneumovax [Pneumococcal Polysaccharide Vaccine] Other (See Comments)    unknown    Outpatient Encounter Prescriptions as of 10/10/2016  Medication Sig  . acetaminophen (TYLENOL) 325 MG tablet Take 650 mg by mouth every 4 (four) hours as needed for pain.  Marland Kitchen atorvastatin (LIPITOR) 10 MG tablet Take 10 mg by mouth daily.  . bisacodyl (DULCOLAX) 10 MG suppository Place 1 suppository (10 mg total) rectally daily as needed for moderate constipation.  . Calcium Carbonate-Vitamin  D (CALTRATE 600+D) 600-400 MG-UNIT tablet Take 1 tablet by mouth 2 (two) times daily.  . fluticasone (FLONASE) 50 MCG/ACT nasal spray Place 1 spray into both nostrils as needed for allergies.   . hydrocortisone 2.5 % cream Apply 1 application topically 2 (two) times daily as needed. Apply to affected area on face  . metoprolol tartrate (LOPRESSOR) 25 MG tablet Take 25 mg by mouth 2 (two) times  daily.  . mirtazapine (REMERON) 15 MG tablet Take 1 tablet (15 mg total) by mouth at bedtime.  . Multiple Vitamins-Minerals (I-VITE) TABS Take 1 tablet by mouth daily.  Marland Kitchen nystatin (NYSTATIN) powder Apply topically daily as needed. Under left breast   . omeprazole (PRILOSEC) 20 MG capsule Take 20 mg by mouth daily.  . polyethylene glycol (MIRALAX / GLYCOLAX) packet Take 17 g by mouth daily.  . risperiDONE (RISPERDAL) 0.5 MG tablet Take 0.5 mg by mouth 2 (two) times daily. Give at 8 am and 8 pm.  . Rivaroxaban (XARELTO) 15 MG TABS tablet Take 1 tablet (15 mg total) by mouth daily at 6 PM.  . sertraline (ZOLOFT) 100 MG tablet Take 1 tablet (100 mg total) by mouth daily at 6 PM.  . Simethicone (GAS RELIEF 80 PO) Take 80 mg by mouth 4 (four) times daily - after meals and at bedtime. Take one tablet after meals and at bedtime for gas relief   . vitamin B-12 (CYANOCOBALAMIN) 1000 MCG tablet Take 1 tablet (1,000 mcg total) by mouth daily.   No facility-administered encounter medications on file as of 10/10/2016.     Review of Systems  Constitutional: Negative for appetite change and chills.  HENT: Negative for congestion, mouth sores, rhinorrhea, sore throat and trouble swallowing.   Respiratory: Negative for cough and shortness of breath.   Cardiovascular: Positive for leg swelling. Negative for chest pain and palpitations.  Gastrointestinal: Positive for constipation. Negative for abdominal pain, blood in stool, diarrhea, nausea and vomiting.  Genitourinary: Negative for dysuria.  Musculoskeletal: Positive for gait problem.  Neurological: Negative for dizziness, light-headedness and headaches.  Hematological: Bruises/bleeds easily.  Psychiatric/Behavioral: Positive for confusion.    Immunization History  Administered Date(s) Administered  . Influenza-Unspecified 11/20/2013, 09/26/2014, 10/24/2015   Pertinent  Health Maintenance Due  Topic Date Due  . INFLUENZA VACCINE  10/15/2016  (Originally 08/06/2016)  . PNA vac Low Risk Adult (1 of 2 - PCV13) 11/06/2016 (Originally 07/18/1994)  . DEXA SCAN  12/06/2016 (Originally 07/18/1994)   Fall Risk  09/05/2016  Falls in the past year? No   Functional Status Survey:    Vitals:   10/10/16 1104  BP: 130/78  Pulse: 70  Resp: 18  Temp: 97.9 F (36.6 C)  TempSrc: Oral  Weight: 158 lb 9.6 oz (71.9 kg)  Height: 5\' 3"  (1.6 m)   Body mass index is 28.09 kg/m.   Wt Readings from Last 3 Encounters:  10/10/16 158 lb 9.6 oz (71.9 kg)  09/12/16 158 lb 9.6 oz (71.9 kg)  09/05/16 152 lb (68.9 kg)    Physical Exam  Constitutional: She appears well-developed and well-nourished. No distress.  HENT:  Head: Normocephalic and atraumatic.  Mouth/Throat: Oropharynx is clear and moist.  Eyes: Pupils are equal, round, and reactive to light. Conjunctivae and EOM are normal.  Neck: Normal range of motion. Neck supple. No thyromegaly present.  Cardiovascular: Normal rate and regular rhythm.   Pulmonary/Chest: Effort normal and breath sounds normal. No respiratory distress. She has no wheezes. She has no rales.  Abdominal:  Soft. Bowel sounds are normal. She exhibits no distension. There is no tenderness.  Musculoskeletal: She exhibits edema.  Ted hose present, can move all 4 extremities, on wheelchair. Uses walker.   Lymphadenopathy:    She has no cervical adenopathy.  Neurological: She is alert.  Oriented only to self  Skin: Skin is warm and dry. She is not diaphoretic.  Bruises easily  Psychiatric: She has a normal mood and affect.    Labs reviewed:  Recent Labs  07/13/16 1420 07/14/16 0638 07/15/16 0409 07/17/16 0627 07/24/16 08/13/16  NA 140 142 138 139 142 137  K 3.5 3.7 3.3* 3.8 3.6 3.7  CL 109 111 107 108  --   --   CO2 23 23 23 26   --   --   GLUCOSE 113* 94 88 97  --   --   BUN 10 7 9 10 11 14   CREATININE 0.99 1.01* 1.04* 0.91 1.1 1.0  CALCIUM 9.2 8.8* 9.0 9.0  --   --   MG 2.4  --   --   --   --   --   PHOS  2.3*  --   --   --   --   --     Recent Labs  07/13/16 1420 07/24/16 08/13/16  AST 25 15 17   ALT 16 13 16   ALKPHOS 110 92 108  BILITOT 0.8  --   --   PROT 6.8  --   --   ALBUMIN 4.2  --   --     Recent Labs  07/13/16 1420  07/14/16 0638 07/15/16 0409 07/24/16 08/13/16  WBC 8.2  --  5.8 6.5 5.3 6.8  NEUTROABS 5.7  --   --   --   --   --   HGB 14.4  --  13.2 13.3 13.1 13.5  HCT 43.9  < > 41.3 41.3 39 41  MCV 94.2  --  97.9 95.6  --   --   PLT 213  --  210 191 222 264  < > = values in this interval not displayed. Lab Results  Component Value Date   TSH 1.57 12/25/2015   Lab Results  Component Value Date   HGBA1C 5.5 08/02/2015   Lab Results  Component Value Date   CHOL 193 12/25/2015   HDL 45 12/25/2015   LDLCALC 112 12/25/2015   TRIG 182 (A) 12/25/2015    Significant Diagnostic Results in last 30 days:  No results found.  Assessment/Plan  gerd Stable. Currently takes omeprazole 20 mg daily. Decrease this to 10 mg daily and monitor  Depression mood stable this visit. No acute behavior changes reported by nursing. risperidal has been helpful. Currently on remeron 15 mg daily and sertraline 100 mg daily. Decrease remeron to 7.5 mg daily and monitor.   Constipation regular bowel movement, continue daily miralax  History of CVA Monitor BP periodically. Continue atorvastatin and check lipid panel. Continue xarelto for secondary stroke prophylaxis. Fall precautions.  Lipid Panel     Component Value Date/Time   CHOL 193 12/25/2015   TRIG 182 (A) 12/25/2015   HDL 45 12/25/2015   LDLCALC 112 12/25/2015    Family/ staff Communication:   Labs/tests ordered:     Blanchie Serve, MD Internal Medicine [facility] [place], [place] [address] Cell Phone (Monday-Friday 8 am - 5 pm): [phone] On Call: [phone] and follow prompts after 5 pm and on weekends Office Phone: [phone]  Office Fax:  [phone]

## 2016-10-17 DIAGNOSIS — Z8673 Personal history of transient ischemic attack (TIA), and cerebral infarction without residual deficits: Secondary | ICD-10-CM | POA: Insufficient documentation

## 2016-10-21 LAB — LIPID PANEL
Cholesterol: 174 (ref 0–200)
HDL: 45 (ref 35–70)
LDL CALC: 103
LDl/HDL Ratio: 3.9
Triglycerides: 158 (ref 40–160)

## 2016-10-28 ENCOUNTER — Other Ambulatory Visit: Payer: Self-pay | Admitting: *Deleted

## 2016-11-10 ENCOUNTER — Encounter: Payer: Self-pay | Admitting: Nurse Practitioner

## 2016-11-10 ENCOUNTER — Non-Acute Institutional Stay: Payer: Medicare Other | Admitting: Nurse Practitioner

## 2016-11-10 DIAGNOSIS — F0151 Vascular dementia with behavioral disturbance: Secondary | ICD-10-CM | POA: Diagnosis not present

## 2016-11-10 DIAGNOSIS — Z86718 Personal history of other venous thrombosis and embolism: Secondary | ICD-10-CM | POA: Diagnosis not present

## 2016-11-10 DIAGNOSIS — I10 Essential (primary) hypertension: Secondary | ICD-10-CM

## 2016-11-10 DIAGNOSIS — F01518 Vascular dementia, unspecified severity, with other behavioral disturbance: Secondary | ICD-10-CM

## 2016-11-10 DIAGNOSIS — K219 Gastro-esophageal reflux disease without esophagitis: Secondary | ICD-10-CM

## 2016-11-10 DIAGNOSIS — F29 Unspecified psychosis not due to a substance or known physiological condition: Secondary | ICD-10-CM

## 2016-11-10 NOTE — Assessment & Plan Note (Addendum)
Her blood pressure is controlled, continue Metoprolol 39m bid.

## 2016-11-10 NOTE — Assessment & Plan Note (Signed)
History of GERD is stable, reduced Omeprazole to 10mg  qd since 10/10/16.

## 2016-11-10 NOTE — Assessment & Plan Note (Addendum)
dementia, psychosis/hallucintion, depression, her mood is stable, she has no recent hallucination noted, ambulates with walker, resides in Memory Care Unit in SNF. GDR, decrease Risperdal 0.25mg /0.5mg  bid, continue Sertraline 100mg  qd, Mirtazapine 7.5mg  qd since 10/10/16 in attempt for GDR.

## 2016-11-10 NOTE — Assessment & Plan Note (Signed)
Hx of DVT, protein C/S deficiency, taking Xarelto 15mg  qd.

## 2016-11-10 NOTE — Progress Notes (Addendum)
Location:  [facility] Room Number: [place]:  SNF (31) Provider:  Neema Barreira, [place]  NP  Blanchie Serve, MD  Patient Care Team: Blanchie Serve, MD as PCP - General (Internal Medicine) Usman Millett X, NP as Nurse Practitioner (Internal Medicine) Juluis Rainier as Consulting Physician (Optometry) Monna Fam, MD as Consulting Physician (Ophthalmology)  Extended Emergency Contact Information Primary Emergency Contact: [facility] Address: [address], [place] [address] [name] of [place] Phone: [phone] Work Phone: [phone] Mobile Phone: [phone] Relation: Daughter  Code Status:  DNR Goals of care: Advanced Directive information Advanced Directives 11/10/2016  Does Patient Have a Medical Advance Directive? Yes  Type of Paramedic of [place];Living will;Out of facility DNR (pink MOST or yellow form)  Does patient want to make changes to medical advance directive? No - Patient declined  Copy of [place] in Chart? Yes  Pre-existing out of facility DNR order (yellow form or pink MOST form) Yellow form placed in chart (order not valid for inpatient use)     Chief Complaint  Patient presents with  . Medical Management of Chronic Issues    HPI:  Pt is a 83 y.o. female seen today for medical management of chronic diseases.     The patient has history of dementia, psychosis/hallucintion, depression, her mood is stable, she has no recent hallucination noted, ambulates with walker, resides in Memory Care Unit in SNF. She takes Risperdal 0.5mg  bid, Sertraline 100mg  qd, Mirtazapine 7.5mg  qd since 105/18 in attempt for GDR. Her GERD is stable, reduced Omeprazole to 10mg  qd since 10/10/16. Her blood pressure is controlled on Metoprolol 90m bid. Hx of DVT, protein C/S deficiency, taking Xarelto 15mg  qd.    Past Medical History:  Diagnosis Date  . Acute  encephalopathy   . Aphasia following unspecified cerebrovascular disease   . 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 Depression   . Dysphagia following cerebrovascular disease   . Hepatic steatosis   . Hydroureteronephrosis   . Hypertension   . Insomnia   . Macular degeneration   . Major depressive disorder, recurrent ([place])   . Neuralgia and neuritis, unspecified (CODE)   . Nontraumatic intracranial hemorrhage ([place])   . Stroke ([place])   . 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   . 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 ABDOMINAL HYSTERECTOMY    . APPENDECTOMY    . BREAST SURGERY    . MASTECTOMY Right     Allergies  Allergen Reactions  . Hydrocodone Nausea And Vomiting  . Morphine And Related Nausea And Vomiting  . Actonel [Risedronate Sodium] Other (See Comments)    unknown  . Darvon [Propoxyphene Hcl] Other (See Comments)    unknown  . Oxycodone Hcl Other (See Comments)    unknown  . Pneumovax [Pneumococcal Polysaccharide Vaccine] Other (See Comments)    unknown    Outpatient Encounter Medications as of 11/10/2016  Medication Sig  . acetaminophen (TYLENOL) 325 MG tablet Take 650 mg by mouth every 4 (four) hours as needed for pain.  Marland Kitchen atorvastatin (LIPITOR) 10 MG tablet Take 10 mg by mouth daily.  . bisacodyl (DULCOLAX) 10 MG suppository Place 1 suppository (10 mg total) rectally daily as needed for moderate constipation.  . Calcium Carbonate-Vitamin D (  CALTRATE 600+D) 600-400 MG-UNIT tablet Take 1 tablet by mouth 2 (two) times daily.  . fluticasone (FLONASE) 50 MCG/ACT nasal spray Place 1 spray into both nostrils as needed for allergies.   . hydrocortisone 2.5 % cream Apply 1 application topically 2 (two) times daily as needed. Apply to affected area on face  . metoprolol tartrate (LOPRESSOR) 25 MG tablet Take 25 mg by mouth 2 (two) times daily.  .  mirtazapine (REMERON) 15 MG tablet Take 1 tablet (15 mg total) by mouth at bedtime.  . Multiple Vitamins-Minerals (I-VITE) TABS Take 1 tablet by mouth daily.  Marland Kitchen nystatin (NYSTATIN) powder Apply topically daily as needed. Under left breast   . omeprazole (PRILOSEC) 20 MG capsule Take 20 mg by mouth daily.  . polyethylene glycol (MIRALAX / GLYCOLAX) packet Take 17 g by mouth daily.  . risperiDONE (RISPERDAL) 0.5 MG tablet Take 0.5 mg by mouth 2 (two) times daily. Give at 8 am and 8 pm.  . Rivaroxaban (XARELTO) 15 MG TABS tablet Take 1 tablet (15 mg total) by mouth daily at 6 PM.  . sertraline (ZOLOFT) 100 MG tablet Take 1 tablet (100 mg total) by mouth daily at 6 PM.  . Simethicone (GAS RELIEF 80 PO) Take 80 mg by mouth 4 (four) times daily - after meals and at bedtime. Take one tablet after meals and at bedtime for gas relief   . vitamin B-12 (CYANOCOBALAMIN) 1000 MCG tablet Take 1 tablet (1,000 mcg total) by mouth daily.   No facility-administered encounter medications on file as of 11/10/2016.    ROS was provided with assistance of staff Review of Systems  Constitutional: Negative for activity change, appetite change, chills, diaphoresis, fatigue and fever.  HENT: Positive for hearing loss. Negative for congestion, trouble swallowing and voice change.   Eyes: Negative for visual disturbance.  Respiratory: Negative for cough, choking, chest tightness, shortness of breath and wheezing.   Cardiovascular: Negative for chest pain, palpitations and leg swelling.  Gastrointestinal: Negative for abdominal distention, constipation, diarrhea, nausea and vomiting.  Endocrine: Negative for cold intolerance.  Genitourinary: Negative for difficulty urinating and dysuria.       Incontinent of urine.   Musculoskeletal: Positive for gait problem. Negative for arthralgias and back pain.       Ambulates with walker.   Skin: Negative for color change, pallor, rash and wound.  Neurological: Negative for  tremors and weakness.  Psychiatric/Behavioral: Positive for confusion. Negative for agitation, behavioral problems and sleep disturbance. The patient is not nervous/anxious.     Immunization History  Administered Date(s) Administered  . Influenza-Unspecified 11/20/2013, 09/26/2014, 10/24/2015   Pertinent  Health Maintenance Due  Topic Date Due  . PNA vac Low Risk Adult (1 of 2 - PCV13) 07/18/1994  . INFLUENZA VACCINE  08/06/2016  . DEXA SCAN  12/06/2016 (Originally 07/18/1994)   Fall Risk  09/05/2016  Falls in the past year? No   Functional Status Survey:    Vitals:   11/10/16 1334  BP: 138/60  Pulse: 60  Resp: 14  Temp: 98 F (36.7 C)  Weight: 161 lb 9.6 oz (73.3 kg)  Height: 5\' 3"  (1.6 m)   Body mass index is 28.63 kg/m. Physical Exam  Constitutional: She appears well-developed and well-nourished. No distress.  HENT:  Head: Normocephalic and atraumatic.  Eyes: Conjunctivae and EOM are normal. Pupils are equal, round, and reactive to light.  Neck: Normal range of motion. Neck supple. No JVD present. No thyromegaly present.  Cardiovascular: Normal rate, regular  rhythm and normal heart sounds.  No murmur heard. Pulmonary/Chest: Effort normal and breath sounds normal. She has no wheezes. She has no rales.  Abdominal: Soft. Bowel sounds are normal. She exhibits no distension. There is no tenderness.  Musculoskeletal: Normal range of motion. She exhibits no edema or tenderness.  Neurological: She is alert. No cranial nerve deficit. She exhibits normal muscle tone. Coordination normal.  Oriented to person  Skin: Skin is warm and dry. No rash noted. She is not diaphoretic.  Psychiatric: She has a normal mood and affect. Her behavior is normal.    Labs reviewed: Recent Labs    07/13/16 1420 07/14/16 0638 07/15/16 0409 07/17/16 0627 07/24/16 08/13/16  NA 140 142 138 139 142 137  K 3.5 3.7 3.3* 3.8 3.6 3.7  CL 109 111 107 108  --   --   CO2 23 23 23 26   --   --     GLUCOSE 113* 94 88 97  --   --   BUN 10 7 9 10 11 14   CREATININE 0.99 1.01* 1.04* 0.91 1.1 1.0  CALCIUM 9.2 8.8* 9.0 9.0  --   --   MG 2.4  --   --   --   --   --   PHOS 2.3*  --   --   --   --   --    Recent Labs    07/13/16 1420 07/24/16 08/13/16  AST 25 15 17   ALT 16 13 16   ALKPHOS 110 92 108  BILITOT 0.8  --   --   PROT 6.8  --   --   ALBUMIN 4.2  --   --    Recent Labs    07/13/16 1420  07/14/16 0638 07/15/16 0409 07/24/16 08/13/16  WBC 8.2  --  5.8 6.5 5.3 6.8  NEUTROABS 5.7  --   --   --   --   --   HGB 14.4  --  13.2 13.3 13.1 13.5  HCT 43.9   < > 41.3 41.3 39 41  MCV 94.2  --  97.9 95.6  --   --   PLT 213  --  210 191 222 264   < > = values in this interval not displayed.   Lab Results  Component Value Date   TSH 1.57 12/25/2015   Lab Results  Component Value Date   HGBA1C 5.5 08/02/2015   Lab Results  Component Value Date   CHOL 174 10/21/2016   HDL 45 10/21/2016   LDLCALC 103 10/21/2016   TRIG 158 10/21/2016    Significant Diagnostic Results in last 30 days:  No results found.  Assessment/Plan Vascular dementia with behavior disturbance dementia, psychosis/hallucintion, depression, her mood is stable, she has no recent hallucination noted, ambulates with walker, resides in Memory Care Unit in SNF. GDR, decrease Risperdal 0.25mg /0.5mg  bid, continue Sertraline 100mg  qd, Mirtazapine 7.5mg  qd since 10/10/16 in attempt for GDR.  GERD (gastroesophageal reflux disease) History of GERD is stable, reduced Omeprazole to 10mg  qd since 10/10/16.   Hypertension Her blood pressure is controlled, continue Metoprolol 31m bid.   History of DVT (deep vein thrombosis) Hx of DVT, protein C/S deficiency, taking Xarelto 15mg  qd.    Psychosis The patient is stable with her visual hallucination, will GDR reduce Risperdal to 0.25mg  po bid, observe the patient for returning of targeted behaviors. Obtain Hgb a1c to monitor for high risk medication.      Family/  staff Communication: plan of  care reviewed with the patient and charge nurse.   Labs/tests ordered:  hgb a1c  Time spend 25 minutes.

## 2016-12-02 LAB — HEMOGLOBIN A1C: Hemoglobin A1C: 5.5

## 2016-12-02 NOTE — Assessment & Plan Note (Signed)
The patient is stable with her visual hallucination, will GDR reduce Risperdal to 0.25mg  po bid, observe the patient for returning of targeted behaviors. Obtain Hgb a1c to monitor for high risk medication.

## 2016-12-03 ENCOUNTER — Other Ambulatory Visit: Payer: Self-pay | Admitting: *Deleted

## 2016-12-08 ENCOUNTER — Non-Acute Institutional Stay (SKILLED_NURSING_FACILITY): Payer: Medicare Other | Admitting: Nurse Practitioner

## 2016-12-08 ENCOUNTER — Encounter: Payer: Self-pay | Admitting: Nurse Practitioner

## 2016-12-08 DIAGNOSIS — W19XXXA Unspecified fall, initial encounter: Secondary | ICD-10-CM

## 2016-12-08 DIAGNOSIS — R269 Unspecified abnormalities of gait and mobility: Secondary | ICD-10-CM | POA: Diagnosis not present

## 2016-12-08 DIAGNOSIS — F0151 Vascular dementia with behavioral disturbance: Secondary | ICD-10-CM

## 2016-12-08 DIAGNOSIS — F01518 Vascular dementia, unspecified severity, with other behavioral disturbance: Secondary | ICD-10-CM

## 2016-12-08 NOTE — Assessment & Plan Note (Signed)
The patient has advanced dementia with poor safety awareness, she ambulates with walker, but she needs reminder to use it. Close supervision for safety.

## 2016-12-08 NOTE — Assessment & Plan Note (Addendum)
Ambulates with walker all the time, close supervision for safety. Risperdal was decreased 0.25mg  bid/0.5mg  bid since 12/02/16 for GDR(too soon to eval).

## 2016-12-08 NOTE — Assessment & Plan Note (Addendum)
reported fall, right knee pain, right knee laceration. She got up from the sofa and started walking where she lost her balance, the patient was found lying on her right side to her knee and right ankle. She was ambulating in her room without walker upon my entering, denied pain. X-ray 12/07/16 R knee/ankle showed no acute osseous, articular, or soft tissue abnormality. No right knee laceration noted upon my examination. Close supervision needed for safety

## 2016-12-08 NOTE — Progress Notes (Signed)
Location:   [place] Room Number: 109 Place of Service:  SNF (31) Provider: Lennie Odor Mast NP  Blanchie Serve, MD  Patient Care Team: Blanchie Serve, MD as PCP - General (Internal Medicine) Mast, Man X, NP as Nurse Practitioner (Internal Medicine) Juluis Rainier as Consulting Physician (Optometry) Monna Fam, MD as Consulting Physician (Ophthalmology)  Extended Emergency Contact Information Primary Emergency Contact: [facility] Address: [address], [place] [address] [name] Phone: [phone] Work Phone: [phone] Mobile Phone: [phone] Relation: Daughter  Code Status: DNR Goals of care: Advanced Directive information Advanced Directives 12/08/2016  Does Patient Have a Medical Advance Directive? Yes  Type of Paramedic of [place];Living will;Out of facility DNR (pink MOST or yellow form)  Does patient want to make changes to medical advance directive? No - Patient declined  Copy of [place] in Chart? Yes  Pre-existing out of facility DNR order (yellow form or pink MOST form) Yellow form placed in chart (order not valid for inpatient use)     Chief Complaint  Patient presents with  . Acute Visit    fell, hurt right knee    HPI:  Pt is a 83 y.o. female seen today for an acute visit for reported fall, right knee pain, right knee laceration. She got up from the sofa and started walking where she lost her balance, the patient was found lying on her right side to her knee and right ankle. She was ambulating in her room without walker upon my entering, denied pain. X-ray 12/07/16 R knee/ankle showed no acute osseous, articular, or soft tissue abnormality. The patient has advanced dementia with poor safety awareness, she ambulates with walker, but she needs reminder to use it.    Past Medical History:  Diagnosis Date  . Acute encephalopathy   . Aphasia following  unspecified cerebrovascular disease   . 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 Depression   . Dysphagia following cerebrovascular disease   . Hepatic steatosis   . Hydroureteronephrosis   . Hypertension   . Insomnia   . Macular degeneration   . Major depressive disorder, recurrent ([place])   . Neuralgia and neuritis, unspecified (CODE)   . Nontraumatic intracranial hemorrhage ([place])   . Stroke ([place])   . 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   . 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 ABDOMINAL HYSTERECTOMY    . APPENDECTOMY    . BREAST SURGERY    . MASTECTOMY Right     Allergies  Allergen Reactions  . Hydrocodone Nausea And Vomiting  . Morphine And Related Nausea And Vomiting  . Actonel [Risedronate Sodium] Other (See Comments)    unknown  . Darvon [Propoxyphene Hcl] Other (See Comments)    unknown  . Oxycodone Hcl Other (See Comments)    unknown  . Pneumovax [Pneumococcal Polysaccharide Vaccine] Other (See Comments)    unknown    Allergies as of 12/08/2016      Reactions   Hydrocodone Nausea And Vomiting   Morphine And Related Nausea And Vomiting   Actonel [risedronate Sodium] Other (See Comments)   unknown   Darvon [propoxyphene Hcl] Other (See Comments)   unknown   Oxycodone Hcl Other (See Comments)   unknown   Pneumovax [pneumococcal Polysaccharide Vaccine] Other (See  Comments)   unknown      Medication List        Accurate as of 12/08/16  2:59 PM. Always use your most recent med list.          acetaminophen 325 MG tablet Commonly known as:  TYLENOL Take 650 mg by mouth every 4 (four) hours as needed for pain.   atorvastatin 10 MG tablet Commonly known as:  LIPITOR Take 10 mg by mouth daily.   bisacodyl 10 MG suppository Commonly known as:  DULCOLAX Place 1 suppository (10 mg total) rectally daily as needed for moderate  constipation.   CALTRATE 600+D 600-400 MG-UNIT tablet Generic drug:  Calcium Carbonate-Vitamin D Take 1 tablet by mouth 2 (two) times daily.   fluticasone 50 MCG/ACT nasal spray Commonly known as:  FLONASE Place 1 spray into both nostrils as needed for allergies.   GAS RELIEF 80 PO Take 80 mg by mouth 4 (four) times daily - after meals and at bedtime. Take one tablet after meals and at bedtime for gas relief   hydrocortisone 2.5 % cream Apply 1 application topically 2 (two) times daily as needed. Apply to affected area on face   I-VITE Tabs Take 1 tablet by mouth daily.   metoprolol tartrate 25 MG tablet Commonly known as:  LOPRESSOR Take 25 mg by mouth 2 (two) times daily.   mirtazapine 15 MG tablet Commonly known as:  REMERON Take 1 tablet (15 mg total) by mouth at bedtime.   nystatin powder Generic drug:  nystatin Apply topically daily as needed. Under left breast   omeprazole 20 MG capsule Commonly known as:  PRILOSEC Take 20 mg by mouth daily.   polyethylene glycol packet Commonly known as:  MIRALAX / GLYCOLAX Take 17 g by mouth daily.   risperiDONE 0.5 MG tablet Commonly known as:  RISPERDAL Take 0.5 mg by mouth 2 (two) times daily. Give at 8 am and 8 pm.   Rivaroxaban 15 MG Tabs tablet Commonly known as:  XARELTO Take 1 tablet (15 mg total) by mouth daily at 6 PM.   sertraline 100 MG tablet Commonly known as:  ZOLOFT Take 1 tablet (100 mg total) by mouth daily at 6 PM.   vitamin B-12 1000 MCG tablet Commonly known as:  CYANOCOBALAMIN Take 1 tablet (1,000 mcg total) by mouth daily.      ROS was provided with assistance of staff Review of Systems  Constitutional: Negative for activity change, appetite change, chills, diaphoresis, fatigue and fever.  HENT: Positive for hearing loss. Negative for congestion, trouble swallowing and voice change.   Eyes: Negative for visual disturbance.  Respiratory: Negative for cough, choking, chest tightness,  shortness of breath and wheezing.   Cardiovascular: Negative for chest pain, palpitations and leg swelling.  Gastrointestinal: Negative for abdominal distention, abdominal pain, constipation, diarrhea, nausea and vomiting.  Endocrine: Negative for cold intolerance.  Genitourinary: Negative for difficulty urinating, dysuria and urgency.       Urinary leakage is managed with adult depends.   Musculoskeletal: Positive for gait problem. Negative for arthralgias, back pain, joint swelling and myalgias.       Fall  Skin: Negative for color change, pallor, rash and wound.  Neurological: Negative for tremors, speech difficulty, weakness and headaches.  Psychiatric/Behavioral: Positive for confusion. Negative for agitation, behavioral problems and sleep disturbance. The patient is not nervous/anxious.     Immunization History  Administered Date(s) Administered  . Influenza-Unspecified 11/20/2013, 09/26/2014, 10/24/2015   Pertinent  Health Maintenance Due  Topic Date Due  . DEXA SCAN  07/18/1994  . PNA vac Low Risk Adult (1 of 2 - PCV13) 07/18/1994  . INFLUENZA VACCINE  08/06/2016   Fall Risk  09/05/2016  Falls in the past year? No   Functional Status Survey:    Vitals:   12/08/16 1422  BP: 110/60  Pulse: 68  Resp: 18  Temp: 97.9 F (36.6 C)  SpO2: 95%  Weight: 160 lb 9.6 oz (72.8 kg)  Height: 5\' 3"  (1.6 m)   Body mass index is 28.45 kg/m. Physical Exam  Constitutional: She appears well-developed and well-nourished.  HENT:  Head: Normocephalic and atraumatic.  Eyes: Conjunctivae and EOM are normal. Pupils are equal, round, and reactive to light.  Neck: Normal range of motion. Neck supple. No JVD present. No thyromegaly present.  Cardiovascular: Normal rate, regular rhythm and normal heart sounds.  No murmur heard. Pulmonary/Chest: Effort normal and breath sounds normal. She has no wheezes. She has no rales.  Abdominal: Soft. Bowel sounds are normal. She exhibits no distension.  There is no rebound.  Musculoskeletal: Normal range of motion. She exhibits no edema or tenderness.  Neurological: She is alert. She exhibits normal muscle tone. Coordination normal.  Oriented to self.   Skin: Skin is warm and dry. No rash noted. No erythema. No pallor.  Psychiatric: She has a normal mood and affect. Her behavior is normal.    Labs reviewed: Recent Labs    07/13/16 1420 07/14/16 0638 07/15/16 0409 07/17/16 0627 07/24/16 08/13/16  NA 140 142 138 139 142 137  K 3.5 3.7 3.3* 3.8 3.6 3.7  CL 109 111 107 108  --   --   CO2 23 23 23 26   --   --   GLUCOSE 113* 94 88 97  --   --   BUN 10 7 9 10 11 14   CREATININE 0.99 1.01* 1.04* 0.91 1.1 1.0  CALCIUM 9.2 8.8* 9.0 9.0  --   --   MG 2.4  --   --   --   --   --   PHOS 2.3*  --   --   --   --   --    Recent Labs    07/13/16 1420 07/24/16 08/13/16  AST 25 15 17   ALT 16 13 16   ALKPHOS 110 92 108  BILITOT 0.8  --   --   PROT 6.8  --   --   ALBUMIN 4.2  --   --    Recent Labs    07/13/16 1420  07/14/16 0638 07/15/16 0409 07/24/16 08/13/16  WBC 8.2  --  5.8 6.5 5.3 6.8  NEUTROABS 5.7  --   --   --   --   --   HGB 14.4  --  13.2 13.3 13.1 13.5  HCT 43.9   < > 41.3 41.3 39 41  MCV 94.2  --  97.9 95.6  --   --   PLT 213  --  210 191 222 264   < > = values in this interval not displayed.   Lab Results  Component Value Date   TSH 1.57 12/25/2015   Lab Results  Component Value Date   HGBA1C 5.5 12/02/2016   Lab Results  Component Value Date   CHOL 174 10/21/2016   HDL 45 10/21/2016   LDLCALC 103 10/21/2016   TRIG 158 10/21/2016    Significant Diagnostic Results in last 30 days:  No results found.  Assessment/Plan: Fall  reported fall, right knee pain, right knee laceration. She got up from the sofa and started walking where she lost her balance, the patient was found lying on her right side to her knee and right ankle. She was ambulating in her room without walker upon my entering, denied pain. X-ray  12/07/16 R knee/ankle showed no acute osseous, articular, or soft tissue abnormality. No right knee laceration noted upon my examination. Close supervision needed for safety   Vascular dementia with behavior disturbance  The patient has advanced dementia with poor safety awareness, she ambulates with walker, but she needs reminder to use it. Close supervision for safety.    Gait abnormality Ambulates with walker all the time, close supervision for safety. Risperdal was decreased 0.25mg  bid/0.5mg  bid since 12/02/16 for GDR(too soon to eval).     Family/ staff Communication: plan of care reviewed with the patient and charge nurse  Labs/tests ordered: X-ray R knee/ankle done 12/07/16  Time spend 25 minutes.

## 2016-12-11 ENCOUNTER — Non-Acute Institutional Stay (SKILLED_NURSING_FACILITY): Payer: Medicare Other | Admitting: Internal Medicine

## 2016-12-11 ENCOUNTER — Encounter: Payer: Self-pay | Admitting: Internal Medicine

## 2016-12-11 DIAGNOSIS — I8291 Chronic embolism and thrombosis of unspecified vein: Secondary | ICD-10-CM | POA: Diagnosis not present

## 2016-12-11 DIAGNOSIS — I10 Essential (primary) hypertension: Secondary | ICD-10-CM | POA: Diagnosis not present

## 2016-12-11 DIAGNOSIS — R2681 Unsteadiness on feet: Secondary | ICD-10-CM | POA: Diagnosis not present

## 2016-12-11 DIAGNOSIS — F339 Major depressive disorder, recurrent, unspecified: Secondary | ICD-10-CM | POA: Diagnosis not present

## 2016-12-11 NOTE — Progress Notes (Signed)
Location:  [place] Room Number: [place]:  SNF [phone]) Provider:  Blanchie Serve MD  Blanchie Serve, MD  Patient Care Team: Blanchie Serve, MD as PCP - General (Internal Medicine) Mast, Man X, NP as Nurse Practitioner (Internal Medicine) Juluis Rainier as Consulting Physician (Optometry) Monna Fam, MD as Consulting Physician (Ophthalmology)  Extended Emergency Contact Information Primary Emergency Contact: [facility] Address: [address], [place] [address] [name] of [place] Phone: [phone] Work Phone: [phone] Mobile Phone: [phone] Relation: Daughter  Code Status:  DNR  Goals of care: Advanced Directive information Advanced Directives 12/11/2016  Does Patient Have a Medical Advance Directive? Yes  Type of Paramedic of [place];Living will;Out of facility DNR (pink MOST or yellow form)  Does patient want to make changes to medical advance directive? No - Patient declined  Copy of [place] in Chart? Yes  Pre-existing out of facility DNR order (yellow form or pink MOST form) Yellow form placed in chart (order not valid for inpatient use)     Chief Complaint  Patient presents with  . Medical Management of Chronic Issues    Routine Visit     HPI:  Pt is a 83 y.o. female seen today for medical management of chronic diseases. Her HPI and ROS are limited with her dementia. She says "no" to most of the questions asked. Appears calm this visit. Her risperidone dosing was adjusted with her ? Flat affect per psych nurse note in 12/02/16. She had a fall on 12/07/16 with loss of balance and scraped her right knee. Fractures were ruled out. She is now moving around with her walker. Mild anxiety noted by staff at times but redirection seems to be helpful.    Past Medical History:  Diagnosis Date  . Acute encephalopathy   . Aphasia following  unspecified cerebrovascular disease   . 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 Depression   . Dysphagia following cerebrovascular disease   . Hepatic steatosis   . Hydroureteronephrosis   . Hypertension   . Insomnia   . Macular degeneration   . Major depressive disorder, recurrent ([place])   . Neuralgia and neuritis, unspecified ([place])   . Nontraumatic intracranial hemorrhage ([place])   . Stroke ([place])   . 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   . 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 ABDOMINAL HYSTERECTOMY    . APPENDECTOMY    . BREAST SURGERY    . MASTECTOMY Right     Allergies  Allergen Reactions  . Hydrocodone Nausea And Vomiting  . Morphine And Related Nausea And Vomiting  . Actonel [Risedronate Sodium] Other (See Comments)    unknown  . Darvon [Propoxyphene Hcl] Other (See Comments)    unknown  . Oxycodone Hcl Other (See Comments)    unknown  . Pneumovax [Pneumococcal Polysaccharide Vaccine] Other (See Comments)    unknown    Outpatient Encounter Medications as of 12/11/2016  Medication Sig  . acetaminophen (TYLENOL) 325 MG tablet Take 650 mg by mouth every 4 (four) hours as needed for pain.  Marland Kitchen atorvastatin (LIPITOR) 10 MG tablet Take 10 mg by mouth daily.  . bisacodyl (DULCOLAX) 10 MG suppository Place 1 suppository (10 mg total) rectally daily as needed for moderate constipation.  Marland Kitchen  Calcium Carbonate-Vitamin D (CALTRATE 600+D) 600-400 MG-UNIT tablet Take 1 tablet by mouth 2 (two) times daily.  . fluticasone (FLONASE) 50 MCG/ACT nasal spray Place 1 spray into both nostrils as needed for allergies.   . hydrocortisone 2.5 % cream Apply 1 application topically 2 (two) times daily as needed. Apply to affected area on face  . metoprolol tartrate (LOPRESSOR) 25 MG tablet Take 25 mg by mouth 2 (two) times daily.  . mirtazapine (REMERON) 15 MG tablet Take  1 tablet (15 mg total) by mouth at bedtime.  . Multiple Vitamins-Minerals (I-VITE) TABS Take 1 tablet by mouth daily.  Marland Kitchen nystatin (NYSTATIN) powder Apply topically daily as needed. Under left breast   . omeprazole (PRILOSEC) 20 MG capsule Take 10 mg by mouth daily.   . polyethylene glycol (MIRALAX / GLYCOLAX) packet Take 17 g by mouth daily.  . risperiDONE (RISPERDAL) 0.25 MG tablet Take 0.25 mg by mouth 2 (two) times daily.  . Rivaroxaban (XARELTO) 15 MG TABS tablet Take 1 tablet (15 mg total) by mouth daily at 6 PM.  . sertraline (ZOLOFT) 100 MG tablet Take 1 tablet (100 mg total) by mouth daily at 6 PM.  . Simethicone (GAS RELIEF 80 PO) Take 80 mg by mouth 4 (four) times daily - after meals and at bedtime.   . vitamin B-12 (CYANOCOBALAMIN) 1000 MCG tablet Take 1 tablet (1,000 mcg total) by mouth daily.  . [DISCONTINUED] risperiDONE (RISPERDAL) 0.5 MG tablet Take 0.5 mg by mouth 2 (two) times daily. Give at 8 am and 8 pm.   No facility-administered encounter medications on file as of 12/11/2016.     Review of Systems  Unable to perform ROS: Dementia    Immunization History  Administered Date(s) Administered  . Influenza-Unspecified 11/20/2013, 09/26/2014, 10/24/2015   Pertinent  Health Maintenance Due  Topic Date Due  . DEXA SCAN  07/18/1994  . PNA vac Low Risk Adult (1 of 2 - PCV13) 07/18/1994  . INFLUENZA VACCINE  08/06/2016   Fall Risk  09/05/2016  Falls in the past year? No   Functional Status Survey:    Vitals:   12/11/16 1024  BP: 110/60  Pulse: 60  Resp: 20  Temp: 98.3 F (36.8 C)  TempSrc: Oral  SpO2: 96%  Weight: 160 lb 9.6 oz (72.8 kg)  Height: 5\' 3"  (1.6 m)   Body mass index is 28.45 kg/m.   Wt Readings from Last 3 Encounters:  12/11/16 160 lb 9.6 oz (72.8 kg)  12/08/16 160 lb 9.6 oz (72.8 kg)  11/10/16 161 lb 9.6 oz (73.3 kg)   Physical Exam  Constitutional: No distress.  overweight  HENT:  Head: Normocephalic and atraumatic.  Mouth/Throat:  Oropharynx is clear and moist.  Corrective glasses present  Eyes: Conjunctivae are normal. Pupils are equal, round, and reactive to light.  Neck: Neck supple.  Cardiovascular: Normal rate and regular rhythm.  Pulmonary/Chest: Effort normal. No respiratory distress. She has no wheezes. She has no rales.  Poor air movement  Abdominal: Soft. Bowel sounds are normal. There is no tenderness. There is no guarding.  Musculoskeletal: She exhibits edema and deformity.  Ted hose to both legs. Unsteady gait. Uses walker with brake. Can move all 4 extremities.  Lymphadenopathy:    She has no cervical adenopathy.  Neurological: She is alert.  Oriented to self only  Skin: Skin is warm and dry. She is not diaphoretic.  Psychiatric:  Minimal interaction    Labs reviewed: Recent Labs    07/13/16  1420 07/14/16 0638 07/15/16 0409 07/17/16 0627 07/24/16 08/13/16  NA 140 142 138 139 142 137  K 3.5 3.7 3.3* 3.8 3.6 3.7  CL 109 111 107 108  --   --   CO2 23 23 23 26   --   --   GLUCOSE 113* 94 88 97  --   --   BUN 10 7 9 10 11 14   CREATININE 0.99 1.01* 1.04* 0.91 1.1 1.0  CALCIUM 9.2 8.8* 9.0 9.0  --   --   MG 2.4  --   --   --   --   --   PHOS 2.3*  --   --   --   --   --    Recent Labs    07/13/16 1420 07/24/16 08/13/16  AST 25 15 17   ALT 16 13 16   ALKPHOS 110 92 108  BILITOT 0.8  --   --   PROT 6.8  --   --   ALBUMIN 4.2  --   --    Recent Labs    07/13/16 1420  07/14/16 0638 07/15/16 0409 07/24/16 08/13/16  WBC 8.2  --  5.8 6.5 5.3 6.8  NEUTROABS 5.7  --   --   --   --   --   HGB 14.4  --  13.2 13.3 13.1 13.5  HCT 43.9   < > 41.3 41.3 39 41  MCV 94.2  --  97.9 95.6  --   --   PLT 213  --  210 191 222 264   < > = values in this interval not displayed.   Lab Results  Component Value Date   TSH 1.57 12/25/2015   Lab Results  Component Value Date   HGBA1C 5.5 12/02/2016   Lab Results  Component Value Date   CHOL 174 10/21/2016   HDL 45 10/21/2016   LDLCALC 103  10/21/2016   TRIG 158 10/21/2016    Significant Diagnostic Results in last 30 days:  No results found.  Assessment/Plan  Unsteady gait With recent fall due to loss of balance. Fall precautions. Monitor clinically  Depression and anxiety Stable overall per nursing. Continue risperdal 0.25 mg bid- recent GDR. Continue mirtazapine 15 mg daily and zoloft 100 mg daily. Will not make changes to her meds for now with her mood overall stable and recent fall.   Hypertension Some soft BP readings of 110/60, 100/60. Recent fall with poor balance. Currently on metoprolol tartrate 25 mg bid. Decrease this to 12.5 mg bid and monitor. Check bp   Chronic embolism Continue xarelto for long term anticoagulation for stroke prevention. Check cbc periodically. No bleed reported   Family/ staff Communication: reviewed care plan with patient and charge nurse.    Labs/tests ordered:  none   Blanchie Serve, MD Internal Medicine [facility] [address] [place], [place] [address] Cell Phone (Monday-Friday 8 am - 5 pm): [phone] On Call: [phone] and follow prompts after 5 pm and on weekends Office Phone: [phone] Office Fax: [phone]

## 2016-12-17 ENCOUNTER — Encounter: Payer: Self-pay | Admitting: Nurse Practitioner

## 2016-12-17 ENCOUNTER — Non-Acute Institutional Stay (SKILLED_NURSING_FACILITY): Payer: Medicare Other | Admitting: Nurse Practitioner

## 2016-12-17 DIAGNOSIS — F0393 Unspecified dementia, unspecified severity, with mood disturbance: Secondary | ICD-10-CM

## 2016-12-17 DIAGNOSIS — I639 Cerebral infarction, unspecified: Secondary | ICD-10-CM | POA: Diagnosis not present

## 2016-12-17 DIAGNOSIS — F0280 Dementia in other diseases classified elsewhere without behavioral disturbance: Secondary | ICD-10-CM | POA: Diagnosis not present

## 2016-12-17 DIAGNOSIS — F329 Major depressive disorder, single episode, unspecified: Secondary | ICD-10-CM

## 2016-12-17 DIAGNOSIS — R4182 Altered mental status, unspecified: Secondary | ICD-10-CM | POA: Diagnosis not present

## 2016-12-17 LAB — CBC AND DIFFERENTIAL
HCT: 38 (ref 36–46)
Hemoglobin: 12.8 (ref 12.0–16.0)
PLATELETS: 217 (ref 150–399)
WBC: 7.6

## 2016-12-17 LAB — BASIC METABOLIC PANEL
BUN: 12 (ref 4–21)
Creatinine: 1 (ref 0.5–1.1)
Glucose: 161
POTASSIUM: 3.6 (ref 3.4–5.3)
Sodium: 139 (ref 137–147)

## 2016-12-17 LAB — HEPATIC FUNCTION PANEL
ALK PHOS: 101 (ref 25–125)
ALT: 14 (ref 7–35)
AST: 17 (ref 13–35)
Bilirubin, Total: 0.5

## 2016-12-17 NOTE — Progress Notes (Signed)
Location:  [place] Room Number: [place]:  SNF (31) Provider:  Brylan Seubert, Manxie  NP  Blanchie Serve, MD  Patient Care Team: Blanchie Serve, MD as PCP - General (Internal Medicine) Exie Chrismer X, NP as Nurse Practitioner (Internal Medicine) Juluis Rainier as Consulting Physician (Optometry) Monna Fam, MD as Consulting Physician (Ophthalmology)  Extended Emergency Contact Information Primary Emergency Contact: [facility] Address: [address], [place] [address] [name] of [place] Phone: [phone] Work Phone: [phone] Mobile Phone: [phone] Relation: Daughter  Code Status:  DNR Goals of care: Advanced Directive information Advanced Directives 12/17/2016  Does Patient Have a Medical Advance Directive? Yes  Type of Paramedic of [place];Living will;Out of facility DNR (pink MOST or yellow form)  Does patient want to make changes to medical advance directive? No - Patient declined  Copy of [place] in Chart? Yes  Pre-existing out of facility DNR order (yellow form or pink MOST form) Yellow form placed in chart (order not valid for inpatient use)     Chief Complaint  Patient presents with  . Acute Visit    Altered mental status    HPI:  Pt is a 83 y.o. female seen today for an acute visit for noted slow mentation w/o no new focal neurological symptoms,  repeated what she was spoke to, followed simple directions such as open eyes, take a deep, squeeze my fingers. She was seen in bed with her daughter presence. POA indicted to avoid hospitalization except treatable medical emergencies like sepsis, fractured bones, or active bleeding. She is afebrile, no constipation, hematuria, dysuria, nausea, vomiting, chest pain, palpitation, cough, or SOB. Stat CBC showed wbc 7.6, Hgb 12.8, plt 217, neutrophil 76.4.  She has history of dementia, psychosis,  managed with Risperdal 0.25mg  bid since 12/02/16 in attempt of GDR, Sertraline 100mg  qd, Mirtaapine 15mg  qhs in Memory Care unit.   Hx of CVA, protein C/S deficiency, chronic Xarelto use, no apparent physical weakness residual except some expressive aphasia.    Past Medical History:  Diagnosis Date  . Acute encephalopathy   . Aphasia following unspecified cerebrovascular disease   . 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 Depression   . Dysphagia following cerebrovascular disease   . Hepatic steatosis   . Hydroureteronephrosis   . Hypertension   . Insomnia   . Macular degeneration   . Major depressive disorder, recurrent ([place])   . Neuralgia and neuritis, unspecified (CODE)   . Nontraumatic intracranial hemorrhage ([place])   . Stroke ([place])   . 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   . 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 ABDOMINAL HYSTERECTOMY    . APPENDECTOMY    . BREAST SURGERY    . MASTECTOMY Right     Allergies  Allergen Reactions  . Hydrocodone Nausea And Vomiting  . Morphine And Related Nausea And Vomiting  . Actonel [Risedronate Sodium] Other (See Comments)    unknown  . Darvon [Propoxyphene Hcl] Other (See Comments)    unknown  . Oxycodone Hcl Other (See Comments)    unknown  . Pneumovax [Pneumococcal Polysaccharide Vaccine] Other (See Comments)    unknown    Outpatient Encounter Medications as of 12/17/2016  Medication Sig  . acetaminophen (TYLENOL) 325 MG tablet  Take 650 mg by mouth every 4 (four) hours as needed for pain.  Marland Kitchen atorvastatin (LIPITOR) 10 MG tablet Take 10 mg by mouth daily.  . bisacodyl (DULCOLAX) 10 MG suppository Place 1 suppository (10 mg total) rectally daily as needed for moderate constipation.  . Calcium Carbonate-Vitamin D (CALTRATE 600+D) 600-400 MG-UNIT tablet Take 1 tablet by mouth 2 (two) times daily.  .  fluticasone (FLONASE) 50 MCG/ACT nasal spray Place 1 spray into both nostrils as needed for allergies.   . hydrocortisone 2.5 % cream Apply 1 application topically 2 (two) times daily as needed. Apply to affected area on face  . metoprolol tartrate (LOPRESSOR) 25 MG tablet Take 12.5 mg by mouth 2 (two) times daily.   . mirtazapine (REMERON) 15 MG tablet Take 1 tablet (15 mg total) by mouth at bedtime.  . Multiple Vitamins-Minerals (I-VITE) TABS Take 1 tablet by mouth daily.  Marland Kitchen nystatin (NYSTATIN) powder Apply topically daily as needed. Under left breast   . omeprazole (PRILOSEC) 20 MG capsule Take 10 mg by mouth daily.   . polyethylene glycol (MIRALAX / GLYCOLAX) packet Take 17 g by mouth daily.  . risperiDONE (RISPERDAL) 0.25 MG tablet Take 0.25 mg by mouth 2 (two) times daily.  . Rivaroxaban (XARELTO) 15 MG TABS tablet Take 1 tablet (15 mg total) by mouth daily at 6 PM.  . sertraline (ZOLOFT) 100 MG tablet Take 1 tablet (100 mg total) by mouth daily at 6 PM.  . Simethicone (GAS RELIEF 80 PO) Take 80 mg by mouth 4 (four) times daily - after meals and at bedtime.   . vitamin B-12 (CYANOCOBALAMIN) 1000 MCG tablet Take 1 tablet (1,000 mcg total) by mouth daily.   No facility-administered encounter medications on file as of 12/17/2016.    ROS was provided with assistance of staff and the patient's daughter.  Review of Systems  Constitutional: Positive for activity change, appetite change and fatigue. Negative for chills, diaphoresis and fever.  HENT: Positive for hearing loss. Negative for congestion, trouble swallowing and voice change.   Eyes: Negative for visual disturbance.  Respiratory: Negative for cough, choking, chest tightness, shortness of breath and wheezing.   Cardiovascular: Negative for chest pain, palpitations and leg swelling.  Gastrointestinal: Negative for abdominal distention, abdominal pain, constipation, diarrhea, nausea and vomiting.  Endocrine: Negative for cold  intolerance.  Genitourinary: Negative for difficulty urinating, dysuria and urgency.  Musculoskeletal: Positive for gait problem. Negative for arthralgias, back pain, joint swelling and myalgias.       One person transfer, ambulates with walker.   Skin: Negative for color change, pallor, rash and wound.  Neurological: Positive for speech difficulty. Negative for tremors, facial asymmetry, weakness, numbness and headaches.       Dementia, expressive aphasia  Psychiatric/Behavioral: Positive for confusion and decreased concentration. Negative for agitation, behavioral problems, hallucinations and sleep disturbance. The patient is not nervous/anxious.     Immunization History  Administered Date(s) Administered  . Influenza-Unspecified 11/20/2013, 09/26/2014, 10/24/2015   Pertinent  Health Maintenance Due  Topic Date Due  . DEXA SCAN  07/18/1994  . PNA vac Low Risk Adult (1 of 2 - PCV13) 07/18/1994  . INFLUENZA VACCINE  08/06/2016   Fall Risk  09/05/2016  Falls in the past year? No   Functional Status Survey:    Vitals:   12/17/16 1019  BP: 104/70  Pulse: 68  Resp: 20  Temp: 98.3 F (36.8 C)  SpO2: 96%  Weight: 160 lb 9.6 oz (72.8 kg)  Height:  5\' 3"  (1.6 m)   Body mass index is 28.45 kg/m. Physical Exam  Constitutional: She appears well-developed and well-nourished. No distress.  HENT:  Head: Normocephalic and atraumatic.  Eyes: Conjunctivae and EOM are normal. Pupils are equal, round, and reactive to light. Right eye exhibits no discharge. Left eye exhibits no discharge.  Neck: Normal range of motion. Neck supple. No JVD present. No thyromegaly present.  Cardiovascular: Normal rate, regular rhythm and normal heart sounds.  No murmur heard. Pulmonary/Chest: Effort normal and breath sounds normal. No respiratory distress. She has no wheezes. She has no rales.  Abdominal: Soft. Bowel sounds are normal. She exhibits no distension. There is no tenderness. There is no rebound  and no guarding.  Musculoskeletal: Normal range of motion. She exhibits no edema or tenderness.  Neurological: She is alert. She exhibits normal muscle tone. Coordination normal.  Slow mentation, oriented to self.   Skin: Skin is warm and dry. No rash noted. She is not diaphoretic. No erythema. No pallor.  Psychiatric: She has a normal mood and affect. Her behavior is normal.    Labs reviewed: Recent Labs    07/13/16 1420 07/14/16 0638 07/15/16 0409 07/17/16 0627 07/24/16 08/13/16  NA 140 142 138 139 142 137  K 3.5 3.7 3.3* 3.8 3.6 3.7  CL 109 111 107 108  --   --   CO2 23 23 23 26   --   --   GLUCOSE 113* 94 88 97  --   --   BUN 10 7 9 10 11 14   CREATININE 0.99 1.01* 1.04* 0.91 1.1 1.0  CALCIUM 9.2 8.8* 9.0 9.0  --   --   MG 2.4  --   --   --   --   --   PHOS 2.3*  --   --   --   --   --    Recent Labs    07/13/16 1420 07/24/16 08/13/16  AST 25 15 17   ALT 16 13 16   ALKPHOS 110 92 108  BILITOT 0.8  --   --   PROT 6.8  --   --   ALBUMIN 4.2  --   --    Recent Labs    07/13/16 1420  07/14/16 0638 07/15/16 0409 07/24/16 08/13/16  WBC 8.2  --  5.8 6.5 5.3 6.8  NEUTROABS 5.7  --   --   --   --   --   HGB 14.4  --  13.2 13.3 13.1 13.5  HCT 43.9   < > 41.3 41.3 39 41  MCV 94.2  --  97.9 95.6  --   --   PLT 213  --  210 191 222 264   < > = values in this interval not displayed.   Lab Results  Component Value Date   TSH 1.57 12/25/2015   Lab Results  Component Value Date   HGBA1C 5.5 12/02/2016   Lab Results  Component Value Date   CHOL 174 10/21/2016   HDL 45 10/21/2016   LDLCALC 103 10/21/2016   TRIG 158 10/21/2016    Significant Diagnostic Results in last 30 days:  No results found.  Assessment/Plan Altered mental status  slow mentation w/o no new focal neurological symptoms,  repeated what she was spoke to, followed simple directions such as open eyes, take a deep, squeeze my fingers. She was seen in bed with her daughter presence. POA indicted to avoid  hospitalization except treatable medical emergencies like sepsis, fractured bones,  or active bleeding. She is afebrile, no constipation, hematuria, dysuria, nausea, vomiting, chest pain, palpitation, cough, or SOB. Stat CBC showed wbc 7.6, Hgb 12.8, plt 217, neutrophil 76.4. Pending CMP. Continue to observe.    Depression due to dementia She has history of dementia, psychosis, managed with Risperdal 0.25mg  bid since 12/02/16 in attempt of GDR, Sertraline 100mg  qd, Mirtaapine 15mg  qhs in Memory Care unit.    CVA (cerebral vascular accident) Hx of CVA, protein C/S deficiency, chronic Xarelto use, no apparent physical weakness residual.      Family/ staff Communication:   Labs/tests ordered:  CBC/diff, CMP  Time spend 25 minutes.

## 2016-12-17 NOTE — Assessment & Plan Note (Signed)
slow mentation w/o no new focal neurological symptoms,  repeated what she was spoke to, followed simple directions such as open eyes, take a deep, squeeze my fingers. She was seen in bed with her daughter presence. POA indicted to avoid hospitalization except treatable medical emergencies like sepsis, fractured bones, or active bleeding. She is afebrile, no constipation, hematuria, dysuria, nausea, vomiting, chest pain, palpitation, cough, or SOB. Stat CBC showed wbc 7.6, Hgb 12.8, plt 217, neutrophil 76.4. Pending CMP. Continue to observe.

## 2016-12-17 NOTE — Assessment & Plan Note (Signed)
She has history of dementia, psychosis, managed with Risperdal 0.25mg  bid since 12/02/16 in attempt of GDR, Sertraline 100mg  qd, Mirtaapine 15mg  qhs in Memory Care unit.

## 2016-12-17 NOTE — Assessment & Plan Note (Signed)
Hx of CVA, protein C/S deficiency, chronic Xarelto use, no apparent physical weakness residual.

## 2017-01-09 ENCOUNTER — Encounter: Payer: Self-pay | Admitting: Nurse Practitioner

## 2017-01-09 ENCOUNTER — Non-Acute Institutional Stay (SKILLED_NURSING_FACILITY): Payer: Medicare Other | Admitting: Nurse Practitioner

## 2017-01-09 DIAGNOSIS — F0280 Dementia in other diseases classified elsewhere without behavioral disturbance: Secondary | ICD-10-CM | POA: Diagnosis not present

## 2017-01-09 DIAGNOSIS — K219 Gastro-esophageal reflux disease without esophagitis: Secondary | ICD-10-CM | POA: Diagnosis not present

## 2017-01-09 DIAGNOSIS — Z7901 Long term (current) use of anticoagulants: Secondary | ICD-10-CM

## 2017-01-09 DIAGNOSIS — K5909 Other constipation: Secondary | ICD-10-CM

## 2017-01-09 DIAGNOSIS — F329 Major depressive disorder, single episode, unspecified: Secondary | ICD-10-CM | POA: Diagnosis not present

## 2017-01-09 DIAGNOSIS — F0151 Vascular dementia with behavioral disturbance: Secondary | ICD-10-CM | POA: Diagnosis not present

## 2017-01-09 DIAGNOSIS — F0393 Unspecified dementia, unspecified severity, with mood disturbance: Secondary | ICD-10-CM

## 2017-01-09 DIAGNOSIS — I10 Essential (primary) hypertension: Secondary | ICD-10-CM | POA: Diagnosis not present

## 2017-01-09 DIAGNOSIS — F01518 Vascular dementia, unspecified severity, with other behavioral disturbance: Secondary | ICD-10-CM

## 2017-01-09 NOTE — Assessment & Plan Note (Signed)
GERD stable, continue Omeprazole 10mg qd.   

## 2017-01-09 NOTE — Progress Notes (Signed)
Location:  [place] Room Number: [place]:  SNF (31) Provider:  Kalum Minner, Manxie  NP  Blanchie Serve, MD  Patient Care Team: Blanchie Serve, MD as PCP - General (Internal Medicine) Anamae Rochelle X, NP as Nurse Practitioner (Internal Medicine) Juluis Rainier as Consulting Physician (Optometry) Monna Fam, MD as Consulting Physician (Ophthalmology)  Extended Emergency Contact Information Primary Emergency Contact: [facility] Address: [address], [place] [address] [name] of [place] Phone: [phone] Work Phone: [phone] Mobile Phone: [phone] Relation: Daughter  Code Status:  DNR Goals of care: Advanced Directive information Advanced Directives 01/09/2017  Does Patient Have a Medical Advance Directive? Yes  Type of Paramedic of [place];Living will;Out of facility DNR (pink MOST or yellow form)  Does patient want to make changes to medical advance directive? No - Patient declined  Copy of [place] in Chart? Yes  Pre-existing out of facility DNR order (yellow form or pink MOST form) Yellow form placed in chart (order not valid for inpatient use)     Chief Complaint  Patient presents with  . Medical Management of Chronic Issues    HPI:  Pt is a 84 y.o. female seen today for medical management of chronic diseases.     The patient has history of dementia, psychosis, resides in memory care unit, [facility], she ambulates with walker, transfers self, uses commode, but has urinary incontinent episodes. On Risperdal 0.25mg  bid, her mood is stable on Mirtazapine 7.5mg , Sertraline 100mg  po daily. She has protein C/S deficiency, hx of DVT, s/p CVA, on Xarelto 15mg  po daily. No constipation while on MiraLax daily, DulcoLax 10mg  suppository prn, GERD stable on Omeprazole 10mg  qd. Blood pressure is controlled on Metoprolol 12.5mg  bid.    Past Medical History:    Diagnosis Date  . Acute encephalopathy   . Aphasia following unspecified cerebrovascular disease   . 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 Depression   . Dysphagia following cerebrovascular disease   . Hepatic steatosis   . Hydroureteronephrosis   . Hypertension   . Insomnia   . Macular degeneration   . Major depressive disorder, recurrent ([place])   . Neuralgia and neuritis, unspecified (CODE)   . Nontraumatic intracranial hemorrhage ([place])   . Stroke ([place])   . 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   . 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 ABDOMINAL HYSTERECTOMY    . APPENDECTOMY    . BREAST SURGERY    . MASTECTOMY Right     Allergies  Allergen Reactions  . Hydrocodone Nausea And Vomiting  . Morphine And Related Nausea And Vomiting  . Actonel [Risedronate Sodium] Other (See Comments)    unknown  . Darvon [Propoxyphene Hcl] Other (See Comments)    unknown  . Oxycodone Hcl Other (See Comments)    unknown  . Pneumovax [Pneumococcal Polysaccharide Vaccine] Other (See Comments)    unknown    Outpatient Encounter Medications as of 01/09/2017  Medication Sig  . acetaminophen (TYLENOL) 325 MG tablet Take 650 mg by mouth every 4 (four) hours as needed for pain.  Marland Kitchen atorvastatin (LIPITOR) 10 MG tablet Take 10 mg by mouth daily.  . bisacodyl (DULCOLAX) 10 MG suppository Place 1 suppository (10 mg total) rectally daily as needed for moderate  constipation.  . Calcium Carbonate-Vitamin D (CALTRATE 600+D) 600-400 MG-UNIT tablet Take 1 tablet by mouth 2 (two) times daily.  . fluticasone (FLONASE) 50 MCG/ACT nasal spray Place 1 spray into both nostrils as needed for allergies.   . hydrocortisone 2.5 % cream Apply 1 application topically 2 (two) times daily as needed. Apply to affected area on face  . metoprolol tartrate (LOPRESSOR) 25 MG tablet Take 12.5 mg by  mouth 2 (two) times daily.   . mirtazapine (REMERON) 15 MG tablet Take 1 tablet (15 mg total) by mouth at bedtime.  . Multiple Vitamins-Minerals (I-VITE) TABS Take 1 tablet by mouth daily.  Marland Kitchen nystatin (NYSTATIN) powder Apply topically daily as needed. Under left breast   . omeprazole (PRILOSEC) 20 MG capsule Take 10 mg by mouth daily.   . polyethylene glycol (MIRALAX / GLYCOLAX) packet Take 17 g by mouth daily.  . risperiDONE (RISPERDAL) 0.25 MG tablet Take 0.25 mg by mouth 2 (two) times daily.  . Rivaroxaban (XARELTO) 15 MG TABS tablet Take 1 tablet (15 mg total) by mouth daily at 6 PM.  . sertraline (ZOLOFT) 100 MG tablet Take 1 tablet (100 mg total) by mouth daily at 6 PM.  . Simethicone (GAS RELIEF 80 PO) Take 80 mg by mouth 4 (four) times daily - after meals and at bedtime.   . vitamin B-12 (CYANOCOBALAMIN) 1000 MCG tablet Take 1 tablet (1,000 mcg total) by mouth daily.   No facility-administered encounter medications on file as of 01/09/2017.     Review of Systems  Constitutional: Negative for activity change, appetite change, chills, diaphoresis, fatigue and fever.  HENT: Positive for hearing loss. Negative for congestion, trouble swallowing and voice change.   Eyes: Negative for visual disturbance.  Respiratory: Negative for cough, choking, chest tightness, shortness of breath and wheezing.   Cardiovascular: Negative for chest pain, palpitations and leg swelling.  Gastrointestinal: Negative for abdominal pain, constipation, diarrhea, nausea and vomiting.  Endocrine: Negative for cold intolerance.  Genitourinary: Negative for difficulty urinating, dysuria and urgency.       Urinary leakage, she does take herself to coomode  Musculoskeletal: Positive for gait problem.  Skin: Negative for color change, pallor and wound.  Neurological: Negative for tremors, speech difficulty, weakness and headaches.       Dementia.   Psychiatric/Behavioral: Positive for confusion. Negative for  agitation, behavioral problems, hallucinations and sleep disturbance. The patient is not nervous/anxious.     Immunization History  Administered Date(s) Administered  . Influenza-Unspecified 11/20/2013, 09/26/2014, 10/24/2015   Pertinent  Health Maintenance Due  Topic Date Due  . DEXA SCAN  07/18/1994  . PNA vac Low Risk Adult (1 of 2 - PCV13) 07/18/1994  . INFLUENZA VACCINE  08/06/2016   Fall Risk  09/05/2016  Falls in the past year? No   Functional Status Survey:    Vitals:   01/09/17 1304  BP: 130/70  Pulse: 74  Resp: 20  Temp: 98.2 F (36.8 C)  Weight: 162 lb 12.8 oz (73.8 kg)  Height: 5\' 3"  (1.6 m)   Body mass index is 28.84 kg/m. Physical Exam  Constitutional: She appears well-developed and well-nourished.  HENT:  Head: Normocephalic and atraumatic.  Eyes: Conjunctivae and EOM are normal. Pupils are equal, round, and reactive to light.  Neck: Normal range of motion. Neck supple. No JVD present. No thyromegaly present.  Cardiovascular: Normal rate, regular rhythm and normal heart sounds.  No murmur heard. Pulmonary/Chest: Effort normal and breath sounds normal. She has no wheezes.  She has no rales.  Abdominal: Soft. Bowel sounds are normal. She exhibits no distension. There is no tenderness.  Musculoskeletal: Normal range of motion. She exhibits no edema.  Ambulates with walker, transfers self.   Neurological: She is alert. She exhibits normal muscle tone. Coordination normal.  She oriented to herself  Skin: Skin is warm and dry.  S/p right mastectomy scar  Psychiatric: She has a normal mood and affect. Her behavior is normal.    Labs reviewed: Recent Labs    07/13/16 1420 07/14/16 0638 07/15/16 0409 07/17/16 0627 07/24/16 08/13/16  NA 140 142 138 139 142 137  K 3.5 3.7 3.3* 3.8 3.6 3.7  CL 109 111 107 108  --   --   CO2 23 23 23 26   --   --   GLUCOSE 113* 94 88 97  --   --   BUN 10 7 9 10 11 14   CREATININE 0.99 1.01* 1.04* 0.91 1.1 1.0  CALCIUM 9.2  8.8* 9.0 9.0  --   --   MG 2.4  --   --   --   --   --   PHOS 2.3*  --   --   --   --   --    Recent Labs    07/13/16 1420 07/24/16 08/13/16  AST 25 15 17   ALT 16 13 16   ALKPHOS 110 92 108  BILITOT 0.8  --   --   PROT 6.8  --   --   ALBUMIN 4.2  --   --    Recent Labs    07/13/16 1420  07/14/16 0638 07/15/16 0409 07/24/16 08/13/16  WBC 8.2  --  5.8 6.5 5.3 6.8  NEUTROABS 5.7  --   --   --   --   --   HGB 14.4  --  13.2 13.3 13.1 13.5  HCT 43.9   < > 41.3 41.3 39 41  MCV 94.2  --  97.9 95.6  --   --   PLT 213  --  210 191 222 264   < > = values in this interval not displayed.   Lab Results  Component Value Date   TSH 1.57 12/25/2015   Lab Results  Component Value Date   HGBA1C 5.5 12/02/2016   Lab Results  Component Value Date   CHOL 174 10/21/2016   HDL 45 10/21/2016   LDLCALC 103 10/21/2016   TRIG 158 10/21/2016    Significant Diagnostic Results in last 30 days:  No results found.  Assessment/Plan Vascular dementia with behavior disturbance dementia, psychosis, resides in memory care unit, [facility], she ambulates with walker, transfers self, uses commode, but has urinary incontinent episodes. Continue Risperdal 0.25mg  bid  Depression due to dementia her mood is stable, continue Mirtazapine 7.5mg , Sertraline 100mg  po daily.   Chronic anticoagulation She has protein C/S deficiency, hx of DVT, s/p CVA, continue Xarelto 15mg  po daily.   Chronic constipation No constipation, continue MiraLax daily, DulcoLax 10mg  suppository prn.    GERD (gastroesophageal reflux disease) GERD stable, continue Omeprazole 10mg  qd.   Hypertension Blood pressure is controlled, continue Metoprolol 12.5mg  bid     Family/ staff Communication: plan of care reviewed with the patient and charge nurse  Labs/tests ordered:  none  Time spend 25 minutes.

## 2017-01-09 NOTE — Assessment & Plan Note (Signed)
Blood pressure is controlled, continue Metoprolol 12.5mg bid.  

## 2017-01-09 NOTE — Assessment & Plan Note (Signed)
She has protein C/S deficiency, hx of DVT, s/p CVA, continue  Xarelto 15mg po daily.  

## 2017-01-09 NOTE — Assessment & Plan Note (Addendum)
her mood is stable, continue Mirtazapine 7.5mg , Sertraline 100mg  po daily.

## 2017-01-09 NOTE — Assessment & Plan Note (Signed)
dementia, psychosis, resides in memory care unit, [facility], she ambulates with walker, transfers self, uses commode, but has urinary incontinent episodes. Continue Risperdal 0.25mg  bid

## 2017-01-09 NOTE — Assessment & Plan Note (Signed)
No constipation, continue MiraLax daily, DulcoLax 10mg  suppository prn.

## 2017-02-06 ENCOUNTER — Encounter: Payer: Self-pay | Admitting: Nurse Practitioner

## 2017-02-06 ENCOUNTER — Non-Acute Institutional Stay (SKILLED_NURSING_FACILITY): Payer: Medicare Other | Admitting: Nurse Practitioner

## 2017-02-06 DIAGNOSIS — F0151 Vascular dementia with behavioral disturbance: Secondary | ICD-10-CM | POA: Diagnosis not present

## 2017-02-06 DIAGNOSIS — F329 Major depressive disorder, single episode, unspecified: Secondary | ICD-10-CM | POA: Diagnosis not present

## 2017-02-06 DIAGNOSIS — I10 Essential (primary) hypertension: Secondary | ICD-10-CM

## 2017-02-06 DIAGNOSIS — F0280 Dementia in other diseases classified elsewhere without behavioral disturbance: Secondary | ICD-10-CM | POA: Diagnosis not present

## 2017-02-06 DIAGNOSIS — Z7901 Long term (current) use of anticoagulants: Secondary | ICD-10-CM

## 2017-02-06 DIAGNOSIS — F01518 Vascular dementia, unspecified severity, with other behavioral disturbance: Secondary | ICD-10-CM

## 2017-02-06 DIAGNOSIS — F0393 Unspecified dementia, unspecified severity, with mood disturbance: Secondary | ICD-10-CM

## 2017-02-06 NOTE — Progress Notes (Signed)
Location:   SNF [place] Room Number: 185 UDJSH of Service:  SNF (31) Provider: Lennie Odor Porsha Skilton NP  Blanchie Serve, MD  Patient Care Team: Blanchie Serve, MD as PCP - General (Internal Medicine) Deforrest Bogle X, NP as Nurse Practitioner (Internal Medicine) Juluis Rainier as Consulting Physician (Optometry) Monna Fam, MD as Consulting Physician (Ophthalmology)  Extended Emergency Contact Information Primary Emergency Contact: [facility] Address: [address], [place] [address] [name] of [place] Phone: [phone] Work Phone: [phone] Mobile Phone: [phone] Relation: Daughter  Code Status: DNR Goals of care: Advanced Directive information Advanced Directives 01/09/2017  Does Patient Have a Medical Advance Directive? Yes  Type of Paramedic of [place];Living will;Out of facility DNR (pink MOST or yellow form)  Does patient want to make changes to medical advance directive? No - Patient declined  Copy of [place] in Chart? Yes  Pre-existing out of facility DNR order (yellow form or pink MOST form) Yellow form placed in chart (order not valid for inpatient use)     Chief Complaint  Patient presents with  . Acute Visit    medication refusal    HPI:  Pt is a 84 y.o. female seen today for an acute visit for staff reported the patient has increased episodes of medication refusal, his of dementia, progressing, resides in memory care unit [facility], ambulates with walker, self transfer, incontinent of urine, not on memory preserving meds. Her psychosis and mood has been stabilized on Risperdal 0.25mg  bid, Sertraline 100mg  daily, Mirtazapine 15mg  daily. Her blood pressure is controlled on Metoprolol 12.5mg  bid, chronic Rivaroxaban 15mg  daily for protein C/S deficiency and hx of DVT   Past Medical History:  Diagnosis Date  . Acute encephalopathy   . Aphasia following unspecified  cerebrovascular disease   . 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 Depression   . Dysphagia following cerebrovascular disease   . Hepatic steatosis   . Hydroureteronephrosis   . Hypertension   . Insomnia   . Macular degeneration   . Major depressive disorder, recurrent ([place])   . Neuralgia and neuritis, unspecified (CODE)   . Nontraumatic intracranial hemorrhage ([place])   . Stroke ([place])   . 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   . 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 ABDOMINAL HYSTERECTOMY    . APPENDECTOMY    . BREAST SURGERY    . MASTECTOMY Right     Allergies  Allergen Reactions  . Hydrocodone Nausea And Vomiting  . Morphine And Related Nausea And Vomiting  . Actonel [Risedronate Sodium] Other (See Comments)    unknown  . Darvon [Propoxyphene Hcl] Other (See Comments)    unknown  . Oxycodone Hcl Other (See Comments)    unknown  . Pneumovax [Pneumococcal Polysaccharide Vaccine] Other (See Comments)    unknown    Allergies as of 02/06/2017      Reactions   Hydrocodone Nausea And Vomiting   Morphine And Related Nausea And Vomiting   Actonel [risedronate Sodium] Other (See Comments)   unknown   Darvon [propoxyphene Hcl] Other (See Comments)   unknown   Oxycodone Hcl Other (See Comments)   unknown   Pneumovax [pneumococcal Polysaccharide Vaccine] Other (See Comments)   unknown      Medication List  Accurate as of 02/06/17 11:59 PM. Always use your most recent med list.          acetaminophen 325 MG tablet Commonly known as:  TYLENOL Take 650 mg by mouth every 4 (four) hours as needed for pain.   atorvastatin 10 MG tablet Commonly known as:  LIPITOR Take 10 mg by mouth daily.   bisacodyl 10 MG suppository Commonly known as:  DULCOLAX Place 1 suppository (10 mg total) rectally daily as needed for moderate constipation.     CALTRATE 600+D 600-400 MG-UNIT tablet Generic drug:  Calcium Carbonate-Vitamin D Take 1 tablet by mouth 2 (two) times daily.   fluticasone 50 MCG/ACT nasal spray Commonly known as:  FLONASE Place 1 spray into both nostrils as needed for allergies.   GAS RELIEF 80 PO Take 80 mg by mouth 4 (four) times daily - after meals and at bedtime.   hydrocortisone 2.5 % cream Apply 1 application topically 2 (two) times daily as needed. Apply to affected area on face   I-VITE Tabs Take 1 tablet by mouth daily.   metoprolol tartrate 25 MG tablet Commonly known as:  LOPRESSOR Take 12.5 mg by mouth 2 (two) times daily.   mirtazapine 15 MG tablet Commonly known as:  REMERON Take 1 tablet (15 mg total) by mouth at bedtime.   nystatin powder Generic drug:  nystatin Apply topically daily as needed. Under left breast   omeprazole 20 MG capsule Commonly known as:  PRILOSEC Take 10 mg by mouth daily.   polyethylene glycol packet Commonly known as:  MIRALAX / GLYCOLAX Take 17 g by mouth daily.   risperiDONE 0.25 MG tablet Commonly known as:  RISPERDAL Take 0.25 mg by mouth 2 (two) times daily.   Rivaroxaban 15 MG Tabs tablet Commonly known as:  XARELTO Take 1 tablet (15 mg total) by mouth daily at 6 PM.   sertraline 100 MG tablet Commonly known as:  ZOLOFT Take 1 tablet (100 mg total) by mouth daily at 6 PM.   vitamin B-12 1000 MCG tablet Commonly known as:  CYANOCOBALAMIN Take 1 tablet (1,000 mcg total) by mouth daily.       Review of Systems  Constitutional: Negative for activity change, appetite change, chills, diaphoresis, fatigue and fever.  HENT: Negative for trouble swallowing and voice change.   Respiratory: Negative for cough, shortness of breath and wheezing.   Cardiovascular: Negative for chest pain, palpitations and leg swelling.  Genitourinary: Negative for difficulty urinating, dysuria and urgency.       Incontinent of urine.   Musculoskeletal: Positive for gait  problem.  Neurological: Positive for speech difficulty. Negative for dizziness, tremors, weakness, numbness and headaches.       Dementia  Psychiatric/Behavioral: Positive for behavioral problems and confusion. Negative for agitation, hallucinations and sleep disturbance. The patient is not nervous/anxious.     Immunization History  Administered Date(s) Administered  . Influenza-Unspecified 11/20/2013, 09/26/2014, 10/24/2015   Pertinent  Health Maintenance Due  Topic Date Due  . DEXA SCAN  07/18/1994  . PNA vac Low Risk Adult (1 of 2 - PCV13) 07/18/1994  . INFLUENZA VACCINE  08/06/2016   Fall Risk  09/05/2016  Falls in the past year? No   Functional Status Survey:    Vitals:   02/06/17 1615  BP: 120/64  Pulse: 64  Resp: 18  Temp: 98.2 F (36.8 C)   There is no height or weight on file to calculate BMI. Physical Exam  Constitutional: She appears well-developed and well-nourished.  HENT:  Head: Normocephalic and atraumatic.  Eyes: Conjunctivae and EOM are normal. Pupils are equal, round, and reactive to light.  Neck: Normal range of motion. Neck supple. No JVD present. No thyromegaly present.  Cardiovascular: Normal rate, regular rhythm and normal heart sounds.  No murmur heard. Pulmonary/Chest: Effort normal and breath sounds normal. She has no wheezes. She has no rales.  Abdominal: Soft. Bowel sounds are normal. She exhibits no distension. There is no tenderness.  Musculoskeletal: She exhibits no edema.  Ambulates with walker.   Neurological: She is alert.  Oriented to self.   Skin: Skin is warm and dry.  Psychiatric: She has a normal mood and affect.  Pleasant and smiled during today's visit.     Labs reviewed: Recent Labs    07/13/16 1420 07/14/16 0638 07/15/16 0409 07/17/16 0627 07/24/16 08/13/16  NA 140 142 138 139 142 137  K 3.5 3.7 3.3* 3.8 3.6 3.7  CL 109 111 107 108  --   --   CO2 23 23 23 26   --   --   GLUCOSE 113* 94 88 97  --   --   BUN 10 7 9  10 11 14   CREATININE 0.99 1.01* 1.04* 0.91 1.1 1.0  CALCIUM 9.2 8.8* 9.0 9.0  --   --   MG 2.4  --   --   --   --   --   PHOS 2.3*  --   --   --   --   --    Recent Labs    07/13/16 1420 07/24/16 08/13/16  AST 25 15 17   ALT 16 13 16   ALKPHOS 110 92 108  BILITOT 0.8  --   --   PROT 6.8  --   --   ALBUMIN 4.2  --   --    Recent Labs    07/13/16 1420  07/14/16 0638 07/15/16 0409 07/24/16 08/13/16  WBC 8.2  --  5.8 6.5 5.3 6.8  NEUTROABS 5.7  --   --   --   --   --   HGB 14.4  --  13.2 13.3 13.1 13.5  HCT 43.9   < > 41.3 41.3 39 41  MCV 94.2  --  97.9 95.6  --   --   PLT 213  --  210 191 222 264   < > = values in this interval not displayed.   Lab Results  Component Value Date   TSH 1.57 12/25/2015   Lab Results  Component Value Date   HGBA1C 5.5 12/02/2016   Lab Results  Component Value Date   CHOL 174 10/21/2016   HDL 45 10/21/2016   LDLCALC 103 10/21/2016   TRIG 158 10/21/2016    Significant Diagnostic Results in last 30 days:  No results found.  Assessment/Plan: Vascular dementia with behavior disturbance staff reported the patient has increased episodes of medication refusal, his of dementia, progressing, resides in memory care unit [facility], ambulates with walker, self transfer, incontinent of urine, not on memory preserving meds. Her psychosis and mood has been stabilized on Risperdal 0.25mg  bid, Sertraline 100mg  daily, Mirtazapine 15mg  daily. Will simplify meds: discontinue non essential meds first, dc MVI and Ca/Vit D.   Depression due to dementia Stable mood, continue Sertraline and Mirtazapine.   Hypertension Her blood pressure is controlled, continue Metoprolol 12.5mg  bid  Chronic anticoagulation chronic Rivaroxaban 15mg  daily for protein C/S deficiency and hx of DVT    Family/ staff Communication: plan of care  reviewed with the patient and charge nurse.   Labs/tests ordered: none  Time spend 25 minutes.

## 2017-02-06 NOTE — Assessment & Plan Note (Signed)
staff reported the patient has increased episodes of medication refusal, his of dementia, progressing, resides in memory care unit [facility], ambulates with walker, self transfer, incontinent of urine, not on memory preserving meds. Her psychosis and mood has been stabilized on Risperdal 0.25mg  bid, Sertraline 100mg  daily, Mirtazapine 15mg  daily. Will simplify meds: discontinue non essential meds first, dc MVI and Ca/Vit D.

## 2017-02-06 NOTE — Assessment & Plan Note (Signed)
Stable mood, continue Sertraline and Mirtazapine.

## 2017-02-06 NOTE — Assessment & Plan Note (Signed)
Her blood pressure is controlled, continue Metoprolol 12.5mg bid 

## 2017-02-06 NOTE — Assessment & Plan Note (Signed)
chronic Rivaroxaban 15mg  daily for protein C/S deficiency and hx of DVT

## 2017-02-11 ENCOUNTER — Encounter: Payer: Self-pay | Admitting: Nurse Practitioner

## 2017-02-11 ENCOUNTER — Non-Acute Institutional Stay (SKILLED_NURSING_FACILITY): Payer: Medicare Other | Admitting: Nurse Practitioner

## 2017-02-11 DIAGNOSIS — F0151 Vascular dementia with behavioral disturbance: Secondary | ICD-10-CM | POA: Diagnosis not present

## 2017-02-11 DIAGNOSIS — R221 Localized swelling, mass and lump, neck: Secondary | ICD-10-CM

## 2017-02-11 DIAGNOSIS — F01518 Vascular dementia, unspecified severity, with other behavioral disturbance: Secondary | ICD-10-CM

## 2017-02-11 NOTE — Assessment & Plan Note (Signed)
More prominent appearance anterior neck, no pain palpated, unable to feel nodule or mass, no heat or pulsating palpated, will obtain TSH, free T4, and [place] of thyroid. Will delay out of facility workups since the patient will be overwhelmed once she is out of her familiar environment related to her advanced dementia.

## 2017-02-11 NOTE — Assessment & Plan Note (Signed)
The patient has dementia with psychosis, unable to obtain reliable HPI. Continue Mirtazapine, Sertraline, Risperidone for mood and psychosis management.

## 2017-02-11 NOTE — Progress Notes (Signed)
Location:  [facility] Room Number: [place]:  SNF (31) Provider:  Nyasha Rahilly, [place]  NP  Blanchie Serve, MD  Patient Care Team: Blanchie Serve, MD as PCP - General (Internal Medicine) Kjerstin Abrigo X, NP as Nurse Practitioner (Internal Medicine) Juluis Rainier as Consulting Physician (Optometry) Monna Fam, MD as Consulting Physician (Ophthalmology)  Extended Emergency Contact Information Primary Emergency Contact: [facility] Address: [address], [place] [address] [name] of [place] Phone: [phone] Work Phone: [phone] Mobile Phone: [phone] Relation: Daughter  Code Status:  DNR Goals of care: Advanced Directive information Advanced Directives 02/11/2017  Does Patient Have a Medical Advance Directive? Yes  Type of Paramedic of [place];Living will;Out of facility DNR (pink MOST or yellow form)  Does patient want to make changes to medical advance directive? No - Patient declined  Copy of [place] in Chart? Yes  Pre-existing out of facility DNR order (yellow form or pink MOST form) Yellow form placed in chart (order not valid for inpatient use)     Chief Complaint  Patient presents with  . Acute Visit    staff concerned about patient neck being enlarged    HPI:  Pt is a 84 y.o. female seen today for an acute visit for reported the enlarged neck appearance, the patient denied pain, difficulty breathing, swallowing, or sore throat, no change of ROM of neck upon my visit today. The patient's POA daughter [name] stated she and her siblings all have hypothyroidism. The patient has dementia with psychosis, unable to obtain reliable HPI.    Past Medical History:  Diagnosis Date  . Acute encephalopathy   . Aphasia following unspecified cerebrovascular disease   . 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 Depression   . Dysphagia  following cerebrovascular disease   . Hepatic steatosis   . Hydroureteronephrosis   . Hypertension   . Insomnia   . Macular degeneration   . Major depressive disorder, recurrent ([place])   . Neuralgia and neuritis, unspecified ([place])   . Nontraumatic intracranial hemorrhage ([place])   . Stroke ([place])   . 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   . 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 ABDOMINAL HYSTERECTOMY    . APPENDECTOMY    . BREAST SURGERY    . MASTECTOMY Right     Allergies  Allergen Reactions  . Hydrocodone Nausea And Vomiting  . Morphine And Related Nausea And Vomiting  . Actonel [Risedronate Sodium] Other (See Comments)    unknown  . Darvon [Propoxyphene Hcl] Other (See Comments)    unknown  . Oxycodone Hcl Other (See Comments)    unknown  . Pneumovax [Pneumococcal Polysaccharide Vaccine] Other (See Comments)    unknown    Outpatient Encounter Medications as of 02/11/2017  Medication Sig  . acetaminophen (TYLENOL) 325 MG tablet Take 650 mg by mouth every 4 (four) hours as needed for pain.  Marland Kitchen atorvastatin (LIPITOR) 10 MG tablet Take 10 mg by mouth daily.  . bisacodyl (DULCOLAX) 10 MG suppository Place 1 suppository (10 mg total) rectally daily as needed for moderate constipation.  . fluticasone (FLONASE) 50 MCG/ACT nasal spray Place 1 spray into both nostrils as needed for allergies.   . hydrocortisone 2.5 % cream Apply 1 application topically  2 (two) times daily as needed. Apply to affected area on face  . metoprolol tartrate (LOPRESSOR) 25 MG tablet Take 12.5 mg by mouth 2 (two) times daily.   . mirtazapine (REMERON) 15 MG tablet Take 1 tablet (15 mg total) by mouth at bedtime.  Marland Kitchen nystatin (NYSTATIN) powder Apply topically daily as needed. Under left breast   . omeprazole (PRILOSEC) 20 MG capsule Take 10 mg by mouth daily.   . polyethylene glycol (MIRALAX /  GLYCOLAX) packet Take 17 g by mouth daily.  . risperiDONE (RISPERDAL) 0.25 MG tablet Take 0.25 mg by mouth 2 (two) times daily.  . Rivaroxaban (XARELTO) 15 MG TABS tablet Take 1 tablet (15 mg total) by mouth daily at 6 PM.  . sertraline (ZOLOFT) 100 MG tablet Take 1 tablet (100 mg total) by mouth daily at 6 PM.  . Simethicone (GAS RELIEF 80 PO) Take 80 mg by mouth 4 (four) times daily - after meals and at bedtime.   . [DISCONTINUED] Calcium Carbonate-Vitamin D (CALTRATE 600+D) 600-400 MG-UNIT tablet Take 1 tablet by mouth 2 (two) times daily.  . [DISCONTINUED] Multiple Vitamins-Minerals (I-VITE) TABS Take 1 tablet by mouth daily.  . [DISCONTINUED] vitamin B-12 (CYANOCOBALAMIN) 1000 MCG tablet Take 1 tablet (1,000 mcg total) by mouth daily.   No facility-administered encounter medications on file as of 02/11/2017.    ROS was provided with assistance of staff Review of Systems  Constitutional: Negative for appetite change, chills, diaphoresis, fatigue and fever.  HENT: Positive for hearing loss and trouble swallowing. Negative for congestion, dental problem, drooling, ear pain, facial swelling, mouth sores, rhinorrhea, sinus pressure, sinus pain, sneezing, sore throat and voice change.   Eyes: Negative for visual disturbance.  Respiratory: Negative for cough, choking, chest tightness, shortness of breath, wheezing and stridor.   Cardiovascular: Negative for chest pain, palpitations and leg swelling.  Musculoskeletal: Positive for gait problem.  Skin: Negative for color change, pallor, rash and wound.  Neurological: Positive for speech difficulty. Negative for tremors, seizures, facial asymmetry, weakness and headaches.       Tangential and slow speech  Hematological: Negative for adenopathy. Bruises/bleeds easily.  Psychiatric/Behavioral: Positive for behavioral problems, confusion and hallucinations. Negative for agitation and sleep disturbance. The patient is not nervous/anxious.      Immunization History  Administered Date(s) Administered  . Influenza-Unspecified 11/20/2013, 09/26/2014, 10/24/2015   Pertinent  Health Maintenance Due  Topic Date Due  . DEXA SCAN  07/18/1994  . PNA vac Low Risk Adult (1 of 2 - PCV13) 07/18/1994  . INFLUENZA VACCINE  08/06/2016   Fall Risk  09/05/2016  Falls in the past year? No   Functional Status Survey:    Vitals:   02/11/17 1434  BP: 126/76  Pulse: 82  Resp: 16  Temp: (!) 97.4 F (36.3 C)  SpO2: 96%  Weight: 164 lb 3.2 oz (74.5 kg)  Height: 5\' 3"  (1.6 m)   Body mass index is 29.09 kg/m. Physical Exam  Constitutional: She appears well-developed and well-nourished. No distress.  HENT:  Head: Normocephalic and atraumatic.  Mouth/Throat: Oropharynx is clear and moist. No oropharyngeal exudate.  Eyes: Conjunctivae are normal. Pupils are equal, round, and reactive to light. No scleral icterus.  Neck: Normal range of motion. Neck supple. Normal carotid pulses and no JVD present. Carotid bruit is not present. No tracheal deviation present.  Anterior neck is prominent appearance, no pain, warmth, or pulsation palpated, unable to feel nodule or mass due to the size of her neck.  Cardiovascular: Normal rate, regular rhythm and normal heart sounds.  No murmur heard. Pulmonary/Chest: Effort normal and breath sounds normal. No stridor. No respiratory distress. She has no wheezes. She has no rales. She exhibits no tenderness.  Musculoskeletal: Normal range of motion. She exhibits no edema or tenderness.  Slightly weak on right side even if muscle strength 5/5  Lymphadenopathy:    She has no cervical adenopathy.  Neurological: She is alert. No cranial nerve deficit. She exhibits normal muscle tone. Coordination normal.  Oriented to self and her room on unit.   Skin: Skin is warm and dry. No rash noted. She is not diaphoretic. No erythema. No pallor.  Psychiatric: She has a normal mood and affect. Her behavior is normal.     Labs reviewed: Recent Labs    07/13/16 1420 07/14/16 0638 07/15/16 0409 07/17/16 0627 07/24/16 08/13/16  NA 140 142 138 139 142 137  K 3.5 3.7 3.3* 3.8 3.6 3.7  CL 109 111 107 108  --   --   CO2 23 23 23 26   --   --   GLUCOSE 113* 94 88 97  --   --   BUN 10 7 9 10 11 14   CREATININE 0.99 1.01* 1.04* 0.91 1.1 1.0  CALCIUM 9.2 8.8* 9.0 9.0  --   --   MG 2.4  --   --   --   --   --   PHOS 2.3*  --   --   --   --   --    Recent Labs    07/13/16 1420 07/24/16 08/13/16  AST 25 15 17   ALT 16 13 16   ALKPHOS 110 92 108  BILITOT 0.8  --   --   PROT 6.8  --   --   ALBUMIN 4.2  --   --    Recent Labs    07/13/16 1420  07/14/16 0638 07/15/16 0409 07/24/16 08/13/16  WBC 8.2  --  5.8 6.5 5.3 6.8  NEUTROABS 5.7  --   --   --   --   --   HGB 14.4  --  13.2 13.3 13.1 13.5  HCT 43.9   < > 41.3 41.3 39 41  MCV 94.2  --  97.9 95.6  --   --   PLT 213  --  210 191 222 264   < > = values in this interval not displayed.   Lab Results  Component Value Date   TSH 1.57 12/25/2015   Lab Results  Component Value Date   HGBA1C 5.5 12/02/2016   Lab Results  Component Value Date   CHOL 174 10/21/2016   HDL 45 10/21/2016   LDLCALC 103 10/21/2016   TRIG 158 10/21/2016    Significant Diagnostic Results in last 30 days:  No results found.  Assessment/Plan Neck swelling More prominent appearance anterior neck, no pain palpated, unable to feel nodule or mass, no heat or pulsating palpated, will obtain TSH, free T4, and [place] of thyroid. Will delay out of facility workups since the patient will be overwhelmed once she is out of her familiar environment related to her advanced dementia.   Vascular dementia with behavior disturbance The patient has dementia with psychosis, unable to obtain reliable HPI. Continue Mirtazapine, Sertraline, Risperidone for mood and psychosis management.        Family/ staff Communication: plan of care reviewed with the patient, the patient's POA daughter  [name], and Camera operator.   Labs/tests ordered:  US thyroid, TSH, free T4  Time spend 25 minutes.

## 2017-02-12 LAB — TSH: TSH: 1.56 (ref <=5.90)

## 2017-02-12 LAB — CBC AND DIFFERENTIAL
HCT: 40 (ref 36–46)
HEMOGLOBIN: 13.6 (ref 12.0–16.0)
PLATELETS: 251 (ref 150–399)
WBC: 5.6

## 2017-02-13 ENCOUNTER — Other Ambulatory Visit: Payer: Self-pay | Admitting: *Deleted

## 2017-02-19 ENCOUNTER — Encounter: Payer: Self-pay | Admitting: Internal Medicine

## 2017-02-19 ENCOUNTER — Non-Acute Institutional Stay (SKILLED_NURSING_FACILITY): Payer: Medicare Other | Admitting: Internal Medicine

## 2017-02-19 DIAGNOSIS — K5909 Other constipation: Secondary | ICD-10-CM

## 2017-02-19 DIAGNOSIS — K219 Gastro-esophageal reflux disease without esophagitis: Secondary | ICD-10-CM | POA: Diagnosis not present

## 2017-02-19 DIAGNOSIS — E785 Hyperlipidemia, unspecified: Secondary | ICD-10-CM | POA: Diagnosis not present

## 2017-02-20 NOTE — Progress Notes (Signed)
[facility]  Provider: Blanchie Serve MD   Location:  [place] Room Number: 240 XBDZH of Service:  SNF (31)  PCP: Blanchie Serve, MD Patient Care Team: Blanchie Serve, MD as PCP - General (Internal Medicine) Mast, Man X, NP as Nurse Practitioner (Internal Medicine) Juluis Rainier as Consulting Physician (Optometry) Monna Fam, MD as Consulting Physician (Ophthalmology)  Extended Emergency Contact Information Primary Emergency Contact: [facility] Address: [address], [place] [address] [name] of [place] Phone: [phone] Work Phone: [phone] Mobile Phone: [phone] Relation: Daughter  Code Status: DNR  Goals of Care: Advanced Directive information Advanced Directives 02/19/2017  Does Patient Have a Medical Advance Directive? Yes  Type of Paramedic of [place];Living will;Out of facility DNR (pink MOST or yellow form)  Does patient want to make changes to medical advance directive? No - Patient declined  Copy of [place] in Chart? Yes  Pre-existing out of facility DNR order (yellow form or pink MOST form) Yellow form placed in chart (order not valid for inpatient use)      Chief Complaint  Patient presents with  . Medical Management of Chronic Issues    Routine Visit    HPI: Patient is a 84 y.o. female seen today for routine visit. She is seen with her daughter present. She denies any concern. Limited HPI and ROS with her dementia. No acute concern from daughter. Recently with concern for increased neck swelling pt was seen by NP and ultrasound of neck and labs were ordered. Results within normal range per NP, no result available for review this visit. Patient denies trouble swallowing or breathing. No findings suggestive of dysphagia or dyspnea noted by nursing.   Past Medical History:  Diagnosis Date  . Acute  encephalopathy   . Aphasia following unspecified cerebrovascular disease   . 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 Depression   . Dysphagia following cerebrovascular disease   . Hepatic steatosis   . Hydroureteronephrosis   . Hypertension   . Insomnia   . Macular degeneration   . Major depressive disorder, recurrent ([place])   . Neuralgia and neuritis, unspecified (CODE)   . Nontraumatic intracranial hemorrhage ([place])   . Stroke ([place])   . 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   . 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 ABDOMINAL HYSTERECTOMY    . APPENDECTOMY    . BREAST SURGERY    . MASTECTOMY Right     reports that  has never smoked. she has never used smokeless tobacco. She reports that she does not drink alcohol or use drugs. Social History   Socioeconomic History  . Marital status: Widowed    Spouse name: Not on file  . Number of children: Not on file  . Years of education: Not on file  . Highest education level: Not on file  Social Needs  . Financial resource strain: Not on file  . Food insecurity - worry: Not on file  . Food insecurity - inability: Not on file  . Transportation needs - medical: Not on file  . Transportation needs - non-medical: Not on file  Occupational History  . Not on file  Tobacco Use  . Smoking status: Never Smoker  . Smokeless tobacco: Never  Used  Substance and Sexual Activity  . Alcohol use: No  . Drug use: No  . Sexual activity: No  Other Topics Concern  . Not on file  Social History Narrative   Lives at [facility], [place] since 04/25/2013   Widowed   Never smoked   Alcohol none   DNR, POA, Living Will    Functional Status Survey:    History reviewed. No pertinent family history.  Health Maintenance  Topic Date Due  . DEXA SCAN  07/18/1994  . PNA vac Low Risk Adult (1 of 2 - PCV13) 07/18/1994    . INFLUENZA VACCINE  08/06/2016  . TETANUS/TDAP  01/06/2018 (Originally 07/17/1946)    Allergies  Allergen Reactions  . Hydrocodone Nausea And Vomiting  . Morphine And Related Nausea And Vomiting  . Actonel [Risedronate Sodium] Other (See Comments)    unknown  . Darvon [Propoxyphene Hcl] Other (See Comments)    unknown  . Oxycodone Hcl Other (See Comments)    unknown  . Pneumovax [Pneumococcal Polysaccharide Vaccine] Other (See Comments)    unknown    Outpatient Encounter Medications as of 02/19/2017  Medication Sig  . acetaminophen (TYLENOL) 325 MG tablet Take 650 mg by mouth every 4 (four) hours as needed for pain.  Marland Kitchen atorvastatin (LIPITOR) 10 MG tablet Take 10 mg by mouth daily.  . bisacodyl (DULCOLAX) 10 MG suppository Place 1 suppository (10 mg total) rectally daily as needed for moderate constipation.  . fluticasone (FLONASE) 50 MCG/ACT nasal spray Place 1 spray into both nostrils as needed for allergies.   . hydrocortisone 2.5 % cream Apply 1 application topically 2 (two) times daily as needed. Apply to affected area on face  . metoprolol tartrate (LOPRESSOR) 25 MG tablet Take 12.5 mg by mouth 2 (two) times daily.   . mirtazapine (REMERON) 7.5 MG tablet Take 7.5 mg by mouth at bedtime.  Marland Kitchen nystatin (NYSTATIN) powder Apply topically daily as needed. Under left breast   . omeprazole (PRILOSEC) 20 MG capsule Take 10 mg by mouth daily.   . polyethylene glycol (MIRALAX / GLYCOLAX) packet Take 17 g by mouth daily.  . risperiDONE (RISPERDAL) 0.25 MG tablet Take 0.25 mg by mouth 2 (two) times daily.  . Rivaroxaban (XARELTO) 15 MG TABS tablet Take 1 tablet (15 mg total) by mouth daily at 6 PM.  . sertraline (ZOLOFT) 100 MG tablet Take 1 tablet (100 mg total) by mouth daily at 6 PM.  . Simethicone (GAS RELIEF 80 PO) Take 80 mg by mouth 3 (three) times daily with meals.   . [DISCONTINUED] mirtazapine (REMERON) 15 MG tablet Take 1 tablet (15 mg total) by mouth at bedtime.   No  facility-administered encounter medications on file as of 02/19/2017.     Review of Systems  Unable to perform ROS: Dementia    Vitals:   02/19/17 1226  BP: 118/60  Pulse: 72  Resp: 16  Temp: (!) 97.2 F (36.2 C)  TempSrc: Oral  SpO2: 96%  Weight: 164 lb 3.2 oz (74.5 kg)  Height: 5\' 3"  (1.6 m)   Body mass index is 29.09 kg/m.   Wt Readings from Last 3 Encounters:  02/19/17 164 lb 3.2 oz (74.5 kg)  02/11/17 164 lb 3.2 oz (74.5 kg)  01/09/17 162 lb 12.8 oz (73.8 kg)   Physical Exam  Constitutional: No distress.  Overweight, elderly  HENT:  Head: Normocephalic and atraumatic.  Mouth/Throat: Oropharynx is clear and moist.  Broad and short neck with folds, no  palpable mass noted  Eyes: Conjunctivae and EOM are normal. Pupils are equal, round, and reactive to light. Right eye exhibits no discharge. Left eye exhibits no discharge.  Neck: Normal range of motion. Neck supple.  Cardiovascular: Normal rate and regular rhythm.  Pulmonary/Chest: Effort normal and breath sounds normal. She has no wheezes. She has no rales.  Abdominal: Soft. Bowel sounds are normal. There is no tenderness. There is no rebound.  Musculoskeletal: She exhibits edema and deformity.  Unsteady gait, uses walker  Lymphadenopathy:    She has no cervical adenopathy.  Neurological: She is alert.  Oriented only to self  Skin: Skin is warm and dry. No rash noted. She is not diaphoretic.  Psychiatric: She has a normal mood and affect.    Labs reviewed: Basic Metabolic Panel: Recent Labs    07/13/16 1420 07/14/16 0638 07/15/16 0409 07/17/16 0627 07/24/16 08/13/16 12/17/16  NA 140 142 138 139 142 137 139  K 3.5 3.7 3.3* 3.8 3.6 3.7 3.6  CL 109 111 107 108  --   --   --   CO2 23 23 23 26   --   --   --   GLUCOSE 113* 94 88 97  --   --   --   BUN 10 7 9 10 11 14 12   CREATININE 0.99 1.01* 1.04* 0.91 1.1 1.0 1.0  CALCIUM 9.2 8.8* 9.0 9.0  --   --   --   MG 2.4  --   --   --   --   --   --   PHOS 2.3*   --   --   --   --   --   --    Liver Function Tests: Recent Labs    07/13/16 1420 07/24/16 08/13/16 12/17/16  AST 25 15 17 17   ALT 16 13 16 14   ALKPHOS 110 92 108 101  BILITOT 0.8  --   --   --   PROT 6.8  --   --   --   ALBUMIN 4.2  --   --   --    No results for input(s): LIPASE, AMYLASE in the last 8760 hours. No results for input(s): AMMONIA in the last 8760 hours. CBC: Recent Labs    07/13/16 1420  07/14/16 0638 07/15/16 0409  08/13/16 12/17/16 02/12/17  WBC 8.2  --  5.8 6.5   < > 6.8 7.6 5.6  NEUTROABS 5.7  --   --   --   --   --   --   --   HGB 14.4  --  13.2 13.3   < > 13.5 12.8 13.6  HCT 43.9   < > 41.3 41.3   < > 41 38 40  MCV 94.2  --  97.9 95.6  --   --   --   --   PLT 213  --  210 191   < > 264 217 251   < > = values in this interval not displayed.   Cardiac Enzymes: No results for input(s): CKTOTAL, CKMB, CKMBINDEX, TROPONINI in the last 8760 hours. BNP: Invalid input(s): POCBNP Lab Results  Component Value Date   HGBA1C 5.5 12/02/2016   Lab Results  Component Value Date   TSH 1.56 02/12/2017   Lab Results  Component Value Date   VITAMINB12 192 07/14/2016   No results found for: FOLATE No results found for: IRON, TIBC, FERRITIN  Lipid Panel: Recent Labs    10/21/16  CHOL  174  HDL 45  LDLCALC 103  TRIG 158   Lab Results  Component Value Date   HGBA1C 5.5 12/02/2016    Procedures since last visit: No results found.  Assessment/Plan  gerd Stable, continue omeprazole 10 mg daily  Hyperlipidemia St[place]ke, continue atorvastatin 10 mg daily for now  Chronic constipation Continue mjiralax for now and monitor  Labs/tests ordered: none  Communication: reviewed care plan with patient and charge nurse.    Blanchie Serve, MD Internal Medicine [facility] [address] [place], [place] [address] Cell Phone (Monday-Friday 8 am - 5 pm): [phone] On Call: [phone] and follow prompts after 5 pm  and on weekends Office Phone: [phone] Office Fax: [phone]

## 2017-03-18 ENCOUNTER — Encounter: Payer: Self-pay | Admitting: Nurse Practitioner

## 2017-03-18 ENCOUNTER — Non-Acute Institutional Stay (SKILLED_NURSING_FACILITY): Payer: Medicare Other | Admitting: Nurse Practitioner

## 2017-03-18 DIAGNOSIS — F29 Unspecified psychosis not due to a substance or known physiological condition: Secondary | ICD-10-CM | POA: Diagnosis not present

## 2017-03-18 DIAGNOSIS — K219 Gastro-esophageal reflux disease without esophagitis: Secondary | ICD-10-CM

## 2017-03-18 DIAGNOSIS — F329 Major depressive disorder, single episode, unspecified: Secondary | ICD-10-CM | POA: Diagnosis not present

## 2017-03-18 DIAGNOSIS — Z7901 Long term (current) use of anticoagulants: Secondary | ICD-10-CM | POA: Diagnosis not present

## 2017-03-18 DIAGNOSIS — I10 Essential (primary) hypertension: Secondary | ICD-10-CM

## 2017-03-18 DIAGNOSIS — F0151 Vascular dementia with behavioral disturbance: Secondary | ICD-10-CM

## 2017-03-18 DIAGNOSIS — F01518 Vascular dementia, unspecified severity, with other behavioral disturbance: Secondary | ICD-10-CM

## 2017-03-18 DIAGNOSIS — Z7189 Other specified counseling: Secondary | ICD-10-CM | POA: Diagnosis not present

## 2017-03-18 DIAGNOSIS — K5909 Other constipation: Secondary | ICD-10-CM | POA: Diagnosis not present

## 2017-03-18 DIAGNOSIS — F0280 Dementia in other diseases classified elsewhere without behavioral disturbance: Secondary | ICD-10-CM | POA: Diagnosis not present

## 2017-03-18 DIAGNOSIS — F0393 Unspecified dementia, unspecified severity, with mood disturbance: Secondary | ICD-10-CM

## 2017-03-18 NOTE — Progress Notes (Signed)
Location:  [place] Room Number: [place]:  SNF (31) Provider:  Krist Rosenboom, Manxie  NP  Blanchie Serve, MD  Patient Care Team: Blanchie Serve, MD as PCP - General (Internal Medicine) Lillianne Eick X, NP as Nurse Practitioner (Internal Medicine) Juluis Rainier as Consulting Physician (Optometry) Monna Fam, MD as Consulting Physician (Ophthalmology)  Extended Emergency Contact Information Primary Emergency Contact: [facility] Address: [address], [place] [address] [name] of [place] Phone: [phone] Work Phone: [phone] Mobile Phone: [phone] Relation: Daughter  Code Status:  DNR Goals of care: Advanced Directive information Advanced Directives 03/18/2017  Does Patient Have a Medical Advance Directive? Yes  Type of Paramedic of [place];Living will;Out of facility DNR (pink MOST or yellow form)  Does patient want to make changes to medical advance directive? No - Patient declined  Copy of [place] in Chart? Yes  Pre-existing out of facility DNR order (yellow form or pink MOST form) Yellow form placed in chart (order not valid for inpatient use)     Chief Complaint  Patient presents with  . Medical Management of Chronic Issues    F/U- GERD, constipation    HPI:  Pt is a 84 y.o. female seen today for medical management of chronic diseases.  Also the patient's POA daughter Nialah Saravia present for goals of care discussion. She has history of dementia, resides in [facility], [facility], ambulates with walker. Hx of depression and anxiety, she is less smiling, refusal of meds and personal assistance, anxious when she is asked to participate activities on unit, on Sertraline 100mg , Mirtazapine 7.5mg  qd. Her psychosis is stable on Risperdal. Taking Xarelto for protein C/S deficiency, s/p CVA and DVT. No constipation while on MiraLax daily. GERD stable  on Omeprazole 10mg  daily. Her blood pressure is controlled on Metoprolol 12.5mg  bid.                       Past Medical History:  Diagnosis Date  . Acute encephalopathy   . Aphasia following unspecified cerebrovascular disease   . 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 Depression   . Dysphagia following cerebrovascular disease   . Hepatic steatosis   . Hydroureteronephrosis   . Hypertension   . Insomnia   . Macular degeneration   . Major depressive disorder, recurrent ([place])   . Neuralgia and neuritis, unspecified ([place])   . Nontraumatic intracranial hemorrhage ([place])   . Stroke ([place])   . 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   . 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 ABDOMINAL HYSTERECTOMY    . APPENDECTOMY    . BREAST SURGERY    . MASTECTOMY Right     Allergies  Allergen Reactions  . Hydrocodone Nausea And Vomiting  . Morphine And Related Nausea And Vomiting  . Actonel [Risedronate Sodium] Other (See Comments)    unknown  . Darvon [Propoxyphene Hcl] Other (See Comments)    unknown  . Oxycodone Hcl Other (See Comments)    unknown  . Pneumovax [Pneumococcal Polysaccharide Vaccine] Other (See Comments)    unknown    Outpatient Encounter Medications as of 03/18/2017  Medication Sig  . acetaminophen (TYLENOL) 325 MG tablet Take 650 mg by mouth every 4 (four) hours  as needed for pain.  Marland Kitchen atorvastatin (LIPITOR) 10 MG tablet Take 10 mg by mouth daily.  . bisacodyl (DULCOLAX) 10 MG suppository Place 1 suppository (10 mg total) rectally daily as needed for moderate constipation.  . fluticasone (FLONASE) 50 MCG/ACT nasal spray Place 1 spray into both nostrils as needed for allergies.   . hydrocortisone 2.5 % cream Apply 1 application topically 2 (two) times daily as needed. Apply to affected area on face  . metoprolol tartrate (LOPRESSOR) 25 MG tablet  Take 12.5 mg by mouth 2 (two) times daily.   . mirtazapine (REMERON) 7.5 MG tablet Take 7.5 mg by mouth at bedtime.  Marland Kitchen nystatin (NYSTATIN) powder Apply topically daily as needed. Under left breast   . omeprazole (PRILOSEC) 20 MG capsule Take 10 mg by mouth daily.   . polyethylene glycol (MIRALAX / GLYCOLAX) packet Take 17 g by mouth daily.  . risperiDONE (RISPERDAL) 0.25 MG tablet Take 0.25 mg by mouth 2 (two) times daily.  . Rivaroxaban (XARELTO) 15 MG TABS tablet Take 1 tablet (15 mg total) by mouth daily at 6 PM.  . sertraline (ZOLOFT) 100 MG tablet Take 1 tablet (100 mg total) by mouth daily at 6 PM.  . Simethicone (GAS RELIEF 80 PO) Take 80 mg by mouth 3 (three) times daily with meals.    No facility-administered encounter medications on file as of 03/18/2017.    ROS was provided with assistance of staff Review of Systems  Constitutional: Negative for activity change, appetite change, chills, diaphoresis, fatigue and fever.  HENT: Positive for hearing loss. Negative for congestion, trouble swallowing and voice change.   Eyes: Negative for visual disturbance.  Respiratory: Negative for cough, choking, chest tightness, shortness of breath and wheezing.   Cardiovascular: Negative for chest pain, palpitations and leg swelling.  Gastrointestinal: Negative for abdominal distention, abdominal pain, constipation, nausea and vomiting.  Genitourinary: Negative for difficulty urinating, dysuria and urgency.  Musculoskeletal: Positive for back pain. Negative for gait problem.  Skin: Negative for color change and pallor.  Neurological: Positive for speech difficulty. Negative for dizziness, weakness and headaches.       Expressive aphasia.   Psychiatric/Behavioral: Positive for agitation, behavioral problems and confusion. Negative for hallucinations and sleep disturbance. The patient is nervous/anxious.        Medication refusal, personal assistance refusal, less smile than usual, anxious when  asked to participate activities on unit, irritable at times.     Immunization History  Administered Date(s) Administered  . Influenza-Unspecified 11/20/2013, 09/26/2014, 10/24/2015   Pertinent  Health Maintenance Due  Topic Date Due  . DEXA SCAN  07/18/1994  . PNA vac Low Risk Adult (1 of 2 - PCV13) 07/18/1994  . INFLUENZA VACCINE  08/06/2017   Fall Risk  09/05/2016  Falls in the past year? No   Functional Status Survey:    Vitals:   03/18/17 1234  BP: (!) 112/58  Pulse: 62  Weight: 167 lb (75.8 kg)  Height: 5\' 3"  (1.6 m)   Body mass index is 29.58 kg/m. Physical Exam  Constitutional: She appears well-developed and well-nourished. No distress.  HENT:  Head: Normocephalic and atraumatic.  Eyes: Conjunctivae and EOM are normal. Pupils are equal, round, and reactive to light.  Neck: Normal range of motion. Neck supple. No JVD present. No thyromegaly present.  Cardiovascular: Normal rate, regular rhythm and normal heart sounds.  No murmur heard. Pulmonary/Chest: Effort normal and breath sounds normal. She has no wheezes. She has no rales.  Abdominal: Soft.  Bowel sounds are normal. She exhibits no distension. There is no tenderness.  Musculoskeletal: She exhibits no edema or tenderness.  Ambulates with walker. Slightly weaker on right side since stroke.   Neurological: She is alert. She exhibits normal muscle tone. Coordination normal.  Oriented to self   Skin: Skin is warm and dry. She is not diaphoretic.  Psychiatric:  Less smiling, irritable at times, anxious when asked to participate activities on unit.     Labs reviewed: Recent Labs    07/13/16 1420 07/14/16 0638 07/15/16 0409 07/17/16 0627 07/24/16 08/13/16 12/17/16  NA 140 142 138 139 142 137 139  K 3.5 3.7 3.3* 3.8 3.6 3.7 3.6  CL 109 111 107 108  --   --   --   CO2 23 23 23 26   --   --   --   GLUCOSE 113* 94 88 97  --   --   --   BUN 10 7 9 10 11 14 12   CREATININE 0.99 1.01* 1.04* 0.91 1.1 1.0 1.0    CALCIUM 9.2 8.8* 9.0 9.0  --   --   --   MG 2.4  --   --   --   --   --   --   PHOS 2.3*  --   --   --   --   --   --    Recent Labs    07/13/16 1420 07/24/16 08/13/16 12/17/16  AST 25 15 17 17   ALT 16 13 16 14   ALKPHOS 110 92 108 101  BILITOT 0.8  --   --   --   PROT 6.8  --   --   --   ALBUMIN 4.2  --   --   --    Recent Labs    07/13/16 1420  07/14/16 0638 07/15/16 0409  08/13/16 12/17/16 02/12/17  WBC 8.2  --  5.8 6.5   < > 6.8 7.6 5.6  NEUTROABS 5.7  --   --   --   --   --   --   --   HGB 14.4  --  13.2 13.3   < > 13.5 12.8 13.6  HCT 43.9   < > 41.3 41.3   < > 41 38 40  MCV 94.2  --  97.9 95.6  --   --   --   --   PLT 213  --  210 191   < > 264 217 251   < > = values in this interval not displayed.   Lab Results  Component Value Date   TSH 1.56 02/12/2017   Lab Results  Component Value Date   HGBA1C 5.5 12/02/2016   Lab Results  Component Value Date   CHOL 174 10/21/2016   HDL 45 10/21/2016   LDLCALC 103 10/21/2016   TRIG 158 10/21/2016    Significant Diagnostic Results in last 30 days:  No results found.  Assessment/Plan Vascular dementia with behavior disturbance She has history of dementia, resides in [facility], [facility], ambulates with walker.  Depression due to dementia  Hx of depression and anxiety, she is less smiling, refusal of meds and personal assistance, anxious when she is asked to participate activities on unit, will increase Sertraline 125mg , continue Mirtazapine 7.5mg  qd. Observe for effectiveness and possible side effects.   Psychosis Her psychosis is stable, continue Risperdal.    Chronic anticoagulation Continue Xarelto for protein C/S deficiency, s/p CVA and DVT.  Chronic constipation No constipation, continue MiraLax daily.   GERD (gastroesophageal reflux disease) GERD stable, continue Omeprazole 10mg  daily. .   Essential hypertension, benign Her blood pressure is controlled, continue Metoprolol 12.5mg  bid  Advance  care planning Goals of care discussion: reviewed goals of care with the patient's POA daughter Telesha Deguzman, social worker present. DNR form updated. Went over and filled out MOSE for between 2:10pm to 2:35pm. The patient would like to be DNR when patient has no pulse and is not breathing. The patient's daughter POA Ryin Schillo desires to discuss  MOST form with her siblings further, then make informed decision regarding the patient's plan of advanced care.     Family/ staff Communication: plan of care reviewed with the patient, the patient's POA Delsa Grana, Education officer, museum, and Camera operator.   Labs/tests ordered:  None  Time spend 25 minutes

## 2017-03-19 DIAGNOSIS — Z7189 Other specified counseling: Secondary | ICD-10-CM | POA: Insufficient documentation

## 2017-03-19 NOTE — Assessment & Plan Note (Signed)
She has history of dementia, resides in [facility], [facility], ambulates with walker.

## 2017-03-19 NOTE — Assessment & Plan Note (Signed)
Her psychosis is stable, continue Risperdal.

## 2017-03-19 NOTE — Assessment & Plan Note (Signed)
No constipation, continue MiraLax daily.  

## 2017-03-19 NOTE — Assessment & Plan Note (Signed)
Her blood pressure is controlled, continue Metoprolol 12.5mg bid 

## 2017-03-19 NOTE — Assessment & Plan Note (Signed)
Hx of depression and anxiety, she is less smiling, refusal of meds and personal assistance, anxious when she is asked to participate activities on unit, will increase Sertraline 125mg , continue Mirtazapine 7.5mg  qd. Observe for effectiveness and possible side effects.

## 2017-03-19 NOTE — Assessment & Plan Note (Addendum)
Goals of care discussion: reviewed goals of care with the patient's POA daughter Simmie Garin, social worker present. DNR form updated. Went over and filled out MOSE for between 2:10pm to 2:35pm. The patient would like to be DNR when patient has no pulse and is not breathing. The patient's daughter POA Adamarie Izzo desires to discuss  MOST form with her siblings further, then make informed decision regarding the patient's plan of advanced care.

## 2017-03-19 NOTE — Assessment & Plan Note (Signed)
Continue Xarelto for protein C/S deficiency, s/p CVA and DVT.

## 2017-03-19 NOTE — Assessment & Plan Note (Signed)
GERD stable, continue Omeprazole 10mg daily.  

## 2017-04-15 ENCOUNTER — Non-Acute Institutional Stay (SKILLED_NURSING_FACILITY): Payer: Medicare Other | Admitting: Nurse Practitioner

## 2017-04-15 ENCOUNTER — Encounter: Payer: Self-pay | Admitting: Nurse Practitioner

## 2017-04-15 DIAGNOSIS — R443 Hallucinations, unspecified: Secondary | ICD-10-CM

## 2017-04-15 DIAGNOSIS — I10 Essential (primary) hypertension: Secondary | ICD-10-CM | POA: Diagnosis not present

## 2017-04-15 DIAGNOSIS — F0151 Vascular dementia with behavioral disturbance: Secondary | ICD-10-CM

## 2017-04-15 DIAGNOSIS — I639 Cerebral infarction, unspecified: Secondary | ICD-10-CM

## 2017-04-15 DIAGNOSIS — F0393 Unspecified dementia, unspecified severity, with mood disturbance: Secondary | ICD-10-CM

## 2017-04-15 DIAGNOSIS — F329 Major depressive disorder, single episode, unspecified: Secondary | ICD-10-CM

## 2017-04-15 DIAGNOSIS — F0280 Dementia in other diseases classified elsewhere without behavioral disturbance: Secondary | ICD-10-CM

## 2017-04-15 DIAGNOSIS — F01518 Vascular dementia, unspecified severity, with other behavioral disturbance: Secondary | ICD-10-CM

## 2017-04-15 NOTE — Assessment & Plan Note (Signed)
Visual hallucination is stable, continue Risperdal 0.25mg  bid.

## 2017-04-15 NOTE — Progress Notes (Signed)
Location:  [place] Room Number: [place]:  SNF (31) Provider:  Kynadie Yaun, Manxie  NP  Blanchie Serve, MD  Patient Care Team: Blanchie Serve, MD as PCP - General (Internal Medicine) Bartosz Luginbill X, NP as Nurse Practitioner (Internal Medicine) Juluis Rainier as Consulting Physician (Optometry) Monna Fam, MD as Consulting Physician (Ophthalmology)  Extended Emergency Contact Information Primary Emergency Contact: [facility] Address: [address], [place] [address] [name] of [place] Phone: [phone] Work Phone: [phone] Mobile Phone: [phone] Relation: Daughter  Code Status:  DNR Goals of care: Advanced Directive information Advanced Directives 04/15/2017  Does Patient Have a Medical Advance Directive? Yes  Type of Paramedic of [place];Living will;Out of facility DNR (pink MOST or yellow form)  Does patient want to make changes to medical advance directive? No - Patient declined  Copy of [place] in Chart? Yes  Pre-existing out of facility DNR order (yellow form or pink MOST form) Yellow form placed in chart (order not valid for inpatient use)     Chief Complaint  Patient presents with  . Medical Management of Chronic Issues    F/u - vascular dementia, depression,    HPI:  Pt is a 84 y.o. female seen today for medical management of chronic diseases.     The patient has history of dementia, resides in Memory care unit, ambulates with walker. Her mood is stable on Sertraline 125mg  qd, Mirtazapine 7.5mg  nightly. Visual hallucination is stable on Risperdal 0.25mg  bid. Her blood pressure is controlled on Metoprolol 12.5mg  bid, on Atorvastatin 10mg  qd for cardiovascular risk reduction. Hx of CVA, protein C/S deficiency, on Xarelto 15mg  daily for long term.  Past Medical History:  Diagnosis Date  . Acute encephalopathy   . Aphasia following  unspecified cerebrovascular disease   . 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 Depression   . Dysphagia following cerebrovascular disease   . Hepatic steatosis   . Hydroureteronephrosis   . Hypertension   . Insomnia   . Macular degeneration   . Major depressive disorder, recurrent ([place])   . Neuralgia and neuritis, unspecified ([place])   . Nontraumatic intracranial hemorrhage ([place])   . Stroke ([place])   . 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   . 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 ABDOMINAL HYSTERECTOMY    . APPENDECTOMY    . BREAST SURGERY    . MASTECTOMY Right     Allergies  Allergen Reactions  . Hydrocodone Nausea And Vomiting  . Morphine And Related Nausea And Vomiting  . Actonel [Risedronate Sodium] Other (See Comments)    unknown  . Darvon [Propoxyphene Hcl] Other (See Comments)    unknown  . Oxycodone Hcl Other (See Comments)    unknown  . Pneumovax [Pneumococcal Polysaccharide Vaccine] Other (See Comments)    unknown    Outpatient Encounter Medications as of 04/15/2017  Medication Sig  . acetaminophen (TYLENOL) 325 MG tablet Take 650 mg by mouth every 4 (four) hours as needed for pain.  Marland Kitchen atorvastatin (LIPITOR) 10 MG tablet Take 10 mg by mouth daily.  . bisacodyl (DULCOLAX) 10 MG suppository Place 1 suppository (10 mg total) rectally daily as needed for moderate constipation.  . fluticasone (FLONASE) 50 MCG/ACT nasal spray Place 1 spray  into both nostrils as needed for allergies.   . hydrocortisone 2.5 % cream Apply 1 application topically 2 (two) times daily as needed. Apply to affected area on face  . metoprolol tartrate (LOPRESSOR) 25 MG tablet Take 12.5 mg by mouth 2 (two) times daily.   . mirtazapine (REMERON) 7.5 MG tablet Take 7.5 mg by mouth at bedtime.  Marland Kitchen nystatin (NYSTATIN) powder Apply topically daily as needed. Under left breast     . omeprazole (PRILOSEC) 20 MG capsule Take 10 mg by mouth daily.   . polyethylene glycol (MIRALAX / GLYCOLAX) packet Take 17 g by mouth daily.  . risperiDONE (RISPERDAL) 0.25 MG tablet Take 0.25 mg by mouth 2 (two) times daily.  . Rivaroxaban (XARELTO) 15 MG TABS tablet Take 1 tablet (15 mg total) by mouth daily at 6 PM.  . sertraline (ZOLOFT) 100 MG tablet Take 1 tablet (100 mg total) by mouth daily at 6 PM.  . sertraline (ZOLOFT) 25 MG tablet Take 25 mg by mouth daily. Take along with 100 mg tablet to equal 125 mg.  . Simethicone (GAS RELIEF 80 PO) Take 80 mg by mouth 3 (three) times daily with meals.    No facility-administered encounter medications on file as of 04/15/2017.    ROS was provided with assistance of staff Review of Systems  Constitutional: Negative for activity change, appetite change, diaphoresis, fatigue and fever.  HENT: Positive for hearing loss. Negative for congestion, trouble swallowing and voice change.   Eyes: Negative for visual disturbance.  Respiratory: Negative for cough, shortness of breath and wheezing.   Cardiovascular: Negative for chest pain, palpitations and leg swelling.  Gastrointestinal: Negative for abdominal distention, constipation, diarrhea and nausea.  Genitourinary: Negative for difficulty urinating, dysuria and urgency.  Musculoskeletal: Positive for gait problem.  Skin: Negative for color change and pallor.  Neurological: Positive for speech difficulty. Negative for dizziness, weakness and headaches.       Expressive aphasia.   Psychiatric/Behavioral: Positive for confusion. Negative for agitation, behavioral problems, hallucinations and sleep disturbance. The patient is not nervous/anxious.     Immunization History  Administered Date(s) Administered  . Influenza-Unspecified 11/20/2013, 09/26/2014, 10/24/2015   Pertinent  Health Maintenance Due  Topic Date Due  . DEXA SCAN  07/18/1994  . PNA vac Low Risk Adult (1 of 2 - PCV13)  07/18/1994  . INFLUENZA VACCINE  08/06/2017   Fall Risk  09/05/2016  Falls in the past year? No   Functional Status Survey:    Vitals:   04/15/17 1053  BP: 120/62  Pulse: 72  Resp: 20  Weight: 167 lb 6.4 oz (75.9 kg)  Height: 5\' 3"  (1.6 m)   Body mass index is 29.65 kg/m. Physical Exam  Constitutional: She appears well-developed and well-nourished.  HENT:  Head: Normocephalic and atraumatic.  Eyes: Pupils are equal, round, and reactive to light. EOM are normal.  Neck: Normal range of motion. Neck supple. No JVD present. No thyromegaly present.  Cardiovascular: Normal rate and regular rhythm.  No murmur heard. Pulmonary/Chest: Effort normal and breath sounds normal. She has no wheezes. She has no rales.  Abdominal: Soft. She exhibits no distension. There is no tenderness. There is no guarding.  Musculoskeletal: She exhibits no edema or tenderness.  Ambulates with walker.   Neurological: No cranial nerve deficit. She exhibits normal muscle tone. Coordination normal.  Skin: Skin is warm and dry.  S/p right mastectomy  Psychiatric: She has a normal mood and affect.    Labs reviewed: Recent  Labs    07/13/16 1420 07/14/16 0638 07/15/16 0409 07/17/16 0627 07/24/16 08/13/16 12/17/16  NA 140 142 138 139 142 137 139  K 3.5 3.7 3.3* 3.8 3.6 3.7 3.6  CL 109 111 107 108  --   --   --   CO2 23 23 23 26   --   --   --   GLUCOSE 113* 94 88 97  --   --   --   BUN 10 7 9 10 11 14 12   CREATININE 0.99 1.01* 1.04* 0.91 1.1 1.0 1.0  CALCIUM 9.2 8.8* 9.0 9.0  --   --   --   MG 2.4  --   --   --   --   --   --   PHOS 2.3*  --   --   --   --   --   --    Recent Labs    07/13/16 1420 07/24/16 08/13/16 12/17/16  AST 25 15 17 17   ALT 16 13 16 14   ALKPHOS 110 92 108 101  BILITOT 0.8  --   --   --   PROT 6.8  --   --   --   ALBUMIN 4.2  --   --   --    Recent Labs    07/13/16 1420  07/14/16 0638 07/15/16 0409  08/13/16 12/17/16 02/12/17  WBC 8.2  --  5.8 6.5   < > 6.8 7.6 5.6   NEUTROABS 5.7  --   --   --   --   --   --   --   HGB 14.4  --  13.2 13.3   < > 13.5 12.8 13.6  HCT 43.9   < > 41.3 41.3   < > 41 38 40  MCV 94.2  --  97.9 95.6  --   --   --   --   PLT 213  --  210 191   < > 264 217 251   < > = values in this interval not displayed.   Lab Results  Component Value Date   TSH 1.56 02/12/2017   Lab Results  Component Value Date   HGBA1C 5.5 12/02/2016   Lab Results  Component Value Date   CHOL 174 10/21/2016   HDL 45 10/21/2016   LDLCALC 103 10/21/2016   TRIG 158 10/21/2016    Significant Diagnostic Results in last 30 days:  No results found.  Assessment/Plan CVA (cerebral vascular accident) Hx of CVA, protein C/S deficiency, on Xarelto 15mg  daily for long term. Left parietal and occipital hemorrhagic stroke 11/26/11   Essential hypertension, benign Her blood pressure is controlled continue Metoprolol 12.5mg  bid, Atorvastatin 10mg  qd for cardiovascular risk reduction.   Vascular dementia with behavior disturbance  Resides in Memory care unit, ambulates with walker.   Depression due to dementia Her mood is stable, continue Sertraline 125mg  qd, Mirtazapine 7.5mg  nightly.   Hallucination Visual hallucination is stable, continue Risperdal 0.25mg  bid.   '   Family/ staff Communication: plan of care reviewed with the patient and charge nurse.   Labs/tests ordered: none  Time spend 25 minutes.

## 2017-04-15 NOTE — Assessment & Plan Note (Signed)
Hx of CVA, protein C/S deficiency, on Xarelto 15mg  daily for long term. Left parietal and occipital hemorrhagic stroke 11/26/11

## 2017-04-15 NOTE — Assessment & Plan Note (Signed)
Resides in Memory care unit, ambulates with walker.

## 2017-04-15 NOTE — Assessment & Plan Note (Signed)
Her mood is stable, continue Sertraline 125mg  qd, Mirtazapine 7.5mg  nightly.

## 2017-04-15 NOTE — Assessment & Plan Note (Signed)
Her blood pressure is controlled continue Metoprolol 12.5mg  bid, Atorvastatin 10mg  qd for cardiovascular risk reduction.

## 2017-05-12 ENCOUNTER — Encounter: Payer: Self-pay | Admitting: Internal Medicine

## 2017-05-12 ENCOUNTER — Non-Acute Institutional Stay (SKILLED_NURSING_FACILITY): Payer: Medicare Other | Admitting: Internal Medicine

## 2017-05-12 DIAGNOSIS — R2681 Unsteadiness on feet: Secondary | ICD-10-CM

## 2017-05-12 DIAGNOSIS — K219 Gastro-esophageal reflux disease without esophagitis: Secondary | ICD-10-CM

## 2017-05-12 DIAGNOSIS — K5909 Other constipation: Secondary | ICD-10-CM

## 2017-05-12 DIAGNOSIS — F01518 Vascular dementia, unspecified severity, with other behavioral disturbance: Secondary | ICD-10-CM

## 2017-05-12 DIAGNOSIS — F0151 Vascular dementia with behavioral disturbance: Secondary | ICD-10-CM | POA: Diagnosis not present

## 2017-05-12 DIAGNOSIS — Z8673 Personal history of transient ischemic attack (TIA), and cerebral infarction without residual deficits: Secondary | ICD-10-CM | POA: Diagnosis not present

## 2017-05-12 DIAGNOSIS — I10 Essential (primary) hypertension: Secondary | ICD-10-CM

## 2017-05-12 NOTE — Progress Notes (Signed)
Location:  [place] Room Number: [place]:  SNF [phone]) Provider:  Blanchie Serve MD  Blanchie Serve, MD  Patient Care Team: Blanchie Serve, MD as PCP - General (Internal Medicine) Mast, Man X, NP as Nurse Practitioner (Internal Medicine) Juluis Rainier as Consulting Physician (Optometry) Monna Fam, MD as Consulting Physician (Ophthalmology)  Extended Emergency Contact Information Primary Emergency Contact: [facility] Address: [address], [place] [address] [name] of [place] Phone: [phone] Work Phone: [phone] Mobile Phone: [phone] Relation: Daughter  Code Status:  DNR Goals of care: Advanced Directive information Advanced Directives 04/15/2017  Does Patient Have a Medical Advance Directive? Yes  Type of Paramedic of [place];Living will;Out of facility DNR (pink MOST or yellow form)  Does patient want to make changes to medical advance directive? No - Patient declined  Copy of [place] in Chart? Yes  Pre-existing out of facility DNR order (yellow form or pink MOST form) Yellow form placed in chart (order not valid for inpatient use)     Chief Complaint  Patient presents with  . Medical Management of Chronic Issues    routine visit    HPI:  Pt is a 84 y.o. female seen today for medical management of chronic diseases. She is in memory care unit. She is pleasantly confused. She is in no distress and denies any concern this visit. Limited HPI and ROS from patient. She had a fall on 05/08/17 with no apparent injury. She does have behavioral issue at times.   Chronic Constipation- regular bowel movement, takes miralax daily and simethicone tid with meals.   Hypertension- bp stable on review, takes metoprolol tartrate 12.5 mg bid  gerd- denies any symptom of heartburn or indigestion, taking omeprazole 10 mg daily.   Dementia with  behavioral disturbance- takes risperdal 0.25 mg bid with sertraline 125 mg daily and remeron 7.5 mg daily.   History of CVA- currently on b blocker, lipitor 10 mg daily and rivaroxaban 15 mg daily. Had a fall on 05/08/17   Past Medical History:  Diagnosis Date  . Acute encephalopathy   . Aphasia following unspecified cerebrovascular disease   . 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 Depression   . Dysphagia following cerebrovascular disease   . Hepatic steatosis   . Hydroureteronephrosis   . Hypertension   . Insomnia   . Macular degeneration   . Major depressive disorder, recurrent ([place])   . Neuralgia and neuritis, unspecified ([place])   . Nontraumatic intracranial hemorrhage ([place])   . Stroke ([place])   . 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   . 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 ABDOMINAL HYSTERECTOMY    . APPENDECTOMY    . BREAST SURGERY    . MASTECTOMY Right     Allergies  Allergen Reactions  . Hydrocodone Nausea And Vomiting  . Morphine And Related Nausea And Vomiting  . Actonel [Risedronate Sodium] Other (See Comments)    unknown  . Darvon [Propoxyphene Hcl] Other (See Comments)    unknown  . Oxycodone Hcl Other (See Comments)    unknown  . Pneumovax [Pneumococcal Polysaccharide Vaccine] Other (See Comments)    unknown    Outpatient Encounter Medications as of 05/12/2017  Medication Sig  . acetaminophen (TYLENOL)  325 MG tablet Take 650 mg by mouth every 4 (four) hours as needed for pain.  Marland Kitchen atorvastatin (LIPITOR) 10 MG tablet Take 10 mg by mouth daily.  . bisacodyl (DULCOLAX) 10 MG suppository Place 1 suppository (10 mg total) rectally daily as needed for moderate constipation.  . fluticasone (FLONASE) 50 MCG/ACT nasal spray Place 1 spray into both nostrils as needed for allergies.   . hydrocortisone 2.5 % cream Apply 1 application topically  2 (two) times daily as needed. Apply to affected area on face  . metoprolol tartrate (LOPRESSOR) 25 MG tablet Take 12.5 mg by mouth 2 (two) times daily.   . mirtazapine (REMERON) 7.5 MG tablet Take 7.5 mg by mouth at bedtime.  Marland Kitchen nystatin (NYSTATIN) powder Apply topically daily as needed. Under left breast   . omeprazole (PRILOSEC) 20 MG capsule Take 10 mg by mouth daily.   . polyethylene glycol (MIRALAX / GLYCOLAX) packet Take 17 g by mouth daily.  . risperiDONE (RISPERDAL) 0.25 MG tablet Take 0.25 mg by mouth 2 (two) times daily.  . Rivaroxaban (XARELTO) 15 MG TABS tablet Take 1 tablet (15 mg total) by mouth daily at 6 PM.  . sertraline (ZOLOFT) 100 MG tablet Take 1 tablet (100 mg total) by mouth daily at 6 PM.  . sertraline (ZOLOFT) 25 MG tablet Take 25 mg by mouth daily. Take along with 100 mg tablet to equal 125 mg.  . Simethicone (GAS RELIEF 80 PO) Take 80 mg by mouth 3 (three) times daily with meals.    No facility-administered encounter medications on file as of 05/12/2017.     Review of Systems  Unable to perform ROS: Dementia (limited due to)  Constitutional: Negative for appetite change, chills and fever.  HENT: Negative for congestion, ear pain, mouth sores and trouble swallowing.   Eyes: Positive for visual disturbance.  Respiratory: Negative for cough and shortness of breath.   Cardiovascular: Negative for chest pain and palpitations.  Gastrointestinal: Negative for abdominal pain, constipation, diarrhea, nausea and vomiting.  Genitourinary: Negative for dysuria.  Musculoskeletal: Positive for gait problem. Negative for back pain.       Uses a walker for ambulation, no fall reported  Skin: Negative for rash.  Neurological: Negative for dizziness, weakness and headaches.  Psychiatric/Behavioral: Positive for confusion.    Immunization History  Administered Date(s) Administered  . Influenza-Unspecified 11/20/2013, 09/26/2014, 10/24/2015   Pertinent  Health Maintenance Due    Topic Date Due  . DEXA SCAN  07/18/1994  . PNA vac Low Risk Adult (1 of 2 - PCV13) 07/18/1994  . INFLUENZA VACCINE  08/06/2017   Fall Risk  09/05/2016  Falls in the past year? No   Functional Status Survey:    Vitals:   05/12/17 1428  BP: 118/68  Pulse: 86  Resp: 16  Temp: (!) 97.4 F (36.3 C)  SpO2: 95%  Weight: 169 lb 9.6 oz (76.9 kg)  Height: 5\' 3"  (1.6 m)   Body mass index is 30.04 kg/m.   Wt Readings from Last 3 Encounters:  05/12/17 169 lb 9.6 oz (76.9 kg)  04/15/17 167 lb 6.4 oz (75.9 kg)  03/18/17 167 lb (75.8 kg)   Physical Exam  Constitutional: She appears well-developed. No distress.  obese  HENT:  Head: Normocephalic and atraumatic.  Right Ear: External ear normal.  Left Ear: External ear normal.  Nose: Nose normal.  Mouth/Throat: Oropharynx is clear and moist.  Eyes: Pupils are equal, round, and reactive to light. Conjunctivae and EOM are  normal. Right eye exhibits no discharge. Left eye exhibits no discharge.  Has corrective glasses  Neck: Normal range of motion. Neck supple.  Cardiovascular: Normal rate and regular rhythm.  Pulmonary/Chest: Effort normal and breath sounds normal. No respiratory distress. She has no wheezes. She has no rales.  S/p right mastectomy  Abdominal: Soft. Bowel sounds are normal. She exhibits no mass. There is no tenderness.  Musculoskeletal: Normal range of motion.  Able to move all 4 extremities, trace leg edema, uses walker for ambulation, unsteady gait  Lymphadenopathy:    She has no cervical adenopathy.  Neurological: She is alert.  Oriented only to self  Skin: Skin is warm and dry. Capillary refill takes less than 2 seconds. She is not diaphoretic. No erythema.  Psychiatric:  Pleasantly confused    Labs reviewed: Recent Labs    07/13/16 1420 07/14/16 0638 07/15/16 0409 07/17/16 0627 07/24/16 08/13/16 12/17/16  NA 140 142 138 139 142 137 139  K 3.5 3.7 3.3* 3.8 3.6 3.7 3.6  CL 109 111 107 108  --   --    --   CO2 23 23 23 26   --   --   --   GLUCOSE 113* 94 88 97  --   --   --   BUN 10 7 9 10 11 14 12   CREATININE 0.99 1.01* 1.04* 0.91 1.1 1.0 1.0  CALCIUM 9.2 8.8* 9.0 9.0  --   --   --   MG 2.4  --   --   --   --   --   --   PHOS 2.3*  --   --   --   --   --   --    Recent Labs    07/13/16 1420 07/24/16 08/13/16 12/17/16  AST 25 15 17 17   ALT 16 13 16 14   ALKPHOS 110 92 108 101  BILITOT 0.8  --   --   --   PROT 6.8  --   --   --   ALBUMIN 4.2  --   --   --    Recent Labs    07/13/16 1420  07/14/16 0638 07/15/16 0409  08/13/16 12/17/16 02/12/17  WBC 8.2  --  5.8 6.5   < > 6.8 7.6 5.6  NEUTROABS 5.7  --   --   --   --   --   --   --   HGB 14.4  --  13.2 13.3   < > 13.5 12.8 13.6  HCT 43.9   < > 41.3 41.3   < > 41 38 40  MCV 94.2  --  97.9 95.6  --   --   --   --   PLT 213  --  210 191   < > 264 217 251   < > = values in this interval not displayed.   Lab Results  Component Value Date   TSH 1.56 02/12/2017   Lab Results  Component Value Date   HGBA1C 5.5 12/02/2016   Lab Results  Component Value Date   CHOL 174 10/21/2016   HDL 45 10/21/2016   LDLCALC 103 10/21/2016   TRIG 158 10/21/2016    Significant Diagnostic Results in last 30 days:  No results found.  Assessment/Plan  1. Essential hypertension, benign Stable, continue metoprolol tartrate current regimen. Check bmp  2. History of CVA (cerebrovascular accident) Continue antihypertensive, statin and xarelto. Check lipid  3. Gastroesophageal reflux disease without esophagitis Appears  controlled. With her on anticoagulation, continue PPI  4. Chronic constipation Continue miralax, maintain hydration  5. Vascular dementia with behavior disturbance Supportive care. Continue sertraline and risperdal. Has gained weight. D/c remeron after tapering it to every other day for 1 week  6. Unsteady gait Fall precautions, high fall risk, walker for ambulation     Family/ staff Communication: reviewed care  plan with patient and charge nurse.    Labs/tests ordered:  Lipid, CMP 06/09/17   Blanchie Serve, MD Internal Medicine [facility] [address] [place], [place] [address] Cell Phone (Monday-Friday 8 am - 5 pm): [phone] On Call: [phone] and follow prompts after 5 pm and on weekends Office Phone: [phone] Office Fax: [phone]

## 2017-05-25 ENCOUNTER — Non-Acute Institutional Stay (SKILLED_NURSING_FACILITY): Payer: Medicare Other | Admitting: Nurse Practitioner

## 2017-05-25 ENCOUNTER — Encounter: Payer: Self-pay | Admitting: Nurse Practitioner

## 2017-05-25 DIAGNOSIS — L989 Disorder of the skin and subcutaneous tissue, unspecified: Secondary | ICD-10-CM

## 2017-05-25 DIAGNOSIS — F0151 Vascular dementia with behavioral disturbance: Secondary | ICD-10-CM

## 2017-05-25 DIAGNOSIS — F01518 Vascular dementia, unspecified severity, with other behavioral disturbance: Secondary | ICD-10-CM

## 2017-05-25 NOTE — Progress Notes (Signed)
Location:  [facility] Room Number: [place]:  SNF (31) Provider:  Jeslynn Hollander, ManXie  NP  Blanchie Serve, MD  Patient Care Team: Blanchie Serve, MD as PCP - General (Internal Medicine) Mannix Kroeker X, NP as Nurse Practitioner (Internal Medicine) Juluis Rainier as Consulting Physician (Optometry) Monna Fam, MD as Consulting Physician (Ophthalmology)  Extended Emergency Contact Information Primary Emergency Contact: [facility] Address: [address], [place] [address] [name] of [place] Phone: [phone] Work Phone: [phone] Mobile Phone: [phone] Relation: Daughter  Code Status:  DNR Goals of care: Advanced Directive information Advanced Directives 05/25/2017  Does Patient Have a Medical Advance Directive? Yes  Type of Paramedic of [place];Living will;Out of facility DNR (pink MOST or yellow form)  Does patient want to make changes to medical advance directive? No - Patient declined  Copy of [place] in Chart? Yes  Pre-existing out of facility DNR order (yellow form or pink MOST form) Yellow form placed in chart (order not valid for inpatient use)     Chief Complaint  Patient presents with  . Acute Visit    Sm red scab on forehead, ? cancer    HPI:  Pt is a 84 y.o. female seen today for an acute visit for mid forehead skin lesion, raised with a scabbed over it, less than a pencil eraser size, regular shape, no peri lesion s/s of infection, duration unknown, hx of skin caner. The patient denied pain or irritation, HPI was provided with assistance of staff due to her dementia.    Past Medical History:  Diagnosis Date  . Acute encephalopathy   . Aphasia following unspecified cerebrovascular disease   . 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 Depression   . Dysphagia following cerebrovascular disease   . Hepatic steatosis   .  Hydroureteronephrosis   . Hypertension   . Insomnia   . Macular degeneration   . Major depressive disorder, recurrent ([place])   . Neuralgia and neuritis, unspecified (CODE)   . Nontraumatic intracranial hemorrhage ([place])   . Stroke ([place])   . 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   . 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 ABDOMINAL HYSTERECTOMY    . APPENDECTOMY    . BREAST SURGERY    . MASTECTOMY Right     Allergies  Allergen Reactions  . Hydrocodone Nausea And Vomiting  . Morphine And Related Nausea And Vomiting  . Actonel [Risedronate Sodium] Other (See Comments)    unknown  . Darvon [Propoxyphene Hcl] Other (See Comments)    unknown  . Oxycodone Hcl Other (See Comments)    unknown  . Pneumovax [Pneumococcal Polysaccharide Vaccine] Other (See Comments)    unknown    Outpatient Encounter Medications as of 05/25/2017  Medication Sig  . acetaminophen (TYLENOL) 325 MG tablet Take 650 mg by mouth every 4 (four) hours as needed for pain.  Marland Kitchen atorvastatin (LIPITOR) 10 MG tablet Take 10 mg by mouth daily.  . bisacodyl (DULCOLAX) 10 MG suppository Place 1 suppository (10 mg total) rectally daily as needed for moderate constipation.  . fluticasone (FLONASE) 50 MCG/ACT nasal spray Place 1 spray into both nostrils as needed for allergies.   . hydrocortisone 2.5 % cream Apply 1 application topically 2 (two)  times daily as needed. Apply to affected area on face  . metoprolol tartrate (LOPRESSOR) 25 MG tablet Take 12.5 mg by mouth 2 (two) times daily.   Marland Kitchen nystatin (NYSTATIN) powder Apply topically daily as needed. Under left breast   . omeprazole (PRILOSEC) 20 MG capsule Take 10 mg by mouth daily.   . polyethylene glycol (MIRALAX / GLYCOLAX) packet Take 17 g by mouth daily.  . risperiDONE (RISPERDAL) 0.25 MG tablet Take 0.25 mg by mouth 2 (two) times daily.  . Rivaroxaban (XARELTO)  15 MG TABS tablet Take 1 tablet (15 mg total) by mouth daily at 6 PM.  . sertraline (ZOLOFT) 100 MG tablet Take 1 tablet (100 mg total) by mouth daily at 6 PM.  . sertraline (ZOLOFT) 25 MG tablet Take 25 mg by mouth daily. Take along with 100 mg tablet to equal 125 mg.  . Simethicone (GAS RELIEF 80 PO) Take 80 mg by mouth 3 (three) times daily with meals.   . [DISCONTINUED] mirtazapine (REMERON) 15 MG tablet Take 15 mg by mouth at bedtime.  . [DISCONTINUED] mirtazapine (REMERON) 7.5 MG tablet Take 7.5 mg by mouth at bedtime.   No facility-administered encounter medications on file as of 05/25/2017.    ROS was provided with assistance of staff Review of Systems  Constitutional: Negative for activity change, appetite change, chills, diaphoresis, fatigue and fever.  HENT: Positive for hearing loss.   Cardiovascular: Positive for leg swelling.  Musculoskeletal: Positive for gait problem.  Skin:       Mid forehead skin lesion.   Neurological: Positive for speech difficulty. Negative for dizziness and headaches.       Dementia.   Psychiatric/Behavioral: Positive for confusion. Negative for agitation, behavioral problems and hallucinations. The patient is not nervous/anxious.     Immunization History  Administered Date(s) Administered  . Influenza-Unspecified 11/20/2013, 09/26/2014, 10/24/2015   Pertinent  Health Maintenance Due  Topic Date Due  . DEXA SCAN  07/18/1994  . PNA vac Low Risk Adult (1 of 2 - PCV13) 07/18/1994  . INFLUENZA VACCINE  08/06/2017   Fall Risk  09/05/2016  Falls in the past year? No   Functional Status Survey:    Vitals:   05/25/17 1154  BP: 130/80  Pulse: 80  Resp: 16  Temp: (!) 97.2 F (36.2 C)  SpO2: 97%  Weight: 169 lb 9.6 oz (76.9 kg)  Height: 5\' 3"  (1.6 m)   Body mass index is 30.04 kg/m. Physical Exam  Constitutional: She appears well-developed and well-nourished.  HENT:  Head: Normocephalic and atraumatic.  Eyes: Pupils are equal, round,  and reactive to light. EOM are normal.  Neck: Normal range of motion. Neck supple. No JVD present. No thyromegaly present.  Musculoskeletal: She exhibits edema.  Self transfer, ambulating with walker with SBA  Neurological: She is alert. No cranial nerve deficit. She exhibits normal muscle tone. Coordination normal.  Oriented to self and her room on unit.   Skin: Skin is warm and dry.  Multiple AKs, SKs. A raised skin lesion, < pencil eraser size, scabbed over, peri lesion no s/s of infection, no pain palpated, regular shape.     Psychiatric: She has a normal mood and affect. Her behavior is normal.    Labs reviewed: Recent Labs    07/13/16 1420 07/14/16 0638 07/15/16 0409 07/17/16 0627 07/24/16 08/13/16 12/17/16  NA 140 142 138 139 142 137 139  K 3.5 3.7 3.3* 3.8 3.6 3.7 3.6  CL 109 111 107 108  --   --   --  CO2 23 23 23 26   --   --   --   GLUCOSE 113* 94 88 97  --   --   --   BUN 10 7 9 10 11 14 12   CREATININE 0.99 1.01* 1.04* 0.91 1.1 1.0 1.0  CALCIUM 9.2 8.8* 9.0 9.0  --   --   --   MG 2.4  --   --   --   --   --   --   PHOS 2.3*  --   --   --   --   --   --    Recent Labs    07/13/16 1420 07/24/16 08/13/16 12/17/16  AST 25 15 17 17   ALT 16 13 16 14   ALKPHOS 110 92 108 101  BILITOT 0.8  --   --   --   PROT 6.8  --   --   --   ALBUMIN 4.2  --   --   --    Recent Labs    07/13/16 1420  07/14/16 0638 07/15/16 0409  08/13/16 12/17/16 02/12/17  WBC 8.2  --  5.8 6.5   < > 6.8 7.6 5.6  NEUTROABS 5.7  --   --   --   --   --   --   --   HGB 14.4  --  13.2 13.3   < > 13.5 12.8 13.6  HCT 43.9   < > 41.3 41.3   < > 41 38 40  MCV 94.2  --  97.9 95.6  --   --   --   --   PLT 213  --  210 191   < > 264 217 251   < > = values in this interval not displayed.   Lab Results  Component Value Date   TSH 1.56 02/12/2017   Lab Results  Component Value Date   HGBA1C 5.5 12/02/2016   Lab Results  Component Value Date   CHOL 174 10/21/2016   HDL 45 10/21/2016   LDLCALC  103 10/21/2016   TRIG 158 10/21/2016    Significant Diagnostic Results in last 30 days:  No results found.  Assessment/Plan: Skin lesion  mid forehead skin lesion, raised with a scabbed over it, less than a pencil eraser size, regular shape, no peri lesion s/s of infection, duration unknown, hx of skin caner. The patient denied pain or irritation. Observe, Dermatology referral when the patient's POA desires.    Vascular dementia with behavior disturbance he patient denied pain or irritation, HPI was provided with assistance of staff due to her dementia.      Family/ staff Communication: plan of care reviewed with the patient and charge nurse.   Labs/tests ordered: none   Time spend 25 minutes.

## 2017-05-25 NOTE — Assessment & Plan Note (Signed)
mid forehead skin lesion, raised with a scabbed over it, less than a pencil eraser size, regular shape, no peri lesion s/s of infection, duration unknown, hx of skin caner. The patient denied pain or irritation. Observe, Dermatology referral when the patient's POA desires.

## 2017-05-25 NOTE — Assessment & Plan Note (Signed)
he patient denied pain or irritation, HPI was provided with assistance of staff due to her dementia.

## 2017-05-28 ENCOUNTER — Encounter: Payer: Self-pay | Admitting: Nurse Practitioner

## 2017-06-04 ENCOUNTER — Non-Acute Institutional Stay (SKILLED_NURSING_FACILITY): Payer: Medicare Other | Admitting: Nurse Practitioner

## 2017-06-04 ENCOUNTER — Encounter: Payer: Self-pay | Admitting: Nurse Practitioner

## 2017-06-04 DIAGNOSIS — F29 Unspecified psychosis not due to a substance or known physiological condition: Secondary | ICD-10-CM

## 2017-06-04 DIAGNOSIS — F01518 Vascular dementia, unspecified severity, with other behavioral disturbance: Secondary | ICD-10-CM

## 2017-06-04 DIAGNOSIS — F0280 Dementia in other diseases classified elsewhere without behavioral disturbance: Secondary | ICD-10-CM

## 2017-06-04 DIAGNOSIS — F329 Major depressive disorder, single episode, unspecified: Secondary | ICD-10-CM

## 2017-06-04 DIAGNOSIS — F0151 Vascular dementia with behavioral disturbance: Secondary | ICD-10-CM

## 2017-06-04 DIAGNOSIS — F0393 Unspecified dementia, unspecified severity, with mood disturbance: Secondary | ICD-10-CM

## 2017-06-04 DIAGNOSIS — R630 Anorexia: Secondary | ICD-10-CM | POA: Insufficient documentation

## 2017-06-04 NOTE — Progress Notes (Addendum)
Location:   SNF [facility] Room Number: 144 YJEHU of Service:  SNF (31) Provider: Lennie Odor Makari Portman NP  Blanchie Serve, MD  Patient Care Team: Blanchie Serve, MD as PCP - General (Internal Medicine) Natalea Sutliff X, NP as Nurse Practitioner (Internal Medicine) Juluis Rainier as Consulting Physician (Optometry) Monna Fam, MD as Consulting Physician (Ophthalmology) Lucky Cowboy, Gordy Clement, NP (Inactive) as Nurse Practitioner [facility] and Palliative Medicine)  Extended Emergency Contact Information Primary Emergency Contact: [facility] Address: [address], [place] [address] [name] of [place] Phone: [phone] Work Phone: [phone] Mobile Phone: [phone] Relation: Daughter  Code Status: DNR Goals of care: Advanced Directive information Advanced Directives 07/13/2017  Does Patient Have a Medical Advance Directive? Yes  Type of Advance Directive Out of facility DNR (pink MOST or yellow form);[place];Living will  Does patient want to make changes to medical advance directive? No - Patient declined  Copy of [place] in Chart? Yes  Pre-existing out of facility DNR order (yellow form or pink MOST form) Yellow form placed in chart (order not valid for inpatient use)     Chief Complaint  Patient presents with  . Acute Visit    Behaviors    HPI:  Pt is a 84 y.o. female seen today for an acute visit for appetite reportedly poor, sad facial looks, crying episodes, irritable, resistant to care assistance. Off Mirtazapine 05/25/17. Hx of depression, on Sertraline 125mg  qd. Psychosis is well managed on Risperdal 0.25mg  bid. She resides in [facility], ambulates with walker. No taking memory preserving meds. She denied nausea, vomiting, constipation, diarrhea, or abd pain.    Past Medical History:  Diagnosis Date  . Acute encephalopathy   . Aphasia following unspecified cerebrovascular  disease   . 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 Depression   . Dysphagia following cerebrovascular disease   . Hepatic steatosis   . Hydroureteronephrosis   . Hypertension   . Insomnia   . Macular degeneration   . Major depressive disorder, recurrent ([place])   . Neuralgia and neuritis, unspecified (CODE)   . Nontraumatic intracranial hemorrhage ([place])   . Stroke ([place])   . 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   . 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 ABDOMINAL HYSTERECTOMY    . APPENDECTOMY    . BREAST SURGERY    . MASTECTOMY Right     Allergies  Allergen Reactions  . Hydrocodone Nausea And Vomiting  . Morphine And Related Nausea And Vomiting  . Actonel [Risedronate Sodium] Other (See Comments)    unknown  . Darvon [Propoxyphene Hcl] Other (See Comments)    unknown  . Oxycodone Hcl Other (See Comments)    unknown  . Pneumovax [Pneumococcal Polysaccharide Vaccine] Other (See Comments)    unknown    Allergies as of 06/04/2017      Reactions   Hydrocodone Nausea And Vomiting   Morphine And Related Nausea And Vomiting   Actonel [risedronate Sodium] Other (See Comments)   unknown   Darvon [propoxyphene Hcl] Other (See Comments)   unknown   Oxycodone Hcl Other (See Comments)   unknown   Pneumovax [pneumococcal Polysaccharide Vaccine] Other (See Comments)   unknown      Medication List        Accurate  as of 06/04/17 11:59 PM. Always use your most recent med list.          acetaminophen 325 MG tablet Commonly known as:  TYLENOL Take 650 mg by mouth every 4 (four) hours as needed for pain.   atorvastatin 10 MG tablet Commonly known as:  LIPITOR Take 10 mg by mouth daily.   bisacodyl 10 MG suppository Commonly known as:  DULCOLAX Place 1 suppository (10 mg total) rectally daily as needed for moderate constipation.   fluticasone 50  MCG/ACT nasal spray Commonly known as:  FLONASE Place 1 spray into both nostrils as needed for allergies.   GAS RELIEF 80 PO Take 80 mg by mouth 3 (three) times daily with meals.   hydrocortisone 2.5 % cream Apply 1 application topically 2 (two) times daily as needed. Apply to affected area on face   metoprolol tartrate 25 MG tablet Commonly known as:  LOPRESSOR Take 12.5 mg by mouth 2 (two) times daily.   nystatin powder Generic drug:  nystatin Apply topically daily as needed. Under left breast   omeprazole 20 MG capsule Commonly known as:  PRILOSEC Take 10 mg by mouth daily.   polyethylene glycol packet Commonly known as:  MIRALAX / GLYCOLAX Take 17 g by mouth daily.   risperiDONE 0.25 MG tablet Commonly known as:  RISPERDAL Take 0.25 mg by mouth 2 (two) times daily.   Rivaroxaban 15 MG Tabs tablet Commonly known as:  XARELTO Take 1 tablet (15 mg total) by mouth daily at 6 PM.   sertraline 25 MG tablet Commonly known as:  ZOLOFT Take 25 mg by mouth daily. Take along with 100 mg tablet to equal 125 mg.   sertraline 100 MG tablet Commonly known as:  ZOLOFT Take 1 tablet (100 mg total) by mouth daily at 6 PM.      ROS was provided with assistance of staff Review of Systems  Constitutional: Positive for appetite change and fatigue. Negative for activity change, chills, diaphoresis and fever.  HENT: Positive for hearing loss. Negative for congestion, trouble swallowing and voice change.   Respiratory: Negative for cough and shortness of breath.   Cardiovascular: Positive for leg swelling. Negative for chest pain and palpitations.  Gastrointestinal: Negative for abdominal distention, abdominal pain, constipation, diarrhea, nausea and vomiting.  Genitourinary: Negative for difficulty urinating and dysuria.  Musculoskeletal: Positive for gait problem.  Neurological: Positive for speech difficulty. Negative for weakness.       Dementia.   Psychiatric/Behavioral:  Positive for agitation, behavioral problems and confusion. Negative for hallucinations and sleep disturbance. The patient is not nervous/anxious.     Immunization History  Administered Date(s) Administered  . Influenza-Unspecified 11/20/2013, 09/26/2014, 10/24/2015, 11/03/2016  . Tdap 09/19/2016   Pertinent  Health Maintenance Due  Topic Date Due  . DEXA SCAN  07/18/1994  . INFLUENZA VACCINE  08/06/2017   Fall Risk  09/05/2016  Falls in the past year? No   Functional Status Survey:    Vitals:   06/04/17 1257  BP: 120/68  Pulse: 72  Resp: 18  Temp: 98 F (36.7 C)  SpO2: 97%  Weight: 169 lb 9.6 oz (76.9 kg)  Height: 5\' 3"  (1.6 m)   Body mass index is 30.04 kg/m. Physical Exam  Constitutional: She appears well-developed and well-nourished.  HENT:  Head: Normocephalic and atraumatic.  Eyes: Pupils are equal, round, and reactive to light. EOM are normal.  Neck: Normal range of motion. Neck supple. No JVD present. No thyromegaly present.  Cardiovascular:  Normal rate and regular rhythm.  No murmur heard. Pulmonary/Chest: She has no wheezes.  Abdominal: Soft. Bowel sounds are normal. She exhibits no distension. There is no tenderness.  Musculoskeletal: She exhibits edema.  Trace edema BLE, ambulates with walker.   Neurological: She is alert. No cranial nerve deficit. She exhibits normal muscle tone. Coordination normal.  Oriented to self.   Skin: Skin is warm and dry.  Psychiatric:  Sad facial looks.     Labs reviewed: Recent Labs    07/13/16 1420 07/14/16 0638 07/15/16 0409 07/17/16 0627 07/24/16 08/13/16 12/17/16  NA 140 142 138 139 142 137 139  K 3.5 3.7 3.3* 3.8 3.6 3.7 3.6  CL 109 111 107 108  --   --   --   CO2 23 23 23 26   --   --   --   GLUCOSE 113* 94 88 97  --   --   --   BUN 10 7 9 10 11 14 12   CREATININE 0.99 1.01* 1.04* 0.91 1.1 1.0 1.0  CALCIUM 9.2 8.8* 9.0 9.0  --   --   --   MG 2.4  --   --   --   --   --   --   PHOS 2.3*  --   --   --   --    --   --    Recent Labs    07/13/16 1420 07/24/16 08/13/16 12/17/16  AST 25 15 17 17   ALT 16 13 16 14   ALKPHOS 110 92 108 101  BILITOT 0.8  --   --   --   PROT 6.8  --   --   --   ALBUMIN 4.2  --   --   --    Recent Labs    07/13/16 1420  07/14/16 0638 07/15/16 0409  08/13/16 12/17/16 02/12/17  WBC 8.2  --  5.8 6.5   < > 6.8 7.6 5.6  NEUTROABS 5.7  --   --   --   --   --   --   --   HGB 14.4  --  13.2 13.3   < > 13.5 12.8 13.6  HCT 43.9   < > 41.3 41.3   < > 41 38 40  MCV 94.2  --  97.9 95.6  --   --   --   --   PLT 213  --  210 191   < > 264 217 251   < > = values in this interval not displayed.   Lab Results  Component Value Date   TSH 1.56 02/12/2017   Lab Results  Component Value Date   HGBA1C 5.5 12/02/2016   Lab Results  Component Value Date   CHOL 174 10/21/2016   HDL 45 10/21/2016   LDLCALC 103 10/21/2016   TRIG 158 10/21/2016    Significant Diagnostic Results in last 30 days:  No results found.  Assessment/Plan: Depression due to dementia Hx of depression, exhibiting s/s of depression, continue Sertraline 125mg  po daily, referral to psycho therapy. Observe.   Vascular dementia with behavior disturbance Continue memory care unit [facility] for care assistance. Observe.   Psychosis Stable, continue Risperdal 0.25mg  bid.   Decrease in appetite Will obtain CBC/diff, CMP, continue to observe the patient.     Family/ staff Communication: plan of care reviewed with the patient and charge nurse.   Labs/tests ordered: CMP and  CBC/diff  Time spend 25 minutes.

## 2017-06-04 NOTE — Progress Notes (Signed)
Location:  [facility] Room Number: [place]:  SNF (31) Provider:  Mast, ManXie  NP  Blanchie Serve, MD  Patient Care Team: Blanchie Serve, MD as PCP - General (Internal Medicine) Mast, Man X, NP as Nurse Practitioner (Internal Medicine) Juluis Rainier as Consulting Physician (Optometry) Monna Fam, MD as Consulting Physician (Ophthalmology)  Extended Emergency Contact Information Primary Emergency Contact: [facility] Address: [address],  [address] [name] of [place] Phone: [phone] Work Phone: [phone] Mobile Phone: [phone] Relation: Daughter  Code Status:  DNR Goals of care: Advanced Directive information Advanced Directives 05/25/2017  Does Patient Have a Medical Advance Directive? Yes  Type of Paramedic of [place];Living will;Out of facility DNR (pink MOST or yellow form)  Does patient want to make changes to medical advance directive? No - Patient declined  Copy of [place] in Chart? Yes  Pre-existing out of facility DNR order (yellow form or pink MOST form) Yellow form placed in chart (order not valid for inpatient use)     Chief Complaint  Patient presents with  . Acute Visit    Behaviors    HPI:  Pt is a 84 y.o. female seen today for an acute visit for    Past Medical History:  Diagnosis Date  . Acute encephalopathy   . Aphasia following unspecified cerebrovascular disease   . 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 Depression   . Dysphagia following cerebrovascular disease   . Hepatic steatosis   . Hydroureteronephrosis   . Hypertension   . Insomnia   . Macular degeneration   . Major depressive disorder, recurrent ([place])   . Neuralgia and neuritis, unspecified (CODE)   . Nontraumatic intracranial hemorrhage ([place])   . Stroke ([place])   . Unspecified type of carcinoma in situ of unspecified  breast   . 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 ABDOMINAL HYSTERECTOMY    . APPENDECTOMY    . BREAST SURGERY    . MASTECTOMY Right     Allergies  Allergen Reactions  . Hydrocodone Nausea And Vomiting  . Morphine And Related Nausea And Vomiting  . Actonel [Risedronate Sodium] Other (See Comments)    unknown  . Darvon [Propoxyphene Hcl] Other (See Comments)    unknown  . Oxycodone Hcl Other (See Comments)    unknown  . Pneumovax [Pneumococcal Polysaccharide Vaccine] Other (See Comments)    unknown    Outpatient Encounter Medications as of 06/04/2017  Medication Sig  . acetaminophen (TYLENOL) 325 MG tablet Take 650 mg by mouth every 4 (four) hours as needed for pain.  Marland Kitchen atorvastatin (LIPITOR) 10 MG tablet Take 10 mg by mouth daily.  . bisacodyl (DULCOLAX) 10 MG suppository Place 1 suppository (10 mg total) rectally daily as needed for moderate constipation.  . fluticasone (FLONASE) 50 MCG/ACT nasal spray Place 1 spray into both nostrils as needed for allergies.   . hydrocortisone 2.5 % cream Apply 1 application topically 2 (two) times daily as needed. Apply to affected area on face  . metoprolol tartrate (LOPRESSOR) 25 MG tablet Take 12.5 mg by mouth 2 (two) times daily.   Marland Kitchen nystatin (NYSTATIN) powder Apply topically daily as needed. Under left breast   . omeprazole (PRILOSEC) 20 MG capsule Take 10 mg by  mouth daily.   . polyethylene glycol (MIRALAX / GLYCOLAX) packet Take 17 g by mouth daily.  . risperiDONE (RISPERDAL) 0.25 MG tablet Take 0.25 mg by mouth 2 (two) times daily.  . Rivaroxaban (XARELTO) 15 MG TABS tablet Take 1 tablet (15 mg total) by mouth daily at 6 PM.  . sertraline (ZOLOFT) 100 MG tablet Take 1 tablet (100 mg total) by mouth daily at 6 PM.  . sertraline (ZOLOFT) 25 MG tablet Take 25 mg by mouth daily. Take along with 100 mg tablet to equal 125 mg.  . Simethicone (GAS RELIEF  80 PO) Take 80 mg by mouth 3 (three) times daily with meals.    No facility-administered encounter medications on file as of 06/04/2017.     Review of Systems  Immunization History  Administered Date(s) Administered  . Influenza-Unspecified 11/20/2013, 09/26/2014, 10/24/2015   Pertinent  Health Maintenance Due  Topic Date Due  . DEXA SCAN  07/18/1994  . PNA vac Low Risk Adult (1 of 2 - PCV13) 07/18/1994  . INFLUENZA VACCINE  08/06/2017   Fall Risk  09/05/2016  Falls in the past year? No   Functional Status Survey:    Vitals:   06/04/17 1257  BP: 120/68  Pulse: 72  Resp: 18  Temp: 98 F (36.7 C)  SpO2: 97%  Weight: 169 lb 9.6 oz (76.9 kg)  Height: 5\' 3"  (1.6 m)   Body mass index is 30.04 kg/m. Physical Exam  Labs reviewed: Recent Labs    07/13/16 1420 07/14/16 0638 07/15/16 0409 07/17/16 0627 07/24/16 08/13/16 12/17/16  NA 140 142 138 139 142 137 139  K 3.5 3.7 3.3* 3.8 3.6 3.7 3.6  CL 109 111 107 108  --   --   --   CO2 23 23 23 26   --   --   --   GLUCOSE 113* 94 88 97  --   --   --   BUN 10 7 9 10 11 14 12   CREATININE 0.99 1.01* 1.04* 0.91 1.1 1.0 1.0  CALCIUM 9.2 8.8* 9.0 9.0  --   --   --   MG 2.4  --   --   --   --   --   --   PHOS 2.3*  --   --   --   --   --   --    Recent Labs    07/13/16 1420 07/24/16 08/13/16 12/17/16  AST 25 15 17 17   ALT 16 13 16 14   ALKPHOS 110 92 108 101  BILITOT 0.8  --   --   --   PROT 6.8  --   --   --   ALBUMIN 4.2  --   --   --    Recent Labs    07/13/16 1420  07/14/16 0638 07/15/16 0409  08/13/16 12/17/16 02/12/17  WBC 8.2  --  5.8 6.5   < > 6.8 7.6 5.6  NEUTROABS 5.7  --   --   --   --   --   --   --   HGB 14.4  --  13.2 13.3   < > 13.5 12.8 13.6  HCT 43.9   < > 41.3 41.3   < > 41 38 40  MCV 94.2  --  97.9 95.6  --   --   --   --   PLT 213  --  210 191   < > 264 217 251   < > =  values in this interval not displayed.   Lab Results  Component Value Date   TSH 1.56 02/12/2017   Lab Results  Component  Value Date   HGBA1C 5.5 12/02/2016   Lab Results  Component Value Date   CHOL 174 10/21/2016   HDL 45 10/21/2016   LDLCALC 103 10/21/2016   TRIG 158 10/21/2016    Significant Diagnostic Results in last 30 days:  No results found.  Assessment/Plan There are no diagnoses linked to this encounter.   Family/ staff Communication:   Labs/tests ordered:

## 2017-06-04 NOTE — Assessment & Plan Note (Signed)
Hx of depression, exhibiting s/s of depression, continue Sertraline 125mg  po daily, referral to psycho therapy. Observe.

## 2017-06-04 NOTE — Assessment & Plan Note (Signed)
Continue memory care unit [facility] for care assistance. Observe.

## 2017-06-04 NOTE — Assessment & Plan Note (Signed)
Stable, continue Risperdal 0.25mg bid.  

## 2017-06-04 NOTE — Assessment & Plan Note (Signed)
Will obtain CBC/diff, CMP, continue to observe the patient.

## 2017-06-11 ENCOUNTER — Non-Acute Institutional Stay (SKILLED_NURSING_FACILITY): Payer: Medicare Other | Admitting: Nurse Practitioner

## 2017-06-11 ENCOUNTER — Encounter: Payer: Self-pay | Admitting: Nurse Practitioner

## 2017-06-11 DIAGNOSIS — F29 Unspecified psychosis not due to a substance or known physiological condition: Secondary | ICD-10-CM | POA: Diagnosis not present

## 2017-06-11 DIAGNOSIS — R627 Adult failure to thrive: Secondary | ICD-10-CM | POA: Insufficient documentation

## 2017-06-11 DIAGNOSIS — F329 Major depressive disorder, single episode, unspecified: Secondary | ICD-10-CM

## 2017-06-11 DIAGNOSIS — F0280 Dementia in other diseases classified elsewhere without behavioral disturbance: Secondary | ICD-10-CM | POA: Diagnosis not present

## 2017-06-11 DIAGNOSIS — F0393 Unspecified dementia, unspecified severity, with mood disturbance: Secondary | ICD-10-CM

## 2017-06-11 DIAGNOSIS — F01518 Vascular dementia, unspecified severity, with other behavioral disturbance: Secondary | ICD-10-CM

## 2017-06-11 DIAGNOSIS — F0151 Vascular dementia with behavioral disturbance: Secondary | ICD-10-CM | POA: Diagnosis not present

## 2017-06-11 NOTE — Assessment & Plan Note (Signed)
Stable, continue Risperdal 0.25mg  bid po.

## 2017-06-11 NOTE — Progress Notes (Signed)
Location:  [place] Room Number: [place]:  SNF (31) Provider:  Jaceion Aday, ManXie  NP  Blanchie Serve, MD  Patient Care Team: Blanchie Serve, MD as PCP - General (Internal Medicine) Larenz Frasier X, NP as Nurse Practitioner (Internal Medicine) Juluis Rainier as Consulting Physician (Optometry) Monna Fam, MD as Consulting Physician (Ophthalmology)  Extended Emergency Contact Information Primary Emergency Contact: [facility] Address: [address], [place] [address] [name] of [place] Phone: [phone] Work Phone: [phone] Mobile Phone: [phone] Relation: Daughter  Code Status:  DNR Goals of care: Advanced Directive information Advanced Directives 05/25/2017  Does Patient Have a Medical Advance Directive? Yes  Type of Paramedic of [place];Living will;Out of facility DNR (pink MOST or yellow form)  Does patient want to make changes to medical advance directive? No - Patient declined  Copy of [place] in Chart? Yes  Pre-existing out of facility DNR order (yellow form or pink MOST form) Yellow form placed in chart (order not valid for inpatient use)     Chief Complaint  Patient presents with  . Acute Visit    Failure to thrive    HPI:  Pt is a 84 y.o. female seen today for an acute visit for deconditioning in mood, crying, anxious, frightful, refused labs. Her appetite was reportedly poor, lost 2-3 Ibs in the past 2-3 weeks since she is off Remeron 7.5mg  daily. Her BMI is still 29.12. She denied pain or discomfort. She was seen in bed between breakfast and lunch which is unusual for her. Psych therapy is not available until end of this month. She has history of depression, currently on Sertraline 125mg  qd. HPI was provided with assistance of staff due to her dementia. She resides in memory care unit [facility], self transfer, ambulating with walker. Her  psychosis is stable on Risperdal 0.25mg  bid po.    Past Medical History:  Diagnosis Date  . Acute encephalopathy   . Aphasia following unspecified cerebrovascular disease   . 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 Depression   . Dysphagia following cerebrovascular disease   . Hepatic steatosis   . Hydroureteronephrosis   . Hypertension   . Insomnia   . Macular degeneration   . Major depressive disorder, recurrent ([place])   . Neuralgia and neuritis, unspecified (CODE)   . Nontraumatic intracranial hemorrhage ([place])   . Stroke ([place])   . 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   . 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 ABDOMINAL HYSTERECTOMY    . APPENDECTOMY    . BREAST SURGERY    . MASTECTOMY Right     Allergies  Allergen Reactions  . Hydrocodone Nausea And Vomiting  . Morphine And Related Nausea And Vomiting  . Actonel [Risedronate Sodium] Other (See Comments)    unknown  . Darvon [Propoxyphene Hcl] Other (See Comments)    unknown  . Oxycodone Hcl Other (See Comments)    unknown  . Pneumovax [Pneumococcal Polysaccharide Vaccine] Other (See Comments)    unknown    Outpatient Encounter Medications as of 06/11/2017  Medication Sig  . acetaminophen (TYLENOL) 325 MG tablet Take 650 mg by mouth every 4 (four) hours as needed for pain.  Marland Kitchen atorvastatin (LIPITOR) 10 MG tablet Take 10 mg by  mouth daily.  . bisacodyl (DULCOLAX) 10 MG suppository Place 1 suppository (10 mg total) rectally daily as needed for moderate constipation.  . fluticasone (FLONASE) 50 MCG/ACT nasal spray Place 1 spray into both nostrils as needed for allergies.   . hydrocortisone 2.5 % cream Apply 1 application topically 2 (two) times daily as needed. Apply to affected area on face  . metoprolol tartrate (LOPRESSOR) 25 MG tablet Take 12.5 mg by mouth 2 (two) times daily.   Marland Kitchen nystatin  (NYSTATIN) powder Apply topically daily as needed. Under left breast   . omeprazole (PRILOSEC) 20 MG capsule Take 10 mg by mouth daily.   . polyethylene glycol (MIRALAX / GLYCOLAX) packet Take 17 g by mouth daily.  . risperiDONE (RISPERDAL) 0.25 MG tablet Take 0.25 mg by mouth 2 (two) times daily.  . Rivaroxaban (XARELTO) 15 MG TABS tablet Take 1 tablet (15 mg total) by mouth daily at 6 PM.  . sertraline (ZOLOFT) 100 MG tablet Take 1 tablet (100 mg total) by mouth daily at 6 PM.  . sertraline (ZOLOFT) 25 MG tablet Take 25 mg by mouth daily. Take along with 100 mg tablet to equal 125 mg.  . Simethicone (GAS RELIEF 80 PO) Take 80 mg by mouth 3 (three) times daily with meals.    No facility-administered encounter medications on file as of 06/11/2017.    ROS provided with assistance of staff Review of Systems  Constitutional: Positive for activity change, appetite change, fatigue and unexpected weight change. Negative for chills, diaphoresis and fever.  HENT: Positive for hearing loss. Negative for congestion, trouble swallowing and voice change.   Respiratory: Negative for cough, shortness of breath and wheezing.   Cardiovascular: Negative for chest pain, palpitations and leg swelling.  Gastrointestinal: Negative for abdominal distention, abdominal pain, constipation, diarrhea, nausea and vomiting.  Genitourinary: Negative for difficulty urinating, dysuria and urgency.  Musculoskeletal: Positive for gait problem. Negative for arthralgias and back pain.  Skin: Negative for color change and pallor.  Neurological: Negative for facial asymmetry, speech difficulty, weakness and headaches.       Dementia  Psychiatric/Behavioral: Positive for behavioral problems and confusion. Negative for agitation, hallucinations and sleep disturbance. The patient is nervous/anxious.     Immunization History  Administered Date(s) Administered  . Influenza-Unspecified 11/20/2013, 09/26/2014, 10/24/2015   Pertinent   Health Maintenance Due  Topic Date Due  . DEXA SCAN  07/18/1994  . PNA vac Low Risk Adult (1 of 2 - PCV13) 07/18/1994  . INFLUENZA VACCINE  08/06/2017   Fall Risk  09/05/2016  Falls in the past year? No   Functional Status Survey:    Vitals:   06/11/17 1149  BP: 122/64  Pulse: 74  Resp: 18  Temp: 98 F (36.7 C)  Weight: 164 lb 6.4 oz (74.6 kg)  Height: 5\' 3"  (1.6 m)   Body mass index is 29.12 kg/m. Physical Exam  Constitutional: She appears well-developed and well-nourished.  HENT:  Head: Normocephalic and atraumatic.  Eyes: Pupils are equal, round, and reactive to light. EOM are normal.  Neck: Normal range of motion. Neck supple. No JVD present. No thyromegaly present.  Cardiovascular: Normal rate and regular rhythm.  Pulmonary/Chest: She has no wheezes. She has no rales.  Abdominal: Soft. Bowel sounds are normal. She exhibits no distension. There is no tenderness.  Musculoskeletal: She exhibits no edema or tenderness.  Self transfer, ambulates with walker.   Neurological: She is alert. No cranial nerve deficit. She exhibits normal muscle tone. Coordination normal.  Oriented to self and her room on unit.   Skin: Skin is warm and dry.  Psychiatric:  Flat affect.     Labs reviewed: Recent Labs    07/13/16 1420 07/14/16 0638 07/15/16 0409 07/17/16 0627 07/24/16 08/13/16 12/17/16  NA 140 142 138 139 142 137 139  K 3.5 3.7 3.3* 3.8 3.6 3.7 3.6  CL 109 111 107 108  --   --   --   CO2 23 23 23 26   --   --   --   GLUCOSE 113* 94 88 97  --   --   --   BUN 10 7 9 10 11 14 12   CREATININE 0.99 1.01* 1.04* 0.91 1.1 1.0 1.0  CALCIUM 9.2 8.8* 9.0 9.0  --   --   --   MG 2.4  --   --   --   --   --   --   PHOS 2.3*  --   --   --   --   --   --    Recent Labs    07/13/16 1420 07/24/16 08/13/16 12/17/16  AST 25 15 17 17   ALT 16 13 16 14   ALKPHOS 110 92 108 101  BILITOT 0.8  --   --   --   PROT 6.8  --   --   --   ALBUMIN 4.2  --   --   --    Recent Labs     07/13/16 1420  07/14/16 0638 07/15/16 0409  08/13/16 12/17/16 02/12/17  WBC 8.2  --  5.8 6.5   < > 6.8 7.6 5.6  NEUTROABS 5.7  --   --   --   --   --   --   --   HGB 14.4  --  13.2 13.3   < > 13.5 12.8 13.6  HCT 43.9   < > 41.3 41.3   < > 41 38 40  MCV 94.2  --  97.9 95.6  --   --   --   --   PLT 213  --  210 191   < > 264 217 251   < > = values in this interval not displayed.   Lab Results  Component Value Date   TSH 1.56 02/12/2017   Lab Results  Component Value Date   HGBA1C 5.5 12/02/2016   Lab Results  Component Value Date   CHOL 174 10/21/2016   HDL 45 10/21/2016   LDLCALC 103 10/21/2016   TRIG 158 10/21/2016    Significant Diagnostic Results in last 30 days:  No results found.  Assessment/Plan Adult failure to thrive Per social worker/s conversation with the patient's POA daughter [name]: desires comfort measures, requested palliative care. Continue supportive care.   Depression due to dementia Better to manage her mood, increase Sertraline to 150mg , observe.   Vascular dementia with behavior disturbance HPI was provided with assistance of staff due to her dementia. She resides in memory care unit [facility], self transfer, ambulating with walker.   Psychosis Stable, continue Risperdal 0.25mg  bid po.      Family/ staff Communication: plan of care reviewed with the patient and charge nurse.    Labs/tests ordered:  None  Time spend 25 minutes.

## 2017-06-11 NOTE — Assessment & Plan Note (Signed)
Per social worker/s conversation with the patient's POA daughter [name]: desires comfort measures, requested palliative care. Continue supportive care.

## 2017-06-11 NOTE — Assessment & Plan Note (Signed)
Better to manage her mood, increase Sertraline to 150mg , observe.

## 2017-06-11 NOTE — Assessment & Plan Note (Signed)
HPI was provided with assistance of staff due to her dementia. She resides in memory care unit [facility], self transfer, ambulating with walker.

## 2017-06-18 ENCOUNTER — Encounter: Payer: Self-pay | Admitting: Nurse Practitioner

## 2017-06-18 ENCOUNTER — Non-Acute Institutional Stay (SKILLED_NURSING_FACILITY): Payer: Medicare Other | Admitting: Nurse Practitioner

## 2017-06-18 DIAGNOSIS — K219 Gastro-esophageal reflux disease without esophagitis: Secondary | ICD-10-CM | POA: Diagnosis not present

## 2017-06-18 DIAGNOSIS — F329 Major depressive disorder, single episode, unspecified: Secondary | ICD-10-CM | POA: Diagnosis not present

## 2017-06-18 DIAGNOSIS — I10 Essential (primary) hypertension: Secondary | ICD-10-CM

## 2017-06-18 DIAGNOSIS — F0280 Dementia in other diseases classified elsewhere without behavioral disturbance: Secondary | ICD-10-CM

## 2017-06-18 DIAGNOSIS — D6859 Other primary thrombophilia: Secondary | ICD-10-CM

## 2017-06-18 DIAGNOSIS — F29 Unspecified psychosis not due to a substance or known physiological condition: Secondary | ICD-10-CM

## 2017-06-18 DIAGNOSIS — F0393 Unspecified dementia, unspecified severity, with mood disturbance: Secondary | ICD-10-CM

## 2017-06-18 NOTE — Assessment & Plan Note (Signed)
Chronic anticoagulation therapy.

## 2017-06-18 NOTE — Assessment & Plan Note (Signed)
Improving, more facial expression, smile, and she is more conversing, continue Sertraline 175mg  qd.

## 2017-06-18 NOTE — Assessment & Plan Note (Signed)
Stable, continue Omeprazole 10mg qd.  

## 2017-06-18 NOTE — Assessment & Plan Note (Signed)
Controlled, continue Metoprolol 12.5mg bid.  

## 2017-06-18 NOTE — Progress Notes (Signed)
Location:  [place] Room Number: [place]:  SNF (31) Provider:  Habiba Treloar, ManXie  NP  Blanchie Serve, MD  Patient Care Team: Blanchie Serve, MD as PCP - General (Internal Medicine) Jabez Molner X, NP as Nurse Practitioner (Internal Medicine) Juluis Rainier as Consulting Physician (Optometry) Monna Fam, MD as Consulting Physician (Ophthalmology)  Extended Emergency Contact Information Primary Emergency Contact: [facility] Address: [address], [place] [address] [name] of [place] Phone: [phone] Work Phone: [phone] Mobile Phone: [phone] Relation: Daughter  Code Status:  DNR Goals of care: Advanced Directive information Advanced Directives 06/18/2017  Does Patient Have a Medical Advance Directive? Yes  Type of Paramedic of [place];Living will;Out of facility DNR (pink MOST or yellow form)  Does patient want to make changes to medical advance directive? No - Patient declined  Copy of [place] in Chart? Yes  Pre-existing out of facility DNR order (yellow form or pink MOST form) Yellow form placed in chart (order not valid for inpatient use)     Chief Complaint  Patient presents with  . Medical Management of Chronic Issues    F/U- dementia , failure to thrive,     HPI:  Pt is a 84 y.o. female seen today for medical management of chronic diseases.     The patient has history dementia, s/p CVA, psychosis, her mood is stabilizing since Sertraline was increased to 175mg  qd. She resides in memory care unit [facility], not taking memory preserving meds. Her psychosis is well managed on Risperdal 0.25mg  bid. Hx of HTN, blood pressure is controlled on Metoprolol 12.5mg  bid. Stable of constipation while taking MiraLax daily and prn Bisacodyl suppository qd. Her goal of care is comfort measures, desires palliative service.   Past Medical History:    Diagnosis Date  . Acute encephalopathy   . Aphasia following unspecified cerebrovascular disease   . 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 Depression   . Dysphagia following cerebrovascular disease   . Hepatic steatosis   . Hydroureteronephrosis   . Hypertension   . Insomnia   . Macular degeneration   . Major depressive disorder, recurrent ([place])   . Neuralgia and neuritis, unspecified (CODE)   . Nontraumatic intracranial hemorrhage ([place])   . Stroke ([place])   . 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   . 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 ABDOMINAL HYSTERECTOMY    . APPENDECTOMY    . BREAST SURGERY    . MASTECTOMY Right     Allergies  Allergen Reactions  . Hydrocodone Nausea And Vomiting  . Morphine And Related Nausea And Vomiting  . Actonel [Risedronate Sodium] Other (See Comments)    unknown  . Darvon [Propoxyphene Hcl] Other (See Comments)    unknown  . Oxycodone Hcl Other (See Comments)    unknown  . Pneumovax [Pneumococcal Polysaccharide Vaccine] Other (See Comments)    unknown    Outpatient Encounter Medications as of 06/18/2017  Medication Sig  . acetaminophen (TYLENOL) 325 MG tablet Take 650 mg by mouth every 4 (four) hours as needed for pain.  Marland Kitchen atorvastatin (LIPITOR) 10 MG tablet Take 10 mg by mouth daily.  . bisacodyl (DULCOLAX) 10 MG suppository Place 1 suppository (10 mg total) rectally daily as needed  for moderate constipation.  . fluticasone (FLONASE) 50 MCG/ACT nasal spray Place 1 spray into both nostrils as needed for allergies.   . hydrocortisone 2.5 % cream Apply 1 application topically 2 (two) times daily as needed. Apply to affected area on face  . metoprolol tartrate (LOPRESSOR) 25 MG tablet Take 12.5 mg by mouth 2 (two) times daily.   Marland Kitchen nystatin (NYSTATIN) powder Apply topically daily as needed. Under left breast   .  omeprazole (PRILOSEC) 20 MG capsule Take 10 mg by mouth daily.   . polyethylene glycol (MIRALAX / GLYCOLAX) packet Take 17 g by mouth daily.  . risperiDONE (RISPERDAL) 0.25 MG tablet Take 0.25 mg by mouth 2 (two) times daily.  . Rivaroxaban (XARELTO) 15 MG TABS tablet Take 1 tablet (15 mg total) by mouth daily at 6 PM.  . sertraline (ZOLOFT) 100 MG tablet Take 1 tablet (100 mg total) by mouth daily at 6 PM.  . sertraline (ZOLOFT) 50 MG tablet Take 50 mg by mouth daily. Take with 100 mg = 150 mg  . Simethicone (GAS RELIEF 80 PO) Take 80 mg by mouth 3 (three) times daily with meals.   . [DISCONTINUED] sertraline (ZOLOFT) 25 MG tablet Take 25 mg by mouth daily. Take along with 100 mg tablet to equal 125 mg.   No facility-administered encounter medications on file as of 06/18/2017.    ROS was provided with assistance of staff Review of Systems  Constitutional: Negative for activity change, appetite change, chills, diaphoresis, fatigue and fever.  HENT: Positive for hearing loss. Negative for congestion and voice change.   Respiratory: Negative for cough, shortness of breath and wheezing.   Cardiovascular: Negative for chest pain, palpitations and leg swelling.  Gastrointestinal: Negative for abdominal distention, abdominal pain, constipation, diarrhea, nausea and vomiting.  Genitourinary: Negative for difficulty urinating, dysuria and urgency.  Musculoskeletal: Positive for gait problem.  Skin: Negative for color change and pallor.  Neurological: Positive for speech difficulty. Negative for dizziness, facial asymmetry, weakness and headaches.       Dementia  Psychiatric/Behavioral: Positive for confusion. Negative for agitation, behavioral problems and sleep disturbance. The patient is not nervous/anxious.     Immunization History  Administered Date(s) Administered  . Influenza-Unspecified 11/20/2013, 09/26/2014, 10/24/2015   Pertinent  Health Maintenance Due  Topic Date Due  . DEXA SCAN   07/18/1994  . PNA vac Low Risk Adult (1 of 2 - PCV13) 07/18/1994  . INFLUENZA VACCINE  08/06/2017   Fall Risk  09/05/2016  Falls in the past year? No   Functional Status Survey:    Vitals:   06/18/17 0944  BP: 140/68  Pulse: 76  Resp: 18  Temp: 98 F (36.7 C)  SpO2: 97%  Weight: 164 lb 6.4 oz (74.6 kg)  Height: 5\' 3"  (1.6 m)   Body mass index is 29.12 kg/m. Physical Exam  Constitutional: She appears well-developed and well-nourished.  HENT:  Head: Normocephalic and atraumatic.  Eyes: Pupils are equal, round, and reactive to light. EOM are normal.  Neck: Normal range of motion. Neck supple. No JVD present. No thyromegaly present.  Cardiovascular: Normal rate and regular rhythm.  No murmur heard. Pulmonary/Chest: Effort normal and breath sounds normal. She has no wheezes. She has no rales.  Abdominal: Soft. She exhibits no distension. There is no tenderness.  Musculoskeletal: She exhibits no edema.  Self transfer, ambulates with walker  Neurological: She is alert. No cranial nerve deficit. She exhibits normal muscle tone. Coordination normal.  Oriented to self  and her room on unit.   Skin: Skin is warm and dry.  Psychiatric: She has a normal mood and affect. Her behavior is normal.    Labs reviewed: Recent Labs    07/13/16 1420 07/14/16 0638 07/15/16 0409 07/17/16 0627 07/24/16 08/13/16 12/17/16  NA 140 142 138 139 142 137 139  K 3.5 3.7 3.3* 3.8 3.6 3.7 3.6  CL 109 111 107 108  --   --   --   CO2 23 23 23 26   --   --   --   GLUCOSE 113* 94 88 97  --   --   --   BUN 10 7 9 10 11 14 12   CREATININE 0.99 1.01* 1.04* 0.91 1.1 1.0 1.0  CALCIUM 9.2 8.8* 9.0 9.0  --   --   --   MG 2.4  --   --   --   --   --   --   PHOS 2.3*  --   --   --   --   --   --    Recent Labs    07/13/16 1420 07/24/16 08/13/16 12/17/16  AST 25 15 17 17   ALT 16 13 16 14   ALKPHOS 110 92 108 101  BILITOT 0.8  --   --   --   PROT 6.8  --   --   --   ALBUMIN 4.2  --   --   --    Recent  Labs    07/13/16 1420  07/14/16 0638 07/15/16 0409  08/13/16 12/17/16 02/12/17  WBC 8.2  --  5.8 6.5   < > 6.8 7.6 5.6  NEUTROABS 5.7  --   --   --   --   --   --   --   HGB 14.4  --  13.2 13.3   < > 13.5 12.8 13.6  HCT 43.9   < > 41.3 41.3   < > 41 38 40  MCV 94.2  --  97.9 95.6  --   --   --   --   PLT 213  --  210 191   < > 264 217 251   < > = values in this interval not displayed.   Lab Results  Component Value Date   TSH 1.56 02/12/2017   Lab Results  Component Value Date   HGBA1C 5.5 12/02/2016   Lab Results  Component Value Date   CHOL 174 10/21/2016   HDL 45 10/21/2016   LDLCALC 103 10/21/2016   TRIG 158 10/21/2016    Significant Diagnostic Results in last 30 days:  No results found.  Assessment/Plan Hypertension Controlled, continue Metoprolol 12.5mg  bid.   GERD (gastroesophageal reflux disease) Stable, continue Omeprazole 10mg  qd.   Depression due to dementia Improving, more facial expression, smile, and she is more conversing, continue Sertraline 175mg  qd.   Protein C deficiency (HCC) Chronic anticoagulation therapy.   Psychosis Stable, continue Risperdal 0.25mg  bid po     Family/ staff Communication: plan of care reviewed with the patient and charge nurse.   Labs/tests ordered: none   Time spend 25 minutes.

## 2017-06-18 NOTE — Assessment & Plan Note (Signed)
Stable, continue Risperdal 0.25mg  bid po

## 2017-06-19 ENCOUNTER — Non-Acute Institutional Stay: Payer: Medicare Other | Admitting: Hospice and Palliative Medicine

## 2017-06-19 DIAGNOSIS — Z515 Encounter for palliative care: Secondary | ICD-10-CM

## 2017-06-22 NOTE — Progress Notes (Signed)
PALLIATIVE CARE CONSULT VISIT   PATIENT NAME: Erica [name] DOB: [birth year] MRN: [id number]  PRIMARY CARE PROVIDER: Blanchie Serve, MD  REFERRING PROVIDER: Blanchie Serve, MD [place], [place] [address]  RESPONSIBLE PARTY:  Daughter    RECOMMENDATIONS and PLAN:  1.Memory loss: resides in memory care at [facility]. Getting excellent care. She is now eating finger foods, due to difficulty using utensils. She is getting more fearful at times per staff and has had a decline in function. No weight loss currently but was just taken off Remeron? May want to weigh weekly. She can still speak with several words and still able to feed with prompting and with finger foods on her own.  2. Poor eye sight: perhaps contrasting colored plates for better vision of her food. 3. Depression/aniety: will meet with daughter to explore what's been tried and discuss the Remeron. 4. ACP: DNR form displayed. No MOST form found. Will discuss disease trajectory, explain palliative care vs hospice services and outline goals of care.   I spent 30 minutes providing this consultation,  from 3:00 to 3:30pm. More than 50% of the time in this consultation was spent gathering information, reviewing medical records, interviewing staff and coordinating communication.   HISTORY OF PRESENT ILLNESS:  Erica [name] is an 84 y.o.  female with multiple medical problems including worsening dementia with hx of CVA. Palliative Care was asked to help address symptom management and goals of care.   CODE STATUS: DNR  PPS: 40 to weak 50% HOSPICE ELIGIBILITY/DIAGNOSIS: TBD  PAST MEDICAL HISTORY:  Past Medical History:  Diagnosis Date  . Acute encephalopathy   . Aphasia following unspecified cerebrovascular disease   . 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 Depression   . Dysphagia following cerebrovascular disease   . Hepatic steatosis   . Hydroureteronephrosis   . Hypertension   . Insomnia   .  Macular degeneration   . Major depressive disorder, recurrent ([place])   . Neuralgia and neuritis, unspecified (CODE)   . Nontraumatic intracranial hemorrhage ([place])   . Stroke ([place])   . 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   . 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 Smoking status: Never Smoker  . Smokeless tobacco: Never Used  Substance Use Topics  . Alcohol use: No    ALLERGIES:  Allergies  Allergen Reactions  . Hydrocodone Nausea And Vomiting  . Morphine And Related Nausea And Vomiting  . Actonel [Risedronate Sodium] Other (See Comments)    unknown  . Darvon [Propoxyphene Hcl] Other (See Comments)    unknown  . Oxycodone Hcl Other (See Comments)    unknown  . Pneumovax [Pneumococcal Polysaccharide Vaccine] Other (See Comments)    unknown     PERTINENT MEDICATIONS:  Outpatient Encounter Medications as of 06/19/2017  Medication Sig  . acetaminophen (TYLENOL) 325 MG tablet Take 650 mg by mouth every 4 (four) hours as needed for pain.  Marland Kitchen atorvastatin (LIPITOR) 10 MG tablet Take 10 mg by mouth daily.  . bisacodyl (DULCOLAX) 10 MG suppository Place 1 suppository (10 mg total) rectally daily as needed for moderate constipation.  . fluticasone (FLONASE) 50 MCG/ACT nasal spray Place 1 spray into both nostrils as needed for allergies.   . hydrocortisone 2.5 % cream Apply 1 application topically 2 (two) times daily as needed. Apply to affected area on  face  . metoprolol tartrate (LOPRESSOR) 25 MG tablet Take 12.5 mg by mouth 2 (two) times daily.   Marland Kitchen nystatin (NYSTATIN) powder Apply topically daily as needed. Under left breast   . omeprazole (PRILOSEC) 20 MG capsule Take 10 mg by mouth daily.   . polyethylene glycol (MIRALAX / GLYCOLAX) packet Take 17 g by mouth daily.  . risperiDONE (RISPERDAL) 0.25 MG tablet Take 0.25 mg by mouth 2 (two) times daily.  . Rivaroxaban (XARELTO) 15 MG  TABS tablet Take 1 tablet (15 mg total) by mouth daily at 6 PM.  . sertraline (ZOLOFT) 100 MG tablet Take 1 tablet (100 mg total) by mouth daily at 6 PM.  . sertraline (ZOLOFT) 50 MG tablet Take 50 mg by mouth daily. Take with 100 mg = 150 mg  . Simethicone (GAS RELIEF 80 PO) Take 80 mg by mouth 3 (three) times daily with meals.    No facility-administered encounter medications on file as of 06/19/2017.     PHYSICAL EXAM:   General: alert, Caucasian female lying in bed awake but calm Cardiovascular: irreg rhythm; reg rate Pulmonary: CTAB Abdomen: soft, active BS; NTTP Extremities: using all four Skin: thin, easily bruised Neurological: alert; speaks in sentences but word searches  Nathanial Rancher, NP

## 2017-06-25 ENCOUNTER — Non-Acute Institutional Stay: Payer: Medicare Other | Admitting: Hospice and Palliative Medicine

## 2017-06-25 DIAGNOSIS — Z515 Encounter for palliative care: Secondary | ICD-10-CM

## 2017-06-27 NOTE — Progress Notes (Signed)
PALLIATIVE CARE CONSULT VISIT   PATIENT NAME: Yamina Lenis DOB: 06-26-1927 MRN: [id number]  PRIMARY CARE PROVIDER: Blanchie Serve, MD  REFERRING PROVIDER: Blanchie Serve, MD [place], [place] [address]  RESPONSIBLE PARTY:   daughter   RECOMMENDATIONS and PLAN:  1.weakness: secondary to age of 84 with chronic conditions and worsening dementia. Continue memory care setting. She is getting excellent care. Continues safety measures. PT/OT if able. 2. Anxiety: not certain it is well controlled. Need clarification on DC'ing of Remeron. Will discuss with daughter. Currently getting an increased dose of Sertraline and low dose Risperdal. 3. ACP: DNR form on chart. Want to meet with daughter to discuss MOST form.   I spent 15 minutes providing this consultation,  from 3:45pm  to 4:00pm. More than 50% of the time in this consultation was spent interviewing staff, assessing patient and coordinating communication.   HISTORY OF PRESENT ILLNESS:  Erica [name] is an 84 y.o.  female with multiple medical problems and worsening dementia. She resides in the memory care unit at [facility]. I have not been able to communicate with patient's daughter, so checked on patient again today hoping to see patient's daughter there. Daughter works at this facility.Daughter not present. Have left another message to discuss my findings.   CODE STATUS: DNR  PPS: weak 40 % has pink rolling seated walker HOSPICE ELIGIBILITY/DIAGNOSIS: TBD  PAST MEDICAL HISTORY:  Past Medical History:  Diagnosis Date  . Acute encephalopathy   . Aphasia following unspecified cerebrovascular disease   . 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 Depression   . Dysphagia following cerebrovascular disease   . Hepatic steatosis   . Hydroureteronephrosis   . Hypertension   . Insomnia   . Macular degeneration   . Major depressive disorder, recurrent ([place])   . Neuralgia and neuritis, unspecified (CODE)   .  Nontraumatic intracranial hemorrhage ([place])   . Stroke ([place])   . 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   . 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 Smoking status: Never Smoker  . Smokeless tobacco: Never Used  Substance Use Topics  . Alcohol use: No    ALLERGIES:  Allergies  Allergen Reactions  . Hydrocodone Nausea And Vomiting  . Morphine And Related Nausea And Vomiting  . Actonel [Risedronate Sodium] Other (See Comments)    unknown  . Darvon [Propoxyphene Hcl] Other (See Comments)    unknown  . Oxycodone Hcl Other (See Comments)    unknown  . Pneumovax [Pneumococcal Polysaccharide Vaccine] Other (See Comments)    unknown     PERTINENT MEDICATIONS:  Outpatient Encounter Medications as of 06/25/2017  Medication Sig  . acetaminophen (TYLENOL) 325 MG tablet Take 650 mg by mouth every 4 (four) hours as needed for pain.  Marland Kitchen atorvastatin (LIPITOR) 10 MG tablet Take 10 mg by mouth daily.  . bisacodyl (DULCOLAX) 10 MG suppository Place 1 suppository (10 mg total) rectally daily as needed for moderate constipation.  . fluticasone (FLONASE) 50 MCG/ACT nasal spray Place 1 spray into both nostrils as needed for allergies.   . hydrocortisone 2.5 % cream Apply 1 application topically 2 (two) times daily as needed. Apply to affected area on face  . metoprolol tartrate (LOPRESSOR) 25 MG tablet Take 12.5 mg by mouth 2 (two) times daily.   Marland Kitchen nystatin (NYSTATIN) powder Apply topically  daily as needed. Under left breast   . omeprazole (PRILOSEC) 20 MG capsule Take 10 mg by mouth daily.   . polyethylene glycol (MIRALAX / GLYCOLAX) packet Take 17 g by mouth daily.  . risperiDONE (RISPERDAL) 0.25 MG tablet Take 0.25 mg by mouth 2 (two) times daily.  . Rivaroxaban (XARELTO) 15 MG TABS tablet Take 1 tablet (15 mg total) by mouth daily at 6 PM.  . sertraline (ZOLOFT) 100 MG tablet Take 1 tablet (100 mg  total) by mouth daily at 6 PM.  . sertraline (ZOLOFT) 50 MG tablet Take 50 mg by mouth daily. Take with 100 mg = 150 mg  . Simethicone (GAS RELIEF 80 PO) Take 80 mg by mouth 3 (three) times daily with meals.    No facility-administered encounter medications on file as of 06/25/2017.     PHYSICAL EXAM:   General: alert, anxious appearing female sitting in community room in chair Cardiovascular: irreg rhythm; reg rate Pulmonary: CTAB; pursed breathing I think due to anxiety Abdomen: soft, active BS Extremities: trace edema bilat legs; weak upon standing Skin: thin, easily bruised Neurological: alert; word searches; a little frightened at first  Nathanial Rancher, NP

## 2017-06-29 ENCOUNTER — Telehealth: Payer: Self-pay

## 2017-06-29 NOTE — Telephone Encounter (Signed)
Phone call placed to patient 's daughter to offer to schedule a visit with Palliative Care NP. VM left

## 2017-06-29 NOTE — Telephone Encounter (Signed)
VM left for daughter on cell phone to offer to schedule a meeting with Palliative Care NP.

## 2017-07-13 ENCOUNTER — Encounter: Payer: Self-pay | Admitting: Nurse Practitioner

## 2017-07-13 ENCOUNTER — Non-Acute Institutional Stay (SKILLED_NURSING_FACILITY): Payer: Medicare Other | Admitting: Nurse Practitioner

## 2017-07-13 DIAGNOSIS — Z7901 Long term (current) use of anticoagulants: Secondary | ICD-10-CM | POA: Diagnosis not present

## 2017-07-13 DIAGNOSIS — F29 Unspecified psychosis not due to a substance or known physiological condition: Secondary | ICD-10-CM

## 2017-07-13 DIAGNOSIS — K5909 Other constipation: Secondary | ICD-10-CM

## 2017-07-13 DIAGNOSIS — K219 Gastro-esophageal reflux disease without esophagitis: Secondary | ICD-10-CM

## 2017-07-13 DIAGNOSIS — F0393 Unspecified dementia, unspecified severity, with mood disturbance: Secondary | ICD-10-CM

## 2017-07-13 DIAGNOSIS — F329 Major depressive disorder, single episode, unspecified: Secondary | ICD-10-CM | POA: Diagnosis not present

## 2017-07-13 DIAGNOSIS — I10 Essential (primary) hypertension: Secondary | ICD-10-CM

## 2017-07-13 DIAGNOSIS — F0280 Dementia in other diseases classified elsewhere without behavioral disturbance: Secondary | ICD-10-CM | POA: Diagnosis not present

## 2017-07-13 NOTE — Assessment & Plan Note (Signed)
Stable, continue Omeprazole 20mg qd.  

## 2017-07-13 NOTE — Assessment & Plan Note (Addendum)
Stable, continue Risperdal 0.25mg  po bid. Update CBC and CMP

## 2017-07-13 NOTE — Assessment & Plan Note (Signed)
Mood is stable, continue Sertraline 150mg  qd, Mirtazapine 7.5mg  daily

## 2017-07-13 NOTE — Assessment & Plan Note (Signed)
Stable, continue MiraLax daily.  

## 2017-07-13 NOTE — Assessment & Plan Note (Signed)
Blood pressure is controlled, continue Metoprolol 12.5mg bid.  

## 2017-07-13 NOTE — Assessment & Plan Note (Signed)
She has protein C/S deficiency, hx of DVT, s/p CVA, continue  Xarelto 15mg  po daily.

## 2017-07-13 NOTE — Progress Notes (Signed)
Location:  [place] Room Number: [place]:  SNF (31) Provider:  Laini Urick, ManXie  NP  Blanchie Serve, MD  Patient Care Team: Blanchie Serve, MD as PCP - General (Internal Medicine) Jonnell Hentges X, NP as Nurse Practitioner (Internal Medicine) Juluis Rainier as Consulting Physician (Optometry) Monna Fam, MD as Consulting Physician (Ophthalmology) Lucky Cowboy, Gordy Clement, NP (Inactive) as Nurse Practitioner [facility])  Extended Emergency Contact Information Primary Emergency Contact: [facility] Address: [address], [place] [address] [name] of [place] Phone: [phone] Work Phone: [phone] Mobile Phone: [phone] Relation: Daughter  Code Status:  DNR Goals of care: Advanced Directive information Advanced Directives 07/13/2017  Does Patient Have a Medical Advance Directive? Yes  Type of Advance Directive Out of facility DNR (pink MOST or yellow form);[place];Living will  Does patient want to make changes to medical advance directive? No - Patient declined  Copy of [place] in Chart? Yes  Pre-existing out of facility DNR order (yellow form or pink MOST form) Yellow form placed in chart (order not valid for inpatient use)     Chief Complaint  Patient presents with  . Medical Management of Chronic Issues    F/U- HTN, GERD, depression due to dementia,    HPI:  Pt is a 84 y.o. female seen today for medical management of chronic diseases.     The patient has history of dementia, resides in Erica [name] unit [facility], ambulates with walker, not on memory preserving meds. Her mood is stabilized on Sertraline 150mg  daily, resumed Mirtazapine 7.5mg  since 06/25/17. Her psychosis is managed with Risperdal 0.25mg  bid. No constipation while on MiraLax daily. Hx of HTN, blood pressure is controlled on Metoprolol 12.5mg  bid. GERD stable on Omeprazole  20mg  qd. Chronic anticoagulation for Hx of DVT, protein C/S deficiency, on Xarelto 15mg  qd.   Past Medical History:  Diagnosis Date  . Acute encephalopathy   . Aphasia following unspecified cerebrovascular disease   . 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 Depression   . Dysphagia following cerebrovascular disease   . Hepatic steatosis   . Hydroureteronephrosis   . Hypertension   . Insomnia   . Macular degeneration   . Major depressive disorder, recurrent ([place])   . Neuralgia and neuritis, unspecified (CODE)   . Nontraumatic intracranial hemorrhage ([place])   . Stroke ([place])   . 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   . 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 ABDOMINAL HYSTERECTOMY    . APPENDECTOMY    . BREAST SURGERY    . MASTECTOMY Right     Allergies  Allergen Reactions  . Hydrocodone Nausea And Vomiting  . Morphine And Related Nausea And Vomiting  . Actonel [Risedronate Sodium] Other (See Comments)    unknown  . Darvon [Propoxyphene Hcl] Other (See Comments)    unknown  . Oxycodone Hcl Other (See Comments)    unknown  . Pneumovax [Pneumococcal Polysaccharide Vaccine] Other (See Comments)    unknown    Outpatient Encounter Medications as of 07/13/2017  Medication Sig  . acetaminophen (TYLENOL) 325 MG tablet Take 650 mg by mouth every 4 (four) hours as needed for pain.  Marland Kitchen atorvastatin (LIPITOR) 10 MG tablet Take 10 mg by mouth daily.  Marland Kitchen  bisacodyl (DULCOLAX) 10 MG suppository Place 1 suppository (10 mg total) rectally daily as needed for moderate constipation.  . fluticasone (FLONASE) 50 MCG/ACT nasal spray Place 1 spray into both nostrils as needed for allergies.   . hydrocortisone 2.5 % cream Apply 1 application topically 2 (two) times daily as needed. Apply to affected area on face  . metoprolol tartrate (LOPRESSOR) 25 MG tablet Take 12.5 mg by  mouth 2 (two) times daily.   . mirtazapine (REMERON) 15 MG tablet Take 7.5 mg by mouth at bedtime.   Marland Kitchen nystatin (NYSTATIN) powder Apply topically daily as needed. Under left breast   . omeprazole (PRILOSEC) 20 MG capsule Take 10 mg by mouth daily.   . polyethylene glycol (MIRALAX / GLYCOLAX) packet Take 17 g by mouth daily.  . risperiDONE (RISPERDAL) 0.25 MG tablet Take 0.25 mg by mouth 2 (two) times daily.  . Rivaroxaban (XARELTO) 15 MG TABS tablet Take 1 tablet (15 mg total) by mouth daily at 6 PM.  . sertraline (ZOLOFT) 100 MG tablet Take 1 tablet (100 mg total) by mouth daily at 6 PM.  . sertraline (ZOLOFT) 50 MG tablet Take 50 mg by mouth daily. Take with 100 mg = 150 mg  . Simethicone (GAS RELIEF 80 PO) Take 80 mg by mouth 3 (three) times daily with meals.    No facility-administered encounter medications on file as of 07/13/2017.    ROS was provided with assistance of staff Review of Systems  Constitutional: Negative for activity change, appetite change, chills, diaphoresis, fatigue and fever.  HENT: Positive for hearing loss. Negative for trouble swallowing and voice change.   Respiratory: Negative for cough, shortness of breath and wheezing.   Cardiovascular: Negative for chest pain, palpitations and leg swelling.  Gastrointestinal: Negative for abdominal distention, abdominal pain, constipation, diarrhea, nausea and vomiting.  Genitourinary: Negative for difficulty urinating, dysuria and urgency.  Musculoskeletal: Positive for gait problem.  Skin: Negative for color change and pallor.  Neurological: Positive for speech difficulty. Negative for dizziness and headaches.       Dementia  Psychiatric/Behavioral: Positive for confusion. Negative for agitation, behavioral problems, hallucinations and sleep disturbance. The patient is not nervous/anxious.     Immunization History  Administered Date(s) Administered  . Influenza-Unspecified 11/20/2013, 09/26/2014, 10/24/2015, 11/03/2016    . Tdap 09/19/2016   Pertinent  Health Maintenance Due  Topic Date Due  . DEXA SCAN  07/18/1994  . INFLUENZA VACCINE  08/06/2017   Fall Risk  09/05/2016  [name] in the past year? No   Functional Status Survey:    Vitals:   07/13/17 1130  BP: 134/72  Pulse: 64  Resp: 18  Temp: 98 F (36.7 C)  Weight: 166 lb 6.4 oz (75.5 kg)  Height: 5\' 3"  (1.6 m)   Body mass index is 29.48 kg/m. Physical Exam  Constitutional: She appears well-developed and well-nourished.  HENT:  Head: Normocephalic and atraumatic.  Eyes: Pupils are equal, round, and reactive to light. EOM are normal.  Neck: Normal range of motion. Neck supple. No JVD present. No thyromegaly present.  Cardiovascular: Normal rate and regular rhythm.  No murmur heard. Pulmonary/Chest: She has no wheezes. She has no rales.  Abdominal: Soft. Bowel sounds are normal. She exhibits no distension. There is no tenderness.  Musculoskeletal: She exhibits no edema or tenderness.  Trace edema ankles. Self transfer. Ambulates with walker.   Neurological: She is alert. No cranial nerve deficit. She exhibits normal muscle tone. Coordination normal.  Oriented to self and  her room on unit.   Skin: Skin is warm and dry.  Psychiatric: She has a normal mood and affect. Her behavior is normal.    Labs reviewed: Recent Labs    07/14/16 0638 07/15/16 0409 07/17/16 0627 07/24/16 08/13/16 12/17/16  NA 142 138 139 142 137 139  K 3.7 3.3* 3.8 3.6 3.7 3.6  CL 111 107 108  --   --   --   CO2 23 23 26   --   --   --   GLUCOSE 94 88 97  --   --   --   BUN 7 9 10 11 14 12   CREATININE 1.01* 1.04* 0.91 1.1 1.0 1.0  CALCIUM 8.8* 9.0 9.0  --   --   --    Recent Labs    07/24/16 08/13/16 12/17/16  AST 15 17 17   ALT 13 16 14   ALKPHOS 92 108 101   Recent Labs    07/14/16 0638 07/15/16 0409  08/13/16 12/17/16 02/12/17  WBC 5.8 6.5   < > 6.8 7.6 5.6  HGB 13.2 13.3   < > 13.5 12.8 13.6  HCT 41.3 41.3   < > 41 38 40  MCV 97.9 95.6  --   --    --   --   PLT 210 191   < > 264 217 251   < > = values in this interval not displayed.   Lab Results  Component Value Date   TSH 1.56 02/12/2017   Lab Results  Component Value Date   HGBA1C 5.5 12/02/2016   Lab Results  Component Value Date   CHOL 174 10/21/2016   HDL 45 10/21/2016   LDLCALC 103 10/21/2016   TRIG 158 10/21/2016    Significant Diagnostic Results in last 30 days:  No results found.  Assessment/Plan Chronic anticoagulation She has protein C/S deficiency, hx of DVT, s/p CVA, continue  Xarelto 15mg  po daily.   Hypertension Blood pressure is controlled, continue Metoprolol 12.5mg  bid.   GERD (gastroesophageal reflux disease) Stable, continue Omeprazole 20mg  qd.   Depression due to dementia Mood is stable, continue Sertraline 150mg  qd, Mirtazapine 7.5mg  daily  Psychosis Stable, continue Risperdal 0.25mg  po bid. Update CBC and CMP  Chronic constipation Stable, continue MiraLax daily.      Family/ staff Communication: plan of care reviewed with the patient and charge nurse.   Labs/tests ordered: CBC CMP  Time spend 25 minutes.

## 2017-07-14 LAB — CBC AND DIFFERENTIAL
HCT: 44 (ref 36–46)
Hemoglobin: 14.7 (ref 12.0–16.0)
Platelets: 260 (ref 150–399)
WBC: 7.7

## 2017-07-14 LAB — CMP 10231
Albumin: 4
CALCIUM: 8.9
CHLORIDE: 112
Carbon Dioxide, Total: 22
GFR CALC NON AF AMER: 53
Globulin: 2
TOTAL PROTEIN: 6

## 2017-07-14 LAB — BASIC METABOLIC PANEL
BUN: 11 (ref 4–21)
Creatinine: 1 (ref 0.5–1.1)
GLUCOSE: 119
POTASSIUM: 3.6 (ref 3.4–5.3)
SODIUM: 142 (ref 137–147)

## 2017-07-14 LAB — HEPATIC FUNCTION PANEL
ALT: 20 (ref 7–35)
AST: 18 (ref 13–35)
Alkaline Phosphatase: 87 (ref 25–125)
Bilirubin, Total: 0.4

## 2017-08-07 ENCOUNTER — Non-Acute Institutional Stay (SKILLED_NURSING_FACILITY): Payer: Medicare Other | Admitting: Internal Medicine

## 2017-08-07 ENCOUNTER — Encounter: Payer: Self-pay | Admitting: Internal Medicine

## 2017-08-07 DIAGNOSIS — I10 Essential (primary) hypertension: Secondary | ICD-10-CM

## 2017-08-07 DIAGNOSIS — J309 Allergic rhinitis, unspecified: Secondary | ICD-10-CM

## 2017-08-07 DIAGNOSIS — K219 Gastro-esophageal reflux disease without esophagitis: Secondary | ICD-10-CM | POA: Diagnosis not present

## 2017-08-07 DIAGNOSIS — F0151 Vascular dementia with behavioral disturbance: Secondary | ICD-10-CM

## 2017-08-07 DIAGNOSIS — I8291 Chronic embolism and thrombosis of unspecified vein: Secondary | ICD-10-CM | POA: Diagnosis not present

## 2017-08-07 DIAGNOSIS — K5909 Other constipation: Secondary | ICD-10-CM | POA: Diagnosis not present

## 2017-08-07 DIAGNOSIS — F01518 Vascular dementia, unspecified severity, with other behavioral disturbance: Secondary | ICD-10-CM

## 2017-08-07 DIAGNOSIS — F331 Major depressive disorder, recurrent, moderate: Secondary | ICD-10-CM

## 2017-08-07 NOTE — Progress Notes (Signed)
Location:  [place] Room Number: [place]:  SNF [phone]) Provider:  Blanchie Serve MD  Blanchie Serve, MD  Patient Care Team: Blanchie Serve, MD as PCP - General (Internal Medicine) Mast, Man X, NP as Nurse Practitioner (Internal Medicine) Juluis Rainier as Consulting Physician (Optometry) Monna Fam, MD as Consulting Physician (Ophthalmology) Lucky Cowboy, Gordy Clement, NP (Inactive) as Nurse Practitioner [facility] and Palliative Medicine)  Extended Emergency Contact Information Primary Emergency Contact: [facility] Address: [address], [place] [address] [name] of [place] Phone: [phone] Work Phone: [phone] Mobile Phone: [phone] Relation: Daughter  Code Status:  DNR  Goals of care: Advanced Directive information Advanced Directives 08/07/2017  Does Patient Have a Medical Advance Directive? Yes  Type of Paramedic of [place];Living will;Out of facility DNR (pink MOST or yellow form)  Does patient want to make changes to medical advance directive? No - Patient declined  Copy of [place] in Chart? Yes  Pre-existing out of facility DNR order (yellow form or pink MOST form) Yellow form placed in chart (order not valid for inpatient use)     Chief Complaint  Patient presents with  . Medical Management of Chronic Issues    Routine Visit     HPI:  Pt is a 84 y.o. female seen today for medical management of chronic diseases.    GERD- with Flatulence. simethicone has been helpful per nursing. No nausea or vomiting, oral intake is fair. Takes omeprazole 10 mg daily  Chronic depression- with Poor appetite. Out of bed daily.  on remeron 7.5 mg daily and sertraline 150 mg daily.   Chronic constipation- takes miralax daily and prn dulcolax suppository  Hypertension- takes metoprolol tartrate 12.5 mg bid  Dementia with behavioral disturbance-  takes sertraline with risperdal. Mood overall stable per nursing, needs redirection.   Chronic embolism- on rivaroxaban 15 mg daily for stroke prevention. Also on statin with history of CVA. Has history of protein c and s deficiency  Allergic rhinitis- Nasal congestion with runny nose, clear nasal discharge, on flonase daily as needed    Past Medical History:  Diagnosis Date  . Acute encephalopathy   . Aphasia following unspecified cerebrovascular disease   . 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 Depression   . Dysphagia following cerebrovascular disease   . Hepatic steatosis   . Hydroureteronephrosis   . Hypertension   . Insomnia   . Macular degeneration   . Major depressive disorder, recurrent ([place])   . Neuralgia and neuritis, unspecified (CODE)   . Nontraumatic intracranial hemorrhage ([place])   . Stroke ([place])   . 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   . 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 ABDOMINAL HYSTERECTOMY    . APPENDECTOMY    . BREAST SURGERY    . MASTECTOMY Right     Allergies  Allergen Reactions  . Hydrocodone Nausea And Vomiting  . Morphine And Related Nausea And Vomiting  . Actonel [Risedronate Sodium] Other (See Comments)    unknown  . Darvon [Propoxyphene Hcl] Other (See Comments)    unknown  . Oxycodone Hcl Other (See Comments)    unknown  . Pneumovax [Pneumococcal Polysaccharide Vaccine] Other (See Comments)    unknown    Outpatient Encounter Medications as  of 08/07/2017  Medication Sig  . acetaminophen (TYLENOL) 325 MG tablet Take 650 mg by mouth every 4 (four) hours as needed for pain.  Marland Kitchen atorvastatin (LIPITOR) 10 MG tablet Take 10 mg by mouth daily.  . bisacodyl (DULCOLAX) 10 MG suppository Place 1 suppository (10 mg total) rectally daily as needed for moderate constipation.  . fluticasone (FLONASE) 50 MCG/ACT nasal  spray Place 1 spray into both nostrils as needed for allergies.   . hydrocortisone 2.5 % cream Apply 1 application topically 2 (two) times daily as needed. Apply to affected area on face  . metoprolol tartrate (LOPRESSOR) 25 MG tablet Take 12.5 mg by mouth 2 (two) times daily.   . mirtazapine (REMERON) 15 MG tablet Take 7.5 mg by mouth at bedtime.   Marland Kitchen nystatin (NYSTATIN) powder Apply topically daily as needed. Under left breast   . omeprazole (PRILOSEC) 20 MG capsule Take 10 mg by mouth daily.   . polyethylene glycol (MIRALAX / GLYCOLAX) packet Take 17 g by mouth daily.  . risperiDONE (RISPERDAL) 0.25 MG tablet Take 0.25 mg by mouth 2 (two) times daily.  . Rivaroxaban (XARELTO) 15 MG TABS tablet Take 1 tablet (15 mg total) by mouth daily at 6 PM.  . sertraline (ZOLOFT) 100 MG tablet Take 1 tablet (100 mg total) by mouth daily at 6 PM.  . sertraline (ZOLOFT) 50 MG tablet Take 50 mg by mouth daily. Take with 100 mg = 150 mg  . Simethicone (GAS RELIEF 80 PO) Take 80 mg by mouth 3 (three) times daily with meals.    No facility-administered encounter medications on file as of 08/07/2017.     Review of Systems  Unable to perform ROS: Dementia  Constitutional: Negative for appetite change, chills and fever.  HENT: Positive for congestion and hearing loss. Negative for mouth sores, rhinorrhea, sore throat and trouble swallowing.   Eyes: Positive for visual disturbance.  Respiratory: Negative for cough and shortness of breath.   Cardiovascular: Positive for leg swelling. Negative for chest pain and palpitations.  Gastrointestinal: Negative for abdominal pain, constipation, diarrhea, nausea and vomiting.  Genitourinary: Negative for dysuria.  Musculoskeletal: Positive for gait problem.  Skin: Negative for rash and wound.    Immunization History  Administered Date(s) Administered  . Influenza-Unspecified 11/20/2013, 09/26/2014, 10/24/2015, 11/03/2016  . Tdap 09/19/2016   Pertinent  Health  Maintenance Due  Topic Date Due  . DEXA SCAN  07/18/1994  . INFLUENZA VACCINE  08/06/2017   Fall Risk  09/05/2016  Falls in the past year? No   Functional Status Survey:    Vitals:   08/07/17 1158  BP: 136/70  Pulse: 68  Resp: 16  Temp: 98.8 F (37.1 C)  TempSrc: Oral  SpO2: 97%  Weight: 163 lb 6.4 oz (74.1 kg)  Height: 5\' 3"  (1.6 m)   Body mass index is 28.95 kg/m.   Wt Readings from Last 3 Encounters:  08/07/17 163 lb 6.4 oz (74.1 kg)  07/13/17 166 lb 6.4 oz (75.5 kg)  06/18/17 164 lb 6.4 oz (74.6 kg)   Physical Exam  Constitutional: No distress.  overweight  HENT:  Head: Normocephalic and atraumatic.  Mouth/Throat: Oropharynx is clear and moist.  Clear nasal discharge  Eyes: Pupils are equal, round, and reactive to light. Conjunctivae and EOM are normal.  Corrective glasses  Neck: Normal range of motion. Neck supple.  Cardiovascular: Normal rate and regular rhythm.  Pulmonary/Chest: Effort normal and breath sounds normal. She has no wheezes. She has no rales.  Abdominal: There is no tenderness. There is no guarding.  Musculoskeletal: She exhibits edema.  Can move all 4 extremities, uses walker, trace edema  Lymphadenopathy:    She has no cervical adenopathy.  Neurological: She is alert.  Oriented to self  Skin: Skin is warm and dry. She is not diaphoretic.  Psychiatric:  Calm and pleasantly confused    Labs reviewed: Recent Labs    08/13/16 12/17/16 07/14/17  NA 137 139 142  K 3.7 3.6 3.6  CL  --   --  112  CO2  --   --  22  BUN 14 12 11   CREATININE 1.0 1.0 1.0  CALCIUM  --   --  8.9   Recent Labs    08/13/16 12/17/16 07/14/17  AST 17 17 18   ALT 16 14 20   ALKPHOS 108 101 87  PROT  --   --  6.0  ALBUMIN  --   --  4.0   Recent Labs    12/17/16 02/12/17 07/14/17  WBC 7.6 5.6 7.7  HGB 12.8 13.6 14.7  HCT 38 40 44  PLT 217 251 260   Lab Results  Component Value Date   TSH 1.56 02/12/2017   Lab Results  Component Value Date   HGBA1C  5.5 12/02/2016   Lab Results  Component Value Date   CHOL 174 10/21/2016   HDL 45 10/21/2016   LDLCALC 103 10/21/2016   TRIG 158 10/21/2016    Significant Diagnostic Results in last 30 days:  No results found.  Assessment/Plan  1. Chronic embolism and thrombosis of unspecified vein Continue rivaroxaban and monitor. Stable h&h  2. Essential hypertension, benign Continue metoprolol tartrate, monitor BP, bmp reviewed  3. Gastroesophageal reflux disease without esophagitis Continue oemprazole 10 mg daily and simethicone tid.   4. Chronic constipation Continue miralax and prn dulcolax suppository  5. Vascular dementia with behavior disturbance Continue statin. Continue risperdal and sertraline  6. Moderate episode of recurrent major depressive disorder (HCC) Continue sertraline and remeron  7. Allergic rhinitis, unspecified seasonality, unspecified trigger Change flonase to bid x 1 week and then daily   Family/ staff Communication: reviewed care plan with patient and charge nurse.    Labs/tests ordered:  none   Blanchie Serve, MD Internal Medicine [facility] [address] [place], [place] [address] Cell Phone (Monday-Friday 8 am - 5 pm): [phone] On Call: [phone] and follow prompts after 5 pm and on weekends Office Phone: [phone] Office Fax: [phone]

## 2017-08-07 NOTE — Patient Instructions (Signed)
Opened in error

## 2017-08-07 NOTE — Progress Notes (Signed)
Opened in error

## 2017-08-19 ENCOUNTER — Encounter: Payer: Self-pay | Admitting: Internal Medicine

## 2017-09-11 ENCOUNTER — Non-Acute Institutional Stay (SKILLED_NURSING_FACILITY): Payer: Medicare Other | Admitting: Nurse Practitioner

## 2017-09-11 ENCOUNTER — Encounter: Payer: Self-pay | Admitting: Nurse Practitioner

## 2017-09-11 DIAGNOSIS — F0393 Unspecified dementia, unspecified severity, with mood disturbance: Secondary | ICD-10-CM

## 2017-09-11 DIAGNOSIS — I10 Essential (primary) hypertension: Secondary | ICD-10-CM | POA: Diagnosis not present

## 2017-09-11 DIAGNOSIS — K219 Gastro-esophageal reflux disease without esophagitis: Secondary | ICD-10-CM

## 2017-09-11 DIAGNOSIS — D6859 Other primary thrombophilia: Secondary | ICD-10-CM

## 2017-09-11 DIAGNOSIS — K5909 Other constipation: Secondary | ICD-10-CM

## 2017-09-11 DIAGNOSIS — F329 Major depressive disorder, single episode, unspecified: Secondary | ICD-10-CM

## 2017-09-11 DIAGNOSIS — F0280 Dementia in other diseases classified elsewhere without behavioral disturbance: Secondary | ICD-10-CM

## 2017-09-11 NOTE — Progress Notes (Signed)
Location:   SNF [place] Room Number: 703 [id number] of Service:  SNF (31) Provider: Lennie Odor Bryam Taborda NP  Blanchie Serve, MD  Patient Care Team: Blanchie Serve, MD as PCP - General (Internal Medicine) Sydnee Lamour X, NP as Nurse Practitioner (Internal Medicine) Juluis Rainier as Consulting Physician (Optometry) Monna Fam, MD as Consulting Physician (Ophthalmology) Lucky Cowboy, Gordy Clement, NP (Inactive) as Nurse Practitioner [facility] and Palliative Medicine)  Extended Emergency Contact Information Primary Emergency Contact: [facility] Address: [address], [place] [address] [name] of [place] Phone: [phone] Work Phone: [phone] Mobile Phone: [phone] Relation: Daughter  Code Status:  DNR Goals of care: Advanced Directive information Advanced Directives 08/07/2017  Does Patient Have a Medical Advance Directive? Yes  Type of Paramedic of [place];Living will;Out of facility DNR (pink MOST or yellow form)  Does patient want to make changes to medical advance directive? No - Patient declined  Copy of [place] in Chart? Yes  Pre-existing out of facility DNR order (yellow form or pink MOST form) Yellow form placed in chart (order not valid for inpatient use)     Chief Complaint  Patient presents with  . Medical Management of Chronic Issues    HPI:  Pt is a 84 y.o. female seen today for medical management of chronic diseases.     The patient has history of dementia, resides in [place] unit [facility], she is incontinent of B+B, self transfers and ambulates with walker. Her mood is stable on Risperdal 0.25mg  bid, Sertraline 150mg  qd, Mirtazapine 7.5mg  qhs. GERD stable on Omeprazole 10mg  qd. Hx of CVA, protein C/S deficiency, on chronic Xarelto 15mg  qhs. Hx of HTN, blood pressure is controlled on Metoprolol 12.5mg  bid. No constipation on MiraLax daily.   Past Medical History:  Diagnosis  Date  . Acute encephalopathy   . Aphasia following unspecified cerebrovascular disease   . 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 Depression   . Dysphagia following cerebrovascular disease   . Hepatic steatosis   . Hydroureteronephrosis   . Hypertension   . Insomnia   . Macular degeneration   . Major depressive disorder, recurrent ([place])   . Neuralgia and neuritis, unspecified (CODE)   . Nontraumatic intracranial hemorrhage ([place])   . Stroke ([place])   . 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   . 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 ABDOMINAL HYSTERECTOMY    . APPENDECTOMY    . BREAST SURGERY    . MASTECTOMY Right     Allergies  Allergen Reactions  . Hydrocodone Nausea And Vomiting  . Morphine And Related Nausea And Vomiting  . Actonel [Risedronate Sodium] Other (See Comments)    unknown  . Darvon [Propoxyphene Hcl] Other (See Comments)    unknown  . Oxycodone Hcl Other (See Comments)    unknown  . Pneumovax [Pneumococcal Polysaccharide Vaccine] Other (See Comments)    unknown    Allergies as of 09/11/2017      Reactions   Hydrocodone Nausea And Vomiting   Morphine And Related Nausea And Vomiting   Actonel [risedronate Sodium] Other (See Comments)   unknown   Darvon [propoxyphene Hcl] Other (See Comments)   unknown   Oxycodone Hcl Other (See Comments)   unknown   Pneumovax [pneumococcal Polysaccharide Vaccine] Other (  See Comments)   unknown      Medication List        Accurate as of 09/11/17 11:59 PM. Always use your most recent med list.          acetaminophen 325 MG tablet Commonly known as:  TYLENOL Take 650 mg by mouth every 4 (four) hours as needed for pain.   atorvastatin 10 MG tablet Commonly known as:  LIPITOR Take 10 mg by mouth daily.   bisacodyl 10 MG suppository Commonly known as:  DULCOLAX Place 1 suppository (10 mg  total) rectally daily as needed for moderate constipation.   fluticasone 50 MCG/ACT nasal spray Commonly known as:  FLONASE Place 1 spray into both nostrils as needed for allergies.   GAS RELIEF 80 PO Take 80 mg by mouth 3 (three) times daily with meals.   hydrocortisone 2.5 % cream Apply 1 application topically 2 (two) times daily as needed. Apply to affected area on face   metoprolol tartrate 25 MG tablet Commonly known as:  LOPRESSOR Take 12.5 mg by mouth 2 (two) times daily.   mirtazapine 15 MG tablet Commonly known as:  REMERON Take 7.5 mg by mouth at bedtime.   nystatin powder Generic drug:  nystatin Apply topically daily as needed. Under left breast   omeprazole 20 MG capsule Commonly known as:  PRILOSEC Take 10 mg by mouth daily.   polyethylene glycol packet Commonly known as:  MIRALAX / GLYCOLAX Take 17 g by mouth daily.   risperiDONE 0.25 MG tablet Commonly known as:  RISPERDAL Take 0.25 mg by mouth 2 (two) times daily.   Rivaroxaban 15 MG Tabs tablet Commonly known as:  XARELTO Take 1 tablet (15 mg total) by mouth daily at 6 PM.   sertraline 50 MG tablet Commonly known as:  ZOLOFT Take 50 mg by mouth daily. Take with 100 mg = 150 mg   sertraline 100 MG tablet Commonly known as:  ZOLOFT Take 1 tablet (100 mg total) by mouth daily at 6 PM.      ROS was provided with assistance of staff Review of Systems  Constitutional: Negative for activity change, appetite change, chills, diaphoresis, fatigue, fever and unexpected weight change.  HENT: Positive for hearing loss. Negative for congestion and voice change.   Respiratory: Negative for cough, shortness of breath and wheezing.   Cardiovascular: Positive for leg swelling. Negative for chest pain and palpitations.  Gastrointestinal: Negative for abdominal distention, abdominal pain, constipation, diarrhea, nausea and vomiting.  Genitourinary: Negative for difficulty urinating, dysuria and urgency.        Incontinent of urine  Musculoskeletal: Positive for gait problem.  Skin: Negative for color change and pallor.  Neurological: Negative for dizziness, speech difficulty, weakness and headaches.       Dementia  Psychiatric/Behavioral: Positive for confusion. Negative for agitation, behavioral problems, hallucinations and sleep disturbance. The patient is not nervous/anxious.     Immunization History  Administered Date(s) Administered  . Influenza-Unspecified 11/20/2013, 09/26/2014, 10/24/2015, 11/03/2016  . Tdap 09/19/2016   Pertinent  Health Maintenance Due  Topic Date Due  . DEXA SCAN  07/18/1994  . INFLUENZA VACCINE  08/06/2017   Fall Risk  09/05/2016  Falls in the past year? No   Functional Status Survey:    Vitals:   09/11/17 1052  BP: 118/72  Pulse: 70  Resp: 16  Temp: 97.7 F (36.5 C)  Weight: 162 lb (73.5 kg)   Body mass index is 28.7 kg/m. Physical Exam  Constitutional: She  appears well-developed and well-nourished.  HENT:  Head: Normocephalic and atraumatic.  Eyes: Pupils are equal, round, and reactive to light. EOM are normal.  Neck: Normal range of motion. Neck supple. No JVD present. No thyromegaly present.  Cardiovascular: Normal rate and regular rhythm.  No murmur heard. Pulmonary/Chest: Effort normal. She has no wheezes. She has no rales.  Abdominal: Soft. Bowel sounds are normal. She exhibits no distension. There is no tenderness.  Musculoskeletal: She exhibits edema.  Trace edema BLE. Self transfer. Ambulates with walker.   Neurological: She is alert. No cranial nerve deficit. She exhibits normal muscle tone. Coordination normal.  Oriented to person and her room on unit.   Skin: Skin is warm and dry.  Psychiatric: She has a normal mood and affect. Her behavior is normal.    Labs reviewed: Recent Labs    12/17/16 07/14/17  NA 139 142  K 3.6 3.6  CL  --  112  CO2  --  22  BUN 12 11  CREATININE 1.0 1.0  CALCIUM  --  8.9   Recent Labs     12/17/16 07/14/17  AST 17 18  ALT 14 20  ALKPHOS 101 87  PROT  --  6.0  ALBUMIN  --  4.0   Recent Labs    12/17/16 02/12/17 07/14/17  WBC 7.6 5.6 7.7  HGB 12.8 13.6 14.7  HCT 38 40 44  PLT 217 251 260   Lab Results  Component Value Date   TSH 1.56 02/12/2017   Lab Results  Component Value Date   HGBA1C 5.5 12/02/2016   Lab Results  Component Value Date   CHOL 174 10/21/2016   HDL 45 10/21/2016   LDLCALC 103 10/21/2016   TRIG 158 10/21/2016    Significant Diagnostic Results in last 30 days:  No results found.  Assessment/Plan  Hypertension Blood pressure is controlled, continue Metoprolol 12.5mg  bid.   GERD (gastroesophageal reflux disease) Stable, continue Omeprazole 10mg  qd.   Chronic constipation Stable, continue MiraLax daily, prn Bisacodyl    Depression due to dementia Her mood is stable, continue Risperdal 0.25mg  bid for hallucination, Sertraline 150mg  and Mirtazapine 7.5mg  for mood. Observe for AR  Protein C deficiency ([place]) Continue Xarelto 15mg  qd   Family/ staff Communication: plan of care reviewed with the patient and charge nurse.   Labs/tests ordered: none  Time spend 25 minutes.

## 2017-09-11 NOTE — Assessment & Plan Note (Signed)
Her mood is stable, continue Risperdal 0.25mg  bid for hallucination, Sertraline 150mg  and Mirtazapine 7.5mg  for mood. Observe for AR

## 2017-09-11 NOTE — Assessment & Plan Note (Signed)
Continue Xarelto 15mg qd.  

## 2017-09-11 NOTE — Assessment & Plan Note (Signed)
Stable, continue MiraLax daily, prn Bisacodyl

## 2017-09-11 NOTE — Assessment & Plan Note (Signed)
Blood pressure is controlled, continue Metoprolol 12.5mg bid.  

## 2017-09-11 NOTE — Assessment & Plan Note (Signed)
Stable, continue Omeprazole 10mg qd.  

## 2017-09-15 ENCOUNTER — Non-Acute Institutional Stay (SKILLED_NURSING_FACILITY): Payer: Medicare Other

## 2017-09-15 DIAGNOSIS — Z0000 Encounter for general adult medical examination without abnormal findings: Secondary | ICD-10-CM | POA: Diagnosis not present

## 2017-09-15 NOTE — Progress Notes (Signed)
Subjective:   Erica [name] is a 84 y.o. female who presents for Medicare Annual (Subsequent) preventive examination at [place]; incapacitated patient unable to answer questions appropriately   Last AWV-09/05/2016    Objective:     Vitals: BP 118/70 (BP Location: Left Arm, Patient Position: Sitting)   Pulse 72   Temp 98 F (36.7 C) (Oral)   Ht 5\' 3"  (1.6 m)   Wt 162 lb (73.5 kg)   BMI 28.70 kg/m   Body mass index is 28.7 kg/m.  Advanced Directives 09/15/2017 08/07/2017 07/13/2017 06/18/2017 05/25/2017 04/15/2017 03/18/2017  Does Patient Have a Medical Advance Directive? Yes Yes Yes Yes Yes Yes Yes  Type of Paramedic of [place];Living will;Out of facility DNR (pink MOST or yellow form) [place];Living will;Out of facility DNR (pink MOST or yellow form) Out of facility DNR (pink MOST or yellow form);[place];Living will [place];Living will;Out of facility DNR (pink MOST or yellow form) [place];Living will;Out of facility DNR (pink MOST or yellow form) [place];Living will;Out of facility DNR (pink MOST or yellow form) [place];Living will;Out of facility DNR (pink MOST or yellow form)  Does patient want to make changes to medical advance directive? No - Patient declined No - Patient declined No - Patient declined No - Patient declined No - Patient declined No - Patient declined No - Patient declined  Copy of [place] in Chart? Yes Yes Yes Yes Yes Yes Yes  Pre-existing out of facility DNR order (yellow form or pink MOST form) Yellow form placed in chart (order not valid for inpatient use) Yellow form placed in chart (order not valid for inpatient use) Yellow form placed in chart (order not valid for inpatient use) Yellow form placed in chart (order not valid for inpatient use) Yellow form placed in chart (order  not valid for inpatient use) Yellow form placed in chart (order not valid for inpatient use) Yellow form placed in chart (order not valid for inpatient use)    Tobacco Social History   Tobacco Use  Smoking Status Never Smoker  Smokeless Tobacco Never Used     Counseling given: Not Answered   Clinical Intake:  Pre-visit preparation completed: No  Pain : No/denies pain     Nutritional Risks: None Diabetes: No  How often do you need to have someone help you when you read instructions, pamphlets, or other written materials from your doctor or pharmacy?: 5 - Always(due to dementia)  Interpreter Needed?: No  Information entered by :: Tyson Dense, RN  Past Medical History:  Diagnosis Date  . Acute encephalopathy   . Aphasia following unspecified cerebrovascular disease   . 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 Depression   . Dysphagia following cerebrovascular disease   . Hepatic steatosis   . Hydroureteronephrosis   . Hypertension   . Insomnia   . Macular degeneration   . Major depressive disorder, recurrent ([place])   . Neuralgia and neuritis, unspecified (CODE)   . Nontraumatic intracranial hemorrhage ([place])   . Stroke ([place])   . 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   . 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 ABDOMINAL HYSTERECTOMY    . APPENDECTOMY    . BREAST SURGERY    . MASTECTOMY  Right    History reviewed. No pertinent family history. Social History   Socioeconomic History  . Marital status: Widowed    Spouse name: Not on file  . Number of children: Not on file  . Years of education: Not on file  . Highest education level: Not on file  Occupational History  . Not on file  Social Needs  . Financial resource strain: Not on file  . Food insecurity:    Worry: Not on file    Inability: Not on file  . Transportation needs:     Medical: Not on file    Non-medical: Not on file  Tobacco Use  . Smoking status: Never Smoker  . Smokeless tobacco: Never Used  Substance and Sexual Activity  . Alcohol use: No  . Drug use: No  . Sexual activity: Never  Lifestyle  . Physical activity:    Days per week: Not on file    Minutes per session: Not on file  . Stress: Not at all  Relationships  . Social connections:    Talks on phone: Not on file    Gets together: Not on file    Attends religious service: Not on file    Active member of club or organization: Not on file    Attends meetings of clubs or organizations: Not on file    Relationship status: Not on file  Other Topics Concern  . Not on file  Social History Narrative   Lives at [facility], [place] since 04/25/2013   Widowed   Never smoked   Alcohol none   DNR, POA, Living Will    Outpatient Encounter Medications as of 09/15/2017  Medication Sig  . acetaminophen (TYLENOL) 325 MG tablet Take 650 mg by mouth every 4 (four) hours as needed for pain.  Marland Kitchen atorvastatin (LIPITOR) 10 MG tablet Take 10 mg by mouth daily.  . bisacodyl (DULCOLAX) 10 MG suppository Place 1 suppository (10 mg total) rectally daily as needed for moderate constipation.  . fluticasone (FLONASE) 50 MCG/ACT nasal spray Place 1 spray into both nostrils as needed for allergies.   . hydrocortisone 2.5 % cream Apply 1 application topically 2 (two) times daily as needed. Apply to affected area on face  . metoprolol tartrate (LOPRESSOR) 25 MG tablet Take 12.5 mg by mouth 2 (two) times daily.   . mirtazapine (REMERON) 15 MG tablet Take 7.5 mg by mouth at bedtime.   Marland Kitchen nystatin (NYSTATIN) powder Apply topically daily as needed. Under left breast   . omeprazole (PRILOSEC) 20 MG capsule Take 10 mg by mouth daily.   . polyethylene glycol (MIRALAX / GLYCOLAX) packet Take 17 g by mouth daily.  . risperiDONE (RISPERDAL) 0.25 MG tablet Take 0.25 mg by mouth 2 (two) times daily.  . Rivaroxaban (XARELTO)  15 MG TABS tablet Take 1 tablet (15 mg total) by mouth daily at 6 PM.  . sertraline (ZOLOFT) 100 MG tablet Take 1 tablet (100 mg total) by mouth daily at 6 PM.  . sertraline (ZOLOFT) 50 MG tablet Take 50 mg by mouth daily. Take with 100 mg = 150 mg  . Simethicone (GAS RELIEF 80 PO) Take 80 mg by mouth 3 (three) times daily with meals.    No facility-administered encounter medications on file as of 09/15/2017.     Activities of Daily Living In your present state of health, do you have any difficulty performing the following activities: 09/15/2017  Hearing? N  Vision? Y  Difficulty concentrating or making  decisions? Y  Walking or climbing stairs? Y  Dressing or bathing? Y  Doing errands, shopping? Y  Preparing Food and eating ? Y  Using the Toilet? Y  In the past six months, have you accidently leaked urine? N  Do you have problems with loss of bowel control? N  Managing your Medications? Y  Managing your Finances? Y  Housekeeping or managing your Housekeeping? Y  Some recent data might be hidden    Patient Care Team: Blanchie Serve, MD as PCP - General (Internal Medicine) Mast, Man X, NP as Nurse Practitioner (Internal Medicine) Juluis Rainier as Consulting Physician (Optometry) Monna Fam, MD as Consulting Physician (Ophthalmology) Lucky Cowboy Gordy Clement, NP (Inactive) as Nurse Practitioner [facility] and Palliative Medicine)    Assessment:   This is a routine wellness examination for Manuelita.  Exercise Activities and Dietary recommendations Current Exercise Habits: The patient does not participate in regular exercise at present, Exercise limited by: neurologic condition(s);orthopedic condition(s)  Goals   None     Fall Risk Fall Risk  09/15/2017 09/05/2016  Falls in the past year? No No   Is the patient's home free of loose throw rugs in walkways, pet beds, electrical cords, etc?   yes      Grab bars in the bathroom? yes      Handrails on the stairs?   yes      Adequate  lighting?   yes  Depression Screen PHQ 2/9 Scores 09/15/2017 09/05/2016  PHQ - 2 Score 0 0     Cognitive Function MMSE - Mini Mental State Exam 09/15/2017 09/05/2016  Not completed: Unable to complete Unable to complete  Orientation to time 0 -  Orientation to Place 0 -  Registration 0 -  Attention/ Calculation 0 -  Recall 0 -  Language- name 2 objects 0 -  Language- repeat 0 -  Language- follow 3 step command 0 -  Language- read & follow direction 0 -  Write a sentence 0 -  Copy design 0 -  Total score 0 -        Immunization History  Administered Date(s) Administered  . Influenza-Unspecified 11/20/2013, 09/26/2014, 10/24/2015, 11/03/2016  . Tdap 09/19/2016    Qualifies for Shingles Vaccine? Not in past records  Screening Tests Health Maintenance  Topic Date Due  . DEXA SCAN  07/18/1994  . INFLUENZA VACCINE  08/06/2017  . TETANUS/TDAP  09/20/2026    Cancer Screenings: Lung: Low Dose CT Chest recommended if Age 83-80 years, 30 pack-year currently smoking OR have quit w/in 15years. Patient does not qualify. Breast:  Up to date on Mammogram? excluded Up to date of Bone Density/Dexa? Yes Colorectal: excluded  Additional Screenings:  Hepatitis C Screening: unable to appropriately accept or decline Flu vaccine due: will receive at [place]:    I have personally reviewed and addressed the Medicare Annual Wellness questionnaire and have noted the following in the patient's chart:  A. Medical and social history B. Use of alcohol, tobacco or illicit drugs  C. Current medications and supplements D. Functional ability and status E.  Nutritional status F.  Physical activity G. Advance directives H. List of other physicians I.  Hospitalizations, surgeries, and ER visits in previous 12 months J.  [place] to include hearing, vision, cognitive, depression L. Referrals and appointments - none  In addition, I am unable to review and discuss with  incapacitated patient certain preventive protocols, quality metrics, and best practice recommendations. A written personalized  care plan for preventive services as well as general preventive health recommendations were provided to patient.   See attached scanned questionnaire for additional information.   Signed,   Tyson Dense, RN Nurse Health Advisor

## 2017-09-18 ENCOUNTER — Encounter: Payer: Self-pay | Admitting: Nurse Practitioner

## 2017-09-18 ENCOUNTER — Non-Acute Institutional Stay (SKILLED_NURSING_FACILITY): Payer: Medicare Other | Admitting: Nurse Practitioner

## 2017-09-18 DIAGNOSIS — F01518 Vascular dementia, unspecified severity, with other behavioral disturbance: Secondary | ICD-10-CM

## 2017-09-18 DIAGNOSIS — F0151 Vascular dementia with behavioral disturbance: Secondary | ICD-10-CM | POA: Diagnosis not present

## 2017-09-18 DIAGNOSIS — B379 Candidiasis, unspecified: Secondary | ICD-10-CM

## 2017-09-18 NOTE — Assessment & Plan Note (Addendum)
White coated tongue, will try Magic mouth wash 3ml s/s ac and hs x 2weeks. Observe. The patient's appetite was reportedly poor, will monitor weight 2x/week, re-evaluate in 2 weeks.  09/14/17 dc magic mouth wash-refusal, will try Diflucan 100mg  po qd x 3 d days, observe.

## 2017-09-18 NOTE — Progress Notes (Addendum)
Location:  SNF [place] Room Number: 627 OJJKK of Service:  SNF (31) Provider: Lennie [place] Breeding NP  Blanchie Serve, MD  Patient Care Team: Blanchie Serve, MD as PCP - General (Internal Medicine) Chellsie Gomer X, NP as Nurse Practitioner (Internal Medicine) Juluis Rainier as Consulting Physician (Optometry) Monna Fam, MD as Consulting Physician (Ophthalmology) Lucky Cowboy, Gordy Clement, NP (Inactive) as Nurse Practitioner [facility] and Palliative Medicine)  Extended Emergency Contact Information Primary Emergency Contact: [facility] Address: [address],  [place] [address] [name] of [place] Phone: [phone] Work Phone: [phone] Mobile Phone: [phone] Relation: Daughter  Code Status: DNR Goals of care: Advanced Directive information Advanced Directives 09/15/2017  Does Patient Have a Medical Advance Directive? Yes  Type of Paramedic of [place];Living will;Out of facility DNR (pink MOST or yellow form)  Does patient want to make changes to medical advance directive? No - Patient declined  Copy of [place] in Chart? Yes  Pre-existing out of facility DNR order (yellow form or pink MOST form) Yellow form placed in chart (order not valid for inpatient use)     Chief Complaint  Patient presents with  . Acute Visit    white coating on tongue, decreased appetite    HPI:  Pt is a 84 y.o. female seen today for an acute visit for white coating of tongue, duration is uncertain, she denied sore or pain in mouth, no noted difficulty of swallowing. Her POA daughter reported the patient's appetite seems poor, denied nausea, vomiting, constipation, or diarrhea. She denied abd pain. She resides in memory care unit [facility] for safety and care assistance.     Past Medical History:  Diagnosis Date  . Acute encephalopathy   . Aphasia following unspecified cerebrovascular disease   . Chronic  embolism and thrombosis of unspecified vein   . Depression   . Dysphagia following cerebrovascular disease   . Hepatic steatosis   . Hydroureteronephrosis   . Hypertension   . Insomnia   . Macular degeneration   . Major depressive disorder, recurrent ([place])   . Neuralgia and neuritis, unspecified (CODE)   . Nontraumatic intracranial hemorrhage ([place])   . Stroke ([place])   . 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   . 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 ABDOMINAL HYSTERECTOMY    . APPENDECTOMY    . BREAST SURGERY    . MASTECTOMY Right     Allergies  Allergen Reactions  . Hydrocodone Nausea And Vomiting  . Morphine And Related Nausea And Vomiting  . Actonel [Risedronate Sodium] Other (See Comments)    unknown  . Darvon [Propoxyphene Hcl] Other (See Comments)    unknown  . Oxycodone Hcl Other (See Comments)    unknown  . Pneumovax [Pneumococcal Polysaccharide Vaccine] Other (See Comments)    unknown    Allergies as of 09/18/2017      Reactions   Hydrocodone Nausea And Vomiting   Morphine And Related Nausea And Vomiting   Actonel [risedronate Sodium] Other (See Comments)   unknown   Darvon [propoxyphene Hcl] Other (See Comments)   unknown   Oxycodone Hcl Other (See Comments)   unknown   Pneumovax [pneumococcal Polysaccharide Vaccine] Other (See Comments)   unknown      Medication List  Accurate as of 09/18/17 11:59 PM. Always use your most recent med list.          acetaminophen 325 MG tablet Commonly known as:  TYLENOL Take 650 mg by mouth every 4 (four) hours as needed for pain.   atorvastatin 10 MG tablet Commonly known as:  LIPITOR Take 10 mg by mouth daily.   bisacodyl 10 MG suppository Commonly known as:  DULCOLAX Place 1 suppository (10 mg total) rectally daily as needed for moderate constipation.   fluticasone 50 MCG/ACT nasal  spray Commonly known as:  FLONASE Place 1 spray into both nostrils as needed for allergies.   GAS RELIEF 80 PO Take 80 mg by mouth 3 (three) times daily with meals.   hydrocortisone 2.5 % cream Apply 1 application topically 2 (two) times daily as needed. Apply to affected area on face   metoprolol tartrate 25 MG tablet Commonly known as:  LOPRESSOR Take 12.5 mg by mouth 2 (two) times daily.   mirtazapine 15 MG tablet Commonly known as:  REMERON Take 7.5 mg by mouth at bedtime.   nystatin powder Generic drug:  nystatin Apply topically daily as needed. Under left breast   omeprazole 20 MG capsule Commonly known as:  PRILOSEC Take 10 mg by mouth daily.   polyethylene glycol packet Commonly known as:  MIRALAX / GLYCOLAX Take 17 g by mouth daily.   risperiDONE 0.25 MG tablet Commonly known as:  RISPERDAL Take 0.25 mg by mouth 2 (two) times daily.   Rivaroxaban 15 MG Tabs tablet Commonly known as:  XARELTO Take 1 tablet (15 mg total) by mouth daily at 6 PM.   sertraline 50 MG tablet Commonly known as:  ZOLOFT Take 50 mg by mouth daily. Take with 100 mg = 150 mg   sertraline 100 MG tablet Commonly known as:  ZOLOFT Take 1 tablet (100 mg total) by mouth daily at 6 PM.      ROS was provided with assistance of staff Review of Systems  Constitutional: Positive for appetite change and unexpected weight change. Negative for activity change, chills, diaphoresis, fatigue and fever.  HENT: Positive for hearing loss. Negative for congestion, mouth sores, sore throat and voice change.   Respiratory: Negative for cough, shortness of breath and wheezing.   Cardiovascular: Positive for leg swelling. Negative for chest pain and palpitations.  Gastrointestinal: Negative for abdominal distention, abdominal pain, constipation, diarrhea, nausea and vomiting.  Genitourinary: Negative for difficulty urinating, dysuria and urgency.  Musculoskeletal: Positive for gait problem.  Skin:  Negative for color change and pallor.  Neurological: Negative for dizziness, speech difficulty, weakness and headaches.       Dementia  Psychiatric/Behavioral: Positive for confusion. Negative for agitation, behavioral problems, hallucinations and sleep disturbance. The patient is not nervous/anxious.     Immunization History  Administered Date(s) Administered  . Influenza-Unspecified 11/20/2013, 09/26/2014, 10/24/2015, 11/03/2016  . Tdap 09/19/2016   Pertinent  Health Maintenance Due  Topic Date Due  . DEXA SCAN  07/18/1994  . INFLUENZA VACCINE  08/06/2017   Fall Risk  09/15/2017 09/05/2016  Falls in the past year? No No   Functional Status Survey:    Vitals:   09/18/17 1636  BP: 130/72  Pulse: 76  Resp: 18  Temp: (!) 97.5 F (36.4 C)   There is no height or weight on file to calculate BMI. Physical Exam  Constitutional: She appears well-developed and well-nourished.  HENT:  Head: Normocephalic and atraumatic.  While coating tongue.   Eyes: Pupils  are equal, round, and reactive to light. EOM are normal.  Neck: Normal range of motion. Neck supple. No JVD present. No thyromegaly present.  Cardiovascular: Normal rate and regular rhythm.  No murmur heard. Pulmonary/Chest: Effort normal and breath sounds normal. She has no wheezes. She has no rales.  Abdominal: Soft. Bowel sounds are normal. She exhibits no distension. There is no tenderness. There is no rebound.  Musculoskeletal: She exhibits edema.  Trace edema BLE  Neurological: She is alert. No cranial nerve deficit. She exhibits normal muscle tone. Coordination normal.  Oriented to self and her room on unit.   Skin: Skin is warm and dry.  Psychiatric: She has a normal mood and affect. Her behavior is normal.    Labs reviewed: Recent Labs    12/17/16 07/14/17  NA 139 142  K 3.6 3.6  CL  --  112  CO2  --  22  BUN 12 11  CREATININE 1.0 1.0  CALCIUM  --  8.9   Recent Labs    12/17/16 07/14/17  AST 17 18  ALT  14 20  ALKPHOS 101 87  PROT  --  6.0  ALBUMIN  --  4.0   Recent Labs    12/17/16 02/12/17 07/14/17  WBC 7.6 5.6 7.7  HGB 12.8 13.6 14.7  HCT 38 40 44  PLT 217 251 260   Lab Results  Component Value Date   TSH 1.56 02/12/2017   Lab Results  Component Value Date   HGBA1C 5.5 12/02/2016   Lab Results  Component Value Date   CHOL 174 10/21/2016   HDL 45 10/21/2016   LDLCALC 103 10/21/2016   TRIG 158 10/21/2016    Significant Diagnostic Results in last 30 days:  No results found.  Assessment/Plan: Candidiasis White coated tongue, will try Magic mouth wash 59ml s/s ac and hs x 2weeks. Observe. The patient's appetite was reportedly poor, will monitor weight 2x/week, re-evaluate in 2 weeks.  09/14/17 dc magic mouth wash-refusal, will try Diflucan 100mg  po qd x 3 d days, observe.   Vascular dementia with behavior disturbance Continue memory care unit [facility] for safety and care unit.     Family/ staff Communication: plan of care reviewed with the patient and charge nurse.   Labs/tests ordered:  None  Time spend 25 minutes.

## 2017-09-18 NOTE — Assessment & Plan Note (Signed)
Continue memory care unit [facility] for safety and care unit.

## 2017-09-30 ENCOUNTER — Encounter: Payer: Medicare Other | Admitting: Nurse Practitioner

## 2017-10-01 ENCOUNTER — Encounter: Payer: Medicare Other | Admitting: Nurse Practitioner

## 2017-10-07 ENCOUNTER — Encounter: Payer: Self-pay | Admitting: Nurse Practitioner

## 2017-10-07 ENCOUNTER — Non-Acute Institutional Stay (SKILLED_NURSING_FACILITY): Payer: Medicare Other | Admitting: Nurse Practitioner

## 2017-10-07 DIAGNOSIS — R627 Adult failure to thrive: Secondary | ICD-10-CM

## 2017-10-07 DIAGNOSIS — F329 Major depressive disorder, single episode, unspecified: Secondary | ICD-10-CM | POA: Diagnosis not present

## 2017-10-07 DIAGNOSIS — K5909 Other constipation: Secondary | ICD-10-CM

## 2017-10-07 DIAGNOSIS — F0280 Dementia in other diseases classified elsewhere without behavioral disturbance: Secondary | ICD-10-CM

## 2017-10-07 DIAGNOSIS — I10 Essential (primary) hypertension: Secondary | ICD-10-CM | POA: Diagnosis not present

## 2017-10-07 DIAGNOSIS — F0393 Unspecified dementia, unspecified severity, with mood disturbance: Secondary | ICD-10-CM

## 2017-10-07 NOTE — Assessment & Plan Note (Signed)
Gradual weight loss, #166Ibs 07/13/17, #163Ibs 08/07/17, #162Ibs 09/11/17, # 160Ibs 10/07/17. Will monitor weight weekly, encourage oral intake.

## 2017-10-07 NOTE — Progress Notes (Signed)
Location:  [facility] Room Number: 109 Place of Service:  SNF (31) Provider:  Marlana Latus  NP  Rachelle Edwards X, NP  Patient Care Team: Bashar Milam X, NP as PCP - General (Internal Medicine) Annessa Satre X, NP as Nurse Practitioner (Internal Medicine) Dunn, Warnell Bureau as Consulting Physician (Optometry) Monna Fam, MD as Consulting Physician (Ophthalmology) Lucky Cowboy, Gordy Clement, NP (Inactive) as Nurse Practitioner [facility] and Palliative Medicine)  Extended Emergency Contact Information Primary Emergency Contact: [facility] Address: [address], [place] [address] [name] of [place] Phone: [phone] Work Phone: [phone] Mobile Phone: [phone] Relation: Daughter  Code Status:  DNR Goals of care: Advanced Directive information Advanced Directives 10/07/2017  Does Patient Have a Medical Advance Directive? Yes  Type of Paramedic of [place];Living will;Out of facility DNR (pink MOST or yellow form)  Does patient want to make changes to medical advance directive? No - Patient declined  Copy of [place] in Chart? Yes  Pre-existing out of facility DNR order (yellow form or pink MOST form) Yellow form placed in chart (order not valid for inpatient use)     Chief Complaint  Patient presents with  . Medical Management of Chronic Issues    F/u-HTN, dementia, depression, GERD, constipation,    HPI:  Pt is a 84 y.o. female seen today for medical management of chronic diseases.     The patient has history of dementia, resides in memory care unit, [facility] for safety and care assistance. Her mood is stabilized on Sertraline 150mg  qd, Mirtazapine 7.5mg  qhs, Risperidone 0.25mg  bid. No constipation while on MiraLax daily, prn Bisacodyl suppository qd. Hx of HTN, blood pressure is controlled on Metoprolol 12.5mg  bid.    Past Medical History:  Diagnosis Date  . Acute  encephalopathy   . Aphasia following unspecified cerebrovascular disease   . 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 Depression   . Dysphagia following cerebrovascular disease   . Hepatic steatosis   . Hydroureteronephrosis   . Hypertension   . Insomnia   . Macular degeneration   . Major depressive disorder, recurrent ([place])   . Neuralgia and neuritis, unspecified (CODE)   . Nontraumatic intracranial hemorrhage ([place])   . Stroke ([place])   . 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   . 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 ABDOMINAL HYSTERECTOMY    . APPENDECTOMY    . BREAST SURGERY    . MASTECTOMY Right     Allergies  Allergen Reactions  . Hydrocodone Nausea And Vomiting  . Morphine And Related Nausea And Vomiting  . Actonel [Risedronate Sodium] Other (See Comments)    unknown  . Darvon [Propoxyphene Hcl] Other (See Comments)    unknown  . Oxycodone Hcl Other (See Comments)    unknown  . Pneumovax [Pneumococcal Polysaccharide Vaccine] Other (See Comments)    unknown    Outpatient Encounter Medications as of 10/07/2017  Medication Sig  . acetaminophen (TYLENOL) 325 MG tablet Take 650 mg by mouth every 4 (four) hours as needed.  Marland Kitchen atorvastatin (LIPITOR) 10 MG tablet Take 10 mg by mouth daily.  . bisacodyl (DULCOLAX) 10 MG suppository Place 10 mg rectally daily as needed for mild constipation or moderate constipation.  . hydrocortisone 2.5 % cream Apply 1  application topically 2 (two) times daily as needed. Apply to affected area on face  . metoprolol tartrate (LOPRESSOR) 25 MG tablet Take 12.5 mg by mouth 2 (two) times daily.   . mirtazapine (REMERON) 15 MG tablet Take 7.5 mg by mouth at bedtime.   Marland Kitchen nystatin (NYSTATIN) powder Apply topically daily as needed. Under left breast   . omeprazole (PRILOSEC) 20 MG capsule Take 10 mg by mouth daily.   .  polyethylene glycol (MIRALAX / GLYCOLAX) packet Take 17 g by mouth daily.  . risperiDONE (RISPERDAL) 0.25 MG tablet Take 0.25 mg by mouth 2 (two) times daily.  . Rivaroxaban (XARELTO) 15 MG TABS tablet Take 1 tablet (15 mg total) by mouth daily at 6 PM.  . sertraline (ZOLOFT) 100 MG tablet Take 1 tablet (100 mg total) by mouth daily at 6 PM.  . sertraline (ZOLOFT) 50 MG tablet Take 50 mg by mouth daily. Take with 100 mg = 150 mg  . Simethicone (GAS RELIEF 80 PO) Take 80 mg by mouth 3 (three) times daily with meals.   . [DISCONTINUED] acetaminophen (TYLENOL) 325 MG tablet Take 650 mg by mouth every 4 (four) hours as needed for pain.  . [DISCONTINUED] atorvastatin (LIPITOR) 10 MG tablet Take 10 mg by mouth daily.  . [DISCONTINUED] bisacodyl (DULCOLAX) 10 MG suppository Place 1 suppository (10 mg total) rectally daily as needed for moderate constipation.  . [DISCONTINUED] fluticasone (FLONASE) 50 MCG/ACT nasal spray Place 1 spray into both nostrils as needed for allergies.    No facility-administered encounter medications on file as of 10/07/2017.    ROS was provided with assistance of staff Review of Systems  Constitutional: Positive for unexpected weight change. Negative for activity change, appetite change, chills, diaphoresis, fatigue and fever.  HENT: Positive for hearing loss. Negative for congestion, mouth sores, sore throat and voice change.   Respiratory: Negative for cough, shortness of breath and wheezing.   Cardiovascular: Positive for leg swelling. Negative for chest pain and palpitations.  Gastrointestinal: Negative for abdominal distention, abdominal pain, constipation, diarrhea, nausea and vomiting.  Genitourinary: Negative for difficulty urinating, dysuria and urgency.  Musculoskeletal: Positive for arthralgias and gait problem.  Skin: Negative for color change and pallor.  Neurological: Negative for dizziness, speech difficulty, weakness and headaches.       Dementia    Psychiatric/Behavioral: Positive for confusion. Negative for agitation, behavioral problems, hallucinations and sleep disturbance. The patient is not nervous/anxious.     Immunization History  Administered Date(s) Administered  . Influenza-Unspecified 11/20/2013, 09/26/2014, 10/24/2015, 11/03/2016  . Tdap 09/19/2016   Pertinent  Health Maintenance Due  Topic Date Due  . DEXA SCAN  07/18/1994  . INFLUENZA VACCINE  08/06/2017   Fall Risk  09/15/2017 09/05/2016  Falls in the past year? No No   Functional Status Survey:    Vitals:   10/07/17 1344  BP: 130/64  Pulse: 68  Resp: 16  Temp: 98.4 F (36.9 C)  SpO2: 96%  Weight: 160 lb 6.4 oz (72.8 kg)  Height: 5\' 3"  (1.6 m)   Body mass index is 28.41 kg/m. Physical Exam  Constitutional: She appears well-developed and well-nourished.  HENT:  Head: Normocephalic and atraumatic.  Eyes: Pupils are equal, round, and reactive to light. EOM are normal.  Neck: Normal range of motion. Neck supple. No JVD present. No thyromegaly present.  Cardiovascular: Normal rate and regular rhythm.  No murmur heard. Pulmonary/Chest: Effort normal. She has no wheezes. She has no rales.  Abdominal: Soft. She exhibits  no distension. There is no tenderness. There is no rebound and no guarding.  Musculoskeletal: She exhibits edema.  Trace edema BLE. Self transfers. Ambulates with walker on unit.   Neurological: She is alert. No cranial nerve deficit. She exhibits normal muscle tone. Coordination normal.  Oriented to self and her room on unit.   Skin: Skin is warm and dry.  Psychiatric: She has a normal mood and affect.    Labs reviewed: Recent Labs    12/17/16 07/14/17  NA 139 142  K 3.6 3.6  CL  --  112  CO2  --  22  BUN 12 11  CREATININE 1.0 1.0  CALCIUM  --  8.9   Recent Labs    12/17/16 07/14/17  AST 17 18  ALT 14 20  ALKPHOS 101 87  PROT  --  6.0  ALBUMIN  --  4.0   Recent Labs    12/17/16 02/12/17 07/14/17  WBC 7.6 5.6 7.7   HGB 12.8 13.6 14.7  HCT 38 40 44  PLT 217 251 260   Lab Results  Component Value Date   TSH 1.56 02/12/2017   Lab Results  Component Value Date   HGBA1C 5.5 12/02/2016   Lab Results  Component Value Date   CHOL 174 10/21/2016   HDL 45 10/21/2016   LDLCALC 103 10/21/2016   TRIG 158 10/21/2016    Significant Diagnostic Results in last 30 days:  No results found.  Assessment/Plan Adult failure to thrive Gradual weight loss, #166Ibs 07/13/17, #163Ibs 08/07/17, #162Ibs 09/11/17, # 160Ibs 10/07/17. Will monitor weight weekly, encourage oral intake.   Depression due to dementia dementia, resides in memory care unit, [facility] for safety and care assistance. Her mood is stabilized, continue  Sertraline 150mg  qd, Mirtazapine 7.5mg  qhs, Risperidone 0.25mg  bid.   Chronic constipation No constipation, continue MiraLax daily, prn Bisacodyl suppository qd.   Hypertension Hx of HTN, blood pressure is controlled, continue Metoprolol 12.5mg  bid.       Family/ staff Communication: plan of care reviewed with the patient and charge nurse.   Labs/tests ordered:  none  Time spend 25 minutes.

## 2017-10-07 NOTE — Assessment & Plan Note (Signed)
No constipation, continue MiraLax daily, prn Bisacodyl suppository qd.

## 2017-10-07 NOTE — Assessment & Plan Note (Signed)
Hx of HTN, blood pressure is controlled, continue Metoprolol 12.5mg  bid.

## 2017-10-07 NOTE — Assessment & Plan Note (Addendum)
dementia, resides in memory care unit, [facility] for safety and care assistance. Her mood is stabilized, continue  Sertraline 150mg  qd, Mirtazapine 7.5mg  qhs, Risperidone 0.25mg  bid.

## 2017-10-20 ENCOUNTER — Non-Acute Institutional Stay: Payer: Medicare Other | Admitting: Nurse Practitioner

## 2017-10-20 DIAGNOSIS — F0151 Vascular dementia with behavioral disturbance: Secondary | ICD-10-CM

## 2017-10-20 DIAGNOSIS — R269 Unspecified abnormalities of gait and mobility: Secondary | ICD-10-CM

## 2017-10-20 DIAGNOSIS — F01518 Vascular dementia, unspecified severity, with other behavioral disturbance: Secondary | ICD-10-CM

## 2017-10-20 DIAGNOSIS — F339 Major depressive disorder, recurrent, unspecified: Secondary | ICD-10-CM

## 2017-10-20 DIAGNOSIS — R627 Adult failure to thrive: Secondary | ICD-10-CM

## 2017-10-20 DIAGNOSIS — F419 Anxiety disorder, unspecified: Secondary | ICD-10-CM

## 2017-10-20 DIAGNOSIS — Z515 Encounter for palliative care: Secondary | ICD-10-CM

## 2017-10-20 NOTE — Progress Notes (Addendum)
PALLIATIVE CARE CONSULT VISIT   PATIENT NAME: Erica [name] DOB: [birth year] MRN: [id number] [facility]   PRIMARY CARE PROVIDER:   Mast, Man X, NP  REFERRING PROVIDER:  Mast, Man X, NP [address], [place] [address]   Patient Care Team: Mast, Man X, NP as PCP - General (Internal Medicine) Mast, Man X, NP as Nurse Practitioner (Internal Medicine) Juluis Rainier as Consulting Physician (Optometry) Monna Fam, MD as Consulting Physician (Ophthalmology) Lucky Cowboy, Gordy Clement, NP (Inactive) as Nurse Practitioner [facility] and Palliative Medicine)  Extended Emergency Contact Information Primary Emergency Contact: [facility] Address: [address], [place] [address] [name] of [place] Phone: [phone] Work Phone: [phone] Mobile Phone: [phone] Relation: Daughter  HISTORY OF PRESENT ILLNESS:  Erica [name] is a 84 y.o. year old female with multiple medical problems including vascular dementia with behavioral disturbance s/p CAV, aphasia, dysphasia, gait disorder, depression, anxiety failure to thrive,HTN,HLD,DVT. Palliative Care was asked to help address goals of care.     ASSESSMENT/RECOMMENDATIONS and PLAN:  Vascular Dementia with behavioral disturbance S/p CVA with aphasia and dysphagia Gait disorer Depression/anxiety Failure to thrive -alert, confused; oriented to name only; requires 100% assistance with ADL's; can feed herself -uses rolling walker -risperdal 0.25mg  -sertraline 150mg  -mirtazapine  Hypertension HLD DVT on Xarelto (has protein C/S deficiency GERD   -Omeprazole  ACP -DNR   I spent 45 minutes providing this consultation,  from 3:00 to 3:45. More than 50% of the time in this consultation was spent coordinating communication.    CODE STATUS: DNR  PPS: 50% HOSPICE ELIGIBILITY/DIAGNOSIS: TBD  PAST MEDICAL HISTORY:  Past Medical History:  Diagnosis Date  . Acute  encephalopathy   . Aphasia following unspecified cerebrovascular disease   . 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 Depression   . Dysphagia following cerebrovascular disease   . Hepatic steatosis   . Hydroureteronephrosis   . Hypertension   . Insomnia   . Macular degeneration   . Major depressive disorder, recurrent ([place])   . Neuralgia and neuritis, unspecified (CODE)   . Nontraumatic intracranial hemorrhage ([place])   . Stroke ([place])   . 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   . 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 Smoking status: Never Smoker  . Smokeless tobacco: Never Used  Substance Use Topics  . Alcohol use: No    ALLERGIES:  Allergies  Allergen Reactions  . Hydrocodone Nausea And Vomiting  . Morphine And Related Nausea And Vomiting  . Actonel [Risedronate Sodium] Other (See Comments)    unknown  . Darvon [Propoxyphene Hcl] Other (See Comments)    unknown  . Oxycodone Hcl Other (See Comments)    unknown  . Pneumovax [Pneumococcal Polysaccharide Vaccine] Other (See Comments)    unknown     PERTINENT MEDICATIONS:  Outpatient Encounter Medications as of 10/20/2017  Medication Sig  . acetaminophen (TYLENOL) 325 MG tablet Take 650 mg by mouth every 4 (four) hours as needed.  Marland Kitchen atorvastatin (LIPITOR) 10 MG tablet Take 10 mg by mouth daily.  . bisacodyl (DULCOLAX) 10 MG suppository Place 10 mg rectally daily as needed for mild constipation or moderate constipation.  . hydrocortisone 2.5 % cream Apply 1 application topically 2 (two) times daily as needed. Apply to affected  area on face  . metoprolol tartrate (LOPRESSOR) 25 MG tablet Take 12.5 mg by mouth 2 (two) times daily.   . mirtazapine (REMERON) 15 MG tablet Take 7.5 mg by mouth at bedtime.   Marland Kitchen nystatin (NYSTATIN) powder Apply topically daily as needed. Under left breast   . omeprazole (PRILOSEC) 20  MG capsule Take 10 mg by mouth daily.   . polyethylene glycol (MIRALAX / GLYCOLAX) packet Take 17 g by mouth daily.  . risperiDONE (RISPERDAL) 0.25 MG tablet Take 0.25 mg by mouth 2 (two) times daily.  . Rivaroxaban (XARELTO) 15 MG TABS tablet Take 1 tablet (15 mg total) by mouth daily at 6 PM.  . sertraline (ZOLOFT) 100 MG tablet Take 1 tablet (100 mg total) by mouth daily at 6 PM.  . sertraline (ZOLOFT) 50 MG tablet Take 50 mg by mouth daily. Take with 100 mg = 150 mg  . Simethicone (GAS RELIEF 80 PO) Take 80 mg by mouth 3 (three) times daily with meals.    No facility-administered encounter medications on file as of 10/20/2017.     PHYSICAL EXAM:   General: NAD, well-developed, well nourished Cardiovascular: regular rate and rhythm Pulmonary: clear ant fields Abdomen: soft, nontender, + bowel sounds GU: no suprapubic tenderness Extremities: no edema, no joint deformities Skin: no rashes Neurological: alert able to state her name otherwise disoriented; nonfocal  Erica Tsan G [place], NP

## 2017-11-09 ENCOUNTER — Encounter: Payer: Self-pay | Admitting: Family Medicine

## 2017-11-09 ENCOUNTER — Non-Acute Institutional Stay (SKILLED_NURSING_FACILITY): Payer: Medicare Other | Admitting: Family Medicine

## 2017-11-09 DIAGNOSIS — F01518 Vascular dementia, unspecified severity, with other behavioral disturbance: Secondary | ICD-10-CM

## 2017-11-09 DIAGNOSIS — I639 Cerebral infarction, unspecified: Secondary | ICD-10-CM | POA: Diagnosis not present

## 2017-11-09 DIAGNOSIS — F0151 Vascular dementia with behavioral disturbance: Secondary | ICD-10-CM | POA: Diagnosis not present

## 2017-11-09 DIAGNOSIS — I10 Essential (primary) hypertension: Secondary | ICD-10-CM | POA: Diagnosis not present

## 2017-11-09 DIAGNOSIS — D6859 Other primary thrombophilia: Secondary | ICD-10-CM | POA: Diagnosis not present

## 2017-11-09 DIAGNOSIS — W19XXXA Unspecified fall, initial encounter: Secondary | ICD-10-CM

## 2017-11-09 NOTE — Progress Notes (Signed)
Provider:  Alain Honey, MD Location:  [facility] Room Number: 109 Place of Service:  SNF (31)  PCP: Mast, Man X, NP Patient Care Team: Mast, Man X, NP as PCP - General (Internal Medicine) Mast, Man X, NP as Nurse Practitioner (Internal Medicine) Dunn, Warnell Bureau as Consulting Physician (Optometry) Monna Fam, MD as Consulting Physician (Ophthalmology) Lucky Cowboy, Gordy Clement, NP (Inactive) as Nurse Practitioner [facility] and Palliative Medicine)  Extended Emergency Contact Information Primary Emergency Contact: [facility] Address: [address], [place] [address] [name] of [place] Phone: [phone] Work Phone: [phone] Mobile Phone: [phone] Relation: Daughter  Code Status: DNR Goals of Care: Advanced Directive information Advanced Directives 11/09/2017  Does Patient Have a Medical Advance Directive? Yes  Type of Paramedic of [place];Living will;Out of facility DNR (pink MOST or yellow form)  Does patient want to make changes to medical advance directive? No - Patient declined  Copy of [place] in Chart? Yes  Pre-existing out of facility DNR order (yellow form or pink MOST form) Yellow form placed in chart (order not valid for inpatient use)      Chief Complaint  Patient presents with  . Medical Management of Chronic Issues    F/u- HTN, constipation, dementia, GERD,    HPI: Patient is a 84 y.o. female seen today for medical management of chronic problems including: Dementia, major depressive disorder, adult failure to thrive, anorexia.  Patient currently resides in memory care.  There have been some behavioral issues and these are managed with risperidone 0.25 mg twice daily.  She is also on the 150 mg of Zoloft for her depression.  Nursing staff was concerned that patient's not eating well.  She does take Remeron 7.5 mg 4 appetite stimulation.  There  are many days now where she does not want to get out of bed.  Past Medical History:  Diagnosis Date  . Acute encephalopathy   . Aphasia following unspecified cerebrovascular disease   . 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 Depression   . Dysphagia following cerebrovascular disease   . Hepatic steatosis   . Hydroureteronephrosis   . Hypertension   . Insomnia   . Macular degeneration   . Major depressive disorder, recurrent ([place])   . Neuralgia and neuritis, unspecified (CODE)   . Nontraumatic intracranial hemorrhage ([place])   . Stroke ([place])   . 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   . 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 ABDOMINAL HYSTERECTOMY    . APPENDECTOMY    . BREAST SURGERY    . MASTECTOMY Right     reports that she has never smoked. She has never used smokeless tobacco. She reports that she does not drink alcohol or use drugs. Social History   Socioeconomic History  . Marital status: Widowed    Spouse name: Not on file  . Number of children: Not on file  . Years of education: Not on file  . Highest education level: Not on file  Occupational History  . Not on file  Social Needs  . Financial resource strain: Not on file  . Food insecurity:    Worry: Not on file    Inability: Not on file  . Transportation needs:    Medical: Not on file  Non-medical: Not on file  Tobacco Use  . Smoking status: Never Smoker  . Smokeless tobacco: Never Used  Substance and Sexual Activity  . Alcohol use: No  . Drug use: No  . Sexual activity: Never  Lifestyle  . Physical activity:    Days per week: Not on file    Minutes per session: Not on file  . Stress: Not at all  Relationships  . Social connections:    Talks on phone: Not on file    Gets together: Not on file    Attends religious service: Not on file    Active member of club or  organization: Not on file    Attends meetings of clubs or organizations: Not on file    Relationship status: Not on file  . Intimate partner violence:    Fear of current or ex partner: Not on file    Emotionally abused: Not on file    Physically abused: Not on file    Forced sexual activity: Not on file  Other Topics Concern  . Not on file  Social History Narrative   Lives at [facility], [place] since 04/25/2013   Widowed   Never smoked   Alcohol none   DNR, POA, Living Will    Functional Status Survey:    No family history on file.  Health Maintenance  Topic Date Due  . DEXA SCAN  07/18/1994  . TETANUS/TDAP  09/20/2026  . INFLUENZA VACCINE  Completed    Allergies  Allergen Reactions  . Hydrocodone Nausea And Vomiting  . Morphine And Related Nausea And Vomiting  . Actonel [Risedronate Sodium] Other (See Comments)    unknown  . Darvon [Propoxyphene Hcl] Other (See Comments)    unknown  . Oxycodone Hcl Other (See Comments)    unknown  . Pneumovax [Pneumococcal Polysaccharide Vaccine] Other (See Comments)    unknown    Outpatient Encounter Medications as of 11/09/2017  Medication Sig  . acetaminophen (TYLENOL) 325 MG tablet Take 650 mg by mouth every 4 (four) hours as needed.  Marland Kitchen atorvastatin (LIPITOR) 10 MG tablet Take 10 mg by mouth daily.  . bisacodyl (DULCOLAX) 10 MG suppository Place 10 mg rectally daily as needed for mild constipation or moderate constipation.  . hydrocortisone 2.5 % cream Apply 1 application topically 2 (two) times daily as needed. Apply to affected area on face  . loratadine (CLARITIN) 10 MG tablet Take 10 mg by mouth daily.  . metoprolol tartrate (LOPRESSOR) 25 MG tablet Take 12.5 mg by mouth 2 (two) times daily.   . mirtazapine (REMERON) 15 MG tablet Take 7.5 mg by mouth at bedtime.   Marland Kitchen nystatin (NYSTATIN) powder Apply topically daily as needed. Under left breast   . omeprazole (PRILOSEC) 10 MG capsule Take 10 mg by mouth daily.  .  polyethylene glycol (MIRALAX / GLYCOLAX) packet Take 17 g by mouth daily.  . risperiDONE (RISPERDAL) 0.25 MG tablet Take 0.25 mg by mouth 2 (two) times daily.  . Rivaroxaban (XARELTO) 15 MG TABS tablet Take 1 tablet (15 mg total) by mouth daily at 6 PM.  . sertraline (ZOLOFT) 100 MG tablet Take 1 tablet (100 mg total) by mouth daily at 6 PM.  . sertraline (ZOLOFT) 50 MG tablet Take 50 mg by mouth daily. Take with 100 mg = 150 mg  . Simethicone (GAS RELIEF 80 PO) Take 80 mg by mouth 3 (three) times daily with meals.   . [DISCONTINUED] omeprazole (PRILOSEC) 20 MG capsule Take 10  mg by mouth daily.    No facility-administered encounter medications on file as of 11/09/2017.     Review of Systems  Constitutional: Positive for activity change.  HENT: Negative.   Respiratory: Negative.   Cardiovascular: Negative.   Gastrointestinal: Negative.   Genitourinary: Negative.   Psychiatric/Behavioral: Positive for behavioral problems, confusion, decreased concentration and hallucinations.    Vitals:   11/09/17 1508  BP: 128/70  Pulse: 70  Resp: 16  Temp: 98 F (36.7 C)  SpO2: 96%  Weight: 157 lb (71.2 kg)  Height: 5\' 3"  (1.6 m)   Body mass index is 27.81 kg/m. Physical Exam  Constitutional: She appears well-developed and well-nourished.  Patient does not look wasted  HENT:  Head: Normocephalic.  Mouth/Throat: Oropharynx is clear and moist.  Eyes: Pupils are equal, round, and reactive to light. EOM are normal.  Neck: Normal range of motion.  Cardiovascular: Normal rate, regular rhythm and normal heart sounds.  Pulmonary/Chest: Effort normal and breath sounds normal.  Abdominal: Soft.  Musculoskeletal:  Nonambulatory  Neurological: She is alert.  Patient does respond to simple questions and smiles no spontaneous cessation  Skin: Skin is dry.  Psychiatric: She has a normal mood and affect.  Nursing note and vitals reviewed.   Labs reviewed: Basic Metabolic Panel: Recent Labs     12/17/16 07/14/17  NA 139 142  K 3.6 3.6  CL  --  112  CO2  --  22  BUN 12 11  CREATININE 1.0 1.0  CALCIUM  --  8.9   Liver Function Tests: Recent Labs    12/17/16 07/14/17  AST 17 18  ALT 14 20  ALKPHOS 101 87  PROT  --  6.0  ALBUMIN  --  4.0   No results for input(s): LIPASE, AMYLASE in the last 8760 hours. No results for input(s): AMMONIA in the last 8760 hours. CBC: Recent Labs    12/17/16 02/12/17 07/14/17  WBC 7.6 5.6 7.7  HGB 12.8 13.6 14.7  HCT 38 40 44  PLT 217 251 260   Cardiac Enzymes: No results for input(s): CKTOTAL, CKMB, CKMBINDEX, TROPONINI in the last 8760 hours. BNP: Invalid input(s): POCBNP Lab Results  Component Value Date   HGBA1C 5.5 12/02/2016   Lab Results  Component Value Date   TSH 1.56 02/12/2017   Lab Results  Component Value Date   VITAMINB12 192 07/14/2016   No results found for: FOLATE No results found for: IRON, TIBC, FERRITIN  Imaging and Procedures obtained prior to SNF admission: Dg Chest 2 View  Result Date: 07/13/2016 CLINICAL DATA:  Patient very confused and unable to provide history. Hallucinations and possible UTI. EXAM: CHEST  2 VIEW COMPARISON:  08/20/2014. FINDINGS: Low volume film. Cardiopericardial silhouette is at upper limits of normal for size. There is pulmonary vascular congestion without overt pulmonary edema. Subsegmental atelectasis noted left lung base. No pleural effusion. The visualized bony structures of the thorax are intact. IMPRESSION: Low lung volumes with borderline cardiomegaly and vascular congestion. Electronically Signed   By: Misty Stanley M.D.   On: 07/13/2016 14:54   Ct Head Wo Contrast  Result Date: 07/13/2016 CLINICAL DATA:  Hallucination, confusion. Possible urinary tract infection. History of hypertension and stroke. EXAM: CT HEAD WITHOUT CONTRAST TECHNIQUE: Contiguous axial images were obtained from the base of the skull through the vertex without intravenous contrast. COMPARISON:  None.  FINDINGS: BRAIN: No intraparenchymal hemorrhage, mass effect nor midline shift. LEFT temporoparietal and occipital lobe encephalomalacia with ex vacuo dilatation LEFT  atrium and occipital horn, no hydrocephalus. Patchy supratentorial white matter hypodensities exclusive of the aforementioned abnormality compatible with mild chronic small vessel ischemic disease. No acute large vascular territory infarct. Similar prominent RIGHT supratentorial cerebral spinal fluid space suggesting old hygroma without mass effect, stable from prior imaging. Basal cisterns are patent. VASCULAR: Mild calcific atherosclerosis of the carotid siphons. SKULL: No skull fracture. No significant scalp soft tissue swelling. SINUSES/ORBITS: The mastoid air-cells and included paranasal sinuses are well-aerated.The included ocular globes and orbital contents are non-suspicious. Status post bilateral ocular lens implants OTHER: None. IMPRESSION: 1. No acute intracranial process. 2. Stable examination including old LEFT PCA infarct and LEFT posterior watershed versus LEFT MCA territory infarcts. Electronically Signed   By: Elon Alas M.D.   On: 07/13/2016 16:03   Ct Abdomen Pelvis W Contrast  Result Date: 07/13/2016 CLINICAL DATA:  Possible urinary tract infection. Right lower quadrant pain. Confusion. EXAM: CT ABDOMEN AND PELVIS WITH CONTRAST TECHNIQUE: Multidetector CT imaging of the abdomen and pelvis was performed using the standard protocol following bolus administration of intravenous contrast. CONTRAST:  13mL ISOVUE-300 IOPAMIDOL (ISOVUE-300) INJECTION 61% COMPARISON:  None. FINDINGS: Lower chest: Bibasilar atelectasis. Cardiomegaly, accentuated by a pectus excavatum deformity. Tiny hiatal hernia. Hepatobiliary: Mild degradation secondary to patient arm position, not raised above the head. Mild hepatomegaly at 18.5 cm craniocaudal. Suspect mild hepatic steatosis. Too small to characterize right hepatic lobe lesion. Normal  gallbladder, without biliary ductal dilatation. Pancreas: Normal, without mass or ductal dilatation. Spleen: Normal in size, without focal abnormality. Adrenals/Urinary Tract: Normal adrenal glands. At least partially duplicated left renal collecting system. Mild right-sided hydroureteronephrosis. This continues to the level of the pelvic brim. A calcification in this region measures 4 mm, including on image 75/ series 7. Difficult to differentiate if this is vascular or within the ureter. The distal ureter is normal in caliber. Normal urinary bladder. Stomach/Bowel: Normal remainder of the stomach. Scattered colonic diverticula. Colonic stool burden suggests constipation. Normal terminal ileum. Appendectomy. Normal small bowel. Vascular/Lymphatic: Aortic and branch vessel atherosclerosis. No abdominopelvic adenopathy. Reproductive: Hysterectomy.  No adnexal mass. Other: No significant free fluid. Musculoskeletal: Lumbosacral spondylosis with disc bulge at L4-5. IMPRESSION: 1. Mild right-sided hydroureteronephrosis. This continues to the level of a calcification which is either within or adjacent (vascular) to the mid to distal right ureter. Given size transition in this area, favored to represent a ureteric stone. 2.  Possible constipation. 3.  Aortic Atherosclerosis (ICD10-I70.0). 4. Hepatomegaly and probable mild hepatic steatosis. Electronically Signed   By: Abigail Miyamoto M.D.   On: 07/13/2016 16:27    Assessment/Plan 1. Essential hypertension, benign Takes metoprolol 12.5 mg twice daily for blood pressure and pressure is good at 130/64  2. Cerebrovascular accident (CVA), unspecified mechanism ([place]) Related to protein S deficiency  3. Vascular dementia with behavior disturbance ([place]) Patient is not on memory preserving medicines appropriately but she does take large dose of sotalol off and risperidone for behaviors related to her dementia  4. Protein S deficiency ([place]) Patient has had strokes  secondary to this problem.  This is the reason she takes Xarelto 15 mg a day  5. Fall, initial encounter History of falls.  Use of Xarelto in the face of falls is concerning   Family/ staff Communication: Findings communicated to nursing staff and daughter who was present at the time of exam  Labs/tests ordered: none  Lillette Boxer. Sabra Heck, [place] [address] [place], [place] Office [phone]

## 2017-11-12 ENCOUNTER — Encounter: Payer: Self-pay | Admitting: Nurse Practitioner

## 2017-12-08 ENCOUNTER — Encounter: Payer: Self-pay | Admitting: Nurse Practitioner

## 2017-12-08 ENCOUNTER — Non-Acute Institutional Stay (SKILLED_NURSING_FACILITY): Payer: Medicare Other | Admitting: Nurse Practitioner

## 2017-12-08 DIAGNOSIS — K219 Gastro-esophageal reflux disease without esophagitis: Secondary | ICD-10-CM

## 2017-12-08 DIAGNOSIS — F0280 Dementia in other diseases classified elsewhere without behavioral disturbance: Secondary | ICD-10-CM

## 2017-12-08 DIAGNOSIS — F329 Major depressive disorder, single episode, unspecified: Secondary | ICD-10-CM | POA: Diagnosis not present

## 2017-12-08 DIAGNOSIS — D6859 Other primary thrombophilia: Secondary | ICD-10-CM | POA: Diagnosis not present

## 2017-12-08 DIAGNOSIS — I10 Essential (primary) hypertension: Secondary | ICD-10-CM

## 2017-12-08 DIAGNOSIS — K5909 Other constipation: Secondary | ICD-10-CM

## 2017-12-08 DIAGNOSIS — F0393 Unspecified dementia, unspecified severity, with mood disturbance: Secondary | ICD-10-CM

## 2017-12-08 NOTE — Progress Notes (Signed)
Location:  [facility] Room Number: 109 Place of Service:  SNF (31) Provider:  Marlana Latus NP  Lenola Lockner X, NP  Patient Care Team: Saba Neuman X, NP as PCP - General (Internal Medicine) Yocelyn Brocious X, NP as Nurse Practitioner (Internal Medicine) Dunn, Warnell Bureau as Consulting Physician (Optometry) Monna Fam, MD as Consulting Physician (Ophthalmology) Lucky Cowboy, Gordy Clement, NP (Inactive) as Nurse Practitioner [facility] and Palliative Medicine)  Extended Emergency Contact Information Primary Emergency Contact: [facility] Address: [address],  [address] [name] of [place] Phone: [phone] Work Phone: [phone] Mobile Phone: [phone] Relation: Daughter  Code Status:  DNR Goals of care: Advanced Directive information Advanced Directives 12/08/2017  Does Patient Have a Medical Advance Directive? Yes  Type of Paramedic of [place];Living will;Out of facility DNR (pink MOST or yellow form)  Does patient want to make changes to medical advance directive? No - Patient declined  Copy of [place] in Chart? Yes - validated most recent copy scanned in chart (See row information)  Pre-existing out of facility DNR order (yellow form or pink MOST form) Yellow form placed in chart (order not valid for inpatient use)     Chief Complaint  Patient presents with  . Medical Management of Chronic Issues    F/u- HTN, CVA, dementia, depression, constipation, GERD    HPI:  Pt is a 84 y.o. female seen today for medical management of chronic diseases.     The patient resides in memory care unit, [facility] for safety and care assistance. Her mood is stable on Sertraline 150mg  qd, Risperdal 0.25mg  bid, Mirtazapine 7.5mg  qd. Hx of protein C/S deficiency and DVT, on Xarelto 15mg  qd. GERD stable on Omeprazole 10mg  qd. HTN, blood pressure is controlled on Metoprolol 12.5mg  bid. No  constipation while on MiraLax qd, prn Bisacodyl 10mg  suppository.   Past Medical History:  Diagnosis Date  . Acute encephalopathy   . Aphasia following unspecified cerebrovascular disease   . 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 Depression   . Dysphagia following cerebrovascular disease   . Hepatic steatosis   . Hydroureteronephrosis   . Hypertension   . Insomnia   . Macular degeneration   . Major depressive disorder, recurrent ([place])   . Neuralgia and neuritis, unspecified (CODE)   . Nontraumatic intracranial hemorrhage ([place])   . Stroke ([place])   . 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   . 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 ABDOMINAL HYSTERECTOMY    . APPENDECTOMY    . BREAST SURGERY    . MASTECTOMY Right     Allergies  Allergen Reactions  . Hydrocodone Nausea And Vomiting  . Morphine And Related Nausea And Vomiting  . Actonel [Risedronate Sodium] Other (See Comments)    unknown  . Darvon [Propoxyphene Hcl] Other (See Comments)    unknown  . Oxycodone Hcl Other (See Comments)    unknown  . Pneumovax [Pneumococcal Polysaccharide Vaccine] Other (See Comments)    unknown    Outpatient Encounter Medications as of 12/08/2017  Medication Sig  . acetaminophen (TYLENOL) 325 MG tablet Take 650 mg by mouth every 4 (four) hours as needed.  . bisacodyl (DULCOLAX) 10 MG suppository Place 10 mg rectally daily as needed for mild constipation or moderate  constipation.  . hydrocortisone 2.5 % cream Apply 1 application topically 2 (two) times daily as needed. Apply to affected area on face  . hydrocortisone 2.5 % lotion Apply topically 2 (two) times daily. Apply to affected area for itching  . loratadine (CLARITIN) 10 MG tablet Take 10 mg by mouth daily.  . metoprolol tartrate (LOPRESSOR) 25 MG tablet Take 12.5 mg by mouth 2 (two) times daily.   .  mirtazapine (REMERON) 15 MG tablet Take 7.5 mg by mouth at bedtime.   Marland Kitchen nystatin (NYSTATIN) powder Apply topically daily as needed. Under left breast   . omeprazole (PRILOSEC) 10 MG capsule Take 10 mg by mouth daily.  . polyethylene glycol (MIRALAX / GLYCOLAX) packet Take 17 g by mouth daily.  . risperiDONE (RISPERDAL) 0.25 MG tablet Take 0.25 mg by mouth 2 (two) times daily.  . Rivaroxaban (XARELTO) 15 MG TABS tablet Take 1 tablet (15 mg total) by mouth daily at 6 PM.  . sertraline (ZOLOFT) 100 MG tablet Take 1 tablet (100 mg total) by mouth daily at 6 PM. (Patient taking differently: Take 100 mg by mouth at bedtime. )  . sertraline (ZOLOFT) 50 MG tablet Take 50 mg by mouth at bedtime. Take with 100 mg = 150 mg   . Simethicone (GAS RELIEF 80 PO) Take 80 mg by mouth 3 (three) times daily with meals.   . [DISCONTINUED] atorvastatin (LIPITOR) 10 MG tablet Take 10 mg by mouth daily.   No facility-administered encounter medications on file as of 12/08/2017.    ROS was provided with assistance of staff Review of Systems  Constitutional: Negative for activity change, appetite change, chills, diaphoresis, fatigue and fever.       #3 Ibs weight loss in the past month, her BMI 27.   HENT: Positive for hearing loss. Negative for congestion and voice change.   Respiratory: Negative for cough, shortness of breath and wheezing.   Cardiovascular: Positive for leg swelling. Negative for chest pain and palpitations.  Gastrointestinal: Negative for abdominal distention, abdominal pain, constipation, diarrhea, nausea and vomiting.  Genitourinary: Negative for difficulty urinating, dysuria and urgency.  Musculoskeletal: Positive for gait problem.  Skin: Negative for color change and pallor.  Neurological: Negative for dizziness, speech difficulty, weakness and headaches.       Dementia  Psychiatric/Behavioral: Positive for confusion. Negative for agitation, hallucinations and sleep disturbance. The patient is  not nervous/anxious.     Immunization History  Administered Date(s) Administered  . Influenza Whole 10/19/2017  . Influenza-Unspecified 11/20/2013, 09/26/2014, 10/24/2015, 11/03/2016  . Tdap 09/19/2016   Pertinent  Health Maintenance Due  Topic Date Due  . INFLUENZA VACCINE  Completed  . DEXA SCAN  Discontinued   Fall Risk  09/15/2017 09/05/2016  Falls in the past year? No No   Functional Status Survey:    Vitals:   12/08/17 1028  BP: 140/82  Pulse: 75  Resp: 20  Temp: (!) 97.4 F (36.3 C)  SpO2: 96%  Weight: 154 lb 6.4 oz (70 kg)  Height: 5\' 3"  (1.6 m)   Body mass index is 27.35 kg/m. Physical Exam  Constitutional: She appears well-developed and well-nourished.  HENT:  Head: Normocephalic and atraumatic.  Eyes: Pupils are equal, round, and reactive to light. EOM are normal.  Neck: Normal range of motion. Neck supple. No JVD present. No thyromegaly present.  Cardiovascular: Normal rate and regular rhythm.  No murmur heard. Pulmonary/Chest: Effort normal. She has no wheezes. She has no rales.  Abdominal: Soft. She exhibits  no distension. There is no tenderness. There is no rebound and no guarding.  Musculoskeletal: She exhibits edema.  Chronic trace edema BLE remains no change. Ambulates with walker.   Neurological: She is alert. No cranial nerve deficit. She exhibits normal muscle tone. Coordination normal.  Oriented to person and her room on unit.   Skin: Skin is warm and dry.  Psychiatric: She has a normal mood and affect. Her behavior is normal.  Upon my visit today.     Labs reviewed: Recent Labs    12/17/16 07/14/17  NA 139 142  K 3.6 3.6  CL  --  112  CO2  --  22  BUN 12 11  CREATININE 1.0 1.0  CALCIUM  --  8.9   Recent Labs    12/17/16 07/14/17  AST 17 18  ALT 14 20  ALKPHOS 101 87  PROT  --  6.0  ALBUMIN  --  4.0   Recent Labs    12/17/16 02/12/17 07/14/17  WBC 7.6 5.6 7.7  HGB 12.8 13.6 14.7  HCT 38 40 44  PLT 217 251 260   Lab  Results  Component Value Date   TSH 1.56 02/12/2017   Lab Results  Component Value Date   HGBA1C 5.5 12/02/2016   Lab Results  Component Value Date   CHOL 174 10/21/2016   HDL 45 10/21/2016   LDLCALC 103 10/21/2016   TRIG 158 10/21/2016    Significant Diagnostic Results in last 30 days:  No results found.  Assessment/Plan Hypertension Blood pressure is in control, continue Metoprolol 12.5mg  bid.   GERD (gastroesophageal reflux disease) Stable, continue Omeprazole 10mg  qd.   Depression due to dementia Her mood is stable, continue memory care unit [facility] for safety and care assistance, continue Risperdal 0.25mg  bid, Sertraline 150mg  qd, Mirtazapine 7.5mg  qd.   Protein C deficiency ([place]) Continue long term anticoagulation therapy, continue Xarelto 15mg  qd.   Chronic constipation Stable, continue MiraLax qd, prn Bisacodyl 10mg  suppository daily.      Family/ staff Communication: plan of care reviewed with the patient and charge nurse.   Labs/tests ordered:  none  Time spend 25 minutes.

## 2017-12-08 NOTE — Assessment & Plan Note (Signed)
Blood pressure is in control, continue Metoprolol 12.5mg bid.  

## 2017-12-08 NOTE — Assessment & Plan Note (Signed)
Her mood is stable, continue memory care unit [facility] for safety and care assistance, continue Risperdal 0.25mg  bid, Sertraline 150mg  qd, Mirtazapine 7.5mg  qd.

## 2017-12-08 NOTE — Assessment & Plan Note (Signed)
Continue long term anticoagulation therapy, continue Xarelto 15mg  qd.

## 2017-12-08 NOTE — Assessment & Plan Note (Signed)
Stable, continue MiraLax qd, prn Bisacodyl 10mg  suppository daily.

## 2017-12-08 NOTE — Assessment & Plan Note (Signed)
Stable, continue Omeprazole 10mg qd.  

## 2017-12-09 ENCOUNTER — Encounter: Payer: Self-pay | Admitting: Nurse Practitioner

## 2018-01-08 ENCOUNTER — Encounter: Payer: Self-pay | Admitting: Nurse Practitioner

## 2018-01-08 ENCOUNTER — Non-Acute Institutional Stay (SKILLED_NURSING_FACILITY): Payer: Medicare Other | Admitting: Nurse Practitioner

## 2018-01-08 DIAGNOSIS — K219 Gastro-esophageal reflux disease without esophagitis: Secondary | ICD-10-CM

## 2018-01-08 DIAGNOSIS — F0280 Dementia in other diseases classified elsewhere without behavioral disturbance: Secondary | ICD-10-CM

## 2018-01-08 DIAGNOSIS — K5909 Other constipation: Secondary | ICD-10-CM

## 2018-01-08 DIAGNOSIS — I10 Essential (primary) hypertension: Secondary | ICD-10-CM | POA: Diagnosis not present

## 2018-01-08 DIAGNOSIS — I8291 Chronic embolism and thrombosis of unspecified vein: Secondary | ICD-10-CM

## 2018-01-08 DIAGNOSIS — F329 Major depressive disorder, single episode, unspecified: Secondary | ICD-10-CM | POA: Diagnosis not present

## 2018-01-08 DIAGNOSIS — F29 Unspecified psychosis not due to a substance or known physiological condition: Secondary | ICD-10-CM

## 2018-01-08 DIAGNOSIS — F0393 Unspecified dementia, unspecified severity, with mood disturbance: Secondary | ICD-10-CM

## 2018-01-08 NOTE — Assessment & Plan Note (Signed)
Chronic anticoagulation, continue Rivaroxaban 15mg  qd.

## 2018-01-08 NOTE — Assessment & Plan Note (Signed)
Stable, continue Risperdal 0.25mg bid.  

## 2018-01-08 NOTE — Assessment & Plan Note (Signed)
Stable, continue Omeprazole 10mg qd.  

## 2018-01-08 NOTE — Progress Notes (Signed)
Location:  [place] Room Number: 109 Place of Service:  SNF (31) Provider:  Marlana Latus  NP  Daunte Oestreich X, NP  Patient Care Team: Cliffie Gingras X, NP as PCP - General (Internal Medicine) Kayvon Mo X, NP as Nurse Practitioner (Internal Medicine) Dunn, Warnell Bureau as Consulting Physician (Optometry) Monna Fam, MD as Consulting Physician (Ophthalmology) Lucky Cowboy, Gordy Clement, NP (Inactive) as Nurse Practitioner [facility] and Palliative Medicine)  Extended Emergency Contact Information Primary Emergency Contact: [facility] Address: [address], [place] [address] [name] of [place] Phone: [phone] Work Phone: [phone] Mobile Phone: [phone] Relation: Daughter  Code Status:  DNR Goals of care: Advanced Directive information Advanced Directives 01/08/2018  Does Patient Have a Medical Advance Directive? Yes  Type of Paramedic of [place];Living will;Out of facility DNR (pink MOST or yellow form)  Does patient want to make changes to medical advance directive? -  Copy of [place] in Chart? Yes - validated most recent copy scanned in chart (See row information)  Pre-existing out of facility DNR order (yellow form or pink MOST form) Yellow form placed in chart (order not valid for inpatient use)     Chief Complaint  Patient presents with  . Medical Management of Chronic Issues    HPI:  Pt is a 85 y.o. female seen today for medical management of chronic diseases.    The patient has history of dementia, resides in memory care unit [facility]. Psychosis is stable on Risperdal 0.25mg  bid. Her mood is stable on Sertraline 150mg  qd, Mirtazapine 7.5mg  qd.  Chronic anticoagulation on Rivaroxaban 15mg  qd. Constipation is managed on MiraLax daily. GERD stable on Omeprazole 10mg  qd. HTN, blood pressure is controlled on Metoprolol 12.5mg  bid.   Past Medical History:  Diagnosis Date   . Acute encephalopathy   . Aphasia following unspecified cerebrovascular disease   . 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 Depression   . Dysphagia following cerebrovascular disease   . Hepatic steatosis   . Hydroureteronephrosis   . Hypertension   . Insomnia   . Macular degeneration   . Major depressive disorder, recurrent ([place])   . Neuralgia and neuritis, unspecified (CODE)   . Nontraumatic intracranial hemorrhage ([place])   . Stroke ([place])   . 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   . 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 ABDOMINAL HYSTERECTOMY    . APPENDECTOMY    . BREAST SURGERY    . MASTECTOMY Right     Allergies  Allergen Reactions  . Hydrocodone Nausea And Vomiting  . Morphine And Related Nausea And Vomiting  . Actonel [Risedronate Sodium] Other (See Comments)    unknown  . Darvon [Propoxyphene Hcl] Other (See Comments)    unknown  . Oxycodone Hcl Other (See Comments)    unknown  . Pneumovax [Pneumococcal Polysaccharide Vaccine] Other (See Comments)    unknown    Outpatient Encounter Medications as of 01/08/2018  Medication Sig  . acetaminophen (TYLENOL) 325 MG tablet Take 650 mg by mouth every 4 (four) hours as needed.  . bisacodyl (DULCOLAX) 10 MG suppository Place 10 mg rectally daily as needed for mild constipation or moderate constipation.  . hydrocortisone 2.5 % lotion Apply topically 2 (two) times daily. Apply to itchy  area  on face.  . loratadine (CLARITIN) 10 MG tablet Take 10 mg by mouth daily.  . metoprolol tartrate (LOPRESSOR) 25 MG tablet Take 12.5 mg by mouth 2 (two) times daily.   . mirtazapine (REMERON) 15 MG tablet Take 7.5 mg by mouth at bedtime.   Marland Kitchen nystatin (NYSTATIN) powder Apply topically daily as needed. Under left breast   . omeprazole (PRILOSEC) 10 MG capsule Take 10 mg by mouth daily.  . polyethylene glycol  (MIRALAX / GLYCOLAX) packet Take 17 g by mouth daily.  . risperiDONE (RISPERDAL) 0.25 MG tablet Take 0.25 mg by mouth 2 (two) times daily.  . Rivaroxaban (XARELTO) 15 MG TABS tablet Take 1 tablet (15 mg total) by mouth daily at 6 PM.  . sertraline (ZOLOFT) 100 MG tablet Take 1 tablet (100 mg total) by mouth daily at 6 PM. (Patient taking differently: Take 100 mg by mouth at bedtime. Take 50 mg + 100 mg =150 mg)  . sertraline (ZOLOFT) 50 MG tablet Take 50 mg by mouth at bedtime. Take with 100 mg = 150 mg   . Simethicone (GAS RELIEF 80 PO) Take 80 mg by mouth 3 (three) times daily with meals.   . [DISCONTINUED] hydrocortisone 2.5 % cream Apply 1 application topically 2 (two) times daily as needed. Apply to affected area on face   No facility-administered encounter medications on file as of 01/08/2018.    ROS was provided with assistance of staff Review of Systems  Constitutional: Negative for activity change, appetite change, chills, diaphoresis, fatigue, fever and unexpected weight change.  HENT: Positive for hearing loss. Negative for congestion and voice change.   Respiratory: Negative for cough, shortness of breath and wheezing.   Cardiovascular: Positive for leg swelling.  Gastrointestinal: Negative for abdominal distention, abdominal pain, constipation and vomiting.  Genitourinary: Negative for difficulty urinating, dysuria and urgency.  Musculoskeletal: Positive for arthralgias and gait problem.  Skin: Negative for color change and pallor.  Neurological: Negative for dizziness, speech difficulty, weakness and headaches.       Dementia  Psychiatric/Behavioral: Positive for confusion. Negative for agitation, behavioral problems, hallucinations and sleep disturbance. The patient is not nervous/anxious.     Immunization History  Administered Date(s) Administered  . Influenza Whole 10/19/2017  . Influenza-Unspecified 11/20/2013, 09/26/2014, 10/24/2015, 11/03/2016  . Tdap 09/19/2016    Pertinent  Health Maintenance Due  Topic Date Due  . INFLUENZA VACCINE  Completed  . DEXA SCAN  Discontinued   Fall Risk  09/15/2017 09/05/2016  Falls in the past year? No No   Functional Status Survey:    Vitals:   01/08/18 1237  BP: 140/80  Pulse: 73  Resp: 16  Temp: (!) 97.2 F (36.2 C)  SpO2: 96%  Weight: 156 lb 6.4 oz (70.9 kg)  Height: 5\' 3"  (1.6 m)   Body mass index is 27.71 kg/m. Physical Exam Constitutional:      General: She is not in acute distress.    Appearance: Normal appearance. She is not ill-appearing, toxic-appearing or diaphoretic.     Comments: Over weight  HENT:     Head: Normocephalic and atraumatic.     Nose: Nose normal.     Mouth/Throat:     Mouth: Mucous membranes are moist.  Eyes:     Extraocular Movements: Extraocular movements intact.     Pupils: Pupils are equal, round, and reactive to light.  Neck:     Musculoskeletal: Normal range of motion and neck supple.  Cardiovascular:     Rate and  Rhythm: Normal rate and regular rhythm.     Heart sounds: No murmur.  Pulmonary:     Effort: Pulmonary effort is normal.     Breath sounds: Normal breath sounds. No wheezing, rhonchi or rales.  Abdominal:     General: There is no distension.     Palpations: Abdomen is soft.     Tenderness: There is no abdominal tenderness. There is no guarding or rebound.  Musculoskeletal:     Right lower leg: Edema present.     Left lower leg: Edema present.     Comments: Trace edema BLE  Skin:    General: Skin is warm and dry.     Coloration: Skin is not pale.     Findings: No erythema or rash.  Neurological:     General: No focal deficit present.     Mental Status: She is alert. Mental status is at baseline.     Cranial Nerves: No cranial nerve deficit.     Motor: No weakness.     Comments: Oriented to person and her room on unit.   Psychiatric:        Mood and Affect: Mood normal.        Behavior: Behavior normal.     Labs reviewed: Recent Labs     07/14/17  NA 142  K 3.6  CL 112  CO2 22  BUN 11  CREATININE 1.0  CALCIUM 8.9   Recent Labs    07/14/17  AST 18  ALT 20  ALKPHOS 87  PROT 6.0  ALBUMIN 4.0   Recent Labs    02/12/17 07/14/17  WBC 5.6 7.7  HGB 13.6 14.7  HCT 40 44  PLT 251 260   Lab Results  Component Value Date   TSH 1.56 02/12/2017   Lab Results  Component Value Date   HGBA1C 5.5 12/02/2016   Lab Results  Component Value Date   CHOL 174 10/21/2016   HDL 45 10/21/2016   LDLCALC 103 10/21/2016   TRIG 158 10/21/2016    Significant Diagnostic Results in last 30 days:  No results found.  Assessment/Plan Hypertension Blood pressure is controlled on Metoprolol 12.5mg  bid.   Chronic embolism and thrombosis of unspecified vein Chronic anticoagulation, continue Rivaroxaban 15mg  qd.   GERD (gastroesophageal reflux disease) Stable, continue Omeprazole 10mg  qd.   Depression due to dementia Stable, continue MIrtazapine 7.5mg  qd, Sertraline 150mg  qd.   Psychosis Stable, continue Risperdal 0.25mg  bid.   Chronic constipation Stable, continue MiraLax daily.      Family/ staff Communication: plan of care reviewed with the patient and charge nurse.   Labs/tests ordered:  none  Time spend 25 minutes.

## 2018-01-08 NOTE — Assessment & Plan Note (Signed)
Blood pressure is controlled on Metoprolol 12.5mg  bid.

## 2018-01-08 NOTE — Assessment & Plan Note (Signed)
Stable, continue MiraLax daily.  

## 2018-01-08 NOTE — Assessment & Plan Note (Signed)
Stable, continue MIrtazapine 7.5mg  qd, Sertraline 150mg  qd.

## 2018-01-11 ENCOUNTER — Encounter: Payer: Medicare Other | Admitting: Nurse Practitioner

## 2018-02-12 ENCOUNTER — Non-Acute Institutional Stay (SKILLED_NURSING_FACILITY): Payer: Medicare Other | Admitting: Internal Medicine

## 2018-02-12 ENCOUNTER — Encounter: Payer: Self-pay | Admitting: Internal Medicine

## 2018-02-12 DIAGNOSIS — K219 Gastro-esophageal reflux disease without esophagitis: Secondary | ICD-10-CM | POA: Diagnosis not present

## 2018-02-12 DIAGNOSIS — I10 Essential (primary) hypertension: Secondary | ICD-10-CM | POA: Diagnosis not present

## 2018-02-12 DIAGNOSIS — F329 Major depressive disorder, single episode, unspecified: Secondary | ICD-10-CM

## 2018-02-12 DIAGNOSIS — F0151 Vascular dementia with behavioral disturbance: Secondary | ICD-10-CM

## 2018-02-12 DIAGNOSIS — F01518 Vascular dementia, unspecified severity, with other behavioral disturbance: Secondary | ICD-10-CM

## 2018-02-12 DIAGNOSIS — F0280 Dementia in other diseases classified elsewhere without behavioral disturbance: Secondary | ICD-10-CM

## 2018-02-12 DIAGNOSIS — I8291 Chronic embolism and thrombosis of unspecified vein: Secondary | ICD-10-CM

## 2018-02-12 DIAGNOSIS — F0393 Unspecified dementia, unspecified severity, with mood disturbance: Secondary | ICD-10-CM

## 2018-02-12 NOTE — Progress Notes (Signed)
Location:  [place] Room Number: [place]:  SNF (31) Provider:    Mast, Man X, NP  Patient Care Team: Mast, Man X, NP as PCP - General (Internal Medicine) Mast, Man X, NP as Nurse Practitioner (Internal Medicine) Dunn, Warnell Bureau as Consulting Physician (Optometry) Monna Fam, MD as Consulting Physician (Ophthalmology) Lucky Cowboy, Gordy Clement, NP as Nurse Practitioner [facility] and Palliative Medicine)  Extended Emergency Contact Information Primary Emergency Contact: [facility] Address: [address], [place] [address] [name] of [place] Phone: [phone] Work Phone: [phone] Mobile Phone: [phone] Relation: Daughter  Code Status:  DNR Goals of care: Advanced Directive information Advanced Directives 02/12/2018  Does Patient Have a Medical Advance Directive? Yes  Type of Paramedic of [place];Out of facility DNR (pink MOST or yellow form)  Does patient want to make changes to medical advance directive? No - Patient declined  Copy of [place] in Chart? Yes - validated most recent copy scanned in chart (See row information)  Pre-existing out of facility DNR order (yellow form or pink MOST form) Yellow form placed in chart (order not valid for inpatient use)     Chief Complaint  Patient presents with  . Medical Management of Chronic Issues    routine visit     HPI:  Pt is a 85 y.o. female seen today for medical management of chronic diseases.   Patient has a history of dementia with behavior issues, depression, history of chronic thrombosis on Xarelto, gastritis.  Patient unable to give me any history.  Per her nurse patient continues to have bad days in which she does not get out of the bed is very paranoid.  We are unable to taper her off of Risperdal because she resists care by nurses.  On good days patient would get out of her bed and walk  with a walker. Today patient was in her room would not talk to me.  And would was resisting the exam.  She says she does not know me.  The nurse was also in the room.  Patient looks very anxious and paranoid Patient's weight is stable at 157 pounds. No Other Nursing Issues  Past Medical History:  Diagnosis Date  . Acute encephalopathy   . Aphasia following unspecified cerebrovascular disease   . 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 Depression   . Dysphagia following cerebrovascular disease   . Hepatic steatosis   . Hydroureteronephrosis   . Hypertension   . Insomnia   . Macular degeneration   . Major depressive disorder, recurrent ([place])   . Neuralgia and neuritis, unspecified (CODE)   . Nontraumatic intracranial hemorrhage ([place])   . Stroke ([place])   . 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   . 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 ABDOMINAL HYSTERECTOMY    . APPENDECTOMY    . BREAST SURGERY    . MASTECTOMY Right     Allergies  Allergen Reactions  . Hydrocodone Nausea And Vomiting  . Morphine And Related Nausea And Vomiting  . Actonel [Risedronate Sodium] Other (See Comments)    unknown  . Darvon [Propoxyphene Hcl] Other (See Comments)    unknown  . Oxycodone Hcl Other (See Comments)    unknown  . Pneumovax [Pneumococcal  Polysaccharide Vaccine] Other (See Comments)    unknown    Outpatient Encounter Medications as of 02/12/2018  Medication Sig  . acetaminophen (TYLENOL) 325 MG tablet Take 650 mg by mouth every 4 (four) hours as needed.  . bisacodyl (DULCOLAX) 10 MG suppository Place 10 mg rectally daily as needed for mild constipation or moderate constipation.  . hydrocortisone 2.5 % lotion Apply topically 2 (two) times daily. Apply to itchy  area on face.  . loratadine (CLARITIN) 10 MG tablet Take 10 mg by mouth daily.  . metoprolol tartrate  (LOPRESSOR) 25 MG tablet Take 12.5 mg by mouth 2 (two) times daily.   . mirtazapine (REMERON) 15 MG tablet Take 7.5 mg by mouth at bedtime.   Marland Kitchen nystatin (NYSTATIN) powder Apply topically daily as needed. Under left breast   . omeprazole (PRILOSEC) 10 MG capsule Take 10 mg by mouth daily.  . polyethylene glycol (MIRALAX / GLYCOLAX) packet Take 17 g by mouth daily.  . risperiDONE (RISPERDAL) 0.25 MG tablet Take 0.25 mg by mouth 2 (two) times daily.  . Rivaroxaban (XARELTO) 15 MG TABS tablet Take 1 tablet (15 mg total) by mouth daily at 6 PM.  . sertraline (ZOLOFT) 100 MG tablet Take 1 tablet (100 mg total) by mouth daily at 6 PM. (Patient taking differently: Take 100 mg by mouth at bedtime. Take 50 mg + 100 mg =150 mg)  . sertraline (ZOLOFT) 50 MG tablet Take 50 mg by mouth at bedtime. Take with 100 mg = 150 mg   . Simethicone (GAS RELIEF 80 PO) Take 80 mg by mouth 3 (three) times daily with meals.    No facility-administered encounter medications on file as of 02/12/2018.     Review of Systems  Unable to perform ROS: Dementia    Immunization History  Administered Date(s) Administered  . Influenza Whole 10/19/2017  . Influenza-Unspecified 11/20/2013, 09/26/2014, 10/24/2015, 11/03/2016  . Tdap 09/19/2016   Pertinent  Health Maintenance Due  Topic Date Due  . INFLUENZA VACCINE  Completed  . DEXA SCAN  Discontinued   Fall Risk  09/15/2017 09/05/2016  Falls in the past year? No No   Functional Status Survey:    Vitals:   02/12/18 1105  BP: (!) 146/78  Pulse: 80  Temp: 98.5 F (36.9 C)  SpO2: 96%  Weight: 157 lb 3.2 oz (71.3 kg)  Height: 5\' 3"  (1.6 m)   Body mass index is 27.85 kg/m. Physical Exam Vitals signs reviewed.  Constitutional:      Appearance: Normal appearance.  HENT:     Head: Normocephalic.     Nose: Nose normal.     Mouth/Throat:     Mouth: Mucous membranes are moist.     Pharynx: Oropharynx is clear.  Eyes:     Pupils: Pupils are equal, round, and  reactive to light.  Neck:     Musculoskeletal: Neck supple.  Cardiovascular:     Rate and Rhythm: Normal rate and regular rhythm.     Pulses: Normal pulses.     Heart sounds: Normal heart sounds.  Pulmonary:     Effort: Pulmonary effort is normal. No respiratory distress.     Breath sounds: Normal breath sounds. No wheezing.  Abdominal:     General: Abdomen is flat. Bowel sounds are normal.     Palpations: Abdomen is soft.  Musculoskeletal:        General: No swelling.  Skin:    General: Skin is warm and dry.  Neurological:  General: No focal deficit present.     Mental Status: She is alert.     Comments: Not Oriented. Would follow some commands for Nurses  Psychiatric:        Mood and Affect: Mood is anxious.        Speech: Speech is delayed.        Behavior: Behavior is withdrawn.     Labs reviewed: Recent Labs    07/14/17  NA 142  K 3.6  CL 112  CO2 22  BUN 11  CREATININE 1.0  CALCIUM 8.9   Recent Labs    07/14/17  AST 18  ALT 20  ALKPHOS 87  PROT 6.0  ALBUMIN 4.0   Recent Labs    07/14/17  WBC 7.7  HGB 14.7  HCT 44  PLT 260   Lab Results  Component Value Date   TSH 1.56 02/12/2017   Lab Results  Component Value Date   HGBA1C 5.5 12/02/2016   Lab Results  Component Value Date   CHOL 174 10/21/2016   HDL 45 10/21/2016   LDLCALC 103 10/21/2016   TRIG 158 10/21/2016    Significant Diagnostic Results in last 30 days:  No results found.  Assessment/Plan Essential hypertension On Low dose of Metoprolol  Chronic embolism and thrombosis  On Chronic Xarelto  GERD Continue on omeprazole  Vascular dementia with behavior disturbance  On Risperdal Not candidate for GDR due to her behavior issues  Depression due to dementia  Continue on Zoloft Continue supportive care       Family/ staff Communication:   Labs/tests ordered:    Total time spent in this patient care encounter was 25_ minutes; greater than 50% of the visit  spent counseling patient, reviewing records , Labs and coordinating care for problems addressed at this encounter.

## 2018-02-16 LAB — CBC AND DIFFERENTIAL
HCT: 40 (ref 36–46)
HCT: 40 (ref 36–46)
HCT: 40 (ref 36–46)
Hemoglobin: 12.5 (ref 12.0–16.0)
Hemoglobin: 13.5 (ref 12.0–16.0)
Hemoglobin: 13.5 (ref 12.0–16.0)
PLATELETS: 220 (ref 150–399)
PLATELETS: 220 (ref 150–399)
PLATELETS: 220 (ref 150–399)
WBC: 5.6
WBC: 5.6
WBC: 5.6

## 2018-02-16 LAB — TSH: TSH: 1.32 (ref <=5.90)

## 2018-02-19 ENCOUNTER — Other Ambulatory Visit: Payer: Self-pay | Admitting: *Deleted

## 2018-03-02 ENCOUNTER — Non-Acute Institutional Stay (SKILLED_NURSING_FACILITY): Payer: Medicare Other | Admitting: Internal Medicine

## 2018-03-02 ENCOUNTER — Encounter: Payer: Self-pay | Admitting: Internal Medicine

## 2018-03-02 DIAGNOSIS — F0151 Vascular dementia with behavioral disturbance: Secondary | ICD-10-CM

## 2018-03-02 DIAGNOSIS — F0280 Dementia in other diseases classified elsewhere without behavioral disturbance: Secondary | ICD-10-CM

## 2018-03-02 DIAGNOSIS — F01518 Vascular dementia, unspecified severity, with other behavioral disturbance: Secondary | ICD-10-CM

## 2018-03-02 DIAGNOSIS — F0393 Unspecified dementia, unspecified severity, with mood disturbance: Secondary | ICD-10-CM

## 2018-03-02 DIAGNOSIS — I10 Essential (primary) hypertension: Secondary | ICD-10-CM | POA: Diagnosis not present

## 2018-03-02 DIAGNOSIS — F329 Major depressive disorder, single episode, unspecified: Secondary | ICD-10-CM

## 2018-03-02 NOTE — Progress Notes (Signed)
Location:    [facility] Room Number: 109 Place of Service:  SNF (31) Provider:    Mast, Man X, NP  Patient Care Team: Mast, Man X, NP as PCP - General (Internal Medicine) Mast, Man X, NP as Nurse Practitioner (Internal Medicine) Dunn, Warnell Bureau as Consulting Physician (Optometry) Monna Fam, MD as Consulting Physician (Ophthalmology) Lucky Cowboy, Gordy Clement, NP as Nurse Practitioner [facility] and Palliative Medicine)  Extended Emergency Contact Information Primary Emergency Contact: [facility] Address: [address], [place] [address] [name] of [place] Phone: [phone] Work Phone: [phone] Mobile Phone: [phone] Relation: Daughter  Code Status:  DNR Goals of care: Advanced Directive information Advanced Directives 03/02/2018  Does Patient Have a Medical Advance Directive? Yes  Type of Paramedic of [place];Out of facility DNR (pink MOST or yellow form)  Does patient want to make changes to medical advance directive? No - Patient declined  Copy of [place] in Chart? Yes - validated most recent copy scanned in chart (See row information)  Pre-existing out of facility DNR order (yellow form or pink MOST form) Yellow form placed in chart (order not valid for inpatient use)     Chief Complaint  Patient presents with  . Medical Management of Chronic Issues    Review her Antipsychotic Meds    HPI:  Pt is a 85 y.o. female seen today for an acute visit to review need for her Antipsychotic Meds  Patient has a history of dementia with behavior issues, depression, history of chronic thrombosis on Xarelto, gastritis. Patient unable to give any history. Per nurses she has been stable but does have history of Behavior Issues. She Cries stays up at night Gets Paranoid and will resists Care Today patient was calm No Other Acute issues Past Medical History:  Diagnosis Date  . Acute  encephalopathy   . Aphasia following unspecified cerebrovascular disease   . 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 Depression   . Dysphagia following cerebrovascular disease   . Hepatic steatosis   . Hydroureteronephrosis   . Hypertension   . Insomnia   . Macular degeneration   . Major depressive disorder, recurrent ([place])   . Neuralgia and neuritis, unspecified ([place])   . Nontraumatic intracranial hemorrhage ([place])   . Stroke ([place])   . 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   . 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 ABDOMINAL HYSTERECTOMY    . APPENDECTOMY    . BREAST SURGERY    . MASTECTOMY Right     Allergies  Allergen Reactions  . Hydrocodone Nausea And Vomiting  . Morphine And Related Nausea And Vomiting  . Actonel [Risedronate Sodium] Other (See Comments)    unknown  . Darvon [Propoxyphene Hcl] Other (See Comments)    unknown  . Oxycodone Hcl Other (See Comments)    unknown  . Pneumovax [Pneumococcal Polysaccharide Vaccine] Other (See Comments)    unknown    Outpatient Encounter Medications as of 03/02/2018  Medication Sig  . acetaminophen (TYLENOL) 325 MG tablet Take 650 mg by mouth every 4 (four) hours as needed.  . bisacodyl (DULCOLAX) 10 MG suppository Place 10 mg rectally daily as needed for mild constipation or moderate constipation.  . hydrocortisone 2.5 % lotion Apply topically 2 (two) times daily. Apply to  itchy  area on face.  . loratadine (CLARITIN) 10 MG tablet Take 10 mg by mouth daily.  . metoprolol tartrate (LOPRESSOR) 25 MG tablet Take 12.5 mg by mouth 2 (two) times daily.   . mirtazapine (REMERON) 15 MG tablet Take 7.5 mg by mouth at bedtime.   Marland Kitchen nystatin (NYSTATIN) powder Apply topically daily as needed. Under left breast   . omeprazole (PRILOSEC) 10 MG capsule Take 10 mg by mouth daily.  . polyethylene glycol (MIRALAX /  GLYCOLAX) packet Take 17 g by mouth daily.  . risperiDONE (RISPERDAL) 0.25 MG tablet Take 0.25 mg by mouth 2 (two) times daily.  . Rivaroxaban (XARELTO) 15 MG TABS tablet Take 1 tablet (15 mg total) by mouth daily at 6 PM.  . sertraline (ZOLOFT) 100 MG tablet Take 1 tablet (100 mg total) by mouth daily at 6 PM. (Patient taking differently: Take 100 mg by mouth at bedtime. Take 50 mg + 100 mg =150 mg)  . sertraline (ZOLOFT) 50 MG tablet Take 50 mg by mouth at bedtime. Take with 100 mg = 150 mg   . Simethicone (GAS RELIEF 80 PO) Take 80 mg by mouth 3 (three) times daily with meals.    No facility-administered encounter medications on file as of 03/02/2018.     Review of Systems  Unable to perform ROS: Dementia    Immunization History  Administered Date(s) Administered  . Influenza Whole 10/19/2017  . Influenza-Unspecified 11/20/2013, 09/26/2014, 10/24/2015, 11/03/2016  . Tdap 09/19/2016   Pertinent  Health Maintenance Due  Topic Date Due  . INFLUENZA VACCINE  Completed  . DEXA SCAN  Discontinued   Fall Risk  09/15/2017 09/05/2016  Falls in the past year? No No   Functional Status Survey:    Vitals:   03/02/18 1245  BP: 130/72  Pulse: 67  Resp: 16  Temp: 98.5 F (36.9 C)  Weight: 157 lb 3.2 oz (71.3 kg)  Height: 5\' 3"  (1.6 m)   Body mass index is 27.85 kg/m. Physical Exam Vitals signs reviewed.  Constitutional:      Appearance: Normal appearance.  HENT:     Head: Normocephalic.     Nose: Nose normal.     Mouth/Throat:     Mouth: Mucous membranes are moist.     Pharynx: Oropharynx is clear.  Eyes:     Pupils: Pupils are equal, round, and reactive to light.  Neck:     Musculoskeletal: Neck supple.  Cardiovascular:     Rate and Rhythm: Normal rate and regular rhythm.     Pulses: Normal pulses.     Heart sounds: Normal heart sounds.  Pulmonary:     Effort: Pulmonary effort is normal. No respiratory distress.     Breath sounds: Normal breath sounds. No wheezing.    Abdominal:     General: Abdomen is flat. Bowel sounds are normal.     Palpations: Abdomen is soft.  Musculoskeletal:        General: No swelling.  Skin:    General: Skin is warm and dry.  Neurological:     General: No focal deficit present.     Mental Status: She is alert.     Comments: Not Oriented. Would follow some commands for Nurses  Psychiatric:        Mood and Affect: Mood normal. Mood is not anxious.        Speech: Speech is delayed.        Behavior: Behavior is withdrawn.     Labs  reviewed: Recent Labs    07/14/17  NA 142  K 3.6  CL 112  CO2 22  BUN 11  CREATININE 1.0  CALCIUM 8.9   Recent Labs    07/14/17  AST 18  ALT 20  ALKPHOS 87  PROT 6.0  ALBUMIN 4.0   Recent Labs    07/14/17 02/16/18  WBC 7.7 5.6  HGB 14.7 13.5  HCT 44 40  PLT 260 220   Lab Results  Component Value Date   TSH 1.32 02/16/2018   Lab Results  Component Value Date   HGBA1C 5.5 12/02/2016   Lab Results  Component Value Date   CHOL 174 10/21/2016   HDL 45 10/21/2016   LDLCALC 103 10/21/2016   TRIG 158 10/21/2016    Significant Diagnostic Results in last 30 days:  No results found.  Assessment/Plan Dementia with Behavior Issues At this Time patient is somewhat stable on Risperdal low dose She is also on Zoloft and Remeron for her Mood Would not make any changes right now Reval in 3 month. Depression Not Candidate for any GDR  Essential hypertension On Low dose of Metoprolol  Chronic embolism and thrombosis  On Chronic Xarelto  GERD Continue on omeprazole  Her Labs reviewed and they were Stable  Family/ staff Communication:   Labs/tests ordered:   Total time spent in this patient care encounter was 25_ minutes; greater than 50% of the visit spent counseling patient, reviewing records , Labs and coordinating care for problems addressed at this encounter.

## 2018-03-12 ENCOUNTER — Encounter: Payer: Self-pay | Admitting: Nurse Practitioner

## 2018-03-12 ENCOUNTER — Non-Acute Institutional Stay (SKILLED_NURSING_FACILITY): Payer: Medicare Other | Admitting: Nurse Practitioner

## 2018-03-12 DIAGNOSIS — F331 Major depressive disorder, recurrent, moderate: Secondary | ICD-10-CM

## 2018-03-12 DIAGNOSIS — F01518 Vascular dementia, unspecified severity, with other behavioral disturbance: Secondary | ICD-10-CM

## 2018-03-12 DIAGNOSIS — F0151 Vascular dementia with behavioral disturbance: Secondary | ICD-10-CM

## 2018-03-12 DIAGNOSIS — R443 Hallucinations, unspecified: Secondary | ICD-10-CM | POA: Diagnosis not present

## 2018-03-12 NOTE — Progress Notes (Signed)
Location:  [place] Room Number: 109 Place of Service:  SNF (31) Provider:  Marlana Latus  NP  Kijuan Gallicchio X, NP  Patient Care Team: Yazlyn Wentzel X, NP as PCP - General (Internal Medicine) Pricila Bridge X, NP as Nurse Practitioner (Internal Medicine) Dunn, Warnell Bureau as Consulting Physician (Optometry) Monna Fam, MD as Consulting Physician (Ophthalmology) Lucky Cowboy, Gordy Clement, NP as Nurse Practitioner (Hospice and Palliative Medicine) Virgie Dad, MD as Consulting Physician (Internal Medicine)  Extended Emergency Contact Information Primary Emergency Contact: [facility] Address: [address], [place] [address] [name] of [place] Phone: [phone] Work Phone: [phone] Mobile Phone: [phone] Relation: Daughter  Code Status:  DNR Goals of care: Advanced Directive information Advanced Directives 03/12/2018  Does Patient Have a Medical Advance Directive? Yes  Type of Paramedic of [place];Out of facility DNR (pink MOST or yellow form)  Does patient want to make changes to medical advance directive? No - Patient declined  Copy of [place] in Chart? Yes - validated most recent copy scanned in chart (See row information)  Pre-existing out of facility DNR order (yellow form or pink MOST form) Yellow form placed in chart (order not valid for inpatient use)     Chief Complaint  Patient presents with  . Acute Visit    C/o- anxiety    HPI:  Pt is a 85 y.o. female seen today for an acute visit for Psychiatric consult, [employer] APRN, MSN CS: increase Risperdal to 0.25mg  tid for resistant to ADLs, agitation, refuses  medications sometimes, flat affect. Reviewed the last 14 day nursing behavioral documentation: anxious x1-easily altered, x1 no specific behaviors-easily altered, x1 no specific behaviors-difficult to alter-all the above should be documented for  auditory hallucination. Hx of depression, auditory hallucination, dementia, she resides in memory care unit, on Sertraline 150mg  qd, Risperdal 0.25mg  bid, Mirtazapine 7.5mg  qd. She sleeps and eats well.    Past Medical History:  Diagnosis Date  . Acute encephalopathy   . Aphasia following unspecified cerebrovascular disease   . 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 Depression   . Dysphagia following cerebrovascular disease   . Hepatic steatosis   . Hydroureteronephrosis   . Hypertension   . Insomnia   . Macular degeneration   . Major depressive disorder, recurrent ([place])   . Neuralgia and neuritis, unspecified ([place])   . Nontraumatic intracranial hemorrhage ([place])   . Stroke ([place])   . 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   . 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 ABDOMINAL HYSTERECTOMY    . APPENDECTOMY    . BREAST SURGERY    . MASTECTOMY Right     Allergies  Allergen Reactions  . Hydrocodone Nausea And Vomiting  . Morphine And Related Nausea And Vomiting  . Actonel [Risedronate Sodium] Other (See Comments)    unknown  . Darvon [Propoxyphene Hcl] Other (See Comments)    unknown  . Oxycodone Hcl Other (See Comments)    unknown  . Pneumovax [Pneumococcal Polysaccharide Vaccine] Other (See Comments)    unknown    Outpatient Encounter Medications as of 03/12/2018  Medication Sig  . acetaminophen (TYLENOL) 325 MG tablet Take 650 mg by mouth every 4 (four) hours as needed.  . bisacodyl (DULCOLAX) 10 MG  suppository Place 10 mg rectally daily as needed for mild constipation or moderate constipation.  . hydrocortisone 2.5 % lotion Apply topically 2 (two) times daily as needed. Apply to itchy  area on face.  . loratadine (CLARITIN) 10 MG tablet Take 10 mg by mouth daily.  . metoprolol tartrate (LOPRESSOR) 25 MG tablet Take 12.5 mg by mouth 2 (two) times  daily.   . mirtazapine (REMERON) 15 MG tablet Take 7.5 mg by mouth at bedtime.   Marland Kitchen nystatin (NYSTATIN) powder Apply topically daily as needed. Under left breast   . omeprazole (PRILOSEC) 10 MG capsule Take 10 mg by mouth daily.  . polyethylene glycol (MIRALAX / GLYCOLAX) packet Take 17 g by mouth daily.  . risperiDONE (RISPERDAL) 0.25 MG tablet Take 0.25 mg by mouth 2 (two) times daily.  . Rivaroxaban (XARELTO) 15 MG TABS tablet Take 1 tablet (15 mg total) by mouth daily at 6 PM.  . sertraline (ZOLOFT) 100 MG tablet Take 100 mg by mouth at bedtime. Take with 50 mg tablet  . sertraline (ZOLOFT) 50 MG tablet Take 50 mg by mouth at bedtime. Take with 100 mg = 150 mg   . Simethicone (GAS RELIEF 80 PO) Take 80 mg by mouth 3 (three) times daily with meals.   . [DISCONTINUED] sertraline (ZOLOFT) 100 MG tablet Take 1 tablet (100 mg total) by mouth daily at 6 PM. (Patient taking differently: Take 100 mg by mouth at bedtime. Take 50 mg + 100 mg =150 mg)   No facility-administered encounter medications on file as of 03/12/2018.    ROS was provided with assistance of staff Review of Systems  Constitutional: Negative for activity change, appetite change, chills, diaphoresis, fatigue and fever.  HENT: Positive for hearing loss. Negative for congestion and voice change.   Respiratory: Negative for cough, shortness of breath and wheezing.   Cardiovascular: Negative for chest pain, palpitations and leg swelling.  Gastrointestinal: Negative for abdominal distention, abdominal pain, constipation, diarrhea, nausea and vomiting.  Genitourinary: Negative for difficulty urinating, dysuria and urgency.  Musculoskeletal: Positive for arthralgias and gait problem.  Neurological: Positive for speech difficulty. Negative for dizziness, facial asymmetry, weakness and headaches.       Dementia, expressive aphasia.   Psychiatric/Behavioral: Positive for behavioral problems and confusion. Negative for agitation,  hallucinations and sleep disturbance. The patient is nervous/anxious.     Immunization History  Administered Date(s) Administered  . Influenza Whole 10/19/2017  . Influenza-Unspecified 11/20/2013, 09/26/2014, 10/24/2015, 11/03/2016  . Tdap 09/19/2016   Pertinent  Health Maintenance Due  Topic Date Due  . INFLUENZA VACCINE  Completed  . DEXA SCAN  Discontinued   Fall Risk  09/15/2017 09/05/2016  Falls in the past year? No No   Functional Status Survey:    Vitals:   03/12/18 1633  BP: 136/78  Pulse: 74  Resp: 20  Temp: 98.2 F (36.8 C)  SpO2: 96%  Weight: 152 lb 6.4 oz (69.1 kg)  Height: 5\' 3"  (1.6 m)   Body mass index is 27 kg/m. Physical Exam Constitutional:      General: She is not in acute distress.    Appearance: Normal appearance. She is not ill-appearing, toxic-appearing or diaphoretic.  HENT:     Head: Normocephalic and atraumatic.     Nose: Nose normal.     Mouth/Throat:     Mouth: Mucous membranes are moist.  Eyes:     Extraocular Movements: Extraocular movements intact.     Pupils: Pupils are equal, round, and reactive  to light.  Neck:     Musculoskeletal: Normal range of motion and neck supple.  Cardiovascular:     Rate and Rhythm: Normal rate and regular rhythm.     Heart sounds: No murmur.  Pulmonary:     Effort: Pulmonary effort is normal.     Breath sounds: No wheezing, rhonchi or rales.  Abdominal:     General: There is no distension.     Palpations: Abdomen is soft.     Tenderness: There is no abdominal tenderness. There is no guarding.     Hernia: No hernia is present.  Musculoskeletal:     Comments: Ambulates with walker.   Neurological:     General: No focal deficit present.     Mental Status: She is alert. Mental status is at baseline.     Cranial Nerves: No cranial nerve deficit.     Sensory: No sensory deficit.     Motor: No weakness.     Coordination: Coordination normal.     Gait: Gait abnormal.     Comments: Oriented to self,  her room on unit.   Psychiatric:        Mood and Affect: Mood normal.        Behavior: Behavior normal.     Comments: Delayed responses, smiled upon my visit.      Labs reviewed: Recent Labs    07/14/17  NA 142  K 3.6  CL 112  CO2 22  BUN 11  CREATININE 1.0  CALCIUM 8.9   Recent Labs    07/14/17  AST 18  ALT 20  ALKPHOS 87  PROT 6.0  ALBUMIN 4.0   Recent Labs    07/14/17 02/16/18  WBC 7.7 5.6  5.6  HGB 14.7 12.5  13.5  HCT 44 40  40  PLT 260 220  220   Lab Results  Component Value Date   TSH 1.32 02/16/2018   Lab Results  Component Value Date   HGBA1C 5.5 12/02/2016   Lab Results  Component Value Date   CHOL 174 10/21/2016   HDL 45 10/21/2016   LDLCALC 103 10/21/2016   TRIG 158 10/21/2016    Significant Diagnostic Results in last 30 days:  No results found.  Assessment/Plan Hallucination Will continue to observe, document targeted behaviors, will continue Risperdal 0.25mg  bid po for now.   Major depressive disorder, recurrent ([place]) Noted anxious x1 easily altered 09/04/54, not certain the nature of behaviors reported 03/07/18 03/11/18,  sleeps and eats well at her baseline, continue Sertraline 150mg  qd, Mirtazapine 7.5mg  qhs. Continue to observe and document behaviors, will treat accordingly.   Vascular dementia with behavior disturbance Will continue to observe, document as psych consult stated in the note: agitation, refuses medications etc behaviors. Continue memory care unit for safety and care assistance.      Family/ staff Communication: plan of care reviewed with the patient and charge nurse.   Labs/tests ordered:  none  Time spend 25 minutes.

## 2018-03-15 ENCOUNTER — Encounter: Payer: Self-pay | Admitting: Nurse Practitioner

## 2018-03-15 NOTE — Assessment & Plan Note (Signed)
Will continue to observe, document as psych consult stated in the note: agitation, refuses medications etc behaviors. Continue memory care unit for safety and care assistance.

## 2018-03-15 NOTE — Assessment & Plan Note (Addendum)
Noted anxious x1 easily altered 5/32/99, not certain the nature of behaviors reported 03/07/18 03/11/18,  sleeps and eats well at her baseline, continue Sertraline 150mg  qd, Mirtazapine 7.5mg  qhs. Continue to observe and document behaviors, will treat accordingly.

## 2018-03-15 NOTE — Assessment & Plan Note (Addendum)
Will continue to observe, document targeted behaviors, will continue Risperdal 0.25mg  bid po for now.

## 2018-03-22 ENCOUNTER — Non-Acute Institutional Stay (SKILLED_NURSING_FACILITY): Payer: Medicare Other | Admitting: Nurse Practitioner

## 2018-03-22 ENCOUNTER — Encounter: Payer: Self-pay | Admitting: Nurse Practitioner

## 2018-03-22 DIAGNOSIS — K219 Gastro-esophageal reflux disease without esophagitis: Secondary | ICD-10-CM

## 2018-03-22 DIAGNOSIS — F329 Major depressive disorder, single episode, unspecified: Secondary | ICD-10-CM

## 2018-03-22 DIAGNOSIS — K5909 Other constipation: Secondary | ICD-10-CM | POA: Diagnosis not present

## 2018-03-22 DIAGNOSIS — F0393 Unspecified dementia, unspecified severity, with mood disturbance: Secondary | ICD-10-CM

## 2018-03-22 DIAGNOSIS — D6859 Other primary thrombophilia: Secondary | ICD-10-CM

## 2018-03-22 DIAGNOSIS — F0280 Dementia in other diseases classified elsewhere without behavioral disturbance: Secondary | ICD-10-CM

## 2018-03-22 DIAGNOSIS — I10 Essential (primary) hypertension: Secondary | ICD-10-CM | POA: Diagnosis not present

## 2018-03-22 NOTE — Assessment & Plan Note (Signed)
Stable, continue MiraLax qd, prn Bisacodyl 10mg  suppository.

## 2018-03-22 NOTE — Assessment & Plan Note (Signed)
mood is stable, continue Sertraline 150mg  qd, Risperdal 0.25mg  bid, Mirtazapine 7.5mg  qd.

## 2018-03-22 NOTE — Assessment & Plan Note (Signed)
Blood pressure is controlled, continue Metoprolol 12.5mg bid.  

## 2018-03-22 NOTE — Progress Notes (Signed)
Location:  [place] Room Number: 109 Place of Service:  SNF (31) Provider:  Marlana Latus  NP  Andrena Margerum X, NP  Patient Care Team: Iyahna Obriant X, NP as PCP - General (Internal Medicine) Winnona Wargo X, NP as Nurse Practitioner (Internal Medicine) Dunn, Warnell Bureau as Consulting Physician (Optometry) Monna Fam, MD as Consulting Physician (Ophthalmology) Lucky Cowboy, Gordy Clement, NP as Nurse Practitioner (Hospice and Palliative Medicine) Virgie Dad, MD as Consulting Physician (Internal Medicine)  Extended Emergency Contact Information Primary Emergency Contact: [facility] Address: [address], [place] [address] [name] of [place] Phone: [phone] Work Phone: [phone] Mobile Phone: [phone] Relation: Daughter  Code Status:  DNR Goals of care: Advanced Directive information Advanced Directives 03/12/2018  Does Patient Have a Medical Advance Directive? Yes  Type of Paramedic of [place];Out of facility DNR (pink MOST or yellow form)  Does patient want to make changes to medical advance directive? No - Patient declined  Copy of [place] in Chart? Yes - validated most recent copy scanned in chart (See row information)  Pre-existing out of facility DNR order (yellow form or pink MOST form) Yellow form placed in chart (order not valid for inpatient use)     Chief Complaint  Patient presents with  . Medical Management of Chronic Issues    HPI:  Pt is a 85 y.o. female seen today for medical management of chronic diseases.    The patient resides in Memory care unit [facility] for safety and care assistance, her mood is stable on Sertraline 150mg  qd, Risperdal 0.25mg  bid, Mirtazapine 7.5mg  qd. Protein C/S deficiency/DVT, chronic anticoagulation on Rivaroxaban 15mg  qd. Constipation,  stable on MiraLax qd, prn Bisacodyl 10mg  suppository. GERD stable on Omeprazole 10mg  qd.  HTN, blood pressure is controlled on Metoprolol 12.5mg  bid  Past Medical History:  Diagnosis Date  . Acute encephalopathy   . Aphasia following unspecified cerebrovascular disease   . 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 Depression   . Dysphagia following cerebrovascular disease   . Hepatic steatosis   . Hydroureteronephrosis   . Hypertension   . Insomnia   . Macular degeneration   . Major depressive disorder, recurrent ([place])   . Neuralgia and neuritis, unspecified ([place])   . Nontraumatic intracranial hemorrhage ([place])   . Stroke ([place])   . 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   . 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 ABDOMINAL HYSTERECTOMY    . APPENDECTOMY    . BREAST SURGERY    . MASTECTOMY Right     Allergies  Allergen Reactions  . Hydrocodone Nausea And Vomiting  . Morphine And Related Nausea And Vomiting  . Actonel [Risedronate Sodium] Other (See Comments)    unknown  . Darvon [Propoxyphene Hcl] Other (See Comments)    unknown  . Oxycodone Hcl Other (See Comments)    unknown  . Pneumovax [Pneumococcal Polysaccharide Vaccine] Other (See Comments)    unknown    Outpatient Encounter Medications as of 03/22/2018  Medication Sig  . acetaminophen (TYLENOL) 325 MG tablet Take 650 mg by mouth every 4 (four) hours as needed.  . bisacodyl (DULCOLAX) 10 MG suppository Place 10 mg rectally daily as needed for mild constipation or moderate constipation.  . hydrocortisone 2.5 %  lotion Apply topically 2 (two) times daily as needed. Apply to itchy  area on face.  . loratadine (CLARITIN) 10 MG tablet Take 10 mg by mouth daily.  Marland Kitchen LORazepam (ATIVAN) 2 MG tablet Take 2 mg by mouth once. Take 1 tablet prior to dental procedure as directed by [facility]  . metoprolol tartrate (LOPRESSOR) 25 MG tablet Take 12.5 mg by mouth 2 (two) times daily.   .  mirtazapine (REMERON) 15 MG tablet Take 7.5 mg by mouth at bedtime.   Marland Kitchen nystatin (NYSTATIN) powder Apply topically daily as needed. Under left breast   . omeprazole (PRILOSEC) 10 MG capsule Take 10 mg by mouth daily.  . polyethylene glycol (MIRALAX / GLYCOLAX) packet Take 17 g by mouth daily.  . risperiDONE (RISPERDAL) 0.25 MG tablet Take 0.25 mg by mouth 2 (two) times daily.  . Rivaroxaban (XARELTO) 15 MG TABS tablet Take 1 tablet (15 mg total) by mouth daily at 6 PM.  . sertraline (ZOLOFT) 100 MG tablet Take 100 mg by mouth at bedtime. Take with 50 mg tablet  . sertraline (ZOLOFT) 50 MG tablet Take 50 mg by mouth at bedtime. Take with 100 mg = 150 mg   . Simethicone (GAS RELIEF 80 PO) Take 80 mg by mouth 3 (three) times daily with meals.    No facility-administered encounter medications on file as of 03/22/2018.    ROS was provided with assistance of staff.  Review of Systems  Constitutional: Negative for activity change, appetite change, chills, diaphoresis, fatigue, fever and unexpected weight change.  HENT: Positive for hearing loss. Negative for congestion and voice change.   Respiratory: Negative for cough, shortness of breath and wheezing.   Cardiovascular: Negative for chest pain, palpitations and leg swelling.  Gastrointestinal: Negative for abdominal distention, abdominal pain, constipation, diarrhea, nausea and vomiting.  Genitourinary: Negative for difficulty urinating, dysuria and urgency.  Musculoskeletal: Positive for gait problem.  Skin: Negative for color change.  Neurological: Negative for dizziness, speech difficulty, weakness and headaches.       Dementia  Psychiatric/Behavioral: Negative for agitation, behavioral problems, hallucinations and sleep disturbance. The patient is not nervous/anxious.     Immunization History  Administered Date(s) Administered  . Influenza Whole 10/19/2017  . Influenza-Unspecified 11/20/2013, 09/26/2014, 10/24/2015, 11/03/2016  . Tdap  09/19/2016   Pertinent  Health Maintenance Due  Topic Date Due  . INFLUENZA VACCINE  Completed  . DEXA SCAN  Discontinued   Fall Risk  09/15/2017 09/05/2016  Falls in the past year? No No   Functional Status Survey:    Vitals:   03/22/18 0935  BP: 130/88  Pulse: 92  Resp: 16  Temp: 98.4 F (36.9 C)  SpO2: 96%  Weight: 152 lb 6.4 oz (69.1 kg)  Height: 5\' 3"  (1.6 m)   Body mass index is 27 kg/m. Physical Exam Constitutional:      General: She is not in acute distress.    Appearance: Normal appearance. She is not ill-appearing, toxic-appearing or diaphoretic.     Comments: Over weight   HENT:     Head: Normocephalic and atraumatic.     Nose: Nose normal.     Mouth/Throat:     Mouth: Mucous membranes are moist.  Eyes:     Extraocular Movements: Extraocular movements intact.     Pupils: Pupils are equal, round, and reactive to light.  Neck:     Musculoskeletal: Normal range of motion and neck supple.  Cardiovascular:     Rate and Rhythm: Normal  rate and regular rhythm.     Heart sounds: No murmur.  Pulmonary:     Effort: Pulmonary effort is normal.     Breath sounds: No wheezing, rhonchi or rales.  Abdominal:     General: There is no distension.     Palpations: Abdomen is soft.     Tenderness: There is no abdominal tenderness. There is no guarding or rebound.  Musculoskeletal:     Right lower leg: No edema.     Left lower leg: No edema.  Skin:    General: Skin is warm and dry.  Neurological:     General: No focal deficit present.     Mental Status: She is alert. Mental status is at baseline.     Cranial Nerves: No cranial nerve deficit.     Motor: No weakness.     Coordination: Coordination normal.     Gait: Gait abnormal.     Comments: Oriented to self and her room on unit. Slow responses.  Ambulates with walker.   Psychiatric:        Mood and Affect: Mood normal.        Behavior: Behavior normal.     Labs reviewed: Recent Labs    07/14/17  NA 142   K 3.6  CL 112  CO2 22  BUN 11  CREATININE 1.0  CALCIUM 8.9   Recent Labs    07/14/17  AST 18  ALT 20  ALKPHOS 87  PROT 6.0  ALBUMIN 4.0   Recent Labs    07/14/17 02/16/18  WBC 7.7 5.6  5.6  5.6  HGB 14.7 13.5  12.5  13.5  HCT 44 40  40  40  PLT 260 220  220  220   Lab Results  Component Value Date   TSH 1.32 02/16/2018   Lab Results  Component Value Date   HGBA1C 5.5 12/02/2016   Lab Results  Component Value Date   CHOL 174 10/21/2016   HDL 45 10/21/2016   LDLCALC 103 10/21/2016   TRIG 158 10/21/2016    Significant Diagnostic Results in last 30 days:  No results found.  Assessment/Plan Essential hypertension, benign Blood pressure is controlled, continue Metoprolol 12.5mg  bid.   Chronic constipation Stable, continue MiraLax qd, prn Bisacodyl 10mg  suppository.   GERD (gastroesophageal reflux disease) Stable, continue Omeprazole 10mg  qd.   Depression due to dementia mood is stable, continue Sertraline 150mg  qd, Risperdal 0.25mg  bid, Mirtazapine 7.5mg  qd.   Protein C deficiency (HCC) Stable, continue chronic anticoagulation therapy, continue Rivaroxaban 15mg  qd.      Family/ staff Communication: plan of care reviewed with the patient and charge nurse.   Labs/tests ordered:  none  Time spend 25 minutes.

## 2018-03-22 NOTE — Assessment & Plan Note (Signed)
Stable, continue Omeprazole 10mg qd.  

## 2018-03-22 NOTE — Assessment & Plan Note (Signed)
Stable, continue chronic anticoagulation therapy, continue Rivaroxaban 15mg  qd.

## 2018-04-21 ENCOUNTER — Encounter: Payer: Self-pay | Admitting: Nurse Practitioner

## 2018-04-21 ENCOUNTER — Non-Acute Institutional Stay (SKILLED_NURSING_FACILITY): Payer: Medicare Other | Admitting: Nurse Practitioner

## 2018-04-21 DIAGNOSIS — F0393 Unspecified dementia, unspecified severity, with mood disturbance: Secondary | ICD-10-CM

## 2018-04-21 DIAGNOSIS — K5909 Other constipation: Secondary | ICD-10-CM

## 2018-04-21 DIAGNOSIS — K219 Gastro-esophageal reflux disease without esophagitis: Secondary | ICD-10-CM | POA: Diagnosis not present

## 2018-04-21 DIAGNOSIS — R627 Adult failure to thrive: Secondary | ICD-10-CM

## 2018-04-21 DIAGNOSIS — F0280 Dementia in other diseases classified elsewhere without behavioral disturbance: Secondary | ICD-10-CM

## 2018-04-21 DIAGNOSIS — F329 Major depressive disorder, single episode, unspecified: Secondary | ICD-10-CM

## 2018-04-21 DIAGNOSIS — F01518 Vascular dementia, unspecified severity, with other behavioral disturbance: Secondary | ICD-10-CM

## 2018-04-21 DIAGNOSIS — F0151 Vascular dementia with behavioral disturbance: Secondary | ICD-10-CM | POA: Diagnosis not present

## 2018-04-21 DIAGNOSIS — D6859 Other primary thrombophilia: Secondary | ICD-10-CM

## 2018-04-21 DIAGNOSIS — R443 Hallucinations, unspecified: Secondary | ICD-10-CM

## 2018-04-21 NOTE — Assessment & Plan Note (Signed)
Her mood is stable, continue Sertraline 150mg qd.  

## 2018-04-21 NOTE — Assessment & Plan Note (Signed)
Stable, continue Risperidone 0.25mg  bid.

## 2018-04-21 NOTE — Progress Notes (Signed)
Location:  [place] Room Number: 109 Place of Service:  SNF (31) Provider:  Marlana Latus  NP  Shelby Anderle X, NP  Patient Care Team: Leshawn Houseworth X, NP as PCP - General (Internal Medicine) Talicia Sui X, NP as Nurse Practitioner (Internal Medicine) Dunn, Warnell Bureau as Consulting Physician (Optometry) Monna Fam, MD as Consulting Physician (Ophthalmology) Lucky Cowboy, Gordy Clement, NP as Nurse Practitioner (Hospice and Palliative Medicine) Virgie Dad, MD as Consulting Physician (Internal Medicine)  Extended Emergency Contact Information Primary Emergency Contact: [facility] Address: [address], [place] [address] [name] of [place] Phone: [phone] Work Phone: [phone] Mobile Phone: [phone] Relation: Daughter  Code Status:  DNR Goals of care: Advanced Directive information Advanced Directives 04/21/2018  Does Patient Have a Medical Advance Directive? Yes  Type of Paramedic of [place];Out of facility DNR (pink MOST or yellow form)  Does patient want to make changes to medical advance directive? No - Patient declined  Copy of [place] in Chart? Yes - validated most recent copy scanned in chart (See row information)  Pre-existing out of facility DNR order (yellow form or pink MOST form) Yellow form placed in chart (order not valid for inpatient use)     Chief Complaint  Patient presents with   Medical Management of Chronic Issues    HPI:  Pt is a 85 y.o. female seen today for medical management of chronic diseases.    The patient resides in memory care unit, [facility] for safety and care assistance. Weight loss about #4Ibs in the past month, TSH 1.32 02/16/18, on Mirtazapine 7.39m qd, her BMI 26.38.  GERD stable on Omeprazole 19mqd. Her mood is stable on Sertraline 15084md. Protein C/S deficiency, on long term Xarelto 62m37m. No hallucination, on Risperidone  0.25mg70m. No constipation, on MiraLax qd. Blood pressure is controlled on Metoprolol 12.5mg b36m    Past Medical History:  Diagnosis Date   Acute encephalopathy    Aphasia following unspecified cerebrovascular disease    Chronic embolism and thrombosis of unspecified vein    Depression    Dysphagia following cerebrovascular disease    Hepatic steatosis    Hydroureteronephrosis    Hypertension    Insomnia    Macular degeneration    Major depressive disorder, recurrent (HCC)    Neuralgia and neuritis, unspecified ([place])    Nontraumatic intracranial hemorrhage (HCC)    Stroke (HCC)  Shrevenspecified type of carcinoma in situ of unspecified breast    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    right   Past Surgical History:  Procedure Laterality Date   ABDOMINAL HYSTERECTOMY     APPENDECTOMY     BREAST SURGERY     MASTECTOMY Right     Allergies  Allergen Reactions   Hydrocodone Nausea And Vomiting   Morphine And Related Nausea And Vomiting   Actonel [Risedronate Sodium] Other (See Comments)    unknown   Darvon [Propoxyphene Hcl] Other (See Comments)    unknown   Oxycodone Hcl Other (See Comments)    unknown   Pneumovax [Pneumococcal Polysaccharide Vaccine] Other (See Comments)    unknown    Outpatient Encounter Medications as of 04/21/2018  Medication Sig   acetaminophen (TYLENOL) 325 MG tablet Take 650 mg by mouth every 4 (four) hours as needed.   bisacodyl (DULCOLAX) 10 MG suppository Place 10 mg  rectally daily as needed for mild constipation or moderate constipation.   hydrocortisone 2.5 % lotion Apply topically 2 (two) times daily as needed. Apply to itchy  area on face.   loratadine (CLARITIN) 10 MG tablet Take 10 mg by mouth daily.   metoprolol tartrate (LOPRESSOR) 25 MG tablet Take 12.5 mg by mouth 2 (two) times daily.    mirtazapine (REMERON) 15 MG tablet Take 7.5 mg by mouth at bedtime.    NON FORMULARY MAGIC  CUP WITH LUNCH AND DINNER   nystatin (NYSTATIN) powder Apply topically daily as needed. Under left breast    omeprazole (PRILOSEC) 10 MG capsule Take 10 mg by mouth daily.   polyethylene glycol (MIRALAX / GLYCOLAX) packet Take 17 g by mouth daily.   risperiDONE (RISPERDAL) 0.25 MG tablet Take 0.25 mg by mouth 2 (two) times daily.   Rivaroxaban (XARELTO) 15 MG TABS tablet Take 1 tablet (15 mg total) by mouth daily at 6 PM.   sertraline (ZOLOFT) 100 MG tablet Take 100 mg by mouth at bedtime. Take with 50 mg tablet   sertraline (ZOLOFT) 50 MG tablet Take 50 mg by mouth at bedtime. Take with 100 mg = 150 mg    Simethicone (GAS RELIEF 80 PO) Take 80 mg by mouth 3 (three) times daily with meals.    [DISCONTINUED] LORazepam (ATIVAN) 2 MG tablet Take 2 mg by mouth once. Take 1 tablet prior to dental procedure as directed by [facility]   No facility-administered encounter medications on file as of 04/21/2018.    ROS was provided with assistance of staff Review of Systems  Constitutional: Positive for unexpected weight change. Negative for activity change, appetite change, chills, diaphoresis, fatigue and fever.       #4Ibs weight loss in the past month.   HENT: Positive for hearing loss. Negative for congestion and voice change.   Eyes: Negative for visual disturbance.  Respiratory: Negative for cough, shortness of breath and wheezing.   Cardiovascular: Negative for chest pain, palpitations and leg swelling.  Gastrointestinal: Negative for abdominal distention, abdominal pain, constipation, diarrhea, nausea and vomiting.  Genitourinary: Negative for difficulty urinating, dysuria and urgency.  Musculoskeletal: Positive for gait problem.  Skin: Negative for color change and pallor.  Neurological: Negative for dizziness, speech difficulty, weakness and headaches.       Dementia  Psychiatric/Behavioral: Negative for agitation, hallucinations and sleep disturbance. The patient is not  nervous/anxious.     Immunization History  Administered Date(s) Administered   Influenza Whole 10/19/2017   Influenza-Unspecified 11/20/2013, 09/26/2014, 10/24/2015, 11/03/2016   Tdap 09/19/2016   Pertinent  Health Maintenance Due  Topic Date Due   INFLUENZA VACCINE  08/07/2018   DEXA SCAN  Discontinued   Fall Risk  09/15/2017 09/05/2016  Falls in the past year? No No   Functional Status Survey:    Vitals:   04/21/18 0923  BP: 130/78  Pulse: 72  Resp: 16  Temp: (!) 97 F (36.1 C)  SpO2: 96%  Weight: 148 lb 14.4 oz (67.5 kg)  Height: '5\' 3"'  (1.6 m)   Body mass index is 26.38 kg/m. Physical Exam Vitals signs and nursing note reviewed.  Constitutional:      General: She is not in acute distress.    Appearance: Normal appearance. She is not ill-appearing, toxic-appearing or diaphoretic.     Comments: Over weight  HENT:     Head: Normocephalic and atraumatic.     Nose: Nose normal.     Mouth/Throat:  Mouth: Mucous membranes are moist.  Eyes:     Extraocular Movements: Extraocular movements intact.     Pupils: Pupils are equal, round, and reactive to light.  Neck:     Musculoskeletal: Normal range of motion and neck supple.  Cardiovascular:     Rate and Rhythm: Normal rate and regular rhythm.     Heart sounds: No murmur.  Pulmonary:     Effort: Pulmonary effort is normal.     Breath sounds: No wheezing, rhonchi or rales.  Abdominal:     Palpations: Abdomen is soft.     Tenderness: There is no abdominal tenderness. There is no guarding or rebound.  Musculoskeletal:     Right lower leg: No edema.     Left lower leg: No edema.     Comments: W/c for mobility.   Skin:    General: Skin is warm and dry.  Neurological:     General: No focal deficit present.     Mental Status: She is alert. Mental status is at baseline.     Cranial Nerves: No cranial nerve deficit.     Motor: No weakness.     Coordination: Coordination normal.     Gait: Gait abnormal.      Comments: Oriented to self.   Psychiatric:        Mood and Affect: Mood normal.        Behavior: Behavior normal.     Labs reviewed: Recent Labs    07/14/17  NA 142  K 3.6  CL 112  CO2 22  BUN 11  CREATININE 1.0  CALCIUM 8.9   Recent Labs    07/14/17  AST 18  ALT 20  ALKPHOS 87  PROT 6.0  ALBUMIN 4.0   Recent Labs    07/14/17 02/16/18  WBC 7.7 5.6   5.6   5.6  HGB 14.7 13.5   12.5   13.5  HCT 44 40   40   40  PLT 260 220   220   220   Lab Results  Component Value Date   TSH 1.32 02/16/2018   Lab Results  Component Value Date   HGBA1C 5.5 12/02/2016   Lab Results  Component Value Date   CHOL 174 10/21/2016   HDL 45 10/21/2016   LDLCALC 103 10/21/2016   TRIG 158 10/21/2016    Significant Diagnostic Results in last 30 days:  No results found.  Assessment/Plan Chronic constipation Stable, continue MiraLax daily.   GERD (gastroesophageal reflux disease) Stable, continue Omeprazole 70m qd.   Depression due to dementia Her mood is stable, continue Sertraline 1527mqd.   Vascular dementia with behavior disturbance [facility]  Protein S deficiency Continue Xarelto 1541md.   Hallucination Stable, continue Risperidone 0.7m35md.   Adult failure to thrive Weight loss about #4Ibs in the past month, TSH 1.32 02/16/18, on Mirtazapine 7.5mg 19m her BMI 26.38. f/u dietitian. Update CBC/diff, CMP/eGFR     Family/ staff Communication: plan of care reviewed with the patient and charge nurse.   Labs/tests ordered:  CBC/diff, CMP/eGFR  Time spend 25 minutes.

## 2018-04-21 NOTE — Assessment & Plan Note (Signed)
Weight loss about #4Ibs in the past month, TSH 1.32 02/16/18, on Mirtazapine 7.35m qd, her BMI 26.38. f/u dietitian. Update CBC/diff, CMP/eGFR

## 2018-04-21 NOTE — Assessment & Plan Note (Signed)
Continue memory care unit [facility] for safety and care assistance.  

## 2018-04-21 NOTE — Assessment & Plan Note (Signed)
Continue Xarelto 15mg  qd.

## 2018-04-21 NOTE — Assessment & Plan Note (Signed)
Stable, continue Omeprazole 10mg qd.  

## 2018-04-21 NOTE — Assessment & Plan Note (Signed)
Stable, continue MiraLax daily.  

## 2018-04-22 LAB — HEPATIC FUNCTION PANEL
ALT: 16 (ref 7–35)
AST: 14 (ref 13–35)
Alkaline Phosphatase: 84 (ref 25–125)
Bilirubin, Total: 0.3

## 2018-04-22 LAB — BASIC METABOLIC PANEL
BUN: 12 (ref 4–21)
Creatinine: 0.9 (ref 0.5–1.1)
Glucose: 97
Potassium: 4.2 (ref 3.4–5.3)
Sodium: 143 (ref 137–147)

## 2018-04-22 LAB — CBC AND DIFFERENTIAL
HCT: 40 (ref 36–46)
Hemoglobin: 13.9 (ref 12.0–16.0)
Platelets: 217 (ref 150–399)
WBC: 7.2

## 2018-07-02 ENCOUNTER — Encounter: Payer: Self-pay | Admitting: Internal Medicine

## 2018-07-02 ENCOUNTER — Non-Acute Institutional Stay (SKILLED_NURSING_FACILITY): Payer: Medicare Other | Admitting: Internal Medicine

## 2018-07-02 DIAGNOSIS — F0151 Vascular dementia with behavioral disturbance: Secondary | ICD-10-CM

## 2018-07-02 DIAGNOSIS — I10 Essential (primary) hypertension: Secondary | ICD-10-CM | POA: Diagnosis not present

## 2018-07-02 DIAGNOSIS — F01518 Vascular dementia, unspecified severity, with other behavioral disturbance: Secondary | ICD-10-CM

## 2018-07-02 DIAGNOSIS — F329 Major depressive disorder, single episode, unspecified: Secondary | ICD-10-CM

## 2018-07-02 DIAGNOSIS — F0280 Dementia in other diseases classified elsewhere without behavioral disturbance: Secondary | ICD-10-CM

## 2018-07-02 DIAGNOSIS — K219 Gastro-esophageal reflux disease without esophagitis: Secondary | ICD-10-CM

## 2018-07-02 DIAGNOSIS — I8291 Chronic embolism and thrombosis of unspecified vein: Secondary | ICD-10-CM

## 2018-07-02 DIAGNOSIS — F0393 Unspecified dementia, unspecified severity, with mood disturbance: Secondary | ICD-10-CM

## 2018-07-02 NOTE — Progress Notes (Signed)
Location:  [facility] Room Number: 109A Place of Service:  SNF (31) Provider:  Dr. Veleta Miners   Mast, Man X, NP  Patient Care Team: Mast, Man X, NP as PCP - General (Internal Medicine) Mast, Man X, NP as Nurse Practitioner (Internal Medicine) Idolina Primer, Warnell Bureau as Consulting Physician (Optometry) Monna Fam, MD as Consulting Physician (Ophthalmology) Lucky Cowboy, Gordy Clement, NP as Nurse Practitioner (Hospice and Palliative Medicine) Virgie Dad, MD as Consulting Physician (Internal Medicine)  Extended Emergency Contact Information Primary Emergency Contact: [facility] Address: [address], [place] [address] [name] of [place] Phone: [phone] Work Phone: [phone] Mobile Phone: [phone] Relation: Daughter  Code Status:  DNR Goals of care: Advanced Directive information Advanced Directives 07/02/2018  Does Patient Have a Medical Advance Directive? Yes  Type of Advance Directive Out of facility DNR (pink MOST or yellow form);Living will;Healthcare Power of Attorney  Does patient want to make changes to medical advance directive? No - Patient declined  Copy of [place] in Chart? Yes - validated most recent copy scanned in chart (See row information)  Pre-existing out of facility DNR order (yellow form or pink MOST form) Yellow form placed in chart (order not valid for inpatient use)     Chief Complaint  Patient presents with  . Medical Management of Chronic Issues    Routine Visit    HPI:  Pt is a 85 y.o. female seen today for medical management of chronic diseases.   Patient has a history of dementia with behavior issues, depression,history of chronic thrombosis on Xarelto, gastritis. Patient has history of dementia and is unable to give any history Per nurses she has been stable.  Does have history of behavior issues.  She would get paranoid and resists Care. Weight is  stable Patient was calm and had no acute issues.  She was alert and was replying appropriately   Past Medical History:  Diagnosis Date  . Acute encephalopathy   . Aphasia following unspecified cerebrovascular disease   . 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 Depression   . Dysphagia following cerebrovascular disease   . Hepatic steatosis   . Hydroureteronephrosis   . Hypertension   . Insomnia   . Macular degeneration   . Major depressive disorder, recurrent ([place])   . Neuralgia and neuritis, unspecified (CODE)   . Nontraumatic intracranial hemorrhage ([place])   . Stroke ([place])   . 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   . 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 ABDOMINAL HYSTERECTOMY    . APPENDECTOMY    . BREAST SURGERY    . MASTECTOMY Right     Allergies  Allergen Reactions  . Hydrocodone Nausea And Vomiting  . Morphine And Related Nausea And Vomiting  . Actonel [Risedronate Sodium] Other (See Comments)    unknown  . Darvon [Propoxyphene Hcl] Other (See Comments)    unknown  . Oxycodone Hcl Other (See Comments)    unknown  . Pneumovax [Pneumococcal Polysaccharide Vaccine] Other (See Comments)    unknown    Outpatient Encounter Medications as of 07/02/2018  Medication Sig  . acetaminophen (TYLENOL) 325 MG tablet Take 650 mg by mouth every 4 (four) hours as needed.  . bisacodyl (DULCOLAX) 10 MG suppository Place 10 mg rectally daily  as needed for mild constipation or moderate constipation.  . hydrocortisone 2.5 % lotion Apply topically 2 (two) times daily as needed. Apply to itchy  area on face.  . loratadine (CLARITIN) 10 MG tablet Take 10 mg by mouth daily.  . metoprolol tartrate (LOPRESSOR) 25 MG tablet Take 12.5 mg by mouth 2 (two) times daily.   . mirtazapine (REMERON) 15 MG tablet Take 7.5 mg by mouth at bedtime.   . NON FORMULARY MAGIC CUP  WITH LUNCH AND DINNER  . nystatin (NYSTATIN) powder Apply topically daily as needed. Under left breast   . omeprazole (PRILOSEC) 10 MG capsule Take 10 mg by mouth daily.  . polyethylene glycol (MIRALAX / GLYCOLAX) packet Take 17 g by mouth daily.  . risperiDONE (RISPERDAL) 0.25 MG tablet Take 0.25 mg by mouth 2 (two) times daily.  . Rivaroxaban (XARELTO) 15 MG TABS tablet Take 1 tablet (15 mg total) by mouth daily at 6 PM.  . sertraline (ZOLOFT) 100 MG tablet Take 100 mg by mouth at bedtime. Take with 50 mg tablet  . sertraline (ZOLOFT) 50 MG tablet Take 50 mg by mouth at bedtime. Take with 100 mg = 150 mg   . Simethicone (GAS RELIEF 80 PO) Take 80 mg by mouth 3 (three) times daily with meals.    No facility-administered encounter medications on file as of 07/02/2018.     Review of Systems  Review of Systems  Constitutional: Negative for activity change, appetite change, chills, diaphoresis, fatigue and fever.  HENT: Negative for mouth sores, postnasal drip, rhinorrhea, sinus pain and sore throat.   Respiratory: Negative for apnea, cough, chest tightness, shortness of breath and wheezing.   Cardiovascular: Negative for chest pain, palpitations and leg swelling.  Gastrointestinal: Negative for abdominal distention, abdominal pain, constipation, diarrhea, nausea and vomiting.  Genitourinary: Negative for dysuria and frequency.  Musculoskeletal: Negative for arthralgias, joint swelling and myalgias.  Skin: Negative for rash.  Neurological: Negative for dizziness, syncope, weakness, light-headedness and numbness.  Psychiatric/Behavioral: Negative for behavioral problems, confusion and sleep disturbance.     Immunization History  Administered Date(s) Administered  . Influenza Whole 10/19/2017  . Influenza-Unspecified 11/20/2013, 09/26/2014, 10/24/2015, 11/03/2016  . Tdap 09/19/2016   Pertinent  Health Maintenance Due  Topic Date Due  . INFLUENZA VACCINE  08/07/2018  . DEXA SCAN   Discontinued   Fall Risk  09/15/2017 09/05/2016  Falls in the past year? No No   Functional Status Survey:    Vitals:   07/02/18 0945  BP: 120/70  Pulse: 74  Resp: 18  Temp: 98 F (36.7 C)  TempSrc: Oral  SpO2: 94%  Weight: 155 lb 9.6 oz (70.6 kg)  Height: 5\' 3"  (1.6 m)   Body mass index is 27.56 kg/m. Physical Exam  Constitutional:  Well-developed and well-nourished.  HENT:  Head: Normocephalic.  Mouth/Throat: Oropharynx is clear and moist.  Eyes: Pupils are equal, round, and reactive to light.  Neck: Neck supple.  Cardiovascular: Normal rate and normal heart sounds.  No murmur heard. Pulmonary/Chest: Effort normal and breath sounds normal. No respiratory distress. No wheezes. She has no rales.  Abdominal: Soft. Bowel sounds are normal. No distension. There is no tenderness. There is no rebound.  Musculoskeletal: No edema.  Lymphadenopathy: none Neurological: No Focal Deficits. Was following Commands Today Skin: Skin is warm and dry.  Psychiatric: Withdrawn and depression  Labs reviewed: Recent Labs    07/14/17 04/22/18  NA 142 143  K 3.6 4.2  CL 112  --  CO2 22  --   BUN 11 12  CREATININE 1.0 0.9  CALCIUM 8.9  --    Recent Labs    07/14/17 04/22/18  AST 18 14  ALT 20 16  ALKPHOS 87 84  PROT 6.0  --   ALBUMIN 4.0  --    Recent Labs    07/14/17 02/16/18 04/22/18  WBC 7.7 5.6  5.6  5.6 7.2  HGB 14.7 13.5  12.5  13.5 13.9  HCT 44 40  40  40 40  PLT 260 220  220  220 217   Lab Results  Component Value Date   TSH 1.32 02/16/2018   Lab Results  Component Value Date   HGBA1C 5.5 12/02/2016   Lab Results  Component Value Date   CHOL 174 10/21/2016   HDL 45 10/21/2016   LDLCALC 103 10/21/2016   TRIG 158 10/21/2016    Significant Diagnostic Results in last 30 days  Assessment/Plan  Dementia with Behavior Issues We will continue patient on Zoloft and Remeron for her mood Would not make any changes Continue  supportive care  Depression Not Candidate for any GDR  Essential hypertension Stable on metoprolol  Chronic embolism and thrombosis On Chronic Xarelto  GERD Continue on omeprazole   Family/ staff Communication:   Labs/tests ordered:   Total time spent in this patient care encounter was  25_  minutes; greater than 50% of the visit spent counseling patient and staff, reviewing records , Labs and coordinating care for problems addressed at this encounter.

## 2018-07-25 ENCOUNTER — Encounter (HOSPITAL_COMMUNITY): Payer: Self-pay | Admitting: Emergency Medicine

## 2018-07-25 ENCOUNTER — Emergency Department (HOSPITAL_COMMUNITY): Payer: Medicare Other

## 2018-07-25 ENCOUNTER — Other Ambulatory Visit: Payer: Self-pay

## 2018-07-25 ENCOUNTER — Observation Stay (HOSPITAL_COMMUNITY)
Admission: EM | Admit: 2018-07-25 | Discharge: 2018-07-26 | Disposition: A | Discharge status: Skilled Nursing Facility | Payer: Medicare Other | Attending: Internal Medicine | Admitting: Internal Medicine

## 2018-07-25 DIAGNOSIS — F0390 Unspecified dementia without behavioral disturbance: Secondary | ICD-10-CM

## 2018-07-25 DIAGNOSIS — Z66 Do not resuscitate: Secondary | ICD-10-CM | POA: Insufficient documentation

## 2018-07-25 DIAGNOSIS — Z7901 Long term (current) use of anticoagulants: Secondary | ICD-10-CM | POA: Insufficient documentation

## 2018-07-25 DIAGNOSIS — N3000 Acute cystitis without hematuria: Secondary | ICD-10-CM | POA: Diagnosis not present

## 2018-07-25 DIAGNOSIS — R569 Unspecified convulsions: Principal | ICD-10-CM | POA: Insufficient documentation

## 2018-07-25 DIAGNOSIS — S22070A Wedge compression fracture of T9-T10 vertebra, initial encounter for closed fracture: Secondary | ICD-10-CM

## 2018-07-25 DIAGNOSIS — I10 Essential (primary) hypertension: Secondary | ICD-10-CM | POA: Diagnosis not present

## 2018-07-25 DIAGNOSIS — F329 Major depressive disorder, single episode, unspecified: Secondary | ICD-10-CM | POA: Diagnosis not present

## 2018-07-25 DIAGNOSIS — S22079A Unspecified fracture of T9-T10 vertebra, initial encounter for closed fracture: Secondary | ICD-10-CM | POA: Diagnosis not present

## 2018-07-25 DIAGNOSIS — N390 Urinary tract infection, site not specified: Secondary | ICD-10-CM | POA: Diagnosis not present

## 2018-07-25 DIAGNOSIS — W07XXXA Fall from chair, initial encounter: Secondary | ICD-10-CM | POA: Diagnosis not present

## 2018-07-25 DIAGNOSIS — Z79899 Other long term (current) drug therapy: Secondary | ICD-10-CM | POA: Insufficient documentation

## 2018-07-25 DIAGNOSIS — Z1159 Encounter for screening for other viral diseases: Secondary | ICD-10-CM | POA: Insufficient documentation

## 2018-07-25 DIAGNOSIS — K219 Gastro-esophageal reflux disease without esophagitis: Secondary | ICD-10-CM | POA: Diagnosis not present

## 2018-07-25 DIAGNOSIS — Z86718 Personal history of other venous thrombosis and embolism: Secondary | ICD-10-CM

## 2018-07-25 DIAGNOSIS — R4689 Other symptoms and signs involving appearance and behavior: Secondary | ICD-10-CM | POA: Diagnosis present

## 2018-07-25 DIAGNOSIS — I69320 Aphasia following cerebral infarction: Secondary | ICD-10-CM | POA: Insufficient documentation

## 2018-07-25 DIAGNOSIS — F01518 Vascular dementia, unspecified severity, with other behavioral disturbance: Secondary | ICD-10-CM | POA: Diagnosis present

## 2018-07-25 DIAGNOSIS — F0151 Vascular dementia with behavioral disturbance: Secondary | ICD-10-CM | POA: Insufficient documentation

## 2018-07-25 DIAGNOSIS — G40909 Epilepsy, unspecified, not intractable, without status epilepticus: Secondary | ICD-10-CM

## 2018-07-25 DIAGNOSIS — A419 Sepsis, unspecified organism: Secondary | ICD-10-CM

## 2018-07-25 LAB — COMPREHENSIVE METABOLIC PANEL
ALT: 21 U/L (ref 0–44)
AST: 22 U/L (ref 15–41)
Albumin: 3.4 g/dL — ABNORMAL LOW (ref 3.5–5.0)
Alkaline Phosphatase: 88 U/L (ref 38–126)
Anion gap: 10 (ref 5–15)
BUN: 9 mg/dL (ref 8–23)
CO2: 21 mmol/L — ABNORMAL LOW (ref 22–32)
Calcium: 8.9 mg/dL (ref 8.9–10.3)
Chloride: 110 mmol/L (ref 98–111)
Creatinine, Ser: 0.99 mg/dL (ref 0.44–1.00)
GFR calc Af Amer: 59 mL/min — ABNORMAL LOW (ref >60)
GFR calc non Af Amer: 51 mL/min — ABNORMAL LOW (ref >60)
Glucose, Bld: 108 mg/dL — ABNORMAL HIGH (ref 70–99)
Potassium: 4.1 mmol/L (ref 3.5–5.1)
Sodium: 141 mmol/L (ref 135–145)
Total Bilirubin: 0.4 mg/dL (ref 0.3–1.2)
Total Protein: 6 g/dL — ABNORMAL LOW (ref 6.5–8.1)

## 2018-07-25 LAB — SARS CORONAVIRUS 2 BY RT PCR (HOSPITAL ORDER, PERFORMED IN ~~LOC~~ HOSPITAL LAB): SARS Coronavirus 2: NEGATIVE

## 2018-07-25 LAB — URINALYSIS, ROUTINE W REFLEX MICROSCOPIC
Bilirubin Urine: NEGATIVE
Glucose, UA: NEGATIVE mg/dL
Ketones, ur: NEGATIVE mg/dL
Leukocytes,Ua: NEGATIVE
Nitrite: POSITIVE — AB
Protein, ur: NEGATIVE mg/dL
Specific Gravity, Urine: 1.017 (ref 1.005–1.030)
WBC, UA: >50 WBC/hpf — ABNORMAL HIGH (ref 0–5)
pH: 6 (ref 5.0–8.0)

## 2018-07-25 LAB — CBC
HCT: 47.5 % — ABNORMAL HIGH (ref 36.0–46.0)
Hemoglobin: 15 g/dL (ref 12.0–15.0)
MCH: 30.5 pg (ref 26.0–34.0)
MCHC: 31.6 g/dL (ref 30.0–36.0)
MCV: 96.5 fL (ref 80.0–100.0)
Platelets: 220 10*3/uL (ref 150–400)
RBC: 4.92 MIL/uL (ref 3.87–5.11)
RDW: 13.7 % (ref 11.5–15.5)
WBC: 12 10*3/uL — ABNORMAL HIGH (ref 4.0–10.5)
nRBC: 0 % (ref 0.0–0.2)

## 2018-07-25 LAB — LIPASE, BLOOD: Lipase: 34 U/L (ref 11–51)

## 2018-07-25 LAB — LACTIC ACID, PLASMA
Lactic Acid, Venous: 2.9 mmol/L (ref 0.5–1.9)
Lactic Acid, Venous: 1.3 mmol/L (ref 0.5–1.9)
Lactic Acid, Venous: 1.2 mmol/L (ref 0.5–1.9)

## 2018-07-25 LAB — TROPONIN I (HIGH SENSITIVITY)
Troponin I (High Sensitivity): 9 ng/L (ref <18)
Troponin I (High Sensitivity): 11 ng/L (ref <18)

## 2018-07-25 LAB — D-DIMER, QUANTITATIVE: D-Dimer, Quant: 3.49 ug/mL-FEU — ABNORMAL HIGH (ref 0.00–0.50)

## 2018-07-25 MED ORDER — SODIUM CHLORIDE 0.9 % IV SOLN
INTRAVENOUS | Status: AC
  Administered 2018-07-25: 18:00:00 via INTRAVENOUS

## 2018-07-25 MED ORDER — MIRTAZAPINE 15 MG PO TABS
7.5000 mg | ORAL_TABLET | Freq: Every day | ORAL | Status: DC
  Administered 2018-07-25: 7.5 mg via ORAL
  Filled 2018-07-25: qty 1

## 2018-07-25 MED ORDER — IOHEXOL 350 MG/ML SOLN
75.0000 mL | Freq: Once | INTRAVENOUS | Status: AC | PRN
  Administered 2018-07-25: 15:00:00 75 mL via INTRAVENOUS

## 2018-07-25 MED ORDER — LORATADINE 10 MG PO TABS
10.0000 mg | ORAL_TABLET | Freq: Every day | ORAL | Status: DC
  Administered 2018-07-25 – 2018-07-26 (×2): 10 mg via ORAL
  Filled 2018-07-25 (×2): qty 1

## 2018-07-25 MED ORDER — LEVETIRACETAM IN NACL 1000 MG/100ML IV SOLN
1000.0000 mg | Freq: Once | INTRAVENOUS | Status: AC
  Administered 2018-07-25: 19:00:00 1000 mg via INTRAVENOUS
  Filled 2018-07-25: qty 100

## 2018-07-25 MED ORDER — SERTRALINE HCL 50 MG PO TABS
150.0000 mg | ORAL_TABLET | Freq: Every day | ORAL | Status: DC
  Administered 2018-07-25: 150 mg via ORAL
  Filled 2018-07-25: qty 3

## 2018-07-25 MED ORDER — SODIUM CHLORIDE 0.9 % IV SOLN: 1.0000 g | INTRAVENOUS | Status: DC

## 2018-07-25 MED ORDER — METOPROLOL TARTRATE 12.5 MG HALF TABLET
12.5000 mg | ORAL_TABLET | Freq: Two times a day (BID) | ORAL | Status: DC
  Administered 2018-07-25 – 2018-07-26 (×2): 12.5 mg via ORAL
  Filled 2018-07-25 (×2): qty 1

## 2018-07-25 MED ORDER — PANTOPRAZOLE SODIUM 40 MG PO TBEC
40.0000 mg | DELAYED_RELEASE_TABLET | Freq: Every day | ORAL | Status: DC
  Administered 2018-07-25 – 2018-07-26 (×2): 40 mg via ORAL
  Filled 2018-07-25 (×2): qty 1

## 2018-07-25 MED ORDER — RISPERIDONE 0.5 MG PO TABS
0.2500 mg | ORAL_TABLET | Freq: Two times a day (BID) | ORAL | Status: DC
  Administered 2018-07-25 – 2018-07-26 (×2): 0.25 mg via ORAL
  Filled 2018-07-25 (×2): qty 1

## 2018-07-25 MED ORDER — SODIUM CHLORIDE 0.9 % IV BOLUS
1000.0000 mL | Freq: Once | INTRAVENOUS | Status: AC
  Administered 2018-07-25: 1000 mL via INTRAVENOUS

## 2018-07-25 MED ORDER — BISACODYL 10 MG RE SUPP: 10.0000 mg | Freq: Every day | RECTAL | Status: DC | PRN

## 2018-07-25 MED ORDER — POLYETHYLENE GLYCOL 3350 17 G PO PACK
17.0000 g | PACK | Freq: Every day | ORAL | Status: DC
  Administered 2018-07-25 – 2018-07-26 (×2): 17 g via ORAL
  Filled 2018-07-25: qty 1

## 2018-07-25 MED ORDER — ACETAMINOPHEN 325 MG PO TABS
650.0000 mg | ORAL_TABLET | ORAL | Status: DC | PRN
  Administered 2018-07-26: 650 mg via ORAL
  Filled 2018-07-25: qty 2

## 2018-07-25 MED ORDER — RIVAROXABAN 15 MG PO TABS: 15.0000 mg | ORAL_TABLET | Freq: Every day | ORAL | Status: DC

## 2018-07-25 MED ORDER — LEVETIRACETAM IN NACL 500 MG/100ML IV SOLN: 500.0000 mg | Freq: Two times a day (BID) | INTRAVENOUS | Status: DC

## 2018-07-25 MED ORDER — SERTRALINE HCL 100 MG PO TABS: 100.0000 mg | ORAL_TABLET | Freq: Every day | ORAL | Status: DC

## 2018-07-25 MED ORDER — SODIUM CHLORIDE 0.9 % IV SOLN
2.0000 g | Freq: Once | INTRAVENOUS | Status: AC
  Administered 2018-07-25: 2 g via INTRAVENOUS
  Filled 2018-07-25: qty 20

## 2018-07-25 NOTE — ED Notes (Signed)
Patient transported to CT 

## 2018-07-25 NOTE — ED Provider Notes (Signed)
[name] Emergency Department Provider Note MRN:  [id number]  Arrival date & time: 07/25/18     Chief Complaint   Shaking movement   History of Present Illness   Erica [name] is a 85 y.o. year-old female with a history of encephalopathy, hypertension, stroke presenting to the ED with chief complaint of shaking behavior.  Report of shaking behavior witnessed by family.  Family not present.  Patient is unable to converse.  I was unable to obtain an accurate HPI, PMH, or ROS due to the patient's encephalopathy.  Review of Systems  Positive for shaking behavior.  Patient's Health History    Past Medical History:  Diagnosis Date  . Acute encephalopathy   . Aphasia following unspecified cerebrovascular disease   . 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 Depression   . Dysphagia following cerebrovascular disease   . Hepatic steatosis   . Hydroureteronephrosis   . Hypertension   . Insomnia   . Macular degeneration   . Major depressive disorder, recurrent ([place])   . Neuralgia and neuritis, unspecified (CODE)   . Nontraumatic intracranial hemorrhage ([place])   . Stroke ([place])   . 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   . 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 ABDOMINAL HYSTERECTOMY    . APPENDECTOMY    . BREAST SURGERY    . MASTECTOMY Right     No family history on file.  Social History   Socioeconomic History  . Marital status: Widowed    Spouse name: Not on file  . Number of children: Not on file  . Years of education: Not on file  . Highest education level: Not on file  Occupational History  . Not on file  Social Needs  . Financial resource strain: Not on file  . Food insecurity    Worry: Not on file    Inability: Not on file  . Transportation needs    Medical: Not on file    Non-medical: Not on file  Tobacco Use  .  Smoking status: Never Smoker  . Smokeless tobacco: Never Used  Substance and Sexual Activity  . Alcohol use: No  . Drug use: No  . Sexual activity: Never  Lifestyle  . Physical activity    Days per week: Not on file    Minutes per session: Not on file  . Stress: Not at all  Relationships  . Social Herbalist on phone: Not on file    Gets together: Not on file    Attends religious service: Not on file    Active member of club or organization: Not on file    Attends meetings of clubs or organizations: Not on file    Relationship status: Not on file  . Intimate partner violence    Fear of current or ex partner: Not on file    Emotionally abused: Not on file    Physically abused: Not on file    Forced sexual activity: Not on file  Other Topics Concern  . Not on file  Social History Narrative   [facility] [place] since 04/25/2013   Widowed   Never smoked   Alcohol none   DNR, POA, Living Will     Physical Exam  Vital Signs and Nursing Notes reviewed Vitals:   07/25/18 1200 07/25/18 1422  BP: 117/85 (!) 132/56  Pulse: 72 68  Resp: 18 11  Temp:    SpO2: 97% 98%    CONSTITUTIONAL: Chronically ill-appearing, NAD NEURO:  Alert and oriented x 3, no focal deficits EYES:  eyes equal and reactive ENT/NECK:  no LAD, no JVD CARDIO: Regular rate, well-perfused, normal S1 and S2 PULM:  CTAB no wheezing or rhonchi GI/GU:  normal bowel sounds, non-distended, moderate diffuse abdominal tenderness MSK/SPINE:  No gross deformities, no edema SKIN:  no rash, atraumatic PSYCH:  Appropriate speech and behavior  Diagnostic and Interventional Summary    EKG Interpretation  Date/Time:  Sunday July 25 2018 10:44:01 EDT Ventricular Rate:  75 PR Interval:    QRS Duration: 92 QT Interval:  397 QTC Calculation: 444 R Axis:   -37 Text Interpretation:  Sinus rhythm Prolonged PR interval Left axis deviation Low voltage, precordial leads Abnormal R-wave  progression, late transition Borderline T abnormalities, anterior leads Confirmed by Gerlene Fee [phone]) on 07/25/2018 11:52:27 AM Also confirmed by Gerlene Fee [phone]), editor Philomena Doheny [phone])  on 07/25/2018 1:37:54 PM      Labs Reviewed  CBC - Abnormal; Notable for the following components:      Result Value   WBC 12.0 (*)    HCT 47.5 (*)    All other components within normal limits  COMPREHENSIVE METABOLIC PANEL - Abnormal; Notable for the following components:   CO2 21 (*)    Glucose, Bld 108 (*)    Total Protein 6.0 (*)    Albumin 3.4 (*)    GFR calc non Af Amer 51 (*)    GFR calc Af Amer 59 (*)    All other components within normal limits  LACTIC ACID, PLASMA - Abnormal; Notable for the following components:   Lactic Acid, Venous 2.9 (*)    All other components within normal limits  URINALYSIS, ROUTINE W REFLEX MICROSCOPIC - Abnormal; Notable for the following components:   APPearance HAZY (*)    Hgb urine dipstick SMALL (*)    Nitrite POSITIVE (*)    WBC, UA >50 (*)    Bacteria, UA MANY (*)    All other components within normal limits  D-DIMER, QUANTITATIVE (NOT AT [facility]) - Abnormal; Notable for the following components:   D-Dimer, Quant 3.49 (*)    All other components within normal limits  CULTURE, BLOOD (ROUTINE X 2)  CULTURE, BLOOD (ROUTINE X 2)  SARS CORONAVIRUS 2 (HOSPITAL ORDER, [place] [facility])  LIPASE, BLOOD  TROPONIN I (HIGH SENSITIVITY)  TROPONIN I (HIGH SENSITIVITY)    CT HEAD WO CONTRAST  Final Result    DG Chest Port 1 View  Final Result    CT ANGIO CHEST PE W OR WO CONTRAST    (Results Pending)    Medications  sodium chloride 0.9 % bolus 1,000 mL (1,000 mLs Intravenous New Bag/Given 07/25/18 1252)  cefTRIAXone (ROCEPHIN) 2 g in sodium chloride 0.9 % 100 mL IVPB (0 g Intravenous Stopped 07/25/18 1322)  iohexol (OMNIPAQUE) 350 MG/ML injection 75 mL (75 mLs Intravenous Contrast Given 07/25/18 1505)     Procedures  Critical Care Critical Care Documentation Critical care time provided by me (excluding procedures): 35 minutes  Condition necessitating critical care: Concern for sepsis  Components of critical care management: reviewing of prior records, laboratory and imaging interpretation, frequent re-examination and reassessment of vital signs, administration of IV fluids, IV antibiotics, discussion with consulting services.    ED Course and Medical Decision Making  I  have reviewed the triage vital signs and the nursing notes.  Pertinent labs & imaging results that were available during my care of the patient were reviewed by me and considered in my medical decision making (see below for details).  Question of shaking behavior at home, normal vital signs, patient unable to provide history, moving all extremities on exam, does seem to have abdominal tenderness on exam as well, will obtain more information from family.  Clinical Course as of Jul 25 1519  Sun Jul 25, 2018  1011 Received more information from daughter, who explains that patient became unresponsive, had blue lips, was foaming at the mouth, slid out of her wheelchair, slowly returned to baseline, was provided with oxygen at her care facility.  The possible cyanosis raises concern for cardiopulmonary etiology, will add on troponin and d-dimer to the work-up.   [MB]  1232 Labs reveal an elevated lactate, a leukocytosis, a urinalysis that is concerning for infection.  Given this and her hypothermia on arrival, there is no increased concern for sepsis.  Code sepsis alert initiated, will provide fluids, antibiotics.  Patient's d-dimer is also elevated, will await creatinine to determine appropriateness of CTA imaging.   [MB]    Clinical Course User Index [MB] Maudie Flakes, MD    Creatinine is appropriate for CTA imaging, awaiting results.  Regardless, to be admitted to hospital service given the concern for urosepsis.  Signed out to oncoming  provider at shift change.  Barth Kirks. Sedonia Small, MD [place] [email or web address]  Final Clinical Impressions(s) / ED Diagnoses     ICD-10-CM   1. Sepsis, due to unspecified organism, unspecified whether acute organ dysfunction present ([place])  A41.9   2. Altered behavior  R46.89 DG Chest [facility] 1 View    DG Chest Port 1 View  3. Acute cystitis without hematuria  N30.00     ED Discharge Orders    None         Maudie Flakes, MD 07/25/18 1521

## 2018-07-25 NOTE — H&P (Signed)
History and Physical    Erica [name] ZTI:[id number] DOB: April 15, 1927 DOA: 07/25/2018  I have briefly reviewed the patient's prior medical records in [place]  PCP: Mast, Man X, NP  Patient coming from: SNF / memory unit  Chief Complaint: Unresponsiveness episode  HPI: Erica [name] is a 85 y.o. 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  Her daughter who works at the same nursing home, tells me that this morning she was sitting in the chair and started shaking and having seizure-like activity, fell from the chair to the floor, was foaming at the mouth and had blood coming out of her mouth, and her lips turned blue.  She recovered on her own, her initial vital signs evaluation her blood pressure was in the 825K systolic but then gradually improved.  She was also placed empirically on 2 L of oxygen.  Daughter tells me that when she had a prior stroke 7 years ago she recalls that patient has had seizures back then as well.  No other prior history of seizure DVT.  On my evaluation patient has underlying dementia cannot answer any of the questions.  ED Course: In the ED she is afebrile, normotensive, satting well on 2 L nasal cannula.  Blood work has lactic acid of 2.9, slight white count of 12.0.  High-sensitivity troponin negative x2.  Urinalysis shows no evidence of a UTI.  She had a CT angiogram which was negative for PE. She was given Ceftriaxone and we are asked to admit  Review of Systems: As per HPI otherwise 10 point review of systems negative.   Past Medical History:  Diagnosis Date  . Acute encephalopathy   . Aphasia following unspecified cerebrovascular disease   . 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  Depression   . Dysphagia following cerebrovascular disease   . Hepatic steatosis   . Hydroureteronephrosis   . Hypertension   . Insomnia   . Macular degeneration   . Major depressive disorder, recurrent ([place])   . Neuralgia and neuritis, unspecified ([place])   . Nontraumatic intracranial hemorrhage ([place])   . Stroke ([place])   . 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   . 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 ABDOMINAL HYSTERECTOMY    . APPENDECTOMY    . BREAST SURGERY    . MASTECTOMY Right      reports that she has never smoked. She has never used smokeless tobacco. She reports that she does not drink alcohol or use drugs.  Allergies  Allergen Reactions  . Hydrocodone Nausea And Vomiting  . Morphine And Related Nausea And Vomiting  . Actonel [Risedronate Sodium] Other (See Comments)    unknown  . Darvon [Propoxyphene Hcl] Other (See Comments)    unknown  . Oxycodone Hcl Other (See Comments)    unknown  . Pneumovax [Pneumococcal Polysaccharide Vaccine] Other (See Comments)    unknown    Unable to obtain family history due to underlying dementia  Prior to Admission medications   Medication Sig Start Date End Date Taking? Authorizing Provider  acetaminophen (TYLENOL) 325 MG tablet Take 650 mg by mouth every 4 (four)  hours as needed.   Yes [provider]  bisacodyl (DULCOLAX) 10 MG suppository Place 10 mg rectally daily as needed for mild constipation or moderate constipation.   Yes [provider]  hydrocortisone 2.5 % lotion Apply topically 2 (two) times daily as needed. Apply to itchy  area on face.   Yes [provider]  loratadine (CLARITIN) 10 MG tablet Take 10 mg by mouth daily.   Yes [provider]  metoprolol tartrate (LOPRESSOR) 25 MG tablet Take 12.5 mg by mouth 2 (two) times daily.    Yes [provider]   mirtazapine (REMERON) 15 MG tablet Take 7.5 mg by mouth at bedtime.    Yes [provider]  nystatin (NYSTATIN) powder Apply topically daily as needed. Under left breast    Yes [provider]  omeprazole (PRILOSEC) 10 MG capsule Take 10 mg by mouth daily.   Yes [provider]  polyethylene glycol (MIRALAX / GLYCOLAX) packet Take 17 g by mouth daily.   Yes [provider]  risperiDONE (RISPERDAL) 0.25 MG tablet Take 0.25 mg by mouth 2 (two) times daily.   Yes [provider]  Rivaroxaban (XARELTO) 15 MG TABS tablet Take 1 tablet (15 mg total) by mouth daily at 6 PM. 07/17/16  Yes Vann, Jessica U, DO  sertraline (ZOLOFT) 100 MG tablet Take 100 mg by mouth at bedtime. Take with 50 mg tablet   Yes [provider]  sertraline (ZOLOFT) 50 MG tablet Take 50 mg by mouth at bedtime. Take with 100 mg = 150 mg    Yes [provider]  Simethicone (GAS RELIEF 80 PO) Take 80 mg by mouth 3 (three) times daily with meals.    Yes [provider]  NON FORMULARY MAGIC CUP WITH LUNCH AND DINNER    [provider]    Physical Exam: Vitals:   07/25/18 1130 07/25/18 1200 07/25/18 1422 07/25/18 1430  BP: 113/63 117/85 (!) 132/56 133/62  Pulse: 67 72 68 73  Resp: 13 18 11 13   Temp:      TempSrc:      SpO2: 96% 97% 98% 98%  Weight:        Constitutional: NAD, calm, comfortable Eyes: PERRL, lids and conjunctivae normal ENMT: Mucous membranes are moist. Neck: normal, supple Respiratory: clear to auscultation bilaterally, no wheezing, no crackles. Normal respiratory effort.  Cardiovascular: Regular rate and rhythm, no murmurs / rubs / gallops.  Abdomen: no tenderness, no masses palpated. Bowel sounds positive.  Musculoskeletal: no clubbing / cyanosis. Normal muscle tone.  Skin: no rashes, lesions, ulcers. No induration Neurologic: Does not follow commands but withdraws to pain strength appears equal Psychiatric: Alert and  oriented x0  Labs on Admission: I have personally reviewed following labs and imaging studies  CBC: Recent Labs  Lab 07/25/18 1032  WBC 12.0*  HGB 15.0  HCT 47.5*  MCV 96.5  PLT 563   Basic Metabolic Panel: Recent Labs  Lab 07/25/18 1032  NA 141  K 4.1  CL 110  CO2 21*  GLUCOSE 108*  BUN 9  CREATININE 0.99  CALCIUM 8.9   GFR: Estimated Creatinine Clearance: 36.4 mL/min (by C-G formula based on SCr of 0.99 mg/dL). Liver Function Tests: Recent Labs  Lab 07/25/18 1032  AST 22  ALT 21  ALKPHOS 88  BILITOT 0.4  PROT 6.0*  ALBUMIN 3.4*   Recent Labs  Lab 07/25/18 1032  LIPASE 34   No results for input(s): AMMONIA in the last  168 hours. Coagulation Profile: No results for input(s): INR, PROTIME in the last 168 hours. Cardiac Enzymes: No results for input(s): CKTOTAL, CKMB, CKMBINDEX, TROPONINI in the last 168 hours. BNP (last 3 results) No results for input(s): PROBNP in the last 8760 hours. HbA1C: No results for input(s): HGBA1C in the last 72 hours. CBG: No results for input(s): GLUCAP in the last 168 hours. Lipid Profile: No results for input(s): CHOL, HDL, LDLCALC, TRIG, CHOLHDL, LDLDIRECT in the last 72 hours. Thyroid Function Tests: No results for input(s): TSH, T4TOTAL, FREET4, T3FREE, THYROIDAB in the last 72 hours. Anemia Panel: No results for input(s): VITAMINB12, FOLATE, FERRITIN, TIBC, IRON, RETICCTPCT in the last 72 hours. Urine analysis:    Component Value Date/Time   COLORURINE YELLOW 07/25/2018 1211   APPEARANCEUR HAZY (A) 07/25/2018 1211   LABSPEC 1.017 07/25/2018 1211   PHURINE 6.0 07/25/2018 1211   GLUCOSEU NEGATIVE 07/25/2018 1211   HGBUR SMALL (A) 07/25/2018 1211   BILIRUBINUR NEGATIVE 07/25/2018 1211   KETONESUR NEGATIVE 07/25/2018 1211   PROTEINUR NEGATIVE 07/25/2018 1211   NITRITE POSITIVE (A) 07/25/2018 1211   LEUKOCYTESUR NEGATIVE 07/25/2018 1211     Radiological Exams on Admission: Ct Head Wo Contrast  Result Date:  07/25/2018 CLINICAL DATA:  Possible seizure. EXAM: CT HEAD WITHOUT CONTRAST TECHNIQUE: Contiguous axial images were obtained from the base of the skull through the vertex without intravenous contrast. COMPARISON:  07/13/2016. FINDINGS: Brain: No significant change in mild-to-moderate enlargement of the ventricles and subarachnoid spaces. Stable mild patchy white matter low density in both cerebral hemispheres. Stable old left occipital and posterior watershed infarct. Stable right lateral hygroma measuring 15.5 mm in thickness with no significant mass effect. No intracranial hemorrhage, mass lesion or CT evidence of acute infarction. Vascular: No hyperdense vessel or unexpected calcification. Skull: Normal. Negative for fracture or focal lesion. Sinuses/Orbits: Status post bilateral cataract extraction. Small sphenoid sinus retention cyst. Other: None. IMPRESSION: 1. No acute abnormality. 2. Stable atrophy, chronic small vessel white matter ischemic changes and old infarcts, as described above. Electronically Signed   By: Claudie Revering M.D.   On: 07/25/2018 11:25   Ct Angio Chest Pe W Or Wo Contrast  Result Date: 07/25/2018 CLINICAL DATA:  Shortness of breath with elevated D-dimer. EXAM: CT ANGIOGRAPHY CHEST WITH CONTRAST TECHNIQUE: Multidetector CT imaging of the chest was performed using the standard protocol during bolus administration of intravenous contrast. Multiplanar CT image reconstructions and MIPs were obtained to evaluate the vascular anatomy. CONTRAST:  4mL OMNIPAQUE IOHEXOL 350 MG/ML SOLN COMPARISON:  10/11/2014. FINDINGS: Cardiovascular: Examination is limited by mild motion artifact.Given this limitation, no PE was identified. The main pulmonary artery is within normal limits for size. There is no CT evidence of acute right heart strain. The visualized aorta is normal. Heart size is mildly enlarged. Mediastinum/Nodes: --No mediastinal or hilar lymphadenopathy. --No axillary lymphadenopathy. --No  supraclavicular lymphadenopathy. --Normal thyroid gland. --The esophagus is unremarkable Lungs/Pleura: There is stable pleural thickening and calcification along the peripheral right upper lobe. There is atelectasis at the lung bases. There is no significant pleural effusion. No pneumothorax. The lung volumes are low. The trachea is unremarkable. Upper Abdomen: There is high-density material in the upper poles of both kidneys. This is favored to represent excretion of contrast as opposed to nonobstructing stones. There is moderate stenosis at the origin of the celiac axis with some poststenotic dilatation of the proximal celiac trunk. Musculoskeletal: There is a sclerotic band through the T9 vertebral body which is new since prior  study. There is no other acute appearing osseous abnormality. Review of the MIP images confirms the above findings. IMPRESSION: 1. No PE identified. 2. Low lung volumes with bibasilar atelectasis. 3. Age-indeterminate fracture of the T9 vertebral body. Correlation with physical exam and patient's symptoms is recommended. This is new since CT in 2018. Aortic Atherosclerosis (ICD10-I70.0). Electronically Signed   By: Constance Holster M.D.   On: 07/25/2018 15:39   Dg Chest Port 1 View  Result Date: 07/25/2018 CLINICAL DATA:  Altered behavior, possible seizure. EXAM: PORTABLE CHEST 1 VIEW COMPARISON:  Chest x-rays dated 07/13/2016 and 08/20/2014. FINDINGS: Stable cardiomegaly. Lungs are clear. No pleural effusion or pneumothorax seen. Osseous structures about the chest are unremarkable. IMPRESSION: No active disease. No evidence of pneumonia or pulmonary edema. Stable cardiomegaly. Electronically Signed   By: Franki Cabot M.D.   On: 07/25/2018 10:45    EKG: Independently reviewed. Sinus rhythm   Assessment/Plan Active Problems:   Seizure (HCC)   Principal Problem Likely seizure activity -Per description of the nursing home episode this morning consistent with seizure  activity.  She did have a seizure with a prior stroke in the past.  I have consulted neurology, given concern for recurrent seizure as well as known prior stroke they recommend to start antiepileptics, 1 g Keppra loading and 500 twice daily -Obtain EEG  Active Problems UTI -Possibly playing a role, she was started on ceftriaxone, continue, continue to monitor urine cultures  Advanced dementia -Continue home medications  Hypertension -Continue home medications  History of DVTs -On Xarelto, continue   DVT prophylaxis: On Xarelto Code Status: DNR Family Communication: Discussed with daughter over the phone Disposition Plan: Back to SNF when stable Consults called: Neurology   Marzetta Board, MD, PhD [facility]  Contact via [email or web address]  [facility] Office Info P: [phone] F: [phone]   07/25/2018, 4:33 PM

## 2018-07-25 NOTE — Progress Notes (Signed)
Notified bedside nurse of need to draw lactic acid.  

## 2018-07-25 NOTE — ED Triage Notes (Signed)
Per [facility] pt coming from [facility] facility. EMS states daughter was visiting patient when she had a shaking spell. Daughter unsure of seizure activity, denies any history. No post ictal period. Patient has history of vascular dementia and hallucinations. CBG 149

## 2018-07-25 NOTE — ED Notes (Signed)
Daughter Lannie Heaps) is here and would like to be called if needed or with any updates.   [phone]

## 2018-07-25 NOTE — Progress Notes (Signed)
Spoke with ED charge RN about repeat lactate needed much earlier today. Unable to reach pt's RN by phone. Sent secure chat to pt's RN. Charge RN to check on this.

## 2018-07-25 NOTE — Progress Notes (Signed)
Pt received from ED, disoriented times 4 except moaning at times, initial assessment done, unable to finish admission questionnaire this time as pt is not able to answer the questions, seizure pad applied, waiting for Keppra IV from pharmacy, just talked with them, will continue to monitor the patient  Palma Holter, RN

## 2018-07-25 NOTE — ED Provider Notes (Signed)
Had an episode of loss of tone, foaming at the mouth and unresponsive at the nursing home. Workup in the ER demonstrates UTI. Elevated lactate, elevated WBC count. IV abx given. Will watch overnight on telemetry  Ct Head Wo Contrast  Result Date: 07/25/2018 CLINICAL DATA:  Possible seizure. EXAM: CT HEAD WITHOUT CONTRAST TECHNIQUE: Contiguous axial images were obtained from the base of the skull through the vertex without intravenous contrast. COMPARISON:  07/13/2016. FINDINGS: Brain: No significant change in mild-to-moderate enlargement of the ventricles and subarachnoid spaces. Stable mild patchy white matter low density in both cerebral hemispheres. Stable old left occipital and posterior watershed infarct. Stable right lateral hygroma measuring 15.5 mm in thickness with no significant mass effect. No intracranial hemorrhage, mass lesion or CT evidence of acute infarction. Vascular: No hyperdense vessel or unexpected calcification. Skull: Normal. Negative for fracture or focal lesion. Sinuses/Orbits: Status post bilateral cataract extraction. Small sphenoid sinus retention cyst. Other: None. IMPRESSION: 1. No acute abnormality. 2. Stable atrophy, chronic small vessel white matter ischemic changes and old infarcts, as described above. Electronically Signed   By: Claudie Revering M.D.   On: 07/25/2018 11:25   Ct Angio Chest Pe W Or Wo Contrast  Result Date: 07/25/2018 CLINICAL DATA:  Shortness of breath with elevated D-dimer. EXAM: CT ANGIOGRAPHY CHEST WITH CONTRAST TECHNIQUE: Multidetector CT imaging of the chest was performed using the standard protocol during bolus administration of intravenous contrast. Multiplanar CT image reconstructions and MIPs were obtained to evaluate the vascular anatomy. CONTRAST:  97mL OMNIPAQUE IOHEXOL 350 MG/ML SOLN COMPARISON:  10/11/2014. FINDINGS: Cardiovascular: Examination is limited by mild motion artifact.Given this limitation, no PE was identified. The main pulmonary  artery is within normal limits for size. There is no CT evidence of acute right heart strain. The visualized aorta is normal. Heart size is mildly enlarged. Mediastinum/Nodes: --No mediastinal or hilar lymphadenopathy. --No axillary lymphadenopathy. --No supraclavicular lymphadenopathy. --Normal thyroid gland. --The esophagus is unremarkable Lungs/Pleura: There is stable pleural thickening and calcification along the peripheral right upper lobe. There is atelectasis at the lung bases. There is no significant pleural effusion. No pneumothorax. The lung volumes are low. The trachea is unremarkable. Upper Abdomen: There is high-density material in the upper poles of both kidneys. This is favored to represent excretion of contrast as opposed to nonobstructing stones. There is moderate stenosis at the origin of the celiac axis with some poststenotic dilatation of the proximal celiac trunk. Musculoskeletal: There is a sclerotic band through the T9 vertebral body which is new since prior study. There is no other acute appearing osseous abnormality. Review of the MIP images confirms the above findings. IMPRESSION: 1. No PE identified. 2. Low lung volumes with bibasilar atelectasis. 3. Age-indeterminate fracture of the T9 vertebral body. Correlation with physical exam and patient's symptoms is recommended. This is new since CT in 2018. Aortic Atherosclerosis (ICD10-I70.0). Electronically Signed   By: Constance Holster M.D.   On: 07/25/2018 15:39   Dg Chest Port 1 View  Result Date: 07/25/2018 CLINICAL DATA:  Altered behavior, possible seizure. EXAM: PORTABLE CHEST 1 VIEW COMPARISON:  Chest x-rays dated 07/13/2016 and 08/20/2014. FINDINGS: Stable cardiomegaly. Lungs are clear. No pleural effusion or pneumothorax seen. Osseous structures about the chest are unremarkable. IMPRESSION: No active disease. No evidence of pneumonia or pulmonary edema. Stable cardiomegaly. Electronically Signed   By: Franki Cabot M.D.   On:  07/25/2018 10:45     EKG Interpretation  Date/Time:  Sunday July 25 2018 10:44:01 EDT Ventricular Rate:  75 PR Interval:    QRS Duration: 92 QT Interval:  397 QTC Calculation: 444 R Axis:   -37 Text Interpretation:  Sinus rhythm Prolonged PR interval Left axis deviation Low voltage, precordial leads Abnormal R-wave progression, late transition Borderline T abnormalities, anterior leads Confirmed by Gerlene Fee [phone]) on 07/25/2018 11:52:27 AM Also confirmed by Gerlene Fee [phone]), editor Philomena Doheny [phone])  on 07/25/2018 1:37:54 PM         Jola Schmidt, MD 07/25/18 1614

## 2018-07-25 NOTE — Consult Note (Signed)
NEURO HOSPITALIST CONSULT NOTE   Requestig physician: Dr. Cruzita Lederer  Reason for Consult: Recurrence of seizure  History obtained from:    Chart    HPI:                                                                                                                                          Erica [name] is an 85 y.o. female with receptive aphasia secondary to prior stroke, prior non-traumatic ICH, HTN and vascular dementia who presents form her SNF after having a shaking spell. Daughter was unsure if this was due to a seizure and there was apparently no post-ictal period. She has had a seizure in the past in association with a stroke, but was not on medications for this at her SNF. She was loaded with Keppra after initial assessment this admission.   CT head revealed no acute abnormality. Stable atrophy, chronic small vessel white matter ischemic changes and old infarcts were noted.   Labs revealed normal white count, glucose of 110, BUN/Cr of 7/1.15 with eGFR of 42, uncorrected calcium of 8.3 (albumin is also low), normal lactate, and hazy urine with positive nitrite and many bacteria.  CXR showed no evidence for PNA. Covid testing was negative.    Past Medical History:  Diagnosis Date  . Acute encephalopathy   . Aphasia following unspecified cerebrovascular disease   . 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 Depression   . Dysphagia following cerebrovascular disease   . Hepatic steatosis   . Hydroureteronephrosis   . Hypertension   . Insomnia   . Macular degeneration   . Major depressive disorder, recurrent ([place])   . Neuralgia and neuritis, unspecified (CODE)   . Nontraumatic intracranial hemorrhage ([place])   . Stroke ([place])   . 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   . 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 ABDOMINAL HYSTERECTOMY    .  APPENDECTOMY    . BREAST SURGERY    . MASTECTOMY Right     No family history on file.            Social History:  reports that she has never smoked. She has never used smokeless tobacco. She reports that she does not drink alcohol or use drugs.  Allergies  Allergen Reactions  . Hydrocodone Nausea And Vomiting  . Morphine And Related Nausea And Vomiting  . Actonel [Risedronate Sodium] Other (See Comments)    unknown  . Darvon [Propoxyphene Hcl] Other (See Comments)    unknown  . Oxycodone Hcl Other (See Comments)    unknown  . Pneumovax [Pneumococcal Polysaccharide Vaccine] Other (See Comments)  unknown    MEDICATIONS:                                                                                                                      No current facility-administered medications on file prior to encounter.    Current Outpatient Medications on File Prior to Encounter  Medication Sig Dispense Refill  . acetaminophen (TYLENOL) 325 MG tablet Take 650 mg by mouth every 4 (four) hours as needed.    . bisacodyl (DULCOLAX) 10 MG suppository Place 10 mg rectally daily as needed for mild constipation or moderate constipation.    . hydrocortisone 2.5 % lotion Apply topically 2 (two) times daily as needed. Apply to itchy  area on face.    . loratadine (CLARITIN) 10 MG tablet Take 10 mg by mouth daily.    . metoprolol tartrate (LOPRESSOR) 25 MG tablet Take 12.5 mg by mouth 2 (two) times daily.     . mirtazapine (REMERON) 15 MG tablet Take 7.5 mg by mouth at bedtime.     Marland Kitchen nystatin (NYSTATIN) powder Apply topically daily as needed. Under left breast     . omeprazole (PRILOSEC) 10 MG capsule Take 10 mg by mouth daily.    . polyethylene glycol (MIRALAX / GLYCOLAX) packet Take 17 g by mouth daily.    . risperiDONE (RISPERDAL) 0.25 MG tablet Take 0.25 mg by mouth 2 (two) times daily.    . Rivaroxaban (XARELTO) 15 MG TABS tablet Take 1 tablet (15 mg total) by mouth daily at 6 PM. 42 tablet   .  sertraline (ZOLOFT) 100 MG tablet Take 100 mg by mouth at bedtime. Take with 50 mg tablet    . sertraline (ZOLOFT) 50 MG tablet Take 50 mg by mouth at bedtime. Take with 100 mg = 150 mg     . Simethicone (GAS RELIEF 80 PO) Take 80 mg by mouth 3 (three) times daily with meals.     . NON FORMULARY MAGIC CUP WITH LUNCH AND DINNER       ROS:                                                                                                                                       Unable to obtain due to cognitive impairment and receptive aphasia.    Blood pressure (!) 151/69, pulse 72, temperature 98.6 F (37 C), resp. rate 15, weight 70.8 kg,  SpO2 96 %.   General Examination:                                                                                                       Physical Exam  HEENT-  [place]/AT    Lungs- Respirations unlabored Extremities- No edema. Warm and well-perfused  Neurological Examination Mental Status: Awake. Speech output is fluent and nondysarthric but consists of sentences mostly unrelated to questions asked or other external cues. Frequent stereotypical exclamations during exam. Does not answer questions such as her name or circumstance. Does not follow verbal commands but will respond to some visual cues for motor testing.  Cranial Nerves: II: Unable to test visual fields due to cognitive impairment. Will fixate on examiners face and track it to left and right. PERRL.  III,IV, VI: No ptosis. Horizontal EOMI. No nystagmus.  V,VII: No facial droop but will not smile to command. Unable to test sensation without applying noxious stimulus (deferred).  VIII: Unable to formally test hearing.  IX,X: No hypophonia XI: Head is midline XII: Does not protrude tongue to command.  Motor: Resists examiner equally with upper and lower extremities, 4 to 4+/5 strength symmetrically.  Sensory: Reacts to plantar stimulation briskly bilaterally  Deep Tendon Reflexes: 2+ brachioradialis  and biceps bilaterally. Hypoactive patellar reflexes bilaterally.  Toes equivocal bilaterally  Cerebellar/Gait: Unable to assess.     Lab Results: Basic Metabolic Panel: Recent Labs  Lab 07/25/18 1032  NA 141  K 4.1  CL 110  CO2 21*  GLUCOSE 108*  BUN 9  CREATININE 0.99  CALCIUM 8.9    CBC: Recent Labs  Lab 07/25/18 1032  WBC 12.0*  HGB 15.0  HCT 47.5*  MCV 96.5  PLT 220    Cardiac Enzymes: No results for input(s): CKTOTAL, CKMB, CKMBINDEX, TROPONINI in the last 168 hours.  Lipid Panel: No results for input(s): CHOL, TRIG, HDL, CHOLHDL, VLDL, LDLCALC in the last 168 hours.  Imaging: Ct Head Wo Contrast  Result Date: 07/25/2018 CLINICAL DATA:  Possible seizure. EXAM: CT HEAD WITHOUT CONTRAST TECHNIQUE: Contiguous axial images were obtained from the base of the skull through the vertex without intravenous contrast. COMPARISON:  07/13/2016. FINDINGS: Brain: No significant change in mild-to-moderate enlargement of the ventricles and subarachnoid spaces. Stable mild patchy white matter low density in both cerebral hemispheres. Stable old left occipital and posterior watershed infarct. Stable right lateral hygroma measuring 15.5 mm in thickness with no significant mass effect. No intracranial hemorrhage, mass lesion or CT evidence of acute infarction. Vascular: No hyperdense vessel or unexpected calcification. Skull: Normal. Negative for fracture or focal lesion. Sinuses/Orbits: Status post bilateral cataract extraction. Small sphenoid sinus retention cyst. Other: None. IMPRESSION: 1. No acute abnormality. 2. Stable atrophy, chronic small vessel white matter ischemic changes and old infarcts, as described above. Electronically Signed   By: Claudie Revering M.D.   On: 07/25/2018 11:25   Ct Angio Chest Pe W Or Wo Contrast  Result Date: 07/25/2018 CLINICAL DATA:  Shortness of breath with elevated D-dimer. EXAM: CT ANGIOGRAPHY CHEST WITH CONTRAST TECHNIQUE: Multidetector CT imaging  of  the chest was performed using the standard protocol during bolus administration of intravenous contrast. Multiplanar CT image reconstructions and MIPs were obtained to evaluate the vascular anatomy. CONTRAST:  61m OMNIPAQUE IOHEXOL 350 MG/ML SOLN COMPARISON:  10/11/2014. FINDINGS: Cardiovascular: Examination is limited by mild motion artifact.Given this limitation, no PE was identified. The main pulmonary artery is within normal limits for size. There is no CT evidence of acute right heart strain. The visualized aorta is normal. Heart size is mildly enlarged. Mediastinum/Nodes: --No mediastinal or hilar lymphadenopathy. --No axillary lymphadenopathy. --No supraclavicular lymphadenopathy. --Normal thyroid gland. --The esophagus is unremarkable Lungs/Pleura: There is stable pleural thickening and calcification along the peripheral right upper lobe. There is atelectasis at the lung bases. There is no significant pleural effusion. No pneumothorax. The lung volumes are low. The trachea is unremarkable. Upper Abdomen: There is high-density material in the upper poles of both kidneys. This is favored to represent excretion of contrast as opposed to nonobstructing stones. There is moderate stenosis at the origin of the celiac axis with some poststenotic dilatation of the proximal celiac trunk. Musculoskeletal: There is a sclerotic band through the T9 vertebral body which is new since prior study. There is no other acute appearing osseous abnormality. Review of the MIP images confirms the above findings. IMPRESSION: 1. No PE identified. 2. Low lung volumes with bibasilar atelectasis. 3. Age-indeterminate fracture of the T9 vertebral body. Correlation with physical exam and patient's symptoms is recommended. This is new since CT in 2018. Aortic Atherosclerosis (ICD10-I70.0). Electronically Signed   By: CConstance HolsterM.D.   On: 07/25/2018 15:39   Dg Chest Port 1 View  Result Date: 07/25/2018 CLINICAL DATA:  Altered  behavior, possible seizure. EXAM: PORTABLE CHEST 1 VIEW COMPARISON:  Chest x-rays dated 07/13/2016 and 08/20/2014. FINDINGS: Stable cardiomegaly. Lungs are clear. No pleural effusion or pneumothorax seen. Osseous structures about the chest are unremarkable. IMPRESSION: No active disease. No evidence of pneumonia or pulmonary edema. Stable cardiomegaly. Electronically Signed   By: SFranki CabotM.D.   On: 07/25/2018 10:45    Assessment: 85year old female presenting with shaking spell 1. Was loaded with Keppra 1000 mg and started on 500 mg BID 2. Neurological exam without jerking, twitching, posturing, eye deviation or other findings to suggest a seizure 3. Has receptive aphasia from prior stroke  Recommendations: 1. EEG has been ordered to rule out subclinical seizure activity 2. Continue Keppra 500 mg BID 3. Seizure precautions   Electronically signed: Dr. EKerney Elbe7/19/2020, 7:46 PM

## 2018-07-26 DIAGNOSIS — R569 Unspecified convulsions: Secondary | ICD-10-CM

## 2018-07-26 DIAGNOSIS — S22070A Wedge compression fracture of T9-T10 vertebra, initial encounter for closed fracture: Secondary | ICD-10-CM

## 2018-07-26 DIAGNOSIS — N3000 Acute cystitis without hematuria: Secondary | ICD-10-CM | POA: Diagnosis not present

## 2018-07-26 LAB — CBC
HCT: 41.4 % (ref 36.0–46.0)
Hemoglobin: 13.2 g/dL (ref 12.0–15.0)
MCH: 30.5 pg (ref 26.0–34.0)
MCHC: 31.9 g/dL (ref 30.0–36.0)
MCV: 95.6 fL (ref 80.0–100.0)
Platelets: 214 10*3/uL (ref 150–400)
RBC: 4.33 MIL/uL (ref 3.87–5.11)
RDW: 13.8 % (ref 11.5–15.5)
WBC: 7.7 10*3/uL (ref 4.0–10.5)
nRBC: 0 % (ref 0.0–0.2)

## 2018-07-26 LAB — COMPREHENSIVE METABOLIC PANEL
ALT: 19 U/L (ref 0–44)
AST: 26 U/L (ref 15–41)
Albumin: 3.2 g/dL — ABNORMAL LOW (ref 3.5–5.0)
Alkaline Phosphatase: 82 U/L (ref 38–126)
Anion gap: 8 (ref 5–15)
BUN: 7 mg/dL — ABNORMAL LOW (ref 8–23)
CO2: 22 mmol/L (ref 22–32)
Calcium: 8.3 mg/dL — ABNORMAL LOW (ref 8.9–10.3)
Chloride: 113 mmol/L — ABNORMAL HIGH (ref 98–111)
Creatinine, Ser: 1.15 mg/dL — ABNORMAL HIGH (ref 0.44–1.00)
GFR calc Af Amer: 49 mL/min — ABNORMAL LOW (ref >60)
GFR calc non Af Amer: 42 mL/min — ABNORMAL LOW (ref >60)
Glucose, Bld: 110 mg/dL — ABNORMAL HIGH (ref 70–99)
Potassium: 3.7 mmol/L (ref 3.5–5.1)
Sodium: 143 mmol/L (ref 135–145)
Total Bilirubin: 0.5 mg/dL (ref 0.3–1.2)
Total Protein: 5.7 g/dL — ABNORMAL LOW (ref 6.5–8.1)

## 2018-07-26 LAB — BLOOD CULTURE ID PANEL (REFLEXED)

## 2018-07-26 MED ORDER — LEVETIRACETAM 500 MG PO TABS
500.0000 mg | ORAL_TABLET | Freq: Two times a day (BID) | ORAL | Status: DC
  Administered 2018-07-26: 500 mg via ORAL
  Filled 2018-07-26: qty 1

## 2018-07-26 MED ORDER — LEVETIRACETAM 500 MG PO TABS: 500.0000 mg | ORAL_TABLET | Freq: Two times a day (BID) | ORAL | 3 refills | Status: DC

## 2018-07-26 MED ORDER — CEPHALEXIN 500 MG PO CAPS
500.0000 mg | ORAL_CAPSULE | Freq: Two times a day (BID) | ORAL | Status: DC
  Administered 2018-07-26: 500 mg via ORAL
  Filled 2018-07-26: qty 1

## 2018-07-26 MED ORDER — HALOPERIDOL LACTATE 5 MG/ML IJ SOLN
5.0000 mg | Freq: Three times a day (TID) | INTRAMUSCULAR | Status: DC | PRN
  Administered 2018-07-26: 5 mg via INTRAMUSCULAR
  Filled 2018-07-26: qty 1

## 2018-07-26 MED ORDER — SODIUM CHLORIDE 0.9 % IV SOLN: INTRAVENOUS | Status: DC

## 2018-07-26 MED ORDER — CEPHALEXIN 500 MG PO CAPS: 500.0000 mg | ORAL_CAPSULE | Freq: Two times a day (BID) | ORAL | 0 refills | Status: AC

## 2018-07-26 MED ORDER — HALOPERIDOL 5 MG PO TABS
5.0000 mg | ORAL_TABLET | Freq: Three times a day (TID) | ORAL | Status: DC | PRN
  Filled 2018-07-26: qty 1

## 2018-07-26 NOTE — Progress Notes (Signed)
Pt given discharge instructions, prescriptions, and care notes. Pt verbalized understanding AEB no further questions or concerns at this time. IV was discontinued, no redness, pain, or swelling noted at this time. Telemetry discontinued and Centralized Telemetry was notified. Pt left the floor via stretcher with PTAR in stable condition.

## 2018-07-26 NOTE — Care Management Obs Status (Signed)
[facility] NOTIFICATION   Patient Details  Name: Erica [name] MRN: [id number] Date of Birth: [birth year]   Medicare Observation Status Notification Given:       Benard Halsted, LCSW 07/26/2018, 2:42 PM

## 2018-07-26 NOTE — TOC Initial Note (Signed)
Transition of Care [facility]) - Initial/Assessment Note    Patient Details  Name: Erica [name] MRN: [id number] Date of Birth: 08-Sep-1927  [facility] CM/SW Contact:    Benard Halsted, LCSW Phone Number: 07/26/2018, 1:34 PM  Clinical Narrative:                 CSW received consult for possible SNF placement at time of discharge. CSW spoke with patient's daughter at bedside. Patient resides at [facility] and facility is aware she will return today. No further questions reported at this time. CSW to continue to follow and assist with discharge planning needs.   Expected Discharge Plan: Skilled Nursing Facility Barriers to Discharge: No Barriers Identified   Patient Goals and CMS Choice Patient states their goals for this hospitalization and ongoing recovery are:: Return to SNF CMS Medicare.gov Compare Post Acute Care list provided to:: Patient Represenative (must comment)(NA-From SNF) Choice offered to / list presented to : NA  Expected Discharge Plan and Services Expected Discharge Plan: [place] In-house Referral: Clinical Social Work Discharge Planning Services: NA Post Acute Care Choice: [place] [place] Living arrangements for the past 2 months: [place]                 DME Arranged: N/A         HH Arranged: NA          Prior Living Arrangements/Services Living arrangements for the past 2 months: [place] Lives with:: Facility Resident Patient language and need for interpreter reviewed:: Yes Do you feel safe going back to the place where you live?: Yes      Need for Family Participation in Patient Care: Yes (Comment) Care giver support system in place?: Yes (comment)   Criminal Activity/Legal Involvement Pertinent to Current Situation/Hospitalization: No - Comment as needed  Activities of Daily Living      Permission Sought/Granted Permission sought to share information with :  Facility Sport and exercise psychologist, Family Supports Permission granted to share information with : No  Share Information with NAME: [name]  Permission granted to share info w AGENCY: [place] granted to share info w Relationship: Daughter  Permission granted to share info w Contact Information: [phone]  Emotional Assessment Appearance:: Appears stated age Attitude/Demeanor/Rapport: Unable to Assess Affect (typically observed): Pleasant Orientation: : (Disoriented x4) Alcohol / Substance Use: Not Applicable Psych Involvement: No (comment)  Admission diagnosis:  Acute cystitis without hematuria [N30.00] Altered behavior [R46.89] Sepsis, due to unspecified organism, unspecified whether acute organ dysfunction present [facility]) [A41.9] Patient Active Problem List   Diagnosis Date Noted  . Seizure ([place]) 07/25/2018  . Adult failure to thrive 06/11/2017  . Advance care planning 03/19/2017  . Gait abnormality 12/08/2016  . History of CVA (cerebrovascular accident) 10/17/2016  . Psychosis ([place]) 08/21/2016  . Hallucination 08/14/2016  . Dysarthria following cerebrovascular accident 07/22/2016  . Depression due to dementia ([place]) 07/22/2016  . Vascular dementia with behavior disturbance ([place]) 07/22/2016  . Hydroureteronephrosis   . Stroke ([place])   . Dysphagia following cerebrovascular disease   . 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 Nontraumatic intracranial hemorrhage ([place])   . 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   . Neuralgia and neuritis, unspecified ([place])   . Major depressive disorder, recurrent ([place])   . Chronic constipation 01/29/2015  . Mass of left breast on mammogram 12/15/2013  . Sinusitis, chronic 11/30/2013  . Solitary bone  cyst of left forearm 11/10/2013  . SCC (squamous cell carcinoma), scalp/neck 11/10/2013  . GERD (gastroesophageal reflux disease) 09/29/2012  . Protein C deficiency ([place]) 06/14/2012  .  Protein S deficiency ([place]) 06/14/2012  . DNR (do not resuscitate) 06/14/2012  . 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 Essential hypertension, benign 03/24/2012  . History of DVT (deep vein thrombosis) 03/24/2012  . Dysphagia 03/24/2012  . CVA (cerebral vascular accident) ([place]) 03/24/2012  . Osteoporosis 03/24/2012  . Macular degeneration 03/24/2012  . Aphasia 03/24/2012  . Dyslipidemia 03/24/2012   PCP:  Mast, Man X, NP Pharmacy:  No Pharmacies Listed    Social Determinants of Health (SDOH) Interventions    Readmission Risk Interventions No flowsheet data found.

## 2018-07-26 NOTE — Progress Notes (Signed)
PHARMACY - PHYSICIAN COMMUNICATION CRITICAL VALUE ALERT - BLOOD CULTURE IDENTIFICATION (BCID)  Erica [name] is an 85 y.o. female who presented to [facility] on 07/25/2018 with a chief complaint of episode of unresponsiveness at nursing home.  Assessment:  Patient had possible seizure and/or UTI associated altered mental status. Patient was placed on Keflex for possible UTI. Blood cultures are now showing GPC in 1 out of 4 bottles and BCID report is Coagulase negative staph - no resistance detected. WBC is within normal limits. Patient is currently afebrie. Urine culture is still pending.   Name of physician (or Provider) Contacted: Dr Maylene Roes  Current antibiotics: Keflex  Changes to prescribed antibiotics recommended:  Patient is on recommended antibiotics - No changes needed  Plan to follow-up urine culture for further changes.   Results for orders placed or performed during the hospital encounter of 07/25/18  Blood Culture ID Panel (Reflexed) (Collected: 07/25/2018 12:33 PM)  Result Value Ref Range   Enterococcus species NOT DETECTED NOT DETECTED   Listeria monocytogenes NOT DETECTED NOT DETECTED   Staphylococcus species DETECTED (A) NOT DETECTED   Staphylococcus aureus (BCID) NOT DETECTED NOT DETECTED   Methicillin resistance NOT DETECTED NOT DETECTED   Streptococcus species NOT DETECTED NOT DETECTED   Streptococcus agalactiae NOT DETECTED NOT DETECTED   Streptococcus pneumoniae NOT DETECTED NOT DETECTED   Streptococcus pyogenes NOT DETECTED NOT DETECTED   Acinetobacter baumannii NOT DETECTED NOT DETECTED   Enterobacteriaceae species NOT DETECTED NOT DETECTED   Enterobacter cloacae complex NOT DETECTED NOT DETECTED   Escherichia coli NOT DETECTED NOT DETECTED   Klebsiella oxytoca NOT DETECTED NOT DETECTED   Klebsiella pneumoniae NOT DETECTED NOT DETECTED   Proteus species NOT DETECTED NOT DETECTED   Serratia marcescens NOT DETECTED NOT DETECTED   Haemophilus influenzae NOT DETECTED  NOT DETECTED   Neisseria meningitidis NOT DETECTED NOT DETECTED   Pseudomonas aeruginosa NOT DETECTED NOT DETECTED   Candida albicans NOT DETECTED NOT DETECTED   Candida glabrata NOT DETECTED NOT DETECTED   Candida krusei NOT DETECTED NOT DETECTED   Candida parapsilosis NOT DETECTED NOT DETECTED   Candida tropicalis NOT DETECTED NOT DETECTED    Brain Hilts 07/26/2018  4:00 PM

## 2018-07-26 NOTE — Progress Notes (Signed)
Patient refused EEG. She became very agitated and began crying and screaming "absolutely not".

## 2018-07-26 NOTE — Progress Notes (Signed)
PT Cancellation Note  Patient Details Name: Erica [name] MRN: [id number] DOB: [birth year]   Cancelled Treatment:    Reason Eval/Treat Not Completed: Other (comment).  Pt is having confusion and daughter declined PT, stating pt is headed to SNF as soon as her orders are ready.  Will reattempt tomorrow if pt is still here.   Ramond Dial 07/26/2018, 3:15 PM   Mee Hives, PT MS Acute Rehab Dept. Number: [place] and [place]

## 2018-07-26 NOTE — Progress Notes (Signed)
Pts daughter at bedside  

## 2018-07-26 NOTE — Discharge Summary (Signed)
Physician Discharge Summary  Bird Tailor PYK:[id number] DOB: May 05, 1927 DOA: 07/25/2018  PCP: Mast, Man X, NP  Admit date: 07/25/2018 Discharge date: 07/26/2018  Admitted From: SNF Disposition:  SNF  Recommendations for Outpatient Follow-up:  1. Follow up with PCP in 1 week 2. If patient becomes more lethargic on keppra, decrease dose by 50%  3. Patient was also treated for UTI with rocephin --> keflex. Urine culture is pending at time of discharge, please follow up. Blood culture showed 1 set of gram positive cocci and second set no growth (confirmed with [facility]bio lab). This likely represents contamination; she is non septic, no fevers, WBC normal and lactic acid normal.   Discharge Condition: Stable CODE STATUS: DNR  Diet recommendation: Regular   Brief/Interim Summary: H&P by Dr. Cruzita Lederer: Erica [name] is a 85 y.o. 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  Her daughter who works at the same nursing home, tells me that this morning she was sitting in the chair and started shaking and having seizure-like activity, fell from the chair to the floor, was foaming at the mouth and had blood coming out of her mouth, and her lips turned blue.  She recovered on her own, her initial vital signs evaluation her blood pressure was in the 539J systolic but then gradually improved.  She was also placed empirically on 2 L of oxygen.  Daughter tells me that when she had a prior stroke 7 years ago she recalls that patient has had seizures back then as well.  No other prior history of seizure DVT.  On my evaluation patient has underlying dementia cannot answer any of the questions.  ED Course: In the ED she is afebrile, normotensive, satting well on 2 L nasal  cannula.  Blood work has lactic acid of 2.9, slight white count of 12.0.  High-sensitivity troponin negative x2.  Urinalysis shows no evidence of a UTI.  She had a CT angiogram which was negative for PE. She was given Ceftriaxone and we are asked to admit  Interim: Patient remains confused, which is her baseline due to advanced dementia. She pulled out her IV and has refused re-insertion. Also has refused EEG. Neurology evaluated patient, no further work up necessary. Prescribed keppra for discharge. Patient was also treated for UTI with rocephin --> keflex. Urine culture is pending at time of discharge, please follow up. Blood culture showed 1 set of gram positive cocci and second set no growth. This likely represents contamination; she is non septic, no fevers, WBC normal and lactic acid normal.   Discharge Diagnoses:  Principal Problem:   Seizure ([place]) Active Problems:   Essential hypertension, benign   History of DVT (deep vein thrombosis)   GERD (gastroesophageal reflux disease)   UTI (urinary tract infection)   Vascular dementia with behavior disturbance (HCC)   Compression fracture of T9 vertebra [facility])   Discharge Instructions  Discharge Instructions    Diet general   Complete by: As directed    Discharge instructions   Complete by: As directed    Per [facility] statutes, patients with seizures are not allowed to drive untilthey have been seizure-free for six months. Use caution when using heavy equipment or power tools. Avoid working on ladders or at heights. Take showers instead of  baths. Ensure the water temperature is not too high on the home water heater. Do not go swimming alone. When caring for infants or small children, sit down when holding, feeding, or changing them to minimize risk of injury to the child in the event you have a seizure. Also, Maintain good sleep hygiene. Avoid alcohol.  If patienthas another seizure, call 911 and bring them back to the ED if: A.  The seizure lasts longer than 5 minutes.  B. The patient doesn't wake shortly after the seizure or has new problems such as difficulty seeing, speaking or moving following the seizure C. The patient was injured during the seizure D. The patient has a temperature over 102 F (39C) E. The patient vomited during the seizure and now is having trouble breathing    Increase activity slowly   Complete by: As directed      Allergies as of 07/26/2018      Reactions   Hydrocodone Nausea And Vomiting   Morphine And Related Nausea And Vomiting   Actonel [risedronate Sodium] Other (See Comments)   unknown   Darvon [propoxyphene Hcl] Other (See Comments)   unknown   Oxycodone Hcl Other (See Comments)   unknown   Pneumovax [pneumococcal Polysaccharide Vaccine] Other (See Comments)   unknown      Medication List    TAKE these medications   acetaminophen 325 MG tablet Commonly known as: TYLENOL Take 650 mg by mouth every 4 (four) hours as needed.   bisacodyl 10 MG suppository Commonly known as: DULCOLAX Place 10 mg rectally daily as needed for mild constipation or moderate constipation.   cephALEXin 500 MG capsule Commonly known as: KEFLEX Take 1 capsule (500 mg total) by mouth every 12 (twelve) hours for 7 days. Notes to patient: Today 07/26/2018 @ 10pm   GAS RELIEF 80 PO Take 80 mg by mouth 3 (three) times daily with meals. Notes to patient: Tomorrow 07/27/2018 @ 10am   hydrocortisone 2.5 % lotion Apply topically 2 (two) times daily as needed. Apply to itchy  area on face.   levETIRAcetam 500 MG tablet Commonly known as: KEPPRA Take 1 tablet (500 mg total) by mouth 2 (two) times daily. Notes to patient: Today 07/26/2018 @ 6pm   loratadine 10 MG tablet Commonly known as: CLARITIN Take 10 mg by mouth daily. Notes to patient: Tomorrow 07/27/2018 @ 10am   metoprolol tartrate 25 MG tablet Commonly known as: LOPRESSOR Take 12.5 mg by mouth 2 (two) times daily. Notes to  patient: Today 07/26/2018 @ 10pm   mirtazapine 15 MG tablet Commonly known as: REMERON Take 7.5 mg by mouth at bedtime. Notes to patient: Today 07/26/2018 @ 10pm   NON FORMULARY MAGIC CUP WITH LUNCH AND DINNER Notes to patient: Today 07/26/2018 @ 6pm   nystatin powder Generic drug: nystatin Apply topically daily as needed. Under left breast   omeprazole 10 MG capsule Commonly known as: PRILOSEC Take 10 mg by mouth daily. Notes to patient: Tomorrow 07/27/2018 @ 10am   polyethylene glycol 17 g packet Commonly known as: MIRALAX / GLYCOLAX Take 17 g by mouth daily. Notes to patient: Tomorrow 07/27/2018 @ 10am   risperiDONE 0.25 MG tablet Commonly known as: RISPERDAL Take 0.25 mg by mouth 2 (two) times daily. Notes to patient: Today 07/26/2018 @ 10pm   Rivaroxaban 15 MG Tabs tablet Commonly known as: XARELTO Take 1 tablet (15 mg total) by mouth daily at 6 PM. Notes to patient: Today 07/26/2018 @ 10pm   sertraline 50 MG  tablet Commonly known as: ZOLOFT Take 50 mg by mouth at bedtime. Take with 100 mg = 150 mg Notes to patient: Today 07/26/2018 @ 10pm   sertraline 100 MG tablet Commonly known as: ZOLOFT Take 100 mg by mouth at bedtime. Take with 50 mg tablet Notes to patient: Today 07/26/2018 @ 10pm       Contact information for follow-up providers    Mast, Man X, NP.   Specialty: Internal Medicine Contact information: [address] [address] [phone]            Contact information for after-discharge care    Destination    [facility] .   Service: Skilled Nursing Contact information: [place] [phone]                 Allergies  Allergen Reactions  . Hydrocodone Nausea And Vomiting  . Morphine And Related Nausea And Vomiting  . Actonel [Risedronate Sodium] Other (See Comments)    unknown  . Darvon [Propoxyphene Hcl] Other (See Comments)    unknown  . Oxycodone  Hcl Other (See Comments)    unknown  . Pneumovax [Pneumococcal Polysaccharide Vaccine] Other (See Comments)    unknown    Consultations:  Neurology    Procedures/Studies: Ct Head Wo Contrast  Result Date: 07/25/2018 CLINICAL DATA:  Possible seizure. EXAM: CT HEAD WITHOUT CONTRAST TECHNIQUE: Contiguous axial images were obtained from the base of the skull through the vertex without intravenous contrast. COMPARISON:  07/13/2016. FINDINGS: Brain: No significant change in mild-to-moderate enlargement of the ventricles and subarachnoid spaces. Stable mild patchy white matter low density in both cerebral hemispheres. Stable old left occipital and posterior watershed infarct. Stable right lateral hygroma measuring 15.5 mm in thickness with no significant mass effect. No intracranial hemorrhage, mass lesion or CT evidence of acute infarction. Vascular: No hyperdense vessel or unexpected calcification. Skull: Normal. Negative for fracture or focal lesion. Sinuses/Orbits: Status post bilateral cataract extraction. Small sphenoid sinus retention cyst. Other: None. IMPRESSION: 1. No acute abnormality. 2. Stable atrophy, chronic small vessel white matter ischemic changes and old infarcts, as described above. Electronically Signed   By: Claudie Revering M.D.   On: 07/25/2018 11:25   Ct Angio Chest Pe W Or Wo Contrast  Result Date: 07/25/2018 CLINICAL DATA:  Shortness of breath with elevated D-dimer. EXAM: CT ANGIOGRAPHY CHEST WITH CONTRAST TECHNIQUE: Multidetector CT imaging of the chest was performed using the standard protocol during bolus administration of intravenous contrast. Multiplanar CT image reconstructions and MIPs were obtained to evaluate the vascular anatomy. CONTRAST:  75mL OMNIPAQUE IOHEXOL 350 MG/ML SOLN COMPARISON:  10/11/2014. FINDINGS: Cardiovascular: Examination is limited by mild motion artifact.Given this limitation, no PE was identified. The main pulmonary artery is within normal limits for  size. There is no CT evidence of acute right heart strain. The visualized aorta is normal. Heart size is mildly enlarged. Mediastinum/Nodes: --No mediastinal or hilar lymphadenopathy. --No axillary lymphadenopathy. --No supraclavicular lymphadenopathy. --Normal thyroid gland. --The esophagus is unremarkable Lungs/Pleura: There is stable pleural thickening and calcification along the peripheral right upper lobe. There is atelectasis at the lung bases. There is no significant pleural effusion. No pneumothorax. The lung volumes are low. The trachea is unremarkable. Upper Abdomen: There is high-density material in the upper poles of both kidneys. This is favored to represent excretion of contrast as opposed to nonobstructing stones. There is moderate stenosis at the origin of the celiac axis with some poststenotic  dilatation of the proximal celiac trunk. Musculoskeletal: There is a sclerotic band through the T9 vertebral body which is new since prior study. There is no other acute appearing osseous abnormality. Review of the MIP images confirms the above findings. IMPRESSION: 1. No PE identified. 2. Low lung volumes with bibasilar atelectasis. 3. Age-indeterminate fracture of the T9 vertebral body. Correlation with physical exam and patient's symptoms is recommended. This is new since CT in 2018. Aortic Atherosclerosis (ICD10-I70.0). Electronically Signed   By: Constance Holster M.D.   On: 07/25/2018 15:39   Dg Chest Port 1 View  Result Date: 07/25/2018 CLINICAL DATA:  Altered behavior, possible seizure. EXAM: PORTABLE CHEST 1 VIEW COMPARISON:  Chest x-rays dated 07/13/2016 and 08/20/2014. FINDINGS: Stable cardiomegaly. Lungs are clear. No pleural effusion or pneumothorax seen. Osseous structures about the chest are unremarkable. IMPRESSION: No active disease. No evidence of pneumonia or pulmonary edema. Stable cardiomegaly. Electronically Signed   By: Franki Cabot M.D.   On: 07/25/2018 10:45         Discharge Exam: Vitals:   07/26/18 0625 07/26/18 1400  BP: (!) 125/57 (!) 143/65  Pulse: 67 75  Resp: 18   Temp: 98.7 F (37.1 C)   SpO2: 96%     General: Pt is alert, awake, not in acute distress, restless, confused  Cardiovascular: RRR, S1/S2 +, no rubs, no gallops Respiratory: CTA bilaterally, no wheezing, no rhonchi Abdominal: Soft, NT, ND, bowel sounds + Extremities: no edema, no cyanosis    The results of significant diagnostics from this hospitalization (including imaging, microbiology, ancillary and laboratory) are listed below for reference.     Microbiology: Recent Results (from the past 240 hour(s))  Culture, blood (Routine X 2) w Reflex to ID Panel     Status: None (Preliminary result)   Collection Time: 07/25/18 12:33 PM   Specimen: BLOOD  Result Value Ref Range Status   Specimen Description BLOOD SITE NOT SPECIFIED  Final   Special Requests   Final    BOTTLES DRAWN AEROBIC AND ANAEROBIC Blood Culture results may not be optimal due to an excessive volume of blood received in culture bottles   Culture  Setup Time   Final    GRAM POSITIVE COCCI IN CLUSTERS AEROBIC BOTTLE ONLY Organism ID to follow CRITICAL RESULT CALLED TO, READ BACK BY AND VERIFIED WITH: Hyman Bible PharmD 15:45 07/26/18 ([email or web address]) Performed at [facility], [address], [place], [place] [address]    Culture GRAM POSITIVE COCCI  Final   Report Status PENDING  Incomplete  Blood Culture ID Panel (Reflexed)     Status: Abnormal   Collection Time: 07/25/18 12:33 PM  Result Value Ref Range Status   Enterococcus species NOT DETECTED NOT DETECTED Final   Listeria monocytogenes NOT DETECTED NOT DETECTED Final   Staphylococcus species DETECTED (A) NOT DETECTED Final    Comment: Methicillin (oxacillin) susceptible coagulase negative staphylococcus. Possible blood culture contaminant (unless isolated from more than one blood culture draw or clinical case suggests pathogenicity). No antibiotic  treatment is indicated for blood  culture contaminants. CRITICAL RESULT CALLED TO, READ BACK BY AND VERIFIED WITH: Hyman Bible PharmD 15:45 07/26/18 ([email or web address])    Staphylococcus aureus (BCID) NOT DETECTED NOT DETECTED Final   Methicillin resistance NOT DETECTED NOT DETECTED Final   Streptococcus species NOT DETECTED NOT DETECTED Final   Streptococcus agalactiae NOT DETECTED NOT DETECTED Final   Streptococcus pneumoniae NOT DETECTED NOT DETECTED Final   Streptococcus pyogenes NOT DETECTED NOT DETECTED Final  Acinetobacter baumannii NOT DETECTED NOT DETECTED Final   Enterobacteriaceae species NOT DETECTED NOT DETECTED Final   Enterobacter cloacae complex NOT DETECTED NOT DETECTED Final   Escherichia coli NOT DETECTED NOT DETECTED Final   Klebsiella oxytoca NOT DETECTED NOT DETECTED Final   Klebsiella pneumoniae NOT DETECTED NOT DETECTED Final   Proteus species NOT DETECTED NOT DETECTED Final   Serratia marcescens NOT DETECTED NOT DETECTED Final   Haemophilus influenzae NOT DETECTED NOT DETECTED Final   Neisseria meningitidis NOT DETECTED NOT DETECTED Final   Pseudomonas aeruginosa NOT DETECTED NOT DETECTED Final   Candida albicans NOT DETECTED NOT DETECTED Final   Candida glabrata NOT DETECTED NOT DETECTED Final   Candida krusei NOT DETECTED NOT DETECTED Final   Candida parapsilosis NOT DETECTED NOT DETECTED Final   Candida tropicalis NOT DETECTED NOT DETECTED Final    Comment: Performed at [facility], [place] [address] [place], [place] [address]  Culture, blood (Routine X 2) w Reflex to ID Panel     Status: None (Preliminary result)   Collection Time: 07/25/18 12:38 PM   Specimen: BLOOD  Result Value Ref Range Status   Specimen Description BLOOD SITE NOT SPECIFIED  Final   Special Requests   Final    BOTTLES DRAWN AEROBIC AND ANAEROBIC Blood Culture results may not be optimal due to an excessive volume of blood received in culture bottles   Culture   Final    NO GROWTH < 24  HOURS Performed at [facility], [place] [address] [place], [place] [address]    Report Status PENDING  Incomplete  SARS Coronavirus 2 (CEPHEID - Performed in [facility] hospital lab), Hosp Order     Status: None   Collection Time: 07/25/18  3:52 PM   Specimen: Nasopharyngeal Swab  Result Value Ref Range Status   SARS Coronavirus 2 NEGATIVE NEGATIVE Final    Comment: (NOTE) If result is NEGATIVE SARS-CoV-2 target nucleic acids are NOT DETECTED. The SARS-CoV-2 RNA is generally detectable in upper and lower  respiratory specimens during the acute phase of infection. The lowest  concentration of SARS-CoV-2 viral copies this assay can detect is 250  copies / mL. A negative result does not preclude SARS-CoV-2 infection  and should not be used as the sole basis for treatment or other  patient management decisions.  A negative result may occur with  improper specimen collection / handling, submission of specimen other  than nasopharyngeal swab, presence of viral mutation(s) within the  areas targeted by this assay, and inadequate number of viral copies  (<250 copies / mL). A negative result must be combined with clinical  observations, patient history, and epidemiological information. If result is POSITIVE SARS-CoV-2 target nucleic acids are DETECTED. The SARS-CoV-2 RNA is generally detectable in upper and lower  respiratory specimens dur ing the acute phase of infection.  Positive  results are indicative of active infection with SARS-CoV-2.  Clinical  correlation with patient history and other diagnostic information is  necessary to determine patient infection status.  Positive results do  not rule out bacterial infection or co-infection with other viruses. If result is PRESUMPTIVE POSTIVE SARS-CoV-2 nucleic acids MAY BE PRESENT.   A presumptive positive result was obtained on the submitted specimen  and confirmed on repeat testing.  While 2019 novel coronavirus  (SARS-CoV-2)  nucleic acids may be present in the submitted sample  additional confirmatory testing may be necessary for epidemiological  and / or clinical management purposes  to differentiate  between  SARS-CoV-2 and other Sarbecovirus currently known to infect humans.  If clinically indicated additional testing with an alternate test  methodology [phone]) is advised. The SARS-CoV-2 RNA is generally  detectable in upper and lower respiratory sp ecimens during the acute  phase of infection. The expected result is Negative. Fact Sheet for Patients:  StrictlyIdeas.no Fact Sheet for Healthcare Providers: BankingDealers.co.za This test is not yet approved or cleared by the [place] FDA and has been authorized for detection and/or diagnosis of SARS-CoV-2 by FDA under an Emergency Use Authorization (EUA).  This EUA will remain in effect (meaning this test can be used) for the duration of the COVID-19 declaration under Section 564(b)(1) of the Act, 21 U.S.C. section 360bbb-3(b)(1), unless the authorization is terminated or revoked sooner. Performed at [facility], [place] [address], [place], [place] [address]      Labs: BNP (last 3 results) No results for input(s): BNP in the last 8760 hours. Basic Metabolic Panel: Recent Labs  Lab 07/25/18 1032 07/26/18 0331  NA 141 143  K 4.1 3.7  CL 110 113*  CO2 21* 22  GLUCOSE 108* 110*  BUN 9 7*  CREATININE 0.99 1.15*  CALCIUM 8.9 8.3*   Liver Function Tests: Recent Labs  Lab 07/25/18 1032 07/26/18 0331  AST 22 26  ALT 21 19  ALKPHOS 88 82  BILITOT 0.4 0.5  PROT 6.0* 5.7*  ALBUMIN 3.4* 3.2*   Recent Labs  Lab 07/25/18 1032  LIPASE 34   No results for input(s): AMMONIA in the last 168 hours. CBC: Recent Labs  Lab 07/25/18 1032 07/26/18 0331  WBC 12.0* 7.7  HGB 15.0 13.2  HCT 47.5* 41.4  MCV 96.5 95.6  PLT 220 214   Cardiac Enzymes: No results for input(s): CKTOTAL, CKMB,  CKMBINDEX, TROPONINI in the last 168 hours. BNP: Invalid input(s): POCBNP CBG: No results for input(s): GLUCAP in the last 168 hours. D-Dimer Recent Labs    07/25/18 1032  DDIMER 3.49*   Hgb A1c No results for input(s): HGBA1C in the last 72 hours. Lipid Profile No results for input(s): CHOL, HDL, LDLCALC, TRIG, CHOLHDL, LDLDIRECT in the last 72 hours. Thyroid function studies No results for input(s): TSH, T4TOTAL, T3FREE, THYROIDAB in the last 72 hours.  Invalid input(s): FREET3 Anemia work up No results for input(s): VITAMINB12, FOLATE, FERRITIN, TIBC, IRON, RETICCTPCT in the last 72 hours. Urinalysis    Component Value Date/Time   COLORURINE YELLOW 07/25/2018 1211   APPEARANCEUR HAZY (A) 07/25/2018 1211   LABSPEC 1.017 07/25/2018 1211   PHURINE 6.0 07/25/2018 1211   GLUCOSEU NEGATIVE 07/25/2018 1211   HGBUR SMALL (A) 07/25/2018 1211   BILIRUBINUR NEGATIVE 07/25/2018 1211   KETONESUR NEGATIVE 07/25/2018 1211   PROTEINUR NEGATIVE 07/25/2018 1211   NITRITE POSITIVE (A) 07/25/2018 1211   LEUKOCYTESUR NEGATIVE 07/25/2018 1211   Sepsis Labs Invalid input(s): PROCALCITONIN,  WBC,  LACTICIDVEN Microbiology Recent Results (from the past 240 hour(s))  Culture, blood (Routine X 2) w Reflex to ID Panel     Status: None (Preliminary result)   Collection Time: 07/25/18 12:33 PM   Specimen: BLOOD  Result Value Ref Range Status   Specimen Description BLOOD SITE NOT SPECIFIED  Final   Special Requests   Final    BOTTLES DRAWN AEROBIC AND ANAEROBIC Blood Culture results may not be optimal due to an excessive volume of blood received in culture bottles   Culture  Setup Time   Final    GRAM POSITIVE COCCI  IN CLUSTERS AEROBIC BOTTLE ONLY Organism ID to follow CRITICAL RESULT CALLED TO, READ BACK BY AND VERIFIED WITH: Hyman Bible PharmD 15:45 07/26/18 ([email or web address]) Performed at [facility], [place] [address], [place], [place] [address]    Culture GRAM POSITIVE COCCI  Final   Report  Status PENDING  Incomplete  Blood Culture ID Panel (Reflexed)     Status: Abnormal   Collection Time: 07/25/18 12:33 PM  Result Value Ref Range Status   Enterococcus species NOT DETECTED NOT DETECTED Final   Listeria monocytogenes NOT DETECTED NOT DETECTED Final   Staphylococcus species DETECTED (A) NOT DETECTED Final    Comment: Methicillin (oxacillin) susceptible coagulase negative staphylococcus. Possible blood culture contaminant (unless isolated from more than one blood culture draw or clinical case suggests pathogenicity). No antibiotic treatment is indicated for blood  culture contaminants. CRITICAL RESULT CALLED TO, READ BACK BY AND VERIFIED WITH: Hyman Bible PharmD 15:45 07/26/18 ([email or web address])    Staphylococcus aureus (BCID) NOT DETECTED NOT DETECTED Final   Methicillin resistance NOT DETECTED NOT DETECTED Final   Streptococcus species NOT DETECTED NOT DETECTED Final   Streptococcus agalactiae NOT DETECTED NOT DETECTED Final   Streptococcus pneumoniae NOT DETECTED NOT DETECTED Final   Streptococcus pyogenes NOT DETECTED NOT DETECTED Final   Acinetobacter baumannii NOT DETECTED NOT DETECTED Final   Enterobacteriaceae species NOT DETECTED NOT DETECTED Final   Enterobacter cloacae complex NOT DETECTED NOT DETECTED Final   Escherichia coli NOT DETECTED NOT DETECTED Final   Klebsiella oxytoca NOT DETECTED NOT DETECTED Final   Klebsiella pneumoniae NOT DETECTED NOT DETECTED Final   Proteus species NOT DETECTED NOT DETECTED Final   Serratia marcescens NOT DETECTED NOT DETECTED Final   Haemophilus influenzae NOT DETECTED NOT DETECTED Final   Neisseria meningitidis NOT DETECTED NOT DETECTED Final   Pseudomonas aeruginosa NOT DETECTED NOT DETECTED Final   Candida albicans NOT DETECTED NOT DETECTED Final   Candida glabrata NOT DETECTED NOT DETECTED Final   Candida krusei NOT DETECTED NOT DETECTED Final   Candida parapsilosis NOT DETECTED NOT DETECTED Final   Candida tropicalis NOT DETECTED  NOT DETECTED Final    Comment: Performed at [facility], [address], [place], [place] [address]  Culture, blood (Routine X 2) w Reflex to ID Panel     Status: None (Preliminary result)   Collection Time: 07/25/18 12:38 PM   Specimen: BLOOD  Result Value Ref Range Status   Specimen Description BLOOD SITE NOT SPECIFIED  Final   Special Requests   Final    BOTTLES DRAWN AEROBIC AND ANAEROBIC Blood Culture results may not be optimal due to an excessive volume of blood received in culture bottles   Culture   Final    NO GROWTH < 24 HOURS Performed at [facility], [place] [address], [place], [place] [address]    Report Status PENDING  Incomplete  SARS Coronavirus 2 (CEPHEID - Performed in [facility] lab), Hosp Order     Status: None   Collection Time: 07/25/18  3:52 PM   Specimen: Nasopharyngeal Swab  Result Value Ref Range Status   SARS Coronavirus 2 NEGATIVE NEGATIVE Final    Comment: (NOTE) If result is NEGATIVE SARS-CoV-2 target nucleic acids are NOT DETECTED. The SARS-CoV-2 RNA is generally detectable in upper and lower  respiratory specimens during the acute phase of infection. The lowest  concentration of SARS-CoV-2 viral copies this assay can detect is 250  copies / mL. A negative result does not preclude  SARS-CoV-2 infection  and should not be used as the sole basis for treatment or other  patient management decisions.  A negative result may occur with  improper specimen collection / handling, submission of specimen other  than nasopharyngeal swab, presence of viral mutation(s) within the  areas targeted by this assay, and inadequate number of viral copies  (<250 copies / mL). A negative result must be combined with clinical  observations, patient history, and epidemiological information. If result is POSITIVE SARS-CoV-2 target nucleic acids are DETECTED. The SARS-CoV-2 RNA is generally detectable in upper and lower  respiratory specimens dur ing the  acute phase of infection.  Positive  results are indicative of active infection with SARS-CoV-2.  Clinical  correlation with patient history and other diagnostic information is  necessary to determine patient infection status.  Positive results do  not rule out bacterial infection or co-infection with other viruses. If result is PRESUMPTIVE POSTIVE SARS-CoV-2 nucleic acids MAY BE PRESENT.   A presumptive positive result was obtained on the submitted specimen  and confirmed on repeat testing.  While 2019 novel coronavirus  (SARS-CoV-2) nucleic acids may be present in the submitted sample  additional confirmatory testing may be necessary for epidemiological  and / or clinical management purposes  to differentiate between  SARS-CoV-2 and other Sarbecovirus currently known to infect humans.  If clinically indicated additional testing with an alternate test  methodology [phone]) is advised. The SARS-CoV-2 RNA is generally  detectable in upper and lower respiratory sp ecimens during the acute  phase of infection. The expected result is Negative. Fact Sheet for Patients:  StrictlyIdeas.no Fact Sheet for Healthcare Providers: BankingDealers.co.za This test is not yet approved or cleared by the [place] FDA and has been authorized for detection and/or diagnosis of SARS-CoV-2 by FDA under an Emergency Use Authorization (EUA).  This EUA will remain in effect (meaning this test can be used) for the duration of the COVID-19 declaration under Section 564(b)(1) of the Act, 21 U.S.C. section 360bbb-3(b)(1), unless the authorization is terminated or revoked sooner. Performed at [facility], [place] [address] [place], [place] [address]        Patient was seen and examined on the day of discharge and was found to be in stable condition. Time coordinating discharge: 35 minutes including assessment and coordination of care, as well as  examination of the patient.   SIGNED:  Dessa Phi, DO [facility] [email or web address] 07/26/2018, 3:57 PM

## 2018-07-26 NOTE — Progress Notes (Signed)
Report given to receiving nurse Lauralyn Primes at [facility].

## 2018-07-26 NOTE — TOC Transition Note (Signed)
Transition of Care [facility]) - CM/SW Discharge Note   Patient Details  Name: Guynell Kleiber MRN: [id number] Date of Birth: 1927/05/01  Transition of Care [facility]) CM/SW Contact:  Benard Halsted, LCSW Phone Number: 07/26/2018, 4:21 PM   Clinical Narrative:    Patient will DC to: [facility] Anticipated DC date: 07/26/18 Family notified: Daughter at bedside Transport by: Corey Harold   Per MD patient ready for DC to [facility]. RN, patient, patient's family, and facility notified of DC. Discharge Summary and FL2 sent to facility. RN to call report prior to discharge [phone]). DC packet on chart. Ambulance transport requested for patient.   CSW will sign off for now as social work intervention is no longer needed. Please consult [place] again if new needs arise.  Cedric Fishman, LCSW Clinical Social Worker [phone]    Final next level of care: Skilled Nursing Facility Barriers to Discharge: No Barriers Identified   Patient Goals and CMS Choice Patient states their goals for this hospitalization and ongoing recovery are:: Return to SNF CMS Medicare.gov Compare Post Acute Care list provided to:: Patient Represenative (must comment)(NA-From SNF) Choice offered to / list presented to : NA  Discharge Placement   Existing PASRR number confirmed : 07/26/18          Patient chooses bed at: [facility] Patient to be transferred to facility by: [facility] Name of family member notified: Daughter Patient and family notified of of transfer: 07/26/18  Discharge Plan and Services In-house Referral: Clinical Social Work Discharge Planning Services: NA Post Acute Care Choice: [facility]          DME Arranged: N/A         HH Arranged: NA          Social Determinants of Health (SDOH) Interventions     Readmission Risk Interventions No flowsheet data found.

## 2018-07-26 NOTE — Progress Notes (Signed)
Spoke with both daughters Daine [place] and [place]. Asked [name] if she could come sit with pt to help with her dementia. Pt also spoke on the phone with [place].

## 2018-07-26 NOTE — Progress Notes (Addendum)
VAST RN consulted to place IV access. Upon entry into room, pt with garbled speech. Attempted to start IV after explaining procedure to patient, and she jerked arm away. Pt with visible and palpable veins.Educated further that IV was needed for patient to receive meds to help her, and she stated 'NO". Unit RN notified.

## 2018-07-26 NOTE — Progress Notes (Addendum)
NEUROLOGY PROGRESS NOTE  Subjective: Patient remains extremely receptively a phasic.  Daughter is in the room attempting to help however patient does not understand any questions and or commands.  No seizures noted overnight.  Per daughter patient refused EEG most likely due to not understanding why or what was going on. Exam: Vitals:   07/25/18 2156 07/26/18 0625  BP: 127/67 (!) 125/57  Pulse: 73 67  Resp: 18 18  Temp: 97.7 F (36.5 C) 98.7 F (37.1 C)  SpO2: 97% 96%    Physical Exam   HEENT-  Normocephalic, no lesions, without obvious abnormality.  Normal external eye and conjunctiva.   Extremities- Warm, dry and intact Musculoskeletal-no joint tenderness, deformity or swelling Skin-warm and dry, no hyperpigmentation, vitiligo, or suspicious lesions    Neuro:  Mental Status: Patient is alert, does not follow commands even with help by daughter.  Does not understand questions.  Is agitated if attempting to do any form of exam Cranial Nerves: II: Blinks to threat on the left but not the right.  As stated in previous note she does fixate on examiner III,IV, VI: ptosis not present, extra-ocular motions intact bilaterally pupils equal, round, reactive to light and accommodation V,VII: smile symmetric, facial light touch sensation normal bilaterally VIII: hearing normal bilaterally  Motor: Moving all extremities antigravity with left greater than right Sensory: Withdraws to noxious stimuli Deep Tendon Reflexes: 2+ and symmetric throughout Plantars: Right: downgoing   Left: downgoing   Medications:  Prior to Admission:  Medications Prior to Admission  Medication Sig Dispense Refill Last Dose  . acetaminophen (TYLENOL) 325 MG tablet Take 650 mg by mouth every 4 (four) hours as needed.   unk  . bisacodyl (DULCOLAX) 10 MG suppository Place 10 mg rectally daily as needed for mild constipation or moderate constipation.   unk  . hydrocortisone 2.5 % lotion Apply topically 2 (two)  times daily as needed. Apply to itchy  area on face.   unk  . loratadine (CLARITIN) 10 MG tablet Take 10 mg by mouth daily.   07/24/2018 at Unknown time  . metoprolol tartrate (LOPRESSOR) 25 MG tablet Take 12.5 mg by mouth 2 (two) times daily.    07/24/2018 at 1900  . mirtazapine (REMERON) 15 MG tablet Take 7.5 mg by mouth at bedtime.    07/24/2018 at Unknown time  . nystatin (NYSTATIN) powder Apply topically daily as needed. Under left breast    unk  . omeprazole (PRILOSEC) 10 MG capsule Take 10 mg by mouth daily.   07/24/2018 at Unknown time  . polyethylene glycol (MIRALAX / GLYCOLAX) packet Take 17 g by mouth daily.   07/24/2018 at Unknown time  . risperiDONE (RISPERDAL) 0.25 MG tablet Take 0.25 mg by mouth 2 (two) times daily.   07/24/2018 at Unknown time  . Rivaroxaban (XARELTO) 15 MG TABS tablet Take 1 tablet (15 mg total) by mouth daily at 6 PM. 42 tablet  07/24/2018 at 1641  . sertraline (ZOLOFT) 100 MG tablet Take 100 mg by mouth at bedtime. Take with 50 mg tablet   07/24/2018 at Unknown time  . sertraline (ZOLOFT) 50 MG tablet Take 50 mg by mouth at bedtime. Take with 100 mg = 150 mg    07/24/2018 at Unknown time  . Simethicone (GAS RELIEF 80 PO) Take 80 mg by mouth 3 (three) times daily with meals.    07/24/2018 at Unknown time  . NON FORMULARY MAGIC CUP WITH LUNCH AND DINNER      Scheduled: .  levETIRAcetam  500 mg Oral BID  . loratadine  10 mg Oral Daily  . metoprolol tartrate  12.5 mg Oral BID  . mirtazapine  7.5 mg Oral QHS  . pantoprazole  40 mg Oral Daily  . polyethylene glycol  17 g Oral Daily  . risperiDONE  0.25 mg Oral BID  . Rivaroxaban  15 mg Oral q1800  . sertraline  150 mg Oral QHS    Pertinent Labs/Diagnostics:   Ct Head Wo Contrast  Result Date: 07/25/2018 CLINICAL DATA:  Possible seizure. EXAM: CT HEAD WITHOUT CONTRAST TECHNIQUE:. IMPRESSION: 1. No acute abnormality. 2. Stable atrophy, chronic small vessel white matter ischemic changes and old infarcts, as described  above. Electronically Signed   By: Claudie Revering M.D.   On: 07/25/2018 11:25   Ct Angio Chest Pe W Or Wo Contrast  Result Date: 07/25/2018 CLINICAL DATA:  Shortness of breath with elevated D-dimer. IMPRESSION: 1. No PE identified. 2. Low lung volumes with bibasilar atelectasis. 3. Age-indeterminate fracture of the T9 vertebral body. Correlation with physical exam and patient's symptoms is recommended. This is new since CT in 2018. Aortic Atherosclerosis (ICD10-I70.0). Electronically Signed   By: Constance Holster M.D.   On: 07/25/2018 15:39   Dg Chest Port 1 View  Result Date: 07/25/2018 CLINICAL DATA:  Altered behavior, possible seizure. EXAM: PORTABLE CHEST 1 VIEW COMPARISON:  Chest x-rays dated 07/13/2016 and 08/20/2014. FINDINGS: Stable cardiomegaly. Lungs are clear. No pleural effusion or pneumothorax seen. Osseous structures about the chest are unremarkable. IMPRESSION: No active disease. No evidence of pneumonia or pulmonary edema. Stable cardiomegaly. Electronically Signed   By: Franki Cabot M.D.   On: 07/25/2018 10:45     Etta Quill PA-C [facility] [phone]   Assessment:  85 year old female presenting with shaking spells.  Patient was loaded with Keppra 1000 mg and started on maintenance dose of 500 mg twice daily.  Currently no further shaking episodes and handling Keppra well.  Unfortunately exam was very difficult as patient has receptive aphasia.    Recommendations: -Obtain EEG if possible -Continue Keppra 500 mg twice daily -Seizure precautions -Treat UTI -Per [facility] statutes, patients with seizures are not allowed to drive until  they have been seizure-free for six months. Use caution when using heavy equipment or power tools. Avoid working on ladders or at heights. Take showers instead of baths. Ensure the water temperature is not too high on the home water heater. Do not go swimming alone. When caring for infants or small children, sit down  when holding, feeding, or changing them to minimize risk of injury to the child in the event you have a seizure.   Also, Maintain good sleep hygiene. Avoid alcohol. 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   I have reviewed the contents of history and physical exam as documented by PA/ARNP/Resident and agree with above documentation.  I have discussed and formulated the above plan as documented. Edits to the note have been made as needed.  Patient presented with seizure.  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  Patient was started on Keppra 500 mg twice daily per daughter appears to be a little bit more agitated with this medication.  Recommended reducing dose of 250 twice daily if agitation does not improve in a couple of days.  Will need outpatient follow-up with neurology    Karena Addison Xochil Shanker MD [facility] [phone]   If 7pm to 7am, please call on call as listed on AMION.

## 2018-07-26 NOTE — NC FL2 (Signed)
[place] MEDICAID FL2 LEVEL OF CARE SCREENING TOOL     IDENTIFICATION  Patient Name: Erica [name] Birthdate: [birth year] Sex: female Admission Date (Current Location): 07/25/2018  [facility] and [place] Number:  Herbalist and Address:  The . [facility], [place] [address] [place], [place] [address]      Provider Number: [id number]  Attending Physician Name and Address:  Dessa Phi, DO  Relative Name and Phone Number:  [name], daughter, [phone]    Current Level of Care: Hospital Recommended Level of Care: [place] Prior Approval Number:    Date Approved/Denied:   PASRR Number: [id number]  Discharge Plan: SNF    Current Diagnoses: Patient Active Problem List   Diagnosis Date Noted  . Seizure ([place]) 07/25/2018  . Adult failure to thrive 06/11/2017  . Advance care planning 03/19/2017  . Gait abnormality 12/08/2016  . History of CVA (cerebrovascular accident) 10/17/2016  . Psychosis ([place]) 08/21/2016  . Hallucination 08/14/2016  . Dysarthria following cerebrovascular accident 07/22/2016  . Depression due to dementia ([place]) 07/22/2016  . Vascular dementia with behavior disturbance ([place]) 07/22/2016  . Hydroureteronephrosis   . Stroke ([place])   . Dysphagia following cerebrovascular disease   . 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 Nontraumatic intracranial hemorrhage ([place])   . 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   . Neuralgia and neuritis, unspecified (CODE)   . Major depressive disorder, recurrent ([place])   . Chronic constipation 01/29/2015  . Mass of left breast on mammogram 12/15/2013  . Sinusitis, chronic 11/30/2013  . Solitary bone cyst of left forearm 11/10/2013  . SCC (squamous cell carcinoma), scalp/neck 11/10/2013  . GERD (gastroesophageal reflux disease) 09/29/2012  . Protein C deficiency ([place]) 06/14/2012  . Protein S deficiency ([place]) 06/14/2012  . DNR (do not resuscitate)  06/14/2012  . 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 Essential hypertension, benign 03/24/2012  . History of DVT (deep vein thrombosis) 03/24/2012  . Dysphagia 03/24/2012  . CVA (cerebral vascular accident) ([place]) 03/24/2012  . Osteoporosis 03/24/2012  . Macular degeneration 03/24/2012  . Aphasia 03/24/2012  . Dyslipidemia 03/24/2012    Orientation RESPIRATION BLADDER Height & Weight     (Disoriented x4)  Normal Continent Weight: 156 lb (70.8 kg) Height:     BEHAVIORAL SYMPTOMS/MOOD NEUROLOGICAL BOWEL NUTRITION STATUS  (Dementia)   Continent Diet(Please see DC Summary)  AMBULATORY STATUS COMMUNICATION OF NEEDS Skin   Extensive Assist Verbally Normal                       Personal Care Assistance Level of Assistance  Bathing, Feeding, Dressing Bathing Assistance: Maximum assistance Feeding assistance: Maximum assistance Dressing Assistance: Maximum assistance     Functional Limitations Info  Sight, Hearing, Speech Sight Info: Adequate Hearing Info: Adequate Speech Info: Adequate    SPECIAL CARE FACTORS FREQUENCY                       Contractures Contractures Info: Not present    Additional Factors Info  Code Status, Allergies Code Status Info: DNR Allergies Info: Hydrocodone, Morphine And Related, Actonel (Risedronate Sodium), Darvon (Propoxyphene Hcl), Oxycodone Hcl, Pneumovax (Pneumococcal Polysaccharide Vaccine)           Current Medications (07/26/2018):  This is the current hospital active medication list Current Facility-Administered Medications  Medication Dose Route Frequency Provider Last Rate Last Dose  . acetaminophen (TYLENOL) tablet 650 mg  650 mg Oral  Q4H PRN Caren Griffins, MD   650 mg at 07/26/18 3976  . bisacodyl (DULCOLAX) suppository 10 mg  10 mg Rectal Daily PRN Caren Griffins, MD      . cephALEXin (KEFLEX) capsule 500 mg  500 mg Oral Q12H Dessa Phi, DO   500 mg at 07/26/18 1045  . haloperidol (HALDOL) tablet 5 mg  5 mg  Oral Q8H PRN Dessa Phi, DO       Or  . haloperidol lactate (HALDOL) injection 5 mg  5 mg Intramuscular Q8H PRN Dessa Phi, DO   5 mg at 07/26/18 7341  . levETIRAcetam (KEPPRA) tablet 500 mg  500 mg Oral BID Dessa Phi, DO   500 mg at 07/26/18 1039  . loratadine (CLARITIN) tablet 10 mg  10 mg Oral Daily Caren Griffins, MD   10 mg at 07/26/18 1037  . metoprolol tartrate (LOPRESSOR) tablet 12.5 mg  12.5 mg Oral BID Caren Griffins, MD   12.5 mg at 07/26/18 1035  . mirtazapine (REMERON) tablet 7.5 mg  7.5 mg Oral QHS Caren Griffins, MD   7.5 mg at 07/25/18 2232  . pantoprazole (PROTONIX) EC tablet 40 mg  40 mg Oral Daily Caren Griffins, MD   40 mg at 07/26/18 1035  . polyethylene glycol (MIRALAX / GLYCOLAX) packet 17 g  17 g Oral Daily Caren Griffins, MD   17 g at 07/26/18 1040  . risperiDONE (RISPERDAL) tablet 0.25 mg  0.25 mg Oral BID Caren Griffins, MD   0.25 mg at 07/26/18 1035  . Rivaroxaban (XARELTO) tablet 15 mg  15 mg Oral q1800 Caren Griffins, MD      . sertraline (ZOLOFT) tablet 150 mg  150 mg Oral QHS Caren Griffins, MD   150 mg at 07/25/18 2230     Discharge Medications: Please see discharge summary for a list of discharge medications.  Relevant Imaging Results:  Relevant Lab Results:   Additional Information ss#[id number]   COVID negative on 7/19  Lissa Morales Cedric Denison, LCSW

## 2018-07-26 NOTE — Evaluation (Addendum)
Clinical/Bedside Swallow Evaluation Patient Details  Name: Erica [name] MRN: [id number] Date of Birth: 1927-03-12  Today's Date: 07/26/2018 Time: SLP Start Time (ACUTE ONLY): 0820 SLP Stop Time (ACUTE ONLY): 0834 SLP Time Calculation (min) (ACUTE ONLY): 14 min  Past Medical History:  Past Medical History:  Diagnosis Date  . Acute encephalopathy   . Aphasia following unspecified cerebrovascular disease   . 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 Depression   . Dysphagia following cerebrovascular disease   . Hepatic steatosis   . Hydroureteronephrosis   . Hypertension   . Insomnia   . Macular degeneration   . Major depressive disorder, recurrent ([place])   . Neuralgia and neuritis, unspecified (CODE)   . Nontraumatic intracranial hemorrhage ([place])   . Stroke ([place])   . 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   . 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 ABDOMINAL HYSTERECTOMY    . APPENDECTOMY    . BREAST SURGERY    . MASTECTOMY Right    HPI:  Erica [name] is an 85 y.o. female with receptive aphasia secondary to prior stroke, prior non-traumatic ICH, HTN and vascular dementia who presents form her SNF after having a shaking spell. Daughter was unsure if this was due to a seizure and there was apparently no post-ictal period. She has had a seizure in the past in association with a stroke, but was not on medications for this at her SNF. She was loaded with Keppra after initial assessment this admission. No prior SLP notes.  Daughter confirms stroke did not affect her eating and drinking.   Assessment / Plan / Recommendation Clinical Impression  Pt is alert, restless and presents with severe aphasia with expressive and receptive deficits. There is no observable oral motor weakness or signs of oral dysphagia. With assistance she is able to  consume all textures without difficulty other than being distractible. Pt may initiate a regular diet with thin liquids but will need full supervision with meals. No SLP f/u needed acutely, pt may resume any SLP tx for language at next level of care.  SLP Visit Diagnosis: Dysphagia, unspecified (R13.10)    Aspiration Risk  Mild aspiration risk    Diet Recommendation Regular;Thin liquid   Liquid Administration via: Straw;Cup Medication Administration: Whole meds with liquid Supervision: Staff to assist with self feeding;Full supervision/cueing for compensatory strategies Compensations: Minimize environmental distractions Postural Changes: Seated upright at 90 degrees    Other  Recommendations     Follow up Recommendations None      Frequency and Duration            Prognosis        Swallow Study   General HPI: Erica [name] is an 85 y.o. female with receptive aphasia secondary to prior stroke, prior non-traumatic ICH, HTN and vascular dementia who presents form her SNF after having a shaking spell. Daughter was unsure if this was due to a seizure and there was apparently no post-ictal period. She has had a seizure in the past in association with a stroke, but was not on medications for this at her SNF. She was loaded with Keppra after initial assessment this admission. No prior SLP notes.  Type of Study: Bedside Swallow Evaluation Diet Prior to this Study: NPO Temperature Spikes Noted: No Respiratory Status: Room air History of Recent Intubation: No Behavior/Cognition: Alert;Distractible;Doesn't  follow directions Oral Cavity Assessment: Within Functional Limits Oral Care Completed by SLP: No Oral Cavity - Dentition: Adequate natural dentition Vision: Functional for self-feeding Self-Feeding Abilities: Needs assist Patient Positioning: Upright in bed Baseline Vocal Quality: Normal Volitional Cough: Cognitively unable to elicit Volitional Swallow: Unable to elicit     Oral/Motor/Sensory Function Overall Oral Motor/Sensory Function: Other (comment)(appears WNL)   Ice Chips     Thin Liquid Thin Liquid: Within functional limits Presentation: Straw;Cup;Self Fed    Nectar Thick Nectar Thick Liquid: Not tested   Honey Thick Honey Thick Liquid: Not tested   Puree Puree: Within functional limits Presentation: Spoon   Solid     Solid: Within functional limits Presentation: [place] Erica Radabaugh, MA [place] Pager [phone] Office [phone]  Erica [name], Erica [name] 07/26/2018,8:40 AM

## 2018-07-27 ENCOUNTER — Encounter: Payer: Self-pay | Admitting: Internal Medicine

## 2018-07-27 ENCOUNTER — Non-Acute Institutional Stay (SKILLED_NURSING_FACILITY): Payer: Medicare Other | Admitting: Internal Medicine

## 2018-07-27 DIAGNOSIS — R569 Unspecified convulsions: Secondary | ICD-10-CM | POA: Diagnosis not present

## 2018-07-27 DIAGNOSIS — I10 Essential (primary) hypertension: Secondary | ICD-10-CM

## 2018-07-27 DIAGNOSIS — Z8673 Personal history of transient ischemic attack (TIA), and cerebral infarction without residual deficits: Secondary | ICD-10-CM

## 2018-07-27 DIAGNOSIS — I8291 Chronic embolism and thrombosis of unspecified vein: Secondary | ICD-10-CM | POA: Diagnosis not present

## 2018-07-27 LAB — URINE CULTURE: Culture: NO GROWTH

## 2018-07-27 LAB — CULTURE, BLOOD (ROUTINE X 2)

## 2018-07-27 NOTE — Progress Notes (Signed)
Provider:Jeannetta Cerutti L.MD    Location:  [facility] Room Number: [id number] UKGUR of Service:  SNF (31)  PCP: Mast, Man X, NP Patient Care Team: Mast, Man X, NP as PCP - General (Internal Medicine) Mast, Man X, NP as Nurse Practitioner (Internal Medicine) Dunn, Warnell Bureau as Consulting Physician (Optometry) Monna Fam, MD as Consulting Physician (Ophthalmology) Lucky Cowboy, Gordy Clement, NP as Nurse Practitioner (Hospice and Palliative Medicine) Virgie Dad, MD as Consulting Physician (Internal Medicine)  Extended Emergency Contact Information Primary Emergency Contact: [facility] Address: [address], [place] [address] [name] of [place] Phone: [phone] Work Phone: [phone] Mobile Phone: [phone] Relation: Daughter  Code Status:DNR Goals of Care: Advanced Directive information Advanced Directives 07/27/2018  Does Patient Have a Medical Advance Directive? Yes  Type of Paramedic of [place];Out of facility DNR (pink MOST or yellow form)  Does patient want to make changes to medical advance directive? No - Patient declined  Copy of [place] in Chart? Yes - validated most recent copy scanned in chart (See row information)  Pre-existing out of facility DNR order (yellow form or pink MOST form) Yellow form placed in chart (order not valid for inpatient use)      Chief Complaint  Patient presents with  . Readmit To SNF    readmit to facility, Blood sugar as of 7/19 111mg /dl    HPI: Patient is a 85 y.o. female seen today for Readmission to SNF for Supportive Care Patient was in the hospital for 24 hours for seizures new onset  Patient has a history of CVA, vascular dementia with behavior issues, hypertension history of DVT on chronic Xarelto. Patient  Lives  in a  Memory  unit in [facility].  She had seizure-like activity. She was admitted.  Her CT scan was  negative for any acute changes it just showed old infarct. She was treated for UTI though her culture grew was negative She was started on Keppra 500 mg twice daily. Patient unable to give me any history today.  She was very sedated though per their nursing assistant she did eat her breakfast this morning and was at her baseline.  Patient would not respond to me or follow any commands.  Past Medical History:  Diagnosis Date  . Acute encephalopathy   . Aphasia following unspecified cerebrovascular disease   . 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 Depression   . Dysphagia following cerebrovascular disease   . Hepatic steatosis   . Hydroureteronephrosis   . Hypertension   . Insomnia   . Macular degeneration   . Major depressive disorder, recurrent ([place])   . Neuralgia and neuritis, unspecified ([place])   . Nontraumatic intracranial hemorrhage ([place])   . Stroke ([place])   . 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   . 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 ABDOMINAL HYSTERECTOMY    . APPENDECTOMY    . BREAST SURGERY    . MASTECTOMY Right     reports that she has never smoked. She has never used smokeless tobacco. She reports that she does not drink alcohol or use drugs. Social History   Socioeconomic History  . Marital status: Widowed    Spouse name: Not on file  . Number of children:  Not on file  . Years of education: Not on file  . Highest education level: Not on file  Occupational History  . Not on file  Social Needs  . Financial resource strain: Not on file  . Food insecurity    Worry: Not on file    Inability: Not on file  . Transportation needs    Medical: Not on file    Non-medical: Not on file  Tobacco Use  . Smoking status: Never Smoker  . Smokeless tobacco: Never Used  Substance and Sexual Activity  . Alcohol use: No  . Drug use: No  .  Sexual activity: Never  Lifestyle  . Physical activity    Days per week: Not on file    Minutes per session: Not on file  . Stress: Not at all  Relationships  . Social Herbalist on phone: Not on file    Gets together: Not on file    Attends religious service: Not on file    Active member of club or organization: Not on file    Attends meetings of clubs or organizations: Not on file    Relationship status: Not on file  . Intimate partner violence    Fear of current or ex partner: Not on file    Emotionally abused: Not on file    Physically abused: Not on file    Forced sexual activity: Not on file  Other Topics Concern  . Not on file  Social History Narrative   Lives at [facility], [place] since 04/25/2013   Widowed   Never smoked   Alcohol none   DNR, POA, Living Will    Functional Status Survey:    No family history on file.  Health Maintenance  Topic Date Due  . INFLUENZA VACCINE  08/07/2018  . TETANUS/TDAP  09/20/2026  . DEXA SCAN  Discontinued    Allergies  Allergen Reactions  . Hydrocodone Nausea And Vomiting  . Morphine And Related Nausea And Vomiting  . Actonel [Risedronate Sodium] Other (See Comments)    unknown  . Darvon [Propoxyphene Hcl] Other (See Comments)    unknown  . Oxycodone Hcl Other (See Comments)    unknown  . Pneumovax [Pneumococcal Polysaccharide Vaccine] Other (See Comments)    unknown    Outpatient Encounter Medications as of 07/27/2018  Medication Sig  . acetaminophen (TYLENOL) 325 MG tablet Take 650 mg by mouth every 4 (four) hours as needed.  . bisacodyl (DULCOLAX) 10 MG suppository Place 10 mg rectally daily as needed for mild constipation or moderate constipation.  . cephALEXin (KEFLEX) 500 MG capsule Take 1 capsule (500 mg total) by mouth every 12 (twelve) hours for 7 days. (Patient taking differently: Take 500 mg by mouth 2 (two) times a day. )  . hydrocortisone 2.5 % lotion Apply topically 2 (two) times daily  as needed. Apply to itchy  area on face.  . levETIRAcetam (KEPPRA) 500 MG tablet Take 1 tablet (500 mg total) by mouth 2 (two) times daily.  Marland Kitchen loratadine (CLARITIN) 10 MG tablet Take 10 mg by mouth daily.  . metoprolol tartrate (LOPRESSOR) 25 MG tablet Take 12.5 mg by mouth 2 (two) times daily.   . mirtazapine (REMERON) 15 MG tablet Take 7.5 mg by mouth at bedtime.   . NON FORMULARY MAGIC CUP WITH LUNCH AND DINNER  . nystatin (NYSTATIN) powder Apply topically daily as needed. Under left breast   . omeprazole (PRILOSEC) 10 MG capsule Take 10 mg  by mouth daily.  . polyethylene glycol (MIRALAX / GLYCOLAX) packet Take 17 g by mouth daily.  . risperiDONE (RISPERDAL) 0.25 MG tablet Take 0.25 mg by mouth 2 (two) times daily.  . Rivaroxaban (XARELTO) 15 MG TABS tablet Take 1 tablet (15 mg total) by mouth daily at 6 PM.  . sertraline (ZOLOFT) 100 MG tablet Take 100 mg by mouth at bedtime. Take with 50 mg tablet  . sertraline (ZOLOFT) 50 MG tablet Take 50 mg by mouth at bedtime. Take with 100 mg = 150 mg   . Simethicone (GAS RELIEF 80 PO) Take 80 mg by mouth 3 (three) times daily with meals.    No facility-administered encounter medications on file as of 07/27/2018.     Review of Systems  Unable to perform ROS: Dementia    Vitals:   07/27/18 1225  BP: (!) 120/58  Pulse: 70  Resp: (!) 24  Temp: 98.7 F (37.1 C)  SpO2: 93%  Weight: 156 lb 3.2 oz (70.9 kg)  Height: 5\' 3"  (1.6 m)   Body mass index is 27.67 kg/m. Physical Exam Vitals signs reviewed.  Constitutional:      Comments: Sleepy  HENT:     Head: Normocephalic.     Nose: Nose normal.     Mouth/Throat:     Mouth: Mucous membranes are moist.  Eyes:     Pupils: Pupils are equal, round, and reactive to light.  Neck:     Musculoskeletal: Neck supple.  Cardiovascular:     Rate and Rhythm: Normal rate and regular rhythm.     Heart sounds: No murmur.  Pulmonary:     Effort: Pulmonary effort is normal. No respiratory distress.      Breath sounds: Normal breath sounds. No wheezing.  Abdominal:     General: Abdomen is flat. Bowel sounds are normal.     Palpations: Abdomen is soft.  Musculoskeletal:     Comments: Mild edema Bilateral  Skin:    General: Skin is warm and dry.  Neurological:     General: No focal deficit present.     Comments: She was sleepy . But per nursing she was at her baseline this morning.  Psychiatric:        Mood and Affect: Mood normal.        Thought Content: Thought content normal.     Labs reviewed: Basic Metabolic Panel: Recent Labs    04/22/18 07/25/18 1032 07/26/18 0331  NA 143 141 143  K 4.2 4.1 3.7  CL  --  110 113*  CO2  --  21* 22  GLUCOSE  --  108* 110*  BUN 12 9 7*  CREATININE 0.9 0.99 1.15*  CALCIUM  --  8.9 8.3*   Liver Function Tests: Recent Labs    04/22/18 07/25/18 1032 07/26/18 0331  AST 14 22 26   ALT 16 21 19   ALKPHOS 84 88 82  BILITOT  --  0.4 0.5  PROT  --  6.0* 5.7*  ALBUMIN  --  3.4* 3.2*   Recent Labs    07/25/18 1032  LIPASE 34   No results for input(s): AMMONIA in the last 8760 hours. CBC: Recent Labs    04/22/18 07/25/18 1032 07/26/18 0331  WBC 7.2 12.0* 7.7  HGB 13.9 15.0 13.2  HCT 40 47.5* 41.4  MCV  --  96.5 95.6  PLT 217 220 214   Cardiac Enzymes: No results for input(s): CKTOTAL, CKMB, CKMBINDEX, TROPONINI in the last 8760 hours. BNP: Invalid  input(s): POCBNP Lab Results  Component Value Date   HGBA1C 5.5 12/02/2016   Lab Results  Component Value Date   TSH 1.32 02/16/2018   Lab Results  Component Value Date   VITAMINB12 192 07/14/2016   No results found for: FOLATE No results found for: IRON, TIBC, FERRITIN  Imaging and Procedures obtained prior to SNF admission: Ct Head Wo Contrast  Result Date: 07/25/2018 CLINICAL DATA:  Possible seizure. EXAM: CT HEAD WITHOUT CONTRAST TECHNIQUE: Contiguous axial images were obtained from the base of the skull through the vertex without intravenous contrast. COMPARISON:   07/13/2016. FINDINGS: Brain: No significant change in mild-to-moderate enlargement of the ventricles and subarachnoid spaces. Stable mild patchy white matter low density in both cerebral hemispheres. Stable old left occipital and posterior watershed infarct. Stable right lateral hygroma measuring 15.5 mm in thickness with no significant mass effect. No intracranial hemorrhage, mass lesion or CT evidence of acute infarction. Vascular: No hyperdense vessel or unexpected calcification. Skull: Normal. Negative for fracture or focal lesion. Sinuses/Orbits: Status post bilateral cataract extraction. Small sphenoid sinus retention cyst. Other: None. IMPRESSION: 1. No acute abnormality. 2. Stable atrophy, chronic small vessel white matter ischemic changes and old infarcts, as described above. Electronically Signed   By: Claudie Revering M.D.   On: 07/25/2018 11:25   Ct Angio Chest Pe W Or Wo Contrast  Result Date: 07/25/2018 CLINICAL DATA:  Shortness of breath with elevated D-dimer. EXAM: CT ANGIOGRAPHY CHEST WITH CONTRAST TECHNIQUE: Multidetector CT imaging of the chest was performed using the standard protocol during bolus administration of intravenous contrast. Multiplanar CT image reconstructions and MIPs were obtained to evaluate the vascular anatomy. CONTRAST:  58mL OMNIPAQUE IOHEXOL 350 MG/ML SOLN COMPARISON:  10/11/2014. FINDINGS: Cardiovascular: Examination is limited by mild motion artifact.Given this limitation, no PE was identified. The main pulmonary artery is within normal limits for size. There is no CT evidence of acute right heart strain. The visualized aorta is normal. Heart size is mildly enlarged. Mediastinum/Nodes: --No mediastinal or hilar lymphadenopathy. --No axillary lymphadenopathy. --No supraclavicular lymphadenopathy. --Normal thyroid gland. --The esophagus is unremarkable Lungs/Pleura: There is stable pleural thickening and calcification along the peripheral right upper lobe. There is  atelectasis at the lung bases. There is no significant pleural effusion. No pneumothorax. The lung volumes are low. The trachea is unremarkable. Upper Abdomen: There is high-density material in the upper poles of both kidneys. This is favored to represent excretion of contrast as opposed to nonobstructing stones. There is moderate stenosis at the origin of the celiac axis with some poststenotic dilatation of the proximal celiac trunk. Musculoskeletal: There is a sclerotic band through the T9 vertebral body which is new since prior study. There is no other acute appearing osseous abnormality. Review of the MIP images confirms the above findings. IMPRESSION: 1. No PE identified. 2. Low lung volumes with bibasilar atelectasis. 3. Age-indeterminate fracture of the T9 vertebral body. Correlation with physical exam and patient's symptoms is recommended. This is new since CT in 2018. Aortic Atherosclerosis (ICD10-I70.0). Electronically Signed   By: Constance Holster M.D.   On: 07/25/2018 15:39   Dg Chest Port 1 View  Result Date: 07/25/2018 CLINICAL DATA:  Altered behavior, possible seizure. EXAM: PORTABLE CHEST 1 VIEW COMPARISON:  Chest x-rays dated 07/13/2016 and 08/20/2014. FINDINGS: Stable cardiomegaly. Lungs are clear. No pleural effusion or pneumothorax seen. Osseous structures about the chest are unremarkable. IMPRESSION: No active disease. No evidence of pneumonia or pulmonary edema. Stable cardiomegaly. Electronically Signed   By: Cherlynn Kaiser  Enriqueta Shutter M.D.   On: 07/25/2018 10:45    Assessment/Plan New onset seizure activity Her CT scan was negative for any acute process Her urine culture was eventually negative She was started on Keppra 500 mg twice daily Will reduce the dose to 250 mg twice daily We will continue to monitor closely and provide supportive care UTI Will discontinue the Keflex another 2 days as her urine culture was negative History of chronic DVT Continue on Xarelto History of CVA  Continue on Xarelto Her blood pressure is controlled Essential hypertension Stable on metoprolol GERD Continue on omeprazole Vascular dementia with behavior issues We will continue low-dose of Risperdal for now Depression Decrease the dose of Zoloft to 125 mg Continue Remeron 7.5 mg  Family/ staff Communication:   Labs/tests ordered: CBC and BMP in 1 week  Total time spent in this patient care encounter was  45_  minutes; greater than 50% of the visit spent counseling  staff, reviewing records , Labs and coordinating care for problems addressed at this encounter.

## 2018-07-30 LAB — CULTURE, BLOOD (ROUTINE X 2): Culture: NO GROWTH

## 2018-08-03 ENCOUNTER — Non-Acute Institutional Stay: Payer: Medicare Other | Admitting: Internal Medicine

## 2018-08-03 LAB — CBC AND DIFFERENTIAL
HCT: 43 (ref 36–46)
Hemoglobin: 14.3 (ref 12.0–16.0)
Platelets: 245 (ref 150–399)
WBC: 7.1

## 2018-08-03 LAB — BASIC METABOLIC PANEL
BUN: 16 (ref 4–21)
Creatinine: 1.1 (ref 0.5–1.1)
Glucose: 88
Potassium: 3.9 (ref 3.4–5.3)
Sodium: 141 (ref 137–147)

## 2018-08-04 ENCOUNTER — Other Ambulatory Visit: Payer: Self-pay

## 2018-09-03 ENCOUNTER — Encounter: Payer: Self-pay | Admitting: Nurse Practitioner

## 2018-09-03 ENCOUNTER — Non-Acute Institutional Stay (SKILLED_NURSING_FACILITY): Payer: Medicare Other | Admitting: Nurse Practitioner

## 2018-09-03 ENCOUNTER — Other Ambulatory Visit: Payer: Self-pay | Admitting: *Deleted

## 2018-09-03 DIAGNOSIS — I10 Essential (primary) hypertension: Secondary | ICD-10-CM

## 2018-09-03 DIAGNOSIS — F0280 Dementia in other diseases classified elsewhere without behavioral disturbance: Secondary | ICD-10-CM

## 2018-09-03 DIAGNOSIS — F0151 Vascular dementia with behavioral disturbance: Secondary | ICD-10-CM

## 2018-09-03 DIAGNOSIS — F329 Major depressive disorder, single episode, unspecified: Secondary | ICD-10-CM

## 2018-09-03 DIAGNOSIS — D6859 Other primary thrombophilia: Secondary | ICD-10-CM

## 2018-09-03 DIAGNOSIS — R569 Unspecified convulsions: Secondary | ICD-10-CM

## 2018-09-03 DIAGNOSIS — K5909 Other constipation: Secondary | ICD-10-CM | POA: Diagnosis not present

## 2018-09-03 DIAGNOSIS — R627 Adult failure to thrive: Secondary | ICD-10-CM

## 2018-09-03 DIAGNOSIS — F0393 Unspecified dementia, unspecified severity, with mood disturbance: Secondary | ICD-10-CM

## 2018-09-03 DIAGNOSIS — K219 Gastro-esophageal reflux disease without esophagitis: Secondary | ICD-10-CM

## 2018-09-03 DIAGNOSIS — F01518 Vascular dementia, unspecified severity, with other behavioral disturbance: Secondary | ICD-10-CM

## 2018-09-03 LAB — BASIC METABOLIC PANEL
Calcium: 8.7
Carbon Dioxide, Total: 23
Chloride: 108
EGFR (Non-African Amer.): 47

## 2018-09-03 NOTE — Assessment & Plan Note (Signed)
Stable, continue Memory care unit [facility] for safety and care assistance, w/c for mobility.

## 2018-09-03 NOTE — Assessment & Plan Note (Signed)
Blood pressure is controlled, continue Metoprolol 12.5mg bid.  

## 2018-09-03 NOTE — Assessment & Plan Note (Signed)
Stable, continue Keppra 500mg  bid.

## 2018-09-03 NOTE — Assessment & Plan Note (Signed)
Her mood is not well controlled, s/s of depression presents, HPOA daughter declined psychotropic meds change. Will continue Sertraline 125mg  qd, Risperidone 0.25mg  bid, Mirtazapine 7.5mg  qd.

## 2018-09-03 NOTE — Assessment & Plan Note (Addendum)
Hx of CVA, DVT. Continue Xarelto

## 2018-09-03 NOTE — Progress Notes (Signed)
Location:  [facility] Room Number: 109 Place of Service:  SNF (31) Provider:  Marlana Latus  NP  Janequa Kipnis X, NP  Patient Care Team: Recardo Linn X, NP as PCP - General (Internal Medicine) Dannetta Lekas X, NP as Nurse Practitioner (Internal Medicine) Dunn, Warnell Bureau as Consulting Physician (Optometry) Monna Fam, MD as Consulting Physician (Ophthalmology) Lucky Cowboy, Gordy Clement, NP as Nurse Practitioner (Hospice and Palliative Medicine) Virgie Dad, MD as Consulting Physician (Internal Medicine)  Extended Emergency Contact Information Primary Emergency Contact: [facility] Address: [id number], [place] [address] [name] of [place] Phone: [phone] Work Phone: [phone] Mobile Phone: [phone] Relation: Daughter  Code Status:  DNR Goals of care: Advanced Directive information Advanced Directives 09/03/2018  Does Patient Have a Medical Advance Directive? Yes  Type of Paramedic of [place];Out of facility DNR (pink MOST or yellow form)  Does patient want to make changes to medical advance directive? No - Patient declined  Copy of [place] in Chart? Yes - validated most recent copy scanned in chart (See row information)  Pre-existing out of facility DNR order (yellow form or pink MOST form) Yellow form placed in chart (order not valid for inpatient use)     Chief Complaint  Patient presents with  . Medical Management of Chronic Issues    HPI:  Pt is a 85 y.o. female seen today for medical management of chronic diseases.    The patient resides in memory care unit [facility] for safety and care assistance, w/c for mobility. Hx of depression, still have s/s of depression, but she is able to sleep, eat. On Sertraline 125mg  qd, Risperidone 0.25mg  bid, Mirtazapine 7.5mg  qd, gradual weight loss, about #5Ibs in the past month. She denied abd pain, nausea, vomiting,  constipation, diarrhea, or appetite changes. Her HPOA daughter declined psychotropic meds change presently. Protein S/C deficiency, on chronic Xarelto 10mg  qd. Constipation, stable, on MiraLax qd. GERD, stable, taking Omeprazole 10mg  qd. HTN, blood pressure is controlled on Metoprolol 12.5mg  bid. Seizures, stable, on Keppra 500mg  bid.   Past Medical History:  Diagnosis Date  . Acute encephalopathy   . Aphasia following unspecified cerebrovascular disease   . 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 Depression   . Dysphagia following cerebrovascular disease   . Hepatic steatosis   . Hydroureteronephrosis   . Hypertension   . Insomnia   . Macular degeneration   . Major depressive disorder, recurrent ([place])   . Neuralgia and neuritis, unspecified (CODE)   . Nontraumatic intracranial hemorrhage ([place])   . Stroke ([place])   . 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   . 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 ABDOMINAL HYSTERECTOMY    . APPENDECTOMY    . BREAST SURGERY    . MASTECTOMY Right     Allergies  Allergen Reactions  . Hydrocodone Nausea And Vomiting  . Morphine And Related Nausea And Vomiting  . Actonel [Risedronate Sodium] Other (See Comments)    unknown  . Darvon [Propoxyphene Hcl] Other (See Comments)    unknown  . Oxycodone Hcl Other (See Comments)    unknown  . Pneumovax [Pneumococcal Polysaccharide Vaccine] Other (See Comments)    unknown    Outpatient Encounter Medications as of 09/03/2018  Medication Sig  .  acetaminophen (TYLENOL) 325 MG tablet Take 650 mg by mouth every 4 (four) hours as needed.  . bisacodyl (DULCOLAX) 10 MG suppository Place 10 mg rectally daily as needed for mild constipation or moderate constipation.  . hydrocortisone 2.5 % lotion Apply topically 2 (two) times daily as needed. Apply to itchy  area on face.  . levETIRAcetam  (KEPPRA) 500 MG tablet Take 1 tablet (500 mg total) by mouth 2 (two) times daily.  Marland Kitchen loratadine (CLARITIN) 10 MG tablet Take 10 mg by mouth daily.  . metoprolol tartrate (LOPRESSOR) 25 MG tablet Take 12.5 mg by mouth 2 (two) times daily.   . mirtazapine (REMERON) 15 MG tablet Take 7.5 mg by mouth at bedtime.   . NON FORMULARY MAGIC CUP WITH LUNCH AND DINNER  . nystatin (NYSTATIN) powder Apply topically daily as needed. Under left breast   . omeprazole (PRILOSEC) 10 MG capsule Take 10 mg by mouth daily.  . polyethylene glycol (MIRALAX / GLYCOLAX) packet Take 17 g by mouth daily.  . risperiDONE (RISPERDAL) 0.25 MG tablet Take 0.25 mg by mouth 2 (two) times daily.  . Rivaroxaban (XARELTO) 15 MG TABS tablet Take 1 tablet (15 mg total) by mouth daily at 6 PM.  . sertraline (ZOLOFT) 100 MG tablet Take 100 mg by mouth at bedtime. Take with 25 mg tablet  . sertraline (ZOLOFT) 25 MG tablet Take 25 mg by mouth daily. Give 25mg  along with 100mg  to =125mg .  . Simethicone (GAS RELIEF 80 PO) Take 80 mg by mouth 3 (three) times daily with meals.   . [DISCONTINUED] sertraline (ZOLOFT) 50 MG tablet Take 50 mg by mouth at bedtime. Take with 100 mg = 150 mg    No facility-administered encounter medications on file as of 09/03/2018.    ROS was provided with assistance of staff Review of Systems  Constitutional: Positive for unexpected weight change. Negative for activity change, appetite change, chills, diaphoresis, fatigue and fever.       Weight loss about #5Ibs in the past month. Am sleepiness is at her baseline.   HENT: Positive for hearing loss. Negative for congestion and voice change.   Respiratory: Negative for cough, shortness of breath and wheezing.   Cardiovascular: Positive for leg swelling. Negative for chest pain and palpitations.  Gastrointestinal: Negative for abdominal distention, abdominal pain, constipation, diarrhea, nausea and vomiting.  Genitourinary: Negative for difficulty urinating,  dysuria and urgency.  Musculoskeletal: Positive for gait problem.  Skin: Negative for color change and pallor.  Neurological: Negative for dizziness, speech difficulty, weakness and headaches.       Dementia  Psychiatric/Behavioral: Positive for confusion and dysphoric mood. Negative for agitation, hallucinations and sleep disturbance. The patient is not nervous/anxious.     Immunization History  Administered Date(s) Administered  . Influenza Whole 10/19/2017  . Influenza-Unspecified 11/20/2013, 09/26/2014, 10/24/2015, 11/03/2016  . Tdap 09/19/2016  . Zoster 01/07/2012   Pertinent  Health Maintenance Due  Topic Date Due  . INFLUENZA VACCINE  08/07/2018  . DEXA SCAN  Discontinued   Fall Risk  09/15/2017 09/05/2016  Falls in the past year? No No   Functional Status Survey:    Vitals:   09/03/18 0914  BP: 130/70  Pulse: 85  Resp: 18  Temp: (!) 96.9 F (36.1 C)  SpO2: 94%  Weight: 151 lb 9.6 oz (68.8 kg)  Height: 5\' 3"  (1.6 m)   Body mass index is 26.85 kg/m. Physical Exam Vitals signs and nursing note reviewed.  Constitutional:  General: She is not in acute distress.    Appearance: Normal appearance. She is not ill-appearing, toxic-appearing or diaphoretic.     Comments: Over weight.   HENT:     Head: Normocephalic and atraumatic.     Nose: Nose normal.     Mouth/Throat:     Mouth: Mucous membranes are moist.  Eyes:     Extraocular Movements: Extraocular movements intact.     Conjunctiva/sclera: Conjunctivae normal.     Pupils: Pupils are equal, round, and reactive to light.  Neck:     Musculoskeletal: Normal range of motion and neck supple.  Cardiovascular:     Rate and Rhythm: Normal rate and regular rhythm.     Heart sounds: No murmur.  Pulmonary:     Breath sounds: No wheezing, rhonchi or rales.  Chest:     Chest wall: No tenderness.  Abdominal:     General: Bowel sounds are normal.     Palpations: Abdomen is soft.     Tenderness: There is no  abdominal tenderness. There is no right CVA tenderness, left CVA tenderness, guarding or rebound.  Musculoskeletal:     Right lower leg: Edema present.     Left lower leg: Edema present.     Comments: Trace edema BLE, w/c for mobility.   Skin:    General: Skin is warm and dry.  Neurological:     General: No focal deficit present.     Mental Status: She is alert. Mental status is at baseline.     Cranial Nerves: No cranial nerve deficit.     Motor: No weakness.     Coordination: Coordination normal.     Gait: Gait abnormal.     Comments: Oriented to self.   Psychiatric:        Mood and Affect: Mood normal.        Behavior: Behavior normal.     Labs reviewed: Recent Labs    07/25/18 1032 07/26/18 0331 08/03/18  NA 141 143 141  K 4.1 3.7 3.9  CL 110 113* 108  CO2 21* 22 23  GLUCOSE 108* 110*  --   BUN 9 7* 16  CREATININE 0.99 1.15* 1.1  CALCIUM 8.9 8.3* 8.7   Recent Labs    04/22/18 07/25/18 1032 07/26/18 0331  AST 14 22 26   ALT 16 21 19   ALKPHOS 84 88 82  BILITOT  --  0.4 0.5  PROT  --  6.0* 5.7*  ALBUMIN  --  3.4* 3.2*   Recent Labs    07/25/18 1032 07/26/18 0331 08/03/18  WBC 12.0* 7.7 7.1  HGB 15.0 13.2 14.3  HCT 47.5* 41.4 43  MCV 96.5 95.6  --   PLT 220 214 245   Lab Results  Component Value Date   TSH 1.32 02/16/2018   Lab Results  Component Value Date   HGBA1C 5.5 12/02/2016   Lab Results  Component Value Date   CHOL 174 10/21/2016   HDL 45 10/21/2016   LDLCALC 103 10/21/2016   TRIG 158 10/21/2016    Significant Diagnostic Results in last 30 days:  No results found.  Assessment/Plan Essential hypertension, benign Blood pressure is controlled, continue Metoprolol 12.5mg  bid.   GERD (gastroesophageal reflux disease) Stable, continue Omeprazole 10mg  qd.   Chronic constipation Stable, continue MiraLax daily.   Vascular dementia with behavior disturbance ([place]) Stable, continue Memory care unit [facility] for safety and care assistance,  w/c for mobility.   Depression due to dementia Her mood is  not well controlled, s/s of depression presents, HPOA daughter declined psychotropic meds change. Will continue Sertraline 125mg  qd, Risperidone 0.25mg  bid, Mirtazapine 7.5mg  qd.    Protein S deficiency Hx of CVA, DVT. Continue Xarelto  Protein C deficiency (HCC) Hx of DVT, CVA .Continue Xarelto  Seizure ([place]) Stable, continue Keppra 500mg  bid.   Adult failure to thrive Gradual weight loss, f/u dietary.      Family/ staff Communication: plan of care reviewed with the patient and charge nurse.   Labs/tests ordered:  none  Time spend 25 minutes.

## 2018-09-03 NOTE — Assessment & Plan Note (Signed)
Gradual weight loss, f/u dietary.

## 2018-09-03 NOTE — Assessment & Plan Note (Addendum)
Hx of DVT, CVA .Continue Xarelto

## 2018-09-03 NOTE — Assessment & Plan Note (Signed)
Stable, continue Omeprazole 10mg qd.  

## 2018-09-03 NOTE — Assessment & Plan Note (Signed)
Stable, continue MiraLax daily.  

## 2018-10-25 ENCOUNTER — Encounter: Payer: Self-pay | Admitting: Nurse Practitioner

## 2018-10-25 ENCOUNTER — Non-Acute Institutional Stay (SKILLED_NURSING_FACILITY): Payer: Medicare Other | Admitting: Nurse Practitioner

## 2018-10-25 DIAGNOSIS — R569 Unspecified convulsions: Secondary | ICD-10-CM | POA: Diagnosis not present

## 2018-10-25 DIAGNOSIS — F331 Major depressive disorder, recurrent, moderate: Secondary | ICD-10-CM | POA: Diagnosis not present

## 2018-10-25 DIAGNOSIS — Z7901 Long term (current) use of anticoagulants: Secondary | ICD-10-CM

## 2018-10-25 DIAGNOSIS — I10 Essential (primary) hypertension: Secondary | ICD-10-CM | POA: Diagnosis not present

## 2018-10-25 DIAGNOSIS — K5909 Other constipation: Secondary | ICD-10-CM

## 2018-10-25 DIAGNOSIS — K219 Gastro-esophageal reflux disease without esophagitis: Secondary | ICD-10-CM

## 2018-10-25 NOTE — Assessment & Plan Note (Signed)
Stable, continue MiraLax qd.  

## 2018-10-25 NOTE — Assessment & Plan Note (Signed)
Blood pressure is controlled, continue Metoprolol 12.5mg bid.  

## 2018-10-25 NOTE — Assessment & Plan Note (Signed)
protein C/S deficiency, hx of DVT, s/p CVA, continue  Xarelto 15mg  po daily.

## 2018-10-25 NOTE — Assessment & Plan Note (Signed)
her mood is stable, continue  Mirtazapine 7.5mg  qd, Sertraline 125mg  qd, Risperidone 0.25mg  bid.

## 2018-10-25 NOTE — Assessment & Plan Note (Signed)
Stable, continue Omeprazole 10mg qd.  

## 2018-10-25 NOTE — Progress Notes (Signed)
Location:   SNF [facility] Room Number: [id number] of Service:  SNF (31) Provider:  Vencent Hauschild X, NP  Elon Lomeli X, NP  Patient Care Team: Everest Brod X, NP as PCP - General (Internal Medicine) Anique Beckley X, NP as Nurse Practitioner (Internal Medicine) Dunn, Warnell Bureau as Consulting Physician (Optometry) Monna Fam, MD as Consulting Physician (Ophthalmology) Lucky Cowboy, Gordy Clement, NP as Nurse Practitioner (Hospice and Palliative Medicine) Virgie Dad, MD as Consulting Physician (Internal Medicine)  Extended Emergency Contact Information Primary Emergency Contact: [facility] Address: [id number] [place], [place] [address] [name] of [place] Phone: [phone] Work Phone: [phone] Mobile Phone: [phone] Relation: Daughter  Code Status:  DNR Goals of care: Advanced Directive information Advanced Directives 10/25/2018  Does Patient Have a Medical Advance Directive? Yes  Type of Paramedic of [place];Out of facility DNR (pink MOST or yellow form)  Does patient want to make changes to medical advance directive? No - Patient declined  Copy of [place] in Chart? Yes - validated most recent copy scanned in chart (See row information)  Pre-existing out of facility DNR order (yellow form or pink MOST form) Yellow form placed in chart (order not valid for inpatient use)     Chief Complaint  Patient presents with  . Medical Management of Chronic Issues    HPI:  Pt is a 85 y.o. female seen today for medical management of chronic diseases.    The patient resides in Memory care unit [facility] for safety, care assistance, w/c for mobility, her mood is stable, on Mirtazapine 7.5mg  qd, Sertraline 125mg  qd, Risperidone 0.25mg  bid. No active seizures, on Keppra 250mg  bid. HTN, blood pressure is controlled, on Metoprolol 12.5mg  bid. GERD, stable, on Omeprazole 10mg  qd. Constipation, stable, on MiraLax qd.  Protein C/S deficiency, Hx of CVA, DVT, chronic Xarelto 15mg  qd.    Past Medical History:  Diagnosis Date  . Acute encephalopathy   . Aphasia following unspecified cerebrovascular disease   . 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 Depression   . Dysphagia following cerebrovascular disease   . Hepatic steatosis   . Hydroureteronephrosis   . Hypertension   . Insomnia   . Macular degeneration   . Major depressive disorder, recurrent ([place])   . Neuralgia and neuritis, unspecified ([place])   . Nontraumatic intracranial hemorrhage ([place])   . Stroke ([place])   . 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   . 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 ABDOMINAL HYSTERECTOMY    . APPENDECTOMY    . BREAST SURGERY    . MASTECTOMY Right     Allergies  Allergen Reactions  . Hydrocodone Nausea And Vomiting  . Morphine And Related Nausea And Vomiting  . Actonel [Risedronate Sodium] Other (See Comments)    unknown  . Darvon [Propoxyphene Hcl] Other (See Comments)    unknown  . Oxycodone Hcl Other (See Comments)    unknown  . Pneumovax [Pneumococcal Polysaccharide Vaccine] Other (See Comments)    unknown    Allergies as of 10/25/2018      Reactions   Hydrocodone Nausea And Vomiting   Morphine And Related Nausea And Vomiting   Actonel [risedronate Sodium] Other (See Comments)   unknown   Darvon [propoxyphene Hcl] Other (See Comments)  unknown   Oxycodone Hcl Other (See Comments)   unknown   Pneumovax [pneumococcal Polysaccharide Vaccine] Other (See Comments)   unknown      Medication List       Accurate as of October 25, 2018  3:43 PM. If you have any questions, ask your nurse or doctor.        acetaminophen 325 MG tablet Commonly known as: TYLENOL Take 650 mg by mouth every 4 (four) hours as needed.   bisacodyl 10 MG suppository Commonly known as: DULCOLAX  Place 10 mg rectally daily as needed for mild constipation or moderate constipation.   GAS RELIEF 80 PO Take 80 mg by mouth 3 (three) times daily with meals.   hydrocortisone 2.5 % lotion Apply topically 2 (two) times daily as needed. Apply to itchy  area on face.   levETIRAcetam 100 MG/ML solution Commonly known as: KEPPRA 250 mg = 2.5 ML; oral Twice A Day What changed: Another medication with the same name was removed. Continue taking this medication, and follow the directions you see here. Changed by: Stanley Helmuth X Jaidee Stipe, NP   loratadine 10 MG tablet Commonly known as: CLARITIN Take 10 mg by mouth daily.   metoprolol tartrate 25 MG tablet Commonly known as: LOPRESSOR Take 12.5 mg by mouth 2 (two) times daily.   mirtazapine 15 MG tablet Commonly known as: REMERON Take 7.5 mg by mouth at bedtime.   NON FORMULARY MAGIC CUP WITH LUNCH AND DINNER   nystatin powder Generic drug: nystatin Apply topically daily as needed. Under left breast   omeprazole 10 MG capsule Commonly known as: PRILOSEC Take 10 mg by mouth daily.   polyethylene glycol 17 g packet Commonly known as: MIRALAX / GLYCOLAX Take 17 g by mouth daily.   risperiDONE 0.25 MG tablet Commonly known as: RISPERDAL Take 0.25 mg by mouth 2 (two) times daily.   Rivaroxaban 15 MG Tabs tablet Commonly known as: XARELTO Take 1 tablet (15 mg total) by mouth daily at 6 PM.   sertraline 100 MG tablet Commonly known as: ZOLOFT Take 100 mg by mouth at bedtime. Take with 25 mg tablet   sertraline 25 MG tablet Commonly known as: ZOLOFT Take 25 mg by mouth daily. Give 25mg  along with 100mg  to =125mg .      ROS was provided with assistance of staff.  Review of Systems  Constitutional: Negative for activity change, appetite change, chills, diaphoresis, fatigue and fever.  HENT: Positive for hearing loss. Negative for congestion and voice change.   Eyes: Negative for visual disturbance.  Respiratory: Negative for cough,  shortness of breath and wheezing.   Cardiovascular: Positive for leg swelling. Negative for chest pain and palpitations.  Gastrointestinal: Negative for abdominal distention, abdominal pain, constipation, diarrhea, nausea and vomiting.  Genitourinary: Negative for difficulty urinating, dysuria and urgency.  Musculoskeletal: Positive for gait problem.  Skin: Negative for color change and pallor.  Neurological: Negative for dizziness, speech difficulty, weakness and headaches.       Dementia  Psychiatric/Behavioral: Positive for confusion. Negative for agitation, behavioral problems, hallucinations and sleep disturbance. The patient is not nervous/anxious.     Immunization History  Administered Date(s) Administered  . Influenza Whole 10/19/2017  . Influenza-Unspecified 11/20/2013, 09/26/2014, 10/24/2015, 11/03/2016  . Tdap 09/19/2016  . Zoster 01/07/2012   Pertinent  Health Maintenance Due  Topic Date Due  . INFLUENZA VACCINE  08/07/2018  . DEXA SCAN  Discontinued   Fall Risk  09/15/2017 09/05/2016  Falls in the past year? No  No   Functional Status Survey:    Vitals:   10/25/18 1503  BP: 120/62  Pulse: 72  Resp: 20  Temp: (!) 97.1 F (36.2 C)  SpO2: 92%  Weight: 156 lb 3.2 oz (70.9 kg)  Height: 5\' 3"  (1.6 m)   Body mass index is 27.67 kg/m. Physical Exam Constitutional:      General: She is not in acute distress.    Appearance: Normal appearance. She is not ill-appearing or toxic-appearing.     Comments: Over weight  HENT:     Head: Normocephalic and atraumatic.     Nose: Nose normal.     Mouth/Throat:     Mouth: Mucous membranes are moist.  Eyes:     Extraocular Movements: Extraocular movements intact.     Conjunctiva/sclera: Conjunctivae normal.     Pupils: Pupils are equal, round, and reactive to light.  Neck:     Musculoskeletal: Normal range of motion.  Cardiovascular:     Rate and Rhythm: Normal rate.     Heart sounds: No murmur.  Pulmonary:     Breath  sounds: No wheezing, rhonchi or rales.  Abdominal:     General: Bowel sounds are normal. There is no distension.     Palpations: Abdomen is soft.     Tenderness: There is no abdominal tenderness. There is no right CVA tenderness, left CVA tenderness, guarding or rebound.  Musculoskeletal:     Right lower leg: Edema present.     Left lower leg: Edema present.     Comments: Trace edema BLE  Skin:    General: Skin is warm.  Neurological:     General: No focal deficit present.     Mental Status: She is alert. Mental status is at baseline.     Motor: No weakness.     Coordination: Coordination normal.     Gait: Gait abnormal.     Comments: Oriented to self.   Psychiatric:        Mood and Affect: Mood normal.        Behavior: Behavior normal.     Labs reviewed: Recent Labs    07/25/18 1032 07/26/18 0331 08/03/18  NA 141 143 141  K 4.1 3.7 3.9  CL 110 113* 108  CO2 21* 22 23  GLUCOSE 108* 110*  --   BUN 9 7* 16  CREATININE 0.99 1.15* 1.1  CALCIUM 8.9 8.3* 8.7   Recent Labs    04/22/18 07/25/18 1032 07/26/18 0331  AST 14 22 26   ALT 16 21 19   ALKPHOS 84 88 82  BILITOT  --  0.4 0.5  PROT  --  6.0* 5.7*  ALBUMIN  --  3.4* 3.2*   Recent Labs    07/25/18 1032 07/26/18 0331 08/03/18  WBC 12.0* 7.7 7.1  HGB 15.0 13.2 14.3  HCT 47.5* 41.4 43  MCV 96.5 95.6  --   PLT 220 214 245   Lab Results  Component Value Date   TSH 1.32 02/16/2018   Lab Results  Component Value Date   HGBA1C 5.5 12/02/2016   Lab Results  Component Value Date   CHOL 174 10/21/2016   HDL 45 10/21/2016   LDLCALC 103 10/21/2016   TRIG 158 10/21/2016    Significant Diagnostic Results in last 30 days:  No results found.  Assessment/Plan Chronic anticoagulation protein C/S deficiency, hx of DVT, s/p CVA, continue  Xarelto 15mg  po daily.   Major depressive disorder, recurrent ([place]) her mood is stable, continue  Mirtazapine 7.5mg  qd, Sertraline 125mg  qd, Risperidone 0.25mg  bid.    Seizure ([place]) Stable, continue Keppra 250mg  bid.   Essential hypertension, benign Blood pressure is controlled, continue Metoprolol 12.5mg  bid.   GERD (gastroesophageal reflux disease) Stable, continue Omeprazole 10mg  qd.   Chronic constipation Stable, continue MiraLax qd.      Family/ staff Communication: plan of care reviewed with the patient and charge nurse.   Labs/tests ordered:  none  Time spend 25 minutes.

## 2018-10-25 NOTE — Assessment & Plan Note (Signed)
Stable, continue Keppra 250mg bid.  

## 2018-11-05 ENCOUNTER — Non-Acute Institutional Stay (SKILLED_NURSING_FACILITY): Payer: Medicare Other | Admitting: Nurse Practitioner

## 2018-11-05 ENCOUNTER — Encounter: Payer: Self-pay | Admitting: Nurse Practitioner

## 2018-11-05 DIAGNOSIS — F330 Major depressive disorder, recurrent, mild: Secondary | ICD-10-CM

## 2018-11-05 DIAGNOSIS — S81811A Laceration without foreign body, right lower leg, initial encounter: Secondary | ICD-10-CM | POA: Diagnosis not present

## 2018-11-05 DIAGNOSIS — Z7901 Long term (current) use of anticoagulants: Secondary | ICD-10-CM | POA: Diagnosis not present

## 2018-11-05 DIAGNOSIS — F0151 Vascular dementia with behavioral disturbance: Secondary | ICD-10-CM

## 2018-11-05 DIAGNOSIS — F01518 Vascular dementia, unspecified severity, with other behavioral disturbance: Secondary | ICD-10-CM

## 2018-11-05 NOTE — Progress Notes (Signed)
Location:   [facility]   Place of Service:   [facility] Provider: [facility] Novak Stgermaine NP  Attila Mccarthy X, NP  Patient Care Team: Mckinnley Smithey X, NP as PCP - General (Internal Medicine) Azlan Hanway X, NP as Nurse Practitioner (Internal Medicine) Idolina Primer, Warnell Bureau as Consulting Physician (Optometry) Monna Fam, MD as Consulting Physician (Ophthalmology) Lucky Cowboy, Gordy Clement, NP as Nurse Practitioner (Hospice and Palliative Medicine) Virgie Dad, MD as Consulting Physician (Internal Medicine)  Extended Emergency Contact Information Primary Emergency Contact: [facility] Address: [id number] [place], [place] [address] [name] of [place] Phone: [phone] Work Phone: [phone] Mobile Phone: [phone] Relation: Daughter  Code Status: DNR Goals of care: Advanced Directive information Advanced Directives 10/25/2018  Does Patient Have a Medical Advance Directive? Yes  Type of Paramedic of [place];Out of facility DNR (pink MOST or yellow form)  Does patient want to make changes to medical advance directive? No - Patient declined  Copy of [place] in Chart? Yes - validated most recent copy scanned in chart (See row information)  Pre-existing out of facility DNR order (yellow form or pink MOST form) Yellow form placed in chart (order not valid for inpatient use)     Chief Complaint  Patient presents with  . Acute Visit    right shin skin tear    HPI:  Pt is a 85 y.o. female seen today for an acute visit for skin tear. Obtained skin tear R shin during assisted transfer, intact steri strips closure, no s/s of infection.   The patient resides in memory care unit [facility] for safety, care assistance. Her mood is managed on Sertraline 125mg  qd, Risperdal 0.25mg  bid, Mirtazapine 7.5mg  qd. Hx of protein C/S deficiency, CVA, DVT, on chronic Xarelto 15mg  qd. HTN, blood pressure is controlled on Metoprolol 12.5mg  bid.      Past Medical History:  Diagnosis Date  . Acute encephalopathy   . Aphasia following unspecified cerebrovascular disease   . 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 Depression   . Dysphagia following cerebrovascular disease   . Hepatic steatosis   . Hydroureteronephrosis   . Hypertension   . Insomnia   . Macular degeneration   . Major depressive disorder, recurrent ([place])   . Neuralgia and neuritis, unspecified (CODE)   . Nontraumatic intracranial hemorrhage ([facility])   . Stroke ([place])   . 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   . 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 ABDOMINAL HYSTERECTOMY    . APPENDECTOMY    . BREAST SURGERY    . MASTECTOMY Right     Allergies  Allergen Reactions  . Hydrocodone Nausea And Vomiting  . Morphine And Related Nausea And Vomiting  . Actonel [Risedronate Sodium] Other (See Comments)    unknown  . Darvon [Propoxyphene Hcl] Other (See Comments)    unknown  . Oxycodone Hcl Other (See Comments)    unknown  . Pneumovax [Pneumococcal Polysaccharide Vaccine] Other (See Comments)    unknown    Allergies as of 11/05/2018      Reactions   Hydrocodone Nausea And Vomiting   Morphine And Related Nausea And Vomiting   Actonel [risedronate Sodium] Other (See Comments)   unknown   Darvon [propoxyphene Hcl] Other (See Comments)   unknown  Oxycodone Hcl Other (See Comments)   unknown   Pneumovax [pneumococcal Polysaccharide Vaccine] Other (See Comments)   unknown      Medication List       Accurate as of November 05, 2018 11:59 PM. If you have any questions, ask your nurse or doctor.        acetaminophen 325 MG tablet Commonly known as: TYLENOL Take 650 mg by mouth every 4 (four) hours as needed.   bisacodyl 10 MG suppository Commonly known as: DULCOLAX Place 10 mg rectally daily as needed for mild constipation or  moderate constipation.   GAS RELIEF 80 PO Take 80 mg by mouth 3 (three) times daily with meals.   hydrocortisone 2.5 % lotion Apply topically 2 (two) times daily as needed. Apply to itchy  area on face.   levETIRAcetam 100 MG/ML solution Commonly known as: KEPPRA 250 mg = 2.5 ML; oral Twice A Day   loratadine 10 MG tablet Commonly known as: CLARITIN Take 10 mg by mouth daily.   metoprolol tartrate 25 MG tablet Commonly known as: LOPRESSOR Take 12.5 mg by mouth 2 (two) times daily.   mirtazapine 15 MG tablet Commonly known as: REMERON Take 7.5 mg by mouth at bedtime.   NON FORMULARY MAGIC CUP WITH LUNCH AND DINNER   nystatin powder Generic drug: nystatin Apply topically daily as needed. Under left breast   omeprazole 10 MG capsule Commonly known as: PRILOSEC Take 10 mg by mouth daily.   polyethylene glycol 17 g packet Commonly known as: MIRALAX / GLYCOLAX Take 17 g by mouth daily.   risperiDONE 0.25 MG tablet Commonly known as: RISPERDAL Take 0.25 mg by mouth 2 (two) times daily.   Rivaroxaban 15 MG Tabs tablet Commonly known as: XARELTO Take 1 tablet (15 mg total) by mouth daily at 6 PM.   sertraline 100 MG tablet Commonly known as: ZOLOFT Take 100 mg by mouth at bedtime. Take with 25 mg tablet   sertraline 25 MG tablet Commonly known as: ZOLOFT Take 25 mg by mouth daily. Give 25mg  along with 100mg  to =125mg .      ROS was provided with assistance of staff.  Review of Systems  Constitutional: Negative for activity change, appetite change, chills, diaphoresis, fatigue, fever and unexpected weight change.  HENT: Positive for hearing loss. Negative for voice change.   Respiratory: Negative for cough, shortness of breath and wheezing.   Cardiovascular: Positive for leg swelling. Negative for chest pain and palpitations.  Gastrointestinal: Negative for abdominal distention, abdominal pain, constipation, diarrhea, nausea and vomiting.  Genitourinary: Negative  for difficulty urinating, dysuria and urgency.  Musculoskeletal: Positive for arthralgias and gait problem.  Skin: Positive for wound. Negative for color change.  Neurological: Negative for dizziness, speech difficulty, weakness and headaches.       Dementia  Psychiatric/Behavioral: Positive for confusion. Negative for agitation, behavioral problems, hallucinations and sleep disturbance. The patient is not nervous/anxious.     Immunization History  Administered Date(s) Administered  . Influenza Whole 10/19/2017  . Influenza-Unspecified 11/20/2013, 09/26/2014, 10/24/2015, 11/03/2016  . Tdap 09/19/2016  . Zoster 01/07/2012   Pertinent  Health Maintenance Due  Topic Date Due  . INFLUENZA VACCINE  08/07/2018  . DEXA SCAN  Discontinued   Fall Risk  09/15/2017 09/05/2016  Falls in the past year? No No   Functional Status Survey:    Vitals:   11/05/18 1207  BP: 114/62  Pulse: 84  Temp: (!) 96.9 F (36.1 C)   There is no height  or weight on file to calculate BMI. Physical Exam Vitals signs and nursing note reviewed.  Constitutional:      General: She is not in acute distress.    Appearance: Normal appearance. She is not ill-appearing, toxic-appearing or diaphoretic.  HENT:     Head: Normocephalic and atraumatic.     Nose: Nose normal.     Mouth/Throat:     Mouth: Mucous membranes are moist.  Eyes:     Extraocular Movements: Extraocular movements intact.     Conjunctiva/sclera: Conjunctivae normal.     Pupils: Pupils are equal, round, and reactive to light.  Neck:     Musculoskeletal: Normal range of motion and neck supple.  Cardiovascular:     Rate and Rhythm: Normal rate and regular rhythm.     Heart sounds: No murmur.  Pulmonary:     Breath sounds: No rhonchi or rales.  Abdominal:     General: Bowel sounds are normal. There is no distension.     Palpations: Abdomen is soft.     Tenderness: There is no abdominal tenderness. There is no right CVA tenderness, left CVA  tenderness, guarding or rebound.  Musculoskeletal:     Right lower leg: Edema present.     Left lower leg: Edema present.     Comments: Trace edema BLE  Skin:    General: Skin is warm and dry.     Comments:  R shin skin tear, steri strips closure, no s/s of infection.   Neurological:     General: No focal deficit present.     Mental Status: She is alert. Mental status is at baseline.     Cranial Nerves: No cranial nerve deficit.     Motor: No weakness.     Coordination: Coordination normal.     Gait: Gait abnormal.     Comments: Oriented to self  Psychiatric:        Mood and Affect: Mood normal.        Behavior: Behavior normal.     Labs reviewed: Recent Labs    07/25/18 1032 07/26/18 0331 08/03/18  NA 141 143 141  K 4.1 3.7 3.9  CL 110 113* 108  CO2 21* 22 23  GLUCOSE 108* 110*  --   BUN 9 7* 16  CREATININE 0.99 1.15* 1.1  CALCIUM 8.9 8.3* 8.7   Recent Labs    04/22/18 07/25/18 1032 07/26/18 0331  AST 14 22 26   ALT 16 21 19   ALKPHOS 84 88 82  BILITOT  --  0.4 0.5  PROT  --  6.0* 5.7*  ALBUMIN  --  3.4* 3.2*   Recent Labs    07/25/18 1032 07/26/18 0331 08/03/18  WBC 12.0* 7.7 7.1  HGB 15.0 13.2 14.3  HCT 47.5* 41.4 43  MCV 96.5 95.6  --   PLT 220 214 245   Lab Results  Component Value Date   TSH 1.32 02/16/2018   Lab Results  Component Value Date   HGBA1C 5.5 12/02/2016   Lab Results  Component Value Date   CHOL 174 10/21/2016   HDL 45 10/21/2016   LDLCALC 103 10/21/2016   TRIG 158 10/21/2016    Significant Diagnostic Results in last 30 days:  No results found.  Assessment/Plan: Skin tear of right lower leg without complication Obtained skin tear R shin during assisted transfer, intact steri strips closure, no s/s of infection. It should heal  Vascular dementia with behavior disturbance ([place]) Continue memory care unit [facility] for safety, care  assistance, she needs assistance with transfer, w/c for mobility.   Major depressive disorder,  recurrent ([place]) Her mood is maintained, continue Risperdal 0.25mg  bid, Sertraline 125mg  qd, Mirtazapine 7.5mg  qd.   Chronic anticoagulation  Hx of protein C/S deficiency, hx of DVT, s/p CVA, continue  Xarelto 15mg  po daily.      Family/ staff Communication: plan of care reviewed with the patient and charge nurse.   Labs/tests ordered: none  Time spend 25 minutes.

## 2018-11-05 NOTE — Assessment & Plan Note (Signed)
Obtained skin tear R shin during assisted transfer, intact steri strips closure, no s/s of infection. It should heal

## 2018-11-05 NOTE — Assessment & Plan Note (Signed)
Her mood is maintained, continue Risperdal 0.25mg  bid, Sertraline 125mg  qd, Mirtazapine 7.5mg  qd.

## 2018-11-05 NOTE — Assessment & Plan Note (Signed)
Continue memory care unit [facility] for safety, care assistance, she needs assistance with transfer, w/c for mobility.

## 2018-11-05 NOTE — Assessment & Plan Note (Signed)
Hx of protein C/S deficiency, hx of DVT, s/p CVA, continue  Xarelto 15mg  po daily.

## 2018-11-18 ENCOUNTER — Non-Acute Institutional Stay (SKILLED_NURSING_FACILITY): Payer: Medicare Other | Admitting: Internal Medicine

## 2018-11-18 ENCOUNTER — Encounter: Payer: Self-pay | Admitting: Internal Medicine

## 2018-11-18 DIAGNOSIS — K219 Gastro-esophageal reflux disease without esophagitis: Secondary | ICD-10-CM | POA: Diagnosis not present

## 2018-11-18 DIAGNOSIS — F0151 Vascular dementia with behavioral disturbance: Secondary | ICD-10-CM

## 2018-11-18 DIAGNOSIS — I10 Essential (primary) hypertension: Secondary | ICD-10-CM | POA: Diagnosis not present

## 2018-11-18 DIAGNOSIS — R569 Unspecified convulsions: Secondary | ICD-10-CM

## 2018-11-18 DIAGNOSIS — F01518 Vascular dementia, unspecified severity, with other behavioral disturbance: Secondary | ICD-10-CM

## 2018-11-18 NOTE — Progress Notes (Signed)
Location:    [facility]ursing Home Room Number: 109 Place of Service:  SNF (31) Provider:  Veleta Miners MD  Mast, Man X, NP  Patient Care Team: Mast, Man X, NP as PCP - General (Internal Medicine) Mast, Man X, NP as Nurse Practitioner (Internal Medicine) Juluis Rainier as Consulting Physician (Optometry) Monna Fam, MD as Consulting Physician (Ophthalmology) Lucky Cowboy, Gordy Clement, NP as Nurse Practitioner (Hospice and Palliative Medicine) Virgie Dad, MD as Consulting Physician (Internal Medicine)  Extended Emergency Contact Information Primary Emergency Contact: [facility] Address: [id number], [place] [address] [name] of [place] Phone: [phone] Work Phone: [phone] Mobile Phone: [phone] Relation: Daughter  Code Status:  DNR Goals of care: Advanced Directive information Advanced Directives 11/18/2018  Does Patient Have a Medical Advance Directive? Yes  Type of Advance Directive Out of facility DNR (pink MOST or yellow form);Healthcare Power of Attorney  Does patient want to make changes to medical advance directive? No - Patient declined  Copy of [place] in Chart? Yes - validated most recent copy scanned in chart (See row information)  Pre-existing out of facility DNR order (yellow form or pink MOST form) Yellow form placed in chart (order not valid for inpatient use)     Chief Complaint  Patient presents with  . Medical Management of Chronic Issues  . Health Maintenance    Influenza vaccine    HPI:  Pt is a 85 y.o. female seen today for medical management of chronic diseases.    Patient has a history of CVA, vascular dementia with behavior issues, hypertension history of DVT on chronic Xarelto. H/o Seizures Patient  L[facility]  in a  [facility] Patient seen for regular visit Per nurses patient does have some behavior issues mostly in the evening.  Sometimes she resists care.  Her weight is stable no new nursing issues. Patient did not have any complaints today.  Her appetite is good  Past Medical History:  Diagnosis Date  . Acute encephalopathy   . Aphasia following unspecified cerebrovascular disease   . 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 Depression   . Dysphagia following cerebrovascular disease   . Hepatic steatosis   . Hydroureteronephrosis   . Hypertension   . Insomnia   . Macular degeneration   . Major depressive disorder, recurrent ([place])   . Neuralgia and neuritis, unspecified (CODE)   . Nontraumatic intracranial hemorrhage ([place])   . Stroke ([place])   . 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   . 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 ABDOMINAL HYSTERECTOMY    . APPENDECTOMY    . BREAST SURGERY    . MASTECTOMY Right     Allergies  Allergen Reactions  . Hydrocodone Nausea And Vomiting  . Morphine And Related Nausea And Vomiting  . Actonel [Risedronate Sodium] Other (See Comments)    unknown  . Darvon [Propoxyphene Hcl] Other (See Comments)    unknown  . Oxycodone Hcl Other (See Comments)    unknown  . Pneumovax [Pneumococcal Polysaccharide Vaccine] Other (See Comments)    unknown    Allergies as of 11/18/2018      Reactions   Hydrocodone Nausea And Vomiting   Morphine And Related Nausea And Vomiting   Actonel [risedronate Sodium] Other (  See Comments)   unknown   Darvon [propoxyphene Hcl] Other (See Comments)   unknown   Oxycodone Hcl Other (See Comments)   unknown   Pneumovax [pneumococcal Polysaccharide Vaccine] Other (See Comments)   unknown      Medication List       Accurate as of November 18, 2018  8:37 AM. If you have any questions, ask your nurse or doctor.        acetaminophen 325 MG tablet Commonly known as: TYLENOL Take 650 mg by mouth every 4 (four) hours as needed.   bisacodyl 10  MG suppository Commonly known as: DULCOLAX Place 10 mg rectally daily as needed for mild constipation or moderate constipation.   GAS RELIEF 80 PO Take 80 mg by mouth 3 (three) times daily with meals.   hydrocortisone 2.5 % lotion Apply topically 2 (two) times daily as needed. Apply to itchy  area on face.   levETIRAcetam 100 MG/ML solution Commonly known as: KEPPRA 250 mg = 2.5 ML; oral Twice A Day   loratadine 10 MG tablet Commonly known as: CLARITIN Take 10 mg by mouth daily.   metoprolol tartrate 25 MG tablet Commonly known as: LOPRESSOR Take 12.5 mg by mouth 2 (two) times daily.   mirtazapine 15 MG tablet Commonly known as: REMERON Take 7.5 mg by mouth at bedtime.   NON FORMULARY MAGIC CUP WITH LUNCH AND DINNER   nystatin powder Generic drug: nystatin Apply topically daily as needed. Under left breast   omeprazole 10 MG capsule Commonly known as: PRILOSEC Take 10 mg by mouth daily.   polyethylene glycol 17 g packet Commonly known as: MIRALAX / GLYCOLAX Take 17 g by mouth daily.   risperiDONE 0.25 MG tablet Commonly known as: RISPERDAL Take 0.25 mg by mouth 2 (two) times daily.   Rivaroxaban 15 MG Tabs tablet Commonly known as: XARELTO Take 1 tablet (15 mg total) by mouth daily at 6 PM.   sertraline 100 MG tablet Commonly known as: ZOLOFT Take 100 mg by mouth at bedtime. Take with 25 mg tablet   sertraline 25 MG tablet Commonly known as: ZOLOFT Take 25 mg by mouth daily. Give 25mg  along with 100mg  to =125mg .       Review of Systems  Unable to perform ROS: Dementia    Immunization History  Administered Date(s) Administered  . Influenza Whole 10/19/2017  . Influenza-Unspecified 11/20/2013, 09/26/2014, 10/24/2015, 11/03/2016  . Tdap 09/19/2016  . Zoster 01/07/2012   Pertinent  Health Maintenance Due  Topic Date Due  . INFLUENZA VACCINE  08/07/2018  . DEXA SCAN  Discontinued   Fall Risk  09/15/2017 09/05/2016  Falls in the past year? No No    Functional Status Survey:    Vitals:   11/18/18 0830  BP: 110/64  Pulse: 74  Resp: 20  Temp: (!) 97 F (36.1 C)  SpO2: 95%  Weight: 158 lb 9.6 oz (71.9 kg)  Height: 5\' 3"  (1.6 m)   Body mass index is 28.09 kg/m. Physical Exam Vitals signs reviewed.  Constitutional:      Appearance: Normal appearance.  HENT:     Head: Normocephalic.     Nose: Nose normal.     Mouth/Throat:     Mouth: Mucous membranes are moist.     Pharynx: Oropharynx is clear.  Eyes:     Pupils: Pupils are equal, round, and reactive to light.  Neck:     Musculoskeletal: Neck supple.  Cardiovascular:     Rate and Rhythm: Normal  rate and regular rhythm.     Pulses: Normal pulses.  Pulmonary:     Effort: Pulmonary effort is normal. No respiratory distress.     Breath sounds: Normal breath sounds.  Abdominal:     General: Abdomen is flat. Bowel sounds are normal.     Palpations: Abdomen is soft.  Musculoskeletal:        General: No swelling.  Skin:    General: Skin is warm.  Neurological:     General: No focal deficit present.     Mental Status: She is alert.  Psychiatric:        Mood and Affect: Mood normal.        Thought Content: Thought content normal.        Judgment: Judgment normal.     Labs reviewed: Recent Labs    07/25/18 1032 07/26/18 0331 08/03/18  NA 141 143 141  K 4.1 3.7 3.9  CL 110 113* 108  CO2 21* 22 23  GLUCOSE 108* 110*  --   BUN 9 7* 16  CREATININE 0.99 1.15* 1.1  CALCIUM 8.9 8.3* 8.7   Recent Labs    04/22/18 07/25/18 1032 07/26/18 0331  AST 14 22 26   ALT 16 21 19   ALKPHOS 84 88 82  BILITOT  --  0.4 0.5  PROT  --  6.0* 5.7*  ALBUMIN  --  3.4* 3.2*   Recent Labs    07/25/18 1032 07/26/18 0331 08/03/18  WBC 12.0* 7.7 7.1  HGB 15.0 13.2 14.3  HCT 47.5* 41.4 43  MCV 96.5 95.6  --   PLT 220 214 245   Lab Results  Component Value Date   TSH 1.32 02/16/2018   Lab Results  Component Value Date   HGBA1C 5.5 12/02/2016   Lab Results  Component  Value Date   CHOL 174 10/21/2016   HDL 45 10/21/2016   LDLCALC 103 10/21/2016   TRIG 158 10/21/2016    Significant Diagnostic Results in last 30 days:  No results found.  Assessment/Plan  Seizure ([place]) On Keppra  Doing well  Vascular dementia with behavior disturbance ([place]) Controlled on Resperidal  Essential hypertension, benign' On metoprolol  H/o CVA On Xarelto BP is controlled Depression On Zoloft and Remeron GERD On Omeprzole ACP Family Planning to make patient Comfort care Would not see need for hospice yet  Family/ staff Communication:   Labs/tests ordered:  CBC,CMP  Total time spent in this patient care encounter was  25_  minutes; greater than 50% of the visit spent counseling patient and staff, reviewing records , Labs and coordinating care for problems addressed at this encounter.

## 2018-12-20 ENCOUNTER — Encounter: Payer: Self-pay | Admitting: Nurse Practitioner

## 2018-12-20 ENCOUNTER — Non-Acute Institutional Stay (SKILLED_NURSING_FACILITY): Payer: Medicare Other | Admitting: Nurse Practitioner

## 2018-12-20 DIAGNOSIS — I10 Essential (primary) hypertension: Secondary | ICD-10-CM

## 2018-12-20 DIAGNOSIS — F0151 Vascular dementia with behavioral disturbance: Secondary | ICD-10-CM

## 2018-12-20 DIAGNOSIS — F330 Major depressive disorder, recurrent, mild: Secondary | ICD-10-CM

## 2018-12-20 DIAGNOSIS — F29 Unspecified psychosis not due to a substance or known physiological condition: Secondary | ICD-10-CM

## 2018-12-20 DIAGNOSIS — F01518 Vascular dementia, unspecified severity, with other behavioral disturbance: Secondary | ICD-10-CM

## 2018-12-20 DIAGNOSIS — R569 Unspecified convulsions: Secondary | ICD-10-CM

## 2018-12-20 DIAGNOSIS — Z7901 Long term (current) use of anticoagulants: Secondary | ICD-10-CM

## 2018-12-20 NOTE — Assessment & Plan Note (Signed)
Her mood is stable, continue Sertraline, Mirtazapine.  

## 2018-12-20 NOTE — Assessment & Plan Note (Signed)
She has protein C/S deficiency, hx of DVT, s/p CVA, on Xarelto 15mg  po daily.

## 2018-12-20 NOTE — Assessment & Plan Note (Signed)
Continue memory care unit [facility] for safety, care assistance.

## 2018-12-20 NOTE — Assessment & Plan Note (Signed)
Blood pressure is controlled, continue Metoprolol. 

## 2018-12-20 NOTE — Progress Notes (Addendum)
Location:   [place] Room Number: 109 Place of Service:  SNF (31) Provider:  Bhavin Monjaraz X, NP  Ladene Allocca X, NP  Patient Care Team: Soundra Lampley X, NP as PCP - General (Internal Medicine) Wessley Emert X, NP as Nurse Practitioner (Internal Medicine) Idolina Primer, Warnell Bureau as Consulting Physician (Optometry) Monna Fam, MD as Consulting Physician (Ophthalmology) Lucky Cowboy, Gordy Clement, NP as Nurse Practitioner (Hospice and Palliative Medicine) Virgie Dad, MD as Consulting Physician (Internal Medicine)  Extended Emergency Contact Information Primary Emergency Contact: [facility] Address: [id number] [place], [place] [address] [name] of [place] Phone: [phone] Work Phone: [phone] Mobile Phone: [phone] Relation: Daughter  Code Status:  DNR Goals of care: Advanced Directive information Advanced Directives 12/20/2018  Does Patient Have a Medical Advance Directive? Yes  Type of Advance Directive Out of facility DNR (pink MOST or yellow form)  Does patient want to make changes to medical advance directive? No - Patient declined  Copy of [place] in Chart? Yes - validated most recent copy scanned in chart (See row information)  Pre-existing out of facility DNR order (yellow form or pink MOST form) Yellow form placed in chart (order not valid for inpatient use)     Chief Complaint  Patient presents with  . Medical Management of Chronic Issues    HPI:  Pt is a 85 y.o. female seen today for medical management of chronic diseases.    The patient resides in [place] unit [facility] for safety, care assistance, she is a total dependent of ADLs including feeing. Her mood is stable, on Sertraline 125mg  qd, Risperidone 0.25mg  bid, Mirtazapine 7.5mg  qd. Hx of stroke, protein C/S deficiency, on Xarelto 15mg  qd. HTN, blood pressure is controlled on Metoprolol 12.5mg  bid. Seizure, stable, on Keppra 250mg  bid.    Past Medical History:  Diagnosis Date  . Acute encephalopathy   . Aphasia following unspecified cerebrovascular disease   . 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 Depression   . Dysphagia following cerebrovascular disease   . Hepatic steatosis   . Hydroureteronephrosis   . Hypertension   . Insomnia   . Macular degeneration   . Major depressive disorder, recurrent ([place])   . Neuralgia and neuritis, unspecified (CODE)   . Nontraumatic intracranial hemorrhage ([place])   . Stroke ([place])   . 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   . 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 ABDOMINAL HYSTERECTOMY    . APPENDECTOMY    . BREAST SURGERY    . MASTECTOMY Right     Allergies  Allergen Reactions  . Hydrocodone Nausea And Vomiting  . Morphine And Related Nausea And Vomiting  . Actonel [Risedronate Sodium] Other (See Comments)    unknown  . Darvon [Propoxyphene Hcl] Other (See Comments)    unknown  . Oxycodone Hcl Other (See Comments)    unknown  . Pneumovax [Pneumococcal Polysaccharide Vaccine] Other (See Comments)    unknown    Allergies as of 12/20/2018      Reactions   Hydrocodone Nausea And Vomiting   Morphine And Related Nausea And Vomiting   Actonel [risedronate Sodium] Other (See Comments)   unknown   Darvon [propoxyphene Hcl] Other (See Comments)   unknown   Oxycodone Hcl Other (See Comments)  unknown   Pneumovax [pneumococcal Polysaccharide Vaccine] Other (See Comments)   unknown      Medication List       Accurate as of December 20, 2018 11:59 PM. If you have any questions, ask your nurse or doctor.        acetaminophen 325 MG tablet Commonly known as: TYLENOL Take 650 mg by mouth every 4 (four) hours as needed.   bisacodyl 10 MG suppository Commonly known as: DULCOLAX Place 10 mg rectally daily as needed for mild constipation or  moderate constipation.   GAS RELIEF 80 PO Take 80 mg by mouth 3 (three) times daily with meals.   hydrocortisone 2.5 % lotion Apply topically 2 (two) times daily as needed. Apply to itchy  area on face.   levETIRAcetam 100 MG/ML solution Commonly known as: KEPPRA 250 mg = 2.5 ML; oral Twice A Day   loratadine 10 MG tablet Commonly known as: CLARITIN Take 10 mg by mouth daily.   metoprolol tartrate 25 MG tablet Commonly known as: LOPRESSOR Take 12.5 mg by mouth 2 (two) times daily.   mirtazapine 15 MG tablet Commonly known as: REMERON Take 7.5 mg by mouth at bedtime.   NON FORMULARY MAGIC CUP WITH LUNCH AND DINNER   nystatin powder Generic drug: nystatin Apply topically daily as needed. Under left breast   omeprazole 10 MG capsule Commonly known as: PRILOSEC Take 10 mg by mouth daily.   polyethylene glycol 17 g packet Commonly known as: MIRALAX / GLYCOLAX Take 17 g by mouth daily.   risperiDONE 0.25 MG tablet Commonly known as: RISPERDAL Take 0.25 mg by mouth 2 (two) times daily.   Rivaroxaban 15 MG Tabs tablet Commonly known as: XARELTO Take 1 tablet (15 mg total) by mouth daily at 6 PM.   sertraline 100 MG tablet Commonly known as: ZOLOFT Take 100 mg by mouth at bedtime. Take with 25 mg tablet   sertraline 25 MG tablet Commonly known as: ZOLOFT Take 25 mg by mouth daily. Give 25mg  along with 100mg  to =125mg .      ROS was provided with assistance of staff.  Review of Systems  Constitutional: Negative for activity change, appetite change, chills, diaphoresis, fatigue, fever and unexpected weight change.  HENT: Positive for hearing loss. Negative for congestion and voice change.   Eyes: Negative for visual disturbance.  Respiratory: Negative for cough, shortness of breath and wheezing.   Cardiovascular: Positive for leg swelling. Negative for chest pain and palpitations.  Gastrointestinal: Negative for abdominal distention, abdominal pain, constipation,  diarrhea, nausea and vomiting.  Genitourinary: Negative for difficulty urinating, dysuria, frequency and urgency.  Musculoskeletal: Positive for gait problem.  Skin: Negative for color change and pallor.  Neurological: Negative for dizziness, speech difficulty, weakness and headaches.       Dementia, unable to voice her needs, total care of ADLs  Psychiatric/Behavioral: Positive for confusion. Negative for agitation, behavioral problems, hallucinations and sleep disturbance. The patient is not nervous/anxious.     Immunization History  Administered Date(s) Administered  . Influenza Whole 10/19/2017  . Influenza, High Dose Seasonal PF 10/08/2018  . Influenza-Unspecified 11/20/2013, 09/26/2014, 10/24/2015, 11/03/2016  . Tdap 09/19/2016  . Zoster 01/07/2012   Pertinent  Health Maintenance Due  Topic Date Due  . INFLUENZA VACCINE  Completed  . DEXA SCAN  Discontinued   Fall Risk  09/15/2017 09/05/2016  Falls in the past year? No No   Functional Status Survey:    Vitals:   12/20/18 1005  BP: 124/64  Pulse: 80  Resp: (!) 22  Temp: (!) 97.3 F (36.3 C)  SpO2: 92%  Weight: 157 lb 3.2 oz (71.3 kg)  Height: 5\' 3"  (1.6 m)   Body mass index is 27.85 kg/m. Physical Exam Vitals and nursing note reviewed.  Constitutional:      General: She is not in acute distress.    Appearance: Normal appearance. She is not ill-appearing, toxic-appearing or diaphoretic.     Comments: Over weight  HENT:     Head: Normocephalic and atraumatic.     Nose: Nose normal.     Mouth/Throat:     Mouth: Mucous membranes are moist.  Eyes:     Extraocular Movements: Extraocular movements intact.     Conjunctiva/sclera: Conjunctivae normal.     Pupils: Pupils are equal, round, and reactive to light.  Cardiovascular:     Rate and Rhythm: Normal rate and regular rhythm.     Heart sounds: No murmur.  Pulmonary:     Breath sounds: Rales present. No wheezing or rhonchi.     Comments: Bibasilar rales.    Abdominal:     General: Bowel sounds are normal. There is no distension.     Palpations: Abdomen is soft.     Tenderness: There is no abdominal tenderness. There is no right CVA tenderness, left CVA tenderness, guarding or rebound.  Musculoskeletal:     Cervical back: Normal range of motion and neck supple.     Right lower leg: Edema present.     Left lower leg: Edema present.     Comments: Trace edema BLE  Skin:    General: Skin is warm and dry.  Neurological:     General: No focal deficit present.     Mental Status: She is alert. Mental status is at baseline.     Motor: No weakness.     Coordination: Coordination normal.     Gait: Gait abnormal.     Comments: Oriented to self.   Psychiatric:        Mood and Affect: Mood normal.        Behavior: Behavior normal.     Comments: Unable to voice her needs.      Labs reviewed: Recent Labs    07/25/18 1032 07/26/18 0331 08/03/18 0000  NA 141 143 141  K 4.1 3.7 3.9  CL 110 113* 108  CO2 21* 22 23  GLUCOSE 108* 110*  --   BUN 9 7* 16  CREATININE 0.99 1.15* 1.1  CALCIUM 8.9 8.3* 8.7   Recent Labs    04/22/18 0000 07/25/18 1032 07/26/18 0331  AST 14 22 26   ALT 16 21 19   ALKPHOS 84 88 82  BILITOT  --  0.4 0.5  PROT  --  6.0* 5.7*  ALBUMIN  --  3.4* 3.2*   Recent Labs    07/25/18 1032 07/26/18 0331 08/03/18 0000  WBC 12.0* 7.7 7.1  HGB 15.0 13.2 14.3  HCT 47.5* 41.4 43  MCV 96.5 95.6  --   PLT 220 214 245   Lab Results  Component Value Date   TSH 1.32 02/16/2018   Lab Results  Component Value Date   HGBA1C 5.5 12/02/2016   Lab Results  Component Value Date   CHOL 174 10/21/2016   HDL 45 10/21/2016   LDLCALC 103 10/21/2016   TRIG 158 10/21/2016    Significant Diagnostic Results in last 30 days:  No results found.  Assessment/Plan Seizure (HCC) Stable, continue Keppra 250mg  bid.  Psychosis Stable, attempt GDR  Risperidone 0.25mg  qd if HPOA consents.   Major depressive disorder, recurrent  ([place]) Her mood is stable, continue Sertraline, Mirtazapine.   Vascular dementia with behavior disturbance ([place]) Continue memory care unit [facility] for safety, care assistance.   Essential hypertension, benign Blood pressure is controlled, continue Metoprolol   Chronic anticoagulation She has protein C/S deficiency, hx of DVT, s/p CVA, on Xarelto 15mg  po daily.      Family/ staff Communication: plan of care reviewed with the patient and charge nurse.   Labs/tests ordered:  none  Time spend 25 minutes.

## 2018-12-20 NOTE — Assessment & Plan Note (Addendum)
Stable, attempt GDR  Risperidone 0.25mg  qd if HPOA consents.

## 2018-12-20 NOTE — Assessment & Plan Note (Signed)
Stable, continue Keppra 250mg  bid.

## 2019-01-10 DIAGNOSIS — Z03818 Encounter for observation for suspected exposure to other biological agents ruled out: Secondary | ICD-10-CM | POA: Diagnosis not present

## 2019-01-17 DIAGNOSIS — Z20828 Contact with and (suspected) exposure to other viral communicable diseases: Secondary | ICD-10-CM | POA: Diagnosis not present

## 2019-01-19 ENCOUNTER — Non-Acute Institutional Stay (SKILLED_NURSING_FACILITY): Payer: Medicare PPO | Admitting: Nurse Practitioner

## 2019-01-19 ENCOUNTER — Encounter: Payer: Self-pay | Admitting: Nurse Practitioner

## 2019-01-19 DIAGNOSIS — R627 Adult failure to thrive: Secondary | ICD-10-CM | POA: Diagnosis not present

## 2019-01-19 DIAGNOSIS — K5901 Slow transit constipation: Secondary | ICD-10-CM | POA: Diagnosis not present

## 2019-01-19 DIAGNOSIS — R569 Unspecified convulsions: Secondary | ICD-10-CM | POA: Diagnosis not present

## 2019-01-19 DIAGNOSIS — F0151 Vascular dementia with behavioral disturbance: Secondary | ICD-10-CM

## 2019-01-19 DIAGNOSIS — R443 Hallucinations, unspecified: Secondary | ICD-10-CM

## 2019-01-19 DIAGNOSIS — K219 Gastro-esophageal reflux disease without esophagitis: Secondary | ICD-10-CM | POA: Diagnosis not present

## 2019-01-19 DIAGNOSIS — I10 Essential (primary) hypertension: Secondary | ICD-10-CM | POA: Diagnosis not present

## 2019-01-19 DIAGNOSIS — F01518 Vascular dementia, unspecified severity, with other behavioral disturbance: Secondary | ICD-10-CM

## 2019-01-19 DIAGNOSIS — I8291 Chronic embolism and thrombosis of unspecified vein: Secondary | ICD-10-CM

## 2019-01-19 NOTE — Progress Notes (Signed)
Location:   [place] Room Number: 109 Place of Service:  SNF (31) Provider: Alexes Lamarque X, NP  Shandell Giovanni X, NP  Patient Care Team: Muntaha Vermette X, NP as PCP - General (Internal Medicine) Clebert Wenger X, NP as Nurse Practitioner (Internal Medicine) Idolina Primer, Warnell Bureau as Consulting Physician (Optometry) Monna Fam, MD as Consulting Physician (Ophthalmology) Lucky Cowboy, Gordy Clement, NP as Nurse Practitioner (Hospice and Palliative Medicine) Virgie Dad, MD as Consulting Physician (Internal Medicine)  Extended Emergency Contact Information Primary Emergency Contact: [facility] Address: [id number] [place], [place] [address] [name] of [place] Phone: [phone] Work Phone: [phone] Mobile Phone: [phone] Relation: Daughter  Code Status:  DNR Goals of care: Advanced Directive information Advanced Directives 01/19/2019  Does Patient Have a Medical Advance Directive? Yes  Type of Paramedic of [place];Living will;Out of facility DNR (pink MOST or yellow form)  Does patient want to make changes to medical advance directive? No - Patient declined  Copy of [place] in Chart? Yes - validated most recent copy scanned in chart (See row information)  Pre-existing out of facility DNR order (yellow form or pink MOST form) Yellow form placed in chart (order not valid for inpatient use)     Chief Complaint  Patient presents with  . Medical Management of Chronic Issues    HPI:  Pt is a 86 y.o. female seen today for medical management of chronic diseases.    The patient resides in Memory care unit [facility] for safety, care assistance, w/c for mobility. Her mood is stable, on Sertraline 125mg  qd, Risperidone 0.25mg  qd, weight is stabilized, on  Mirtazapine 7.5mg  qd. Seizure, stable, on  Keppra 250mg  bid. Hx of VTE, protein c/s deficiency, on Xarelto 15mg  qd. Constipation, stable, on MiraLax  qd. GERD, stable, on Omeprazole 10mg  qd. HTN, blood pressure is controlled on Metoprolol 12.5mg  bid.    Past Medical History:  Diagnosis Date  . Acute encephalopathy   . Aphasia following unspecified cerebrovascular disease   . 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 Depression   . Dysphagia following cerebrovascular disease   . Hepatic steatosis   . Hydroureteronephrosis   . Hypertension   . Insomnia   . Macular degeneration   . Major depressive disorder, recurrent ([place])   . Neuralgia and neuritis, unspecified (CODE)   . Nontraumatic intracranial hemorrhage ([place])   . Stroke ([place])   . 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   . 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 ABDOMINAL HYSTERECTOMY    . APPENDECTOMY    . BREAST SURGERY    . MASTECTOMY Right     Allergies  Allergen Reactions  . Hydrocodone Nausea And Vomiting  . Morphine And Related Nausea And Vomiting  . Actonel [Risedronate Sodium] Other (See Comments)    unknown  . Darvon [Propoxyphene Hcl] Other (See Comments)    unknown  . Oxycodone Hcl Other (See Comments)    unknown  . Pneumovax [Pneumococcal Polysaccharide Vaccine] Other (See Comments)    unknown    Allergies as of 01/19/2019      Reactions   Hydrocodone Nausea And Vomiting   Morphine And Related Nausea And Vomiting   Actonel [risedronate Sodium] Other (See Comments)   unknown   Darvon [propoxyphene  Hcl] Other (See Comments)   unknown   Oxycodone Hcl Other (See Comments)   unknown   Pneumovax [pneumococcal Polysaccharide Vaccine] Other (See Comments)   unknown      Medication List       Accurate as of January 19, 2019  4:33 PM. If you have any questions, ask your nurse or doctor.        acetaminophen 325 MG tablet Commonly known as: TYLENOL Take 650 mg by mouth every 4 (four) hours as needed.   bisacodyl 10 MG  suppository Commonly known as: DULCOLAX Place 10 mg rectally daily as needed for mild constipation or moderate constipation.   GAS RELIEF 80 PO Take 80 mg by mouth 3 (three) times daily with meals.   hydrocortisone 2.5 % lotion Apply topically 2 (two) times daily as needed. Apply to itchy  area on face.   levETIRAcetam 100 MG/ML solution Commonly known as: KEPPRA 250 mg = 2.5 ML; oral Twice A Day   loratadine 10 MG tablet Commonly known as: CLARITIN Take 10 mg by mouth daily.   metoprolol tartrate 25 MG tablet Commonly known as: LOPRESSOR Take 12.5 mg by mouth 2 (two) times daily.   mirtazapine 15 MG tablet Commonly known as: REMERON Take 7.5 mg by mouth at bedtime.   NON FORMULARY MAGIC CUP WITH LUNCH AND DINNER   nystatin powder Generic drug: nystatin Apply topically daily as needed. Under left breast   omeprazole 10 MG capsule Commonly known as: PRILOSEC Take 10 mg by mouth daily.   polyethylene glycol 17 g packet Commonly known as: MIRALAX / GLYCOLAX Take 17 g by mouth daily.   risperiDONE 0.25 MG tablet Commonly known as: RISPERDAL Take 0.25 mg by mouth daily.   Rivaroxaban 15 MG Tabs tablet Commonly known as: XARELTO Take 1 tablet (15 mg total) by mouth daily at 6 PM.   sertraline 100 MG tablet Commonly known as: ZOLOFT Take 100 mg by mouth at bedtime. Take with 25 mg tablet   sertraline 25 MG tablet Commonly known as: ZOLOFT Take 25 mg by mouth daily. Give 25mg  along with 100mg  to =125mg .      ROS was provided with assistance of staff.  Review of Systems  Constitutional: Negative for activity change, appetite change, chills, diaphoresis, fatigue, fever and unexpected weight change.  HENT: Positive for hearing loss. Negative for congestion and voice change.   Eyes: Negative for visual disturbance.  Respiratory: Negative for cough, shortness of breath and wheezing.   Cardiovascular: Positive for leg swelling.  Gastrointestinal: Negative for  abdominal distention, abdominal pain, constipation, diarrhea, nausea and vomiting.  Genitourinary: Negative for difficulty urinating, dysuria and urgency.  Musculoskeletal: Positive for arthralgias and gait problem.  Skin: Negative for color change and pallor.  Neurological: Negative for dizziness, tremors, seizures, facial asymmetry, speech difficulty, weakness and headaches.       Dementia.   Psychiatric/Behavioral: Positive for confusion. Negative for agitation, behavioral problems, hallucinations and sleep disturbance. The patient is not nervous/anxious.     Immunization History  Administered Date(s) Administered  . Influenza Whole 10/19/2017  . Influenza, High Dose Seasonal PF 10/08/2018  . Influenza-Unspecified 11/20/2013, 09/26/2014, 10/24/2015, 11/03/2016  . Tdap 09/19/2016  . Zoster 01/07/2012   Pertinent  Health Maintenance Due  Topic Date Due  . INFLUENZA VACCINE  Completed  . DEXA SCAN  Discontinued   Fall Risk  09/15/2017 09/05/2016  Falls in the past year? No No   Functional Status Survey:    Vitals:  01/19/19 0836  BP: 120/70  Pulse: 83  Resp: 18  Temp: (!) 97.3 F (36.3 C)  SpO2: 95%  Weight: 157 lb 4.8 oz (71.4 kg)  Height: 5\' 3"  (1.6 m)   Body mass index is 27.86 kg/m. Physical Exam Vitals and nursing note reviewed.  Constitutional:      General: She is not in acute distress.    Appearance: Normal appearance. She is not ill-appearing, toxic-appearing or diaphoretic.     Comments: Over weight.   HENT:     Head: Normocephalic and atraumatic.     Nose: Nose normal.     Mouth/Throat:     Mouth: Mucous membranes are moist.  Eyes:     Extraocular Movements: Extraocular movements intact.     Conjunctiva/sclera: Conjunctivae normal.     Pupils: Pupils are equal, round, and reactive to light.  Cardiovascular:     Rate and Rhythm: Normal rate and regular rhythm.     Heart sounds: No murmur.  Pulmonary:     Effort: Pulmonary effort is normal.      Breath sounds: No wheezing, rhonchi or rales.  Abdominal:     General: Bowel sounds are normal. There is no distension.     Palpations: Abdomen is soft.     Tenderness: There is no abdominal tenderness. There is no right CVA tenderness, left CVA tenderness, guarding or rebound.  Musculoskeletal:     Right lower leg: Edema present.     Left lower leg: Edema present.     Comments: Trace edema BLE  Skin:    General: Skin is warm and dry.  Neurological:     General: No focal deficit present.     Mental Status: She is alert. Mental status is at baseline.     Motor: No weakness.     Coordination: Coordination normal.     Gait: Gait abnormal.     Comments: Oriented to self.   Psychiatric:     Comments: Smiled, answered yes or no questions during today's examination.      Labs reviewed: Recent Labs    07/25/18 1032 07/26/18 0331 08/03/18 0000  NA 141 143 141  K 4.1 3.7 3.9  CL 110 113* 108  CO2 21* 22 23  GLUCOSE 108* 110*  --   BUN 9 7* 16  CREATININE 0.99 1.15* 1.1  CALCIUM 8.9 8.3* 8.7   Recent Labs    04/22/18 0000 07/25/18 1032 07/26/18 0331  AST 14 22 26   ALT 16 21 19   ALKPHOS 84 88 82  BILITOT  --  0.4 0.5  PROT  --  6.0* 5.7*  ALBUMIN  --  3.4* 3.2*   Recent Labs    07/25/18 1032 07/26/18 0331 08/03/18 0000  WBC 12.0* 7.7 7.1  HGB 15.0 13.2 14.3  HCT 47.5* 41.4 43  MCV 96.5 95.6  --   PLT 220 214 245   Lab Results  Component Value Date   TSH 1.32 02/16/2018   Lab Results  Component Value Date   HGBA1C 5.5 12/02/2016   Lab Results  Component Value Date   CHOL 174 10/21/2016   HDL 45 10/21/2016   LDLCALC 103 10/21/2016   TRIG 158 10/21/2016    Significant Diagnostic Results in last 30 days:  No results found.  Assessment/Plan Chronic embolism and thrombosis of unspecified vein Long term anticoagulation therapy in setting of protein c/s deficiency, continue Xarelto.   Essential hypertension, benign Blood pressure is controlled,  continue Metoprolol.   GERD (  gastroesophageal reflux disease) Stable, continue Omeprazole.   Vascular dementia with behavior disturbance ([place]) Continue memory care unit [facility] for safety, care assistance, w/c for mobility.   Hallucination Stable, continue Risperidone, Sertraline.   Seizure ([place]) Stable, continue Keppra.   Adult failure to thrive Weight has stabilized, continue Mirtazapine for now.   Slow transit constipation Stable, continue MiraLax.      Family/ staff Communication: plan of care reviewed with the patient and charge nurse.   Labs/tests ordered:  none  Time spend 25 minutes.

## 2019-01-19 NOTE — Assessment & Plan Note (Signed)
Continue memory care unit [facility] for safety, care assistance, w/c for mobility.

## 2019-01-19 NOTE — Assessment & Plan Note (Signed)
Stable, continue MiraLax.  °

## 2019-01-19 NOTE — Assessment & Plan Note (Signed)
Blood pressure is controlled, continue Metoprolol. 

## 2019-01-19 NOTE — Assessment & Plan Note (Signed)
Stable, continue Omeprazole.  

## 2019-01-19 NOTE — Assessment & Plan Note (Signed)
Stable, continue Keppra 

## 2019-01-19 NOTE — Assessment & Plan Note (Signed)
Stable, continue Risperidone, Sertraline.

## 2019-01-19 NOTE — Assessment & Plan Note (Signed)
Long term anticoagulation therapy in setting of protein c/s deficiency, continue Xarelto.

## 2019-01-19 NOTE — Assessment & Plan Note (Signed)
Weight has stabilized, continue Mirtazapine for now.

## 2019-01-24 DIAGNOSIS — Z20828 Contact with and (suspected) exposure to other viral communicable diseases: Secondary | ICD-10-CM | POA: Diagnosis not present

## 2019-01-26 ENCOUNTER — Non-Acute Institutional Stay (SKILLED_NURSING_FACILITY): Payer: Medicare PPO | Admitting: Nurse Practitioner

## 2019-01-26 ENCOUNTER — Encounter: Payer: Self-pay | Admitting: Nurse Practitioner

## 2019-01-26 DIAGNOSIS — F0151 Vascular dementia with behavioral disturbance: Secondary | ICD-10-CM | POA: Diagnosis not present

## 2019-01-26 DIAGNOSIS — F01518 Vascular dementia, unspecified severity, with other behavioral disturbance: Secondary | ICD-10-CM

## 2019-01-26 DIAGNOSIS — R269 Unspecified abnormalities of gait and mobility: Secondary | ICD-10-CM | POA: Diagnosis not present

## 2019-01-26 DIAGNOSIS — R32 Unspecified urinary incontinence: Secondary | ICD-10-CM | POA: Diagnosis not present

## 2019-01-26 DIAGNOSIS — I10 Essential (primary) hypertension: Secondary | ICD-10-CM | POA: Diagnosis not present

## 2019-01-26 DIAGNOSIS — R21 Rash and other nonspecific skin eruption: Secondary | ICD-10-CM | POA: Insufficient documentation

## 2019-01-26 DIAGNOSIS — F29 Unspecified psychosis not due to a substance or known physiological condition: Secondary | ICD-10-CM | POA: Diagnosis not present

## 2019-01-26 DIAGNOSIS — L740 Miliaria rubra: Secondary | ICD-10-CM | POA: Diagnosis not present

## 2019-01-26 NOTE — Assessment & Plan Note (Signed)
Mid to lower back about her hand sized clustered satellite in pattern rash, heat and moist from the top of her adult depend and back of w/c are contributory, will apply 0.5% Triamcinolone cream, Nystatin cream bid x 7 days. Observe.

## 2019-01-26 NOTE — Assessment & Plan Note (Signed)
Her mood is stable, continue Sertraline, Risperidone.

## 2019-01-26 NOTE — Assessment & Plan Note (Signed)
Continue memory care unit [facility] for safety, care assistance.

## 2019-01-26 NOTE — Assessment & Plan Note (Signed)
W/c for mobility °

## 2019-01-26 NOTE — Assessment & Plan Note (Signed)
Blood pressure is controlled, continue Metoprolol. 

## 2019-01-26 NOTE — Progress Notes (Signed)
Location:   [facility] Room Number: [place]:  SNF (31) Provider:  Loise Esguerra, NP  Wesam Gearhart X, NP  Patient Care Team: Herchel Hopkin X, NP as PCP - General (Internal Medicine) Tresha Muzio X, NP as Nurse Practitioner (Internal Medicine) Idolina Primer, Warnell Bureau as Consulting Physician (Optometry) Monna Fam, MD as Consulting Physician (Ophthalmology) Lucky Cowboy, Gordy Clement, NP as Nurse Practitioner (Hospice and Palliative Medicine) Virgie Dad, MD as Consulting Physician (Internal Medicine)  Extended Emergency Contact Information Primary Emergency Contact: [facility] Address: [id number], [place] [address] [name] of [place] Phone: [phone] Work Phone: [phone] Mobile Phone: [phone] Relation: Daughter  Code Status:  DNR Goals of care: Advanced Directive information Advanced Directives 01/26/2019  Does Patient Have a Medical Advance Directive? Yes  Type of Paramedic of [place];Living will;Out of facility DNR (pink MOST or yellow form)  Does patient want to make changes to medical advance directive? No - Patient declined  Copy of [place] in Chart? Yes - validated most recent copy scanned in chart (See row information)  Pre-existing out of facility DNR order (yellow form or pink MOST form) Pink MOST/Yellow Form most recent copy in chart - Physician notified to receive inpatient order     Chief Complaint  Patient presents with  . Acute Visit    Rash On Lower Back     HPI:  Pt is a 86 y.o. female seen today for an acute visit for mid to lower back rash, in satellite pattern, not sure if itches or not. The patient has hx of advanced dementia, unable to voice her needs, she resides in memory care unit [facility] for safety/care assistance, near total dependent of ADLs, w/c for mobility, incontinent of urine, wears adult depends. HTN, blood pressure is controlled  on Metoprolol 12.5mg  bid. Her mood is stable on Sertraline, Risperidone.    Past Medical History:  Diagnosis Date  . Acute encephalopathy   . Aphasia following unspecified cerebrovascular disease   . 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 Depression   . Dysphagia following cerebrovascular disease   . Hepatic steatosis   . Hydroureteronephrosis   . Hypertension   . Insomnia   . Macular degeneration   . Major depressive disorder, recurrent ([place])   . Neuralgia and neuritis, unspecified (CODE)   . Nontraumatic intracranial hemorrhage ([place])   . Stroke ([place])   . 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   . 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 ABDOMINAL HYSTERECTOMY    . APPENDECTOMY    . BREAST SURGERY    . MASTECTOMY Right     Allergies  Allergen Reactions  . Hydrocodone Nausea And Vomiting  . Morphine And Related Nausea And Vomiting  . Actonel [Risedronate Sodium] Other (See Comments)    unknown  . Darvon [Propoxyphene Hcl] Other (See Comments)    unknown  . Oxycodone Hcl Other (See Comments)    unknown  . Pneumovax [Pneumococcal Polysaccharide Vaccine] Other (See Comments)    unknown    Allergies as of 01/26/2019      Reactions   Hydrocodone Nausea And Vomiting   Morphine And Related Nausea And Vomiting   Actonel [risedronate Sodium] Other (See Comments)   unknown   Darvon [propoxyphene  Hcl] Other (See Comments)   unknown   Oxycodone Hcl Other (See Comments)   unknown   Pneumovax [pneumococcal Polysaccharide Vaccine] Other (See Comments)   unknown      Medication List       Accurate as of January 26, 2019  4:36 PM. If you have any questions, ask your nurse or doctor.        STOP taking these medications   GAS RELIEF 80 PO Stopped by: Kylene Zamarron X Roselynn Whitacre, NP   NON FORMULARY Stopped by: Avrom Robarts X Steve Youngberg, NP     TAKE these medications     acetaminophen 325 MG tablet Commonly known as: TYLENOL Take 650 mg by mouth every 4 (four) hours as needed.   bisacodyl 10 MG suppository Commonly known as: DULCOLAX Place 10 mg rectally daily as needed for mild constipation or moderate constipation.   hydrocortisone 2.5 % lotion Apply topically 2 (two) times daily as needed. Apply to itchy  area on face.   levETIRAcetam 100 MG/ML solution Commonly known as: KEPPRA Take 250 mg by mouth. 250 mg = 2.5 ML; oral Twice A Day  Morning: 7am-10am Evening: 7pm-10pm   loratadine 10 MG tablet Commonly known as: CLARITIN Take 10 mg by mouth daily. 7am-10am.   metoprolol tartrate 25 MG tablet Commonly known as: LOPRESSOR Take 12.5 mg by mouth 2 (two) times daily. Morning: 7am-10am Evening: 7pm-10pm.   mirtazapine 15 MG tablet Commonly known as: REMERON Take 7.5 mg by mouth at bedtime. 7pm-10pm.   nystatin powder Generic drug: nystatin Apply 1 application topically daily as needed. 100,000unit Under left breast   omeprazole 10 MG capsule Commonly known as: PRILOSEC Take 10 mg by mouth daily. 7am-10am.   polyethylene glycol 17 g packet Commonly known as: MIRALAX / GLYCOLAX Take 17 g by mouth daily. 7am-10am.   risperiDONE 0.25 MG tablet Commonly known as: RISPERDAL Take 0.25 mg by mouth every evening. 7pm-10pm   Rivaroxaban 15 MG Tabs tablet Commonly known as: XARELTO Take 15 mg by mouth every evening. 4pm-6pm. What changed: Another medication with the same name was removed. Continue taking this medication, and follow the directions you see here. Changed by: Danny Yackley X Maddoxx Burkitt, NP   sertraline 100 MG tablet Commonly known as: ZOLOFT Take 100 mg by mouth at bedtime. 7pm-10pm. Take with 25 mg tablet   sertraline 25 MG tablet Commonly known as: ZOLOFT Take 25 mg by mouth at bedtime. 7pm-10pm. Give 25mg  along with 100mg  to =125mg .      ROS was provided with assistance of staff.  Review of Systems  Constitutional: Negative  for activity change, appetite change, chills, diaphoresis, fatigue and fever.  HENT: Positive for hearing loss. Negative for congestion and voice change.   Eyes: Negative for visual disturbance.  Respiratory: Negative for cough, shortness of breath and wheezing.   Cardiovascular: Positive for leg swelling. Negative for chest pain and palpitations.  Gastrointestinal: Negative for abdominal distention, abdominal pain, constipation, nausea and vomiting.  Genitourinary: Negative for difficulty urinating, dysuria and urgency.       Incontinent of urine.   Musculoskeletal: Positive for arthralgias and gait problem.  Skin: Positive for rash.  Neurological: Negative for dizziness, speech difficulty, weakness and headaches.       Dementia.   Psychiatric/Behavioral: Positive for confusion. Negative for agitation, behavioral problems, hallucinations and sleep disturbance. The patient is not nervous/anxious.     Immunization History  Administered Date(s) Administered  . Influenza Whole 10/19/2017  . Influenza, High Dose Seasonal PF 10/08/2018  .  Influenza-Unspecified 11/20/2013, 09/26/2014, 10/24/2015, 11/03/2016  . Tdap 09/19/2016  . Zoster 01/07/2012   Pertinent  Health Maintenance Due  Topic Date Due  . INFLUENZA VACCINE  Completed  . DEXA SCAN  Discontinued   Fall Risk  09/15/2017 09/05/2016  Falls in the past year? No No   Functional Status Survey:    Vitals:   01/26/19 1535  BP: 128/70  Pulse: 68  Resp: 18  Temp: (!) 97.1 F (36.2 C)  SpO2: 94%  Weight: 157 lb 4.8 oz (71.4 kg)  Height: 5\' 3"  (1.6 m)   Body mass index is 27.86 kg/m. Physical Exam Vitals and nursing note reviewed.  Constitutional:      General: She is not in acute distress.    Appearance: Normal appearance. She is not ill-appearing, toxic-appearing or diaphoretic.  HENT:     Head: Normocephalic and atraumatic.     Nose: Nose normal.     Mouth/Throat:     Mouth: Mucous membranes are moist.  Eyes:      Extraocular Movements: Extraocular movements intact.     Conjunctiva/sclera: Conjunctivae normal.     Pupils: Pupils are equal, round, and reactive to light.  Cardiovascular:     Rate and Rhythm: Normal rate and regular rhythm.     Heart sounds: No murmur.  Pulmonary:     Breath sounds: No wheezing, rhonchi or rales.  Abdominal:     General: Bowel sounds are normal. There is no distension.     Palpations: Abdomen is soft.     Tenderness: There is no abdominal tenderness. There is no right CVA tenderness, left CVA tenderness, guarding or rebound.  Musculoskeletal:     Cervical back: Normal range of motion and neck supple.     Right lower leg: Edema present.     Left lower leg: Edema present.     Comments: Trace edema BLE  Skin:    General: Skin is warm and dry.     Findings: Rash present.     Comments: Mid to lower back about her hand sized clustered satellite in pattern rash  Neurological:     General: No focal deficit present.     Mental Status: She is alert. Mental status is at baseline.     Motor: No weakness.     Gait: Gait abnormal.     Comments: Oriented to self.   Psychiatric:     Comments: Confused. Followed simple directions.      Labs reviewed: Recent Labs    07/25/18 1032 07/26/18 0331 08/03/18 0000  NA 141 143 141  K 4.1 3.7 3.9  CL 110 113* 108  CO2 21* 22 23  GLUCOSE 108* 110*  --   BUN 9 7* 16  CREATININE 0.99 1.15* 1.1  CALCIUM 8.9 8.3* 8.7   Recent Labs    04/22/18 0000 07/25/18 1032 07/26/18 0331  AST 14 22 26   ALT 16 21 19   ALKPHOS 84 88 82  BILITOT  --  0.4 0.5  PROT  --  6.0* 5.7*  ALBUMIN  --  3.4* 3.2*   Recent Labs    07/25/18 1032 07/26/18 0331 08/03/18 0000  WBC 12.0* 7.7 7.1  HGB 15.0 13.2 14.3  HCT 47.5* 41.4 43  MCV 96.5 95.6  --   PLT 220 214 245   Lab Results  Component Value Date   TSH 1.32 02/16/2018   Lab Results  Component Value Date   HGBA1C 5.5 12/02/2016   Lab Results  Component Value  Date   CHOL 174  10/21/2016   HDL 45 10/21/2016   LDLCALC 103 10/21/2016   TRIG 158 10/21/2016    Significant Diagnostic Results in last 30 days:  No results found.  Assessment/Plan Heat rash Mid to lower back about her hand sized clustered satellite in pattern rash, heat and moist from the top of her adult depend and back of w/c are contributory, will apply 0.5% Triamcinolone cream, Nystatin cream bid x 7 days. Observe.   Vascular dementia with behavior disturbance ([place]) Continue memory care unit [facility] for safety, care assistance.   Essential hypertension, benign Blood pressure is controlled, continue Metoprolol.   Psychosis Her mood is stable, continue Sertraline, Risperidone.   Gait abnormality W/c for mobility.   Incontinent of urine Continue adult depends for incontinent care.      Family/ staff Communication: plan of care reviewed with the patient and charge nurse.   Labs/tests ordered:  none  Time spend 25 minutes.

## 2019-01-26 NOTE — Assessment & Plan Note (Signed)
Continue adult depends for incontinent care.

## 2019-01-31 DIAGNOSIS — Z03818 Encounter for observation for suspected exposure to other biological agents ruled out: Secondary | ICD-10-CM | POA: Diagnosis not present

## 2019-02-07 DIAGNOSIS — Z20828 Contact with and (suspected) exposure to other viral communicable diseases: Secondary | ICD-10-CM | POA: Diagnosis not present

## 2019-02-10 ENCOUNTER — Encounter: Payer: Self-pay | Admitting: Internal Medicine

## 2019-02-10 ENCOUNTER — Non-Acute Institutional Stay (SKILLED_NURSING_FACILITY): Payer: Medicare PPO | Admitting: Internal Medicine

## 2019-02-10 DIAGNOSIS — F331 Major depressive disorder, recurrent, moderate: Secondary | ICD-10-CM | POA: Diagnosis not present

## 2019-02-10 DIAGNOSIS — I10 Essential (primary) hypertension: Secondary | ICD-10-CM

## 2019-02-10 DIAGNOSIS — R569 Unspecified convulsions: Secondary | ICD-10-CM

## 2019-02-10 DIAGNOSIS — F0151 Vascular dementia with behavioral disturbance: Secondary | ICD-10-CM | POA: Diagnosis not present

## 2019-02-10 DIAGNOSIS — I639 Cerebral infarction, unspecified: Secondary | ICD-10-CM | POA: Diagnosis not present

## 2019-02-10 DIAGNOSIS — I8291 Chronic embolism and thrombosis of unspecified vein: Secondary | ICD-10-CM | POA: Diagnosis not present

## 2019-02-10 DIAGNOSIS — F01518 Vascular dementia, unspecified severity, with other behavioral disturbance: Secondary | ICD-10-CM

## 2019-02-10 NOTE — Progress Notes (Signed)
Location:  [facility] Room Number: 109A Place of Service:  SNF (31)  Provider:   Code Status:  Goals of Care:  Advanced Directives 02/10/2019  Does Patient Have a Medical Advance Directive? Yes  Type of Paramedic of [place];Out of facility DNR (pink MOST or yellow form);Living will  Does patient want to make changes to medical advance directive? No - Patient declined  Copy of [place] in Chart? Yes - validated most recent copy scanned in chart (See row information)  Pre-existing out of facility DNR order (yellow form or pink MOST form) Yellow form placed in chart (order not valid for inpatient use)     Chief Complaint  Patient presents with  . Medical Management of Chronic Issues    Routine Visit    HPI: Patient is a 86 y.o. female seen today for medical management of chronic diseases.    Patient has a history of CVA,vascular dementia with behavior issues, hypertension history of DVT on chronic Xarelto. H/o Seizures Patient Lives [facility] Patient seen for regular visit No New Nursing issues.Patient does have some behavior issues mostly in the evening But have been under control Recently Her daughter does not want anything aggressive for her wants her to be comfortable Her weight is stable    Past Medical History:  Diagnosis Date  . Acute encephalopathy   . Aphasia following unspecified cerebrovascular disease   . 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 Depression   . Dysphagia following cerebrovascular disease   . Hepatic steatosis   . Hydroureteronephrosis   . Hypertension   . Insomnia   . Macular degeneration   . Major depressive disorder, recurrent ([place])   . Neuralgia and neuritis, unspecified (CODE)   . Nontraumatic intracranial hemorrhage ([place])   . Stroke ([place])   . 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   . Unspecified type  of carcinoma in situ of unspecified breast    right    Past Surgical History:  Procedure Laterality Date  . ABDOMINAL HYSTERECTOMY    . APPENDECTOMY    . BREAST SURGERY    . MASTECTOMY Right     Allergies  Allergen Reactions  . Hydrocodone Nausea And Vomiting  . Morphine And Related Nausea And Vomiting  . Actonel [Risedronate Sodium] Other (See Comments)    unknown  . Darvon [Propoxyphene Hcl] Other (See Comments)    unknown  . Oxycodone Hcl Other (See Comments)    unknown  . Pneumovax [Pneumococcal Polysaccharide Vaccine] Other (See Comments)    unknown    Outpatient Encounter Medications as of 02/10/2019  Medication Sig  . acetaminophen (TYLENOL) 325 MG tablet Take 650 mg by mouth every 4 (four) hours as needed.  . bisacodyl (DULCOLAX) 10 MG suppository Place 10 mg rectally daily as needed for mild constipation or moderate constipation.  . hydrocortisone 2.5 % lotion Apply topically 2 (two) times daily as needed. Apply to itchy  area on face.  . levETIRAcetam (KEPPRA) 100 MG/ML solution Take 250 mg by mouth. 250 mg = 2.5 ML; oral Twice A Day  Morning: 7am-10am Evening: 7pm-10pm  . loratadine (CLARITIN) 10 MG tablet Take 10 mg by mouth daily. 7am-10am.  . metoprolol tartrate (LOPRESSOR) 25 MG tablet Take 12.5 mg by mouth 2 (two) times daily. Morning: 7am-10am Evening: 7pm-10pm.  . mirtazapine (REMERON) 15 MG tablet Take 7.5 mg by mouth at bedtime. 7pm-10pm.  .  NON FORMULARY Incontinent Care: apply Vaseline to buttocks after each incontinent episode Every Shift  . NON FORMULARY Moisturizing Cream to extremities with morning and evening care Special Instructions: APPLY MOISTURING CREAM TO EXTREMITIES TWICE DAILY Twice A Day  . NON FORMULARY Regular, Chopped Special Instructions: Regular, NAS, thin liquids, Chopped meats. Super oatmeal daily at breakfast. Mighty Mash potatoes daily. Magic cup with lunch and dinner. Divided plate  . Nutritional Supplements (NUTRITIONAL SHAKE  PO) Take by mouth. FORTIFIED MILKSHAKE WITH MEALS. Special Instructions: RECIPE POSTED IN KITCHEN. With Meals  . NUTRITIONAL SUPPLEMENTS PO Take by mouth. MAGIC CUP WITH LUNCH AND DINNER Twice A Day  . nystatin (NYSTATIN) powder Apply 1 application topically daily as needed. 100,000unit Under left breast  . omeprazole (PRILOSEC) 10 MG capsule Take 10 mg by mouth daily. 7am-10am.  . polyethylene glycol (MIRALAX / GLYCOLAX) packet Take 17 g by mouth daily. 7am-10am.  . risperiDONE (RISPERDAL) 0.25 MG tablet Take 0.25 mg by mouth every evening. 7pm-10pm  . Rivaroxaban (XARELTO) 15 MG TABS tablet Take 15 mg by mouth every evening. 4pm-6pm.  . sertraline (ZOLOFT) 100 MG tablet Take 100 mg by mouth at bedtime. 7pm-10pm. Take with 25 mg tablet  . sertraline (ZOLOFT) 25 MG tablet Take 25 mg by mouth at bedtime. 7pm-10pm. Give 25mg  along with 100mg  to =125mg .  . simethicone (MYLICON) 80 MG chewable tablet Chew 80 mg by mouth 3 (three) times daily with meals.   No facility-administered encounter medications on file as of 02/10/2019.    Review of Systems:  Review of Systems  Unable to perform ROS: Dementia    Health Maintenance  Topic Date Due  . TETANUS/TDAP  09/20/2026  . INFLUENZA VACCINE  Completed  . DEXA SCAN  Discontinued    Physical Exam: Vitals:   02/10/19 1426  BP: 130/60  Pulse: 91  Resp: 16  Temp: (!) 97.1 F (36.2 C)  TempSrc: Oral  SpO2: 93%  Weight: 157 lb 1.6 oz (71.3 kg)  Height: 5\' 3"  (1.6 m)   Body mass index is 27.83 kg/m. Physical Exam Vitals reviewed.  Constitutional:      Appearance: Normal appearance.  HENT:     Head: Normocephalic.     Nose: Nose normal.     Mouth/Throat:     Mouth: Mucous membranes are moist.     Pharynx: Oropharynx is clear.  Eyes:     Pupils: Pupils are equal, round, and reactive to light.  Cardiovascular:     Rate and Rhythm: Normal rate and regular rhythm.     Pulses: Normal pulses.  Pulmonary:     Effort: Pulmonary  effort is normal.     Breath sounds: Normal breath sounds.  Abdominal:     General: Abdomen is flat. Bowel sounds are normal.     Palpations: Abdomen is soft.  Musculoskeletal:     Cervical back: Neck supple.     Comments: Mild edema Bilateral  Skin:    General: Skin is warm.  Neurological:     General: No focal deficit present.     Mental Status: She is alert.  Psychiatric:        Mood and Affect: Mood normal.     Labs reviewed: Basic Metabolic Panel: Recent Labs    02/16/18 0000 04/22/18 0000 07/25/18 1032 07/26/18 0331 08/03/18 0000  NA  --    < > 141 143 141  K  --    < > 4.1 3.7 3.9  CL  --   --  110 113* 108  CO2  --   --  21* 22 23  GLUCOSE  --   --  108* 110*  --   BUN  --    < > 9 7* 16  CREATININE  --    < > 0.99 1.15* 1.1  CALCIUM  --   --  8.9 8.3* 8.7  TSH 1.32  --   --   --   --    < > = values in this interval not displayed.   Liver Function Tests: Recent Labs    04/22/18 0000 07/25/18 1032 07/26/18 0331  AST 14 22 26   ALT 16 21 19   ALKPHOS 84 88 82  BILITOT  --  0.4 0.5  PROT  --  6.0* 5.7*  ALBUMIN  --  3.4* 3.2*   Recent Labs    07/25/18 1032  LIPASE 34   No results for input(s): AMMONIA in the last 8760 hours. CBC: Recent Labs    07/25/18 1032 07/26/18 0331 08/03/18 0000  WBC 12.0* 7.7 7.1  HGB 15.0 13.2 14.3  HCT 47.5* 41.4 43  MCV 96.5 95.6  --   PLT 220 214 245   Lipid Panel: No results for input(s): CHOL, HDL, LDLCALC, TRIG, CHOLHDL, LDLDIRECT in the last 8760 hours. Lab Results  Component Value Date   HGBA1C 5.5 12/02/2016    Procedures since last visit: No results found.  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

## 2019-02-14 DIAGNOSIS — Z20828 Contact with and (suspected) exposure to other viral communicable diseases: Secondary | ICD-10-CM | POA: Diagnosis not present

## 2019-02-21 ENCOUNTER — Encounter: Payer: Self-pay | Admitting: Nurse Practitioner

## 2019-02-21 ENCOUNTER — Non-Acute Institutional Stay (SKILLED_NURSING_FACILITY): Payer: Medicare PPO | Admitting: Nurse Practitioner

## 2019-02-21 DIAGNOSIS — F01518 Vascular dementia, unspecified severity, with other behavioral disturbance: Secondary | ICD-10-CM

## 2019-02-21 DIAGNOSIS — R569 Unspecified convulsions: Secondary | ICD-10-CM | POA: Diagnosis not present

## 2019-02-21 DIAGNOSIS — I69991 Dysphagia following unspecified cerebrovascular disease: Secondary | ICD-10-CM

## 2019-02-21 DIAGNOSIS — K5901 Slow transit constipation: Secondary | ICD-10-CM

## 2019-02-21 DIAGNOSIS — F0151 Vascular dementia with behavioral disturbance: Secondary | ICD-10-CM | POA: Diagnosis not present

## 2019-02-21 DIAGNOSIS — J309 Allergic rhinitis, unspecified: Secondary | ICD-10-CM

## 2019-02-21 DIAGNOSIS — K219 Gastro-esophageal reflux disease without esophagitis: Secondary | ICD-10-CM

## 2019-02-21 DIAGNOSIS — Z20828 Contact with and (suspected) exposure to other viral communicable diseases: Secondary | ICD-10-CM | POA: Diagnosis not present

## 2019-02-21 NOTE — Assessment & Plan Note (Signed)
Total care of her ADLs, advanced dementia, continue Memory care unit [facility] for safety, care assistance.

## 2019-02-21 NOTE — Assessment & Plan Note (Signed)
Stable, continue Keppra 

## 2019-02-21 NOTE — Assessment & Plan Note (Signed)
Stable, continue Claritin.  

## 2019-02-21 NOTE — Assessment & Plan Note (Signed)
Stable, continue MiraLax.  °

## 2019-02-21 NOTE — Assessment & Plan Note (Signed)
Stable, continue Omeprazole.  

## 2019-02-21 NOTE — Assessment & Plan Note (Signed)
02/21/19 reported excessive coughing episode with medications 02/19/19. After administration of liquid Keppra, the resides has continuous cough lasting approximately 53minutes. The patient's face became pink, no O2 desaturation, coughed so hard and was heard making a gagging noise, nasal congestion noted, thick clear mucous expelled from nose, heard the patent's coughing up phlegm but swallowing it right back.  CXR ap/lateral. ST to eval

## 2019-02-21 NOTE — Progress Notes (Signed)
Location:   [place] Room Number: 109 Place of Service:  SNF (31) Provider: Thandiwe Siragusa X, NP  Rip Hawes X, NP  Patient Care Team: Itati Brocksmith X, NP as PCP - General (Internal Medicine) Manaia Samad X, NP as Nurse Practitioner (Internal Medicine) Idolina Primer, Warnell Bureau as Consulting Physician (Optometry) Monna Fam, MD as Consulting Physician (Ophthalmology) Lucky Cowboy, Gordy Clement, NP as Nurse Practitioner (Hospice and Palliative Medicine) Virgie Dad, MD as Consulting Physician (Internal Medicine)  Extended Emergency Contact Information Primary Emergency Contact: [facility] Address: [id number], [place] [address] [name] of [place] Phone: [phone] Work Phone: [phone] Mobile Phone: [phone] Relation: Daughter  Code Status:  DNR Goals of care: Advanced Directive information Advanced Directives 02/21/2019  Does Patient Have a Medical Advance Directive? Yes  Type of Advance Directive Out of facility DNR (pink MOST or yellow form);[place];Living will  Does patient want to make changes to medical advance directive? No - Patient declined  Copy of [place] in Chart? Yes - validated most recent copy scanned in chart (See row information)  Pre-existing out of facility DNR order (yellow form or pink MOST form) Yellow form placed in chart (order not valid for inpatient use)     Chief Complaint  Patient presents with  . Acute Visit    Cough    HPI:  Pt is a 86 y.o. female seen today for an acute visit for  reported excessive coughing episode with medications 02/19/19. After administration of liquid Keppra, the resides has continuous cough lasting approximately 10minutes. The patient's face became pink, no O2 desaturation, coughed so hard and was heard making a gagging noise, nasal congestion noted, thick clear mucous expelled from nose, heard the patent's coughing up  phlegm but swallowing it right back. The patient resides in memory care unit [facility] for safety, care assistance, Hx of seizures, stable, on Keppra 250mg  bid, allergic rhinitis, stable, on Claritin 10mg  qd, GERD, stable, on Omeprazole 10mg  qd, constipation, stable, on MIraLax qd.    Past Medical History:  Diagnosis Date  . Acute encephalopathy   . Aphasia following unspecified cerebrovascular disease   . 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 Depression   . Dysphagia following cerebrovascular disease   . Hepatic steatosis   . Hydroureteronephrosis   . Hypertension   . Insomnia   . Macular degeneration   . Major depressive disorder, recurrent ([place])   . Neuralgia and neuritis, unspecified ([place])   . Nontraumatic intracranial hemorrhage ([place])   . Stroke ([place])   . 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   . 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 ABDOMINAL HYSTERECTOMY    . APPENDECTOMY    . BREAST SURGERY    . MASTECTOMY Right     Allergies  Allergen Reactions  . Hydrocodone Nausea And Vomiting  . Morphine And Related Nausea And Vomiting  . Actonel [Risedronate Sodium] Other (See Comments)    unknown  . Darvon [Propoxyphene Hcl] Other (See Comments)    unknown  . Oxycodone Hcl Other (See Comments)    unknown  . Pneumovax [Pneumococcal Polysaccharide Vaccine] Other (See Comments)    unknown    Allergies as of 02/21/2019      Reactions   Hydrocodone Nausea And  Vomiting   Morphine And Related Nausea And Vomiting   Actonel [risedronate Sodium] Other (See Comments)   unknown   Darvon [propoxyphene Hcl] Other (See Comments)   unknown   Oxycodone Hcl Other (See Comments)   unknown   Pneumovax [pneumococcal Polysaccharide Vaccine] Other (See Comments)   unknown      Medication List       Accurate as of February 21, 2019  4:25 PM. If you have any  questions, ask your nurse or doctor.        acetaminophen 325 MG tablet Commonly known as: TYLENOL Take 650 mg by mouth every 4 (four) hours as needed.   bisacodyl 10 MG suppository Commonly known as: DULCOLAX Place 10 mg rectally daily as needed for mild constipation or moderate constipation.   hydrocortisone 2.5 % lotion Apply topically 2 (two) times daily as needed. Apply to itchy  area on face.   levETIRAcetam 100 MG/ML solution Commonly known as: KEPPRA Take 250 mg by mouth. 250 mg = 2.5 ML; oral Twice A Day  Morning: 7am-10am Evening: 7pm-10pm   loratadine 10 MG tablet Commonly known as: CLARITIN Take 10 mg by mouth daily. 7am-10am.   metoprolol tartrate 25 MG tablet Commonly known as: LOPRESSOR Take 12.5 mg by mouth 2 (two) times daily. Morning: 7am-10am Evening: 7pm-10pm.   mirtazapine 15 MG tablet Commonly known as: REMERON Take 7.5 mg by mouth at bedtime. 7pm-10pm.   NON FORMULARY Incontinent Care: apply Vaseline to buttocks after each incontinent episode Every Shift   NON FORMULARY Moisturizing Cream to extremities with morning and evening care Special Instructions: APPLY MOISTURING CREAM TO EXTREMITIES TWICE DAILY Twice A Day   NON FORMULARY Regular, Chopped Special Instructions: Regular, NAS, thin liquids, Chopped meats. Super oatmeal daily at breakfast. Mighty Mash potatoes daily. Magic cup with lunch and dinner. Divided plate   NUTRITIONAL SHAKE PO Take by mouth. FORTIFIED MILKSHAKE WITH MEALS. Special Instructions: RECIPE POSTED IN KITCHEN. With Meals   NUTRITIONAL SUPPLEMENTS PO Take by mouth. MAGIC CUP WITH LUNCH AND DINNER Twice A Day   nystatin powder Generic drug: nystatin Apply 1 application topically daily as needed. 100,000unit Under left breast   omeprazole 10 MG capsule Commonly known as: PRILOSEC Take 10 mg by mouth daily. 7am-10am.   polyethylene glycol 17 g packet Commonly known as: MIRALAX / GLYCOLAX Take 17 g by mouth  daily. 7am-10am.   risperiDONE 0.25 MG tablet Commonly known as: RISPERDAL Take 0.25 mg by mouth every evening. 7pm-10pm   Rivaroxaban 15 MG Tabs tablet Commonly known as: XARELTO Take 15 mg by mouth every evening. 4pm-6pm.   sertraline 100 MG tablet Commonly known as: ZOLOFT Take 100 mg by mouth at bedtime. 7pm-10pm. Take with 25 mg tablet   sertraline 25 MG tablet Commonly known as: ZOLOFT Take 25 mg by mouth at bedtime. 7pm-10pm. Give 25mg  along with 100mg  to =125mg .   simethicone 80 MG chewable tablet Commonly known as: MYLICON Chew 80 mg by mouth 3 (three) times daily with meals.       Review of Systems  Constitutional: Negative for activity change, appetite change, chills, diaphoresis, fatigue and fever.  HENT: Positive for hearing loss and trouble swallowing. Negative for congestion and voice change.   Eyes: Negative for visual disturbance.  Respiratory: Positive for cough. Negative for shortness of breath and wheezing.   Cardiovascular: Positive for leg swelling. Negative for chest pain and palpitations.  Gastrointestinal: Negative for abdominal distention, abdominal pain, constipation, nausea and vomiting.  Genitourinary: Negative  for difficulty urinating, dysuria and urgency.       Incontinent of urine.   Musculoskeletal: Positive for arthralgias and gait problem.  Neurological: Negative for dizziness, speech difficulty, weakness and headaches.       Dementia.   Psychiatric/Behavioral: Positive for confusion. Negative for agitation, behavioral problems, hallucinations and sleep disturbance. The patient is not nervous/anxious.     Immunization History  Administered Date(s) Administered  . Influenza Whole 10/19/2017  . Influenza, High Dose Seasonal PF 10/08/2018  . Influenza-Unspecified 11/20/2013, 09/26/2014, 10/24/2015, 11/03/2016  . Moderna SARS-COVID-2 Vaccination 01/08/2019  . Tdap 09/19/2016  . Zoster 01/07/2012   Pertinent  Health Maintenance Due    Topic Date Due  . INFLUENZA VACCINE  Completed  . DEXA SCAN  Discontinued   Fall Risk  09/15/2017 09/05/2016  Falls in the past year? No No   Functional Status Survey:    Vitals:   02/21/19 1450  BP: 132/68  Pulse: 70  Resp: 16  Temp: (!) 97.5 F (36.4 C)  SpO2: 93%  Weight: 157 lb 1.6 oz (71.3 kg)  Height: 5\' 3"  (1.6 m)   Body mass index is 27.83 kg/m. Physical Exam Vitals and nursing note reviewed.  Constitutional:      General: She is not in acute distress.    Appearance: Normal appearance. She is not ill-appearing, toxic-appearing or diaphoretic.  HENT:     Head: Normocephalic and atraumatic.     Nose: Nose normal.     Mouth/Throat:     Mouth: Mucous membranes are moist.  Eyes:     Extraocular Movements: Extraocular movements intact.     Conjunctiva/sclera: Conjunctivae normal.     Pupils: Pupils are equal, round, and reactive to light.  Cardiovascular:     Rate and Rhythm: Normal rate and regular rhythm.     Heart sounds: No murmur.  Pulmonary:     Breath sounds: No wheezing, rhonchi or rales.  Abdominal:     General: Bowel sounds are normal. There is no distension.     Palpations: Abdomen is soft.     Tenderness: There is no abdominal tenderness. There is no right CVA tenderness, left CVA tenderness, guarding or rebound.  Musculoskeletal:     Cervical back: Normal range of motion and neck supple.     Right lower leg: Edema present.     Left lower leg: Edema present.     Comments: Trace edema BLE  Skin:    General: Skin is warm and dry.  Neurological:     General: No focal deficit present.     Mental Status: She is alert. Mental status is at baseline.     Motor: No weakness.     Gait: Gait abnormal.     Comments: Oriented to self.   Psychiatric:     Comments: Confused. Followed simple directions.      Labs reviewed: Recent Labs    07/25/18 1032 07/26/18 0331 08/03/18 0000  NA 141 143 141  K 4.1 3.7 3.9  CL 110 113* 108  CO2 21* 22 23   GLUCOSE 108* 110*  --   BUN 9 7* 16  CREATININE 0.99 1.15* 1.1  CALCIUM 8.9 8.3* 8.7   Recent Labs    04/22/18 0000 07/25/18 1032 07/26/18 0331  AST 14 22 26   ALT 16 21 19   ALKPHOS 84 88 82  BILITOT  --  0.4 0.5  PROT  --  6.0* 5.7*  ALBUMIN  --  3.4* 3.2*   Recent Labs  07/25/18 1032 07/26/18 0331 08/03/18 0000  WBC 12.0* 7.7 7.1  HGB 15.0 13.2 14.3  HCT 47.5* 41.4 43  MCV 96.5 95.6  --   PLT 220 214 245   Lab Results  Component Value Date   TSH 1.32 02/16/2018   Lab Results  Component Value Date   HGBA1C 5.5 12/02/2016   Lab Results  Component Value Date   CHOL 174 10/21/2016   HDL 45 10/21/2016   LDLCALC 103 10/21/2016   TRIG 158 10/21/2016    Significant Diagnostic Results in last 30 days:  No results found.  Assessment/Plan Seizure ([place]) Stable, continue Keppra  Vascular dementia with behavior disturbance ([place]) Total care of her ADLs, advanced dementia, continue Memory care unit [facility] for safety, care assistance.   Slow transit constipation Stable, continue MiraLax.   GERD (gastroesophageal reflux disease) Stable, continue Omeprazole.   Allergic rhinitis Stable, continue Claritin.   Dysphagia following cerebrovascular disease 02/21/19 reported excessive coughing episode with medications 02/19/19. After administration of liquid Keppra, the resides has continuous cough lasting approximately 46minutes. The patient's face became pink, no O2 desaturation, coughed so hard and was heard making a gagging noise, nasal congestion noted, thick clear mucous expelled from nose, heard the patent's coughing up phlegm but swallowing it right back.  CXR ap/lateral. ST to eval     Family/ staff Communication: plan of care reviewed with the patient and charge nurse.   Labs/tests ordered:  CXR ap/lateral views  Time spend 25 minutes.

## 2019-02-22 DIAGNOSIS — R0989 Other specified symptoms and signs involving the circulatory and respiratory systems: Secondary | ICD-10-CM | POA: Diagnosis not present

## 2019-02-25 DIAGNOSIS — K219 Gastro-esophageal reflux disease without esophagitis: Secondary | ICD-10-CM | POA: Diagnosis not present

## 2019-02-25 DIAGNOSIS — R29898 Other symptoms and signs involving the musculoskeletal system: Secondary | ICD-10-CM | POA: Diagnosis not present

## 2019-02-25 DIAGNOSIS — I10 Essential (primary) hypertension: Secondary | ICD-10-CM | POA: Diagnosis not present

## 2019-02-25 DIAGNOSIS — M6281 Muscle weakness (generalized): Secondary | ICD-10-CM | POA: Diagnosis not present

## 2019-02-25 DIAGNOSIS — N390 Urinary tract infection, site not specified: Secondary | ICD-10-CM | POA: Diagnosis not present

## 2019-02-25 DIAGNOSIS — F0151 Vascular dementia with behavioral disturbance: Secondary | ICD-10-CM | POA: Diagnosis not present

## 2019-02-25 DIAGNOSIS — R1312 Dysphagia, oropharyngeal phase: Secondary | ICD-10-CM | POA: Diagnosis not present

## 2019-02-25 DIAGNOSIS — N398 Other specified disorders of urinary system: Secondary | ICD-10-CM | POA: Diagnosis not present

## 2019-02-25 DIAGNOSIS — S22070D Wedge compression fracture of T9-T10 vertebra, subsequent encounter for fracture with routine healing: Secondary | ICD-10-CM | POA: Diagnosis not present

## 2019-03-07 ENCOUNTER — Non-Acute Institutional Stay (SKILLED_NURSING_FACILITY): Payer: Medicare PPO | Admitting: Nurse Practitioner

## 2019-03-07 ENCOUNTER — Encounter: Payer: Self-pay | Admitting: Nurse Practitioner

## 2019-03-07 DIAGNOSIS — I10 Essential (primary) hypertension: Secondary | ICD-10-CM | POA: Diagnosis not present

## 2019-03-07 DIAGNOSIS — R21 Rash and other nonspecific skin eruption: Secondary | ICD-10-CM

## 2019-03-07 DIAGNOSIS — F01518 Vascular dementia, unspecified severity, with other behavioral disturbance: Secondary | ICD-10-CM

## 2019-03-07 DIAGNOSIS — F29 Unspecified psychosis not due to a substance or known physiological condition: Secondary | ICD-10-CM

## 2019-03-07 DIAGNOSIS — R32 Unspecified urinary incontinence: Secondary | ICD-10-CM

## 2019-03-07 DIAGNOSIS — F0151 Vascular dementia with behavioral disturbance: Secondary | ICD-10-CM

## 2019-03-07 NOTE — Progress Notes (Signed)
Location:   [place] Room Number: 109 Place of Service:  SNF (31) Provider:  Lonita Debes X, NP  Lola Lofaro X, NP  Patient Care Team: Jamile Sivils X, NP as PCP - General (Internal Medicine) Amina Menchaca X, NP as Nurse Practitioner (Internal Medicine) Idolina Primer, Warnell Bureau as Consulting Physician (Optometry) Monna Fam, MD as Consulting Physician (Ophthalmology) Lucky Cowboy, Gordy Clement, NP as Nurse Practitioner (Hospice and Palliative Medicine) Virgie Dad, MD as Consulting Physician (Internal Medicine)  Extended Emergency Contact Information Primary Emergency Contact: [facility] Address: [id number], [place] [address] [name] of [place] Phone: [phone] Work Phone: [phone] Mobile Phone: [phone] Relation: Daughter  Code Status:  DNR Goals of care: Advanced Directive information Advanced Directives 02/21/2019  Does Patient Have a Medical Advance Directive? Yes  Type of Advance Directive Out of facility DNR (pink MOST or yellow form);[place];Living will  Does patient want to make changes to medical advance directive? No - Patient declined  Copy of [place] in Chart? Yes - validated most recent copy scanned in chart (See row information)  Pre-existing out of facility DNR order (yellow form or pink MOST form) Yellow form placed in chart (order not valid for inpatient use)     Chief Complaint  Patient presents with  . Acute Visit    Rash on back    HPI:  Pt is a 86 y.o. female seen today for an acute visit for  rash on back that is uncomfortable and causing her pain(? Itching), satellite pattern rash at the upper band of adult brief, moist and heat are contributory  The patient resides in Memory care unit [facility], incontinent of urine, uses adult brief for care, w/c for mobility. Her mood is stable, on sertraline 120m qd, risperidone 0.25mg  qd. HTN, blood pressure is  controlled on Metoprolol.     Past Medical History:  Diagnosis Date  . Acute encephalopathy   . Aphasia following unspecified cerebrovascular disease   . 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 Depression   . Dysphagia following cerebrovascular disease   . Hepatic steatosis   . Hydroureteronephrosis   . Hypertension   . Insomnia   . Macular degeneration   . Major depressive disorder, recurrent ([place])   . Neuralgia and neuritis, unspecified (CODE)   . Nontraumatic intracranial hemorrhage ([place])   . Stroke ([place])   . 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   . 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 ABDOMINAL HYSTERECTOMY    . APPENDECTOMY    . BREAST SURGERY    . MASTECTOMY Right     Allergies  Allergen Reactions  . Hydrocodone Nausea And Vomiting  . Morphine And Related Nausea And Vomiting  . Actonel [Risedronate Sodium] Other (See Comments)    unknown  . Darvon [Propoxyphene Hcl] Other (See Comments)    unknown  . Oxycodone Hcl Other (See Comments)    unknown  . Pneumovax [Pneumococcal Polysaccharide Vaccine] Other (See Comments)    unknown    Allergies as of 03/07/2019      Reactions   Hydrocodone Nausea And Vomiting   Morphine And Related Nausea And Vomiting   Actonel [risedronate Sodium] Other (See Comments)   unknown   Darvon [propoxyphene Hcl] Other (See Comments)  unknown   Oxycodone Hcl Other (See Comments)   unknown   Pneumovax [pneumococcal Polysaccharide Vaccine] Other (See Comments)   unknown      Medication List       Accurate as of March 07, 2019 11:59 PM. If you have any questions, ask your nurse or doctor.        acetaminophen 325 MG tablet Commonly known as: TYLENOL Take 650 mg by mouth every 4 (four) hours as needed.   bisacodyl 10 MG suppository Commonly known as: DULCOLAX Place 10 mg rectally daily as needed for  mild constipation or moderate constipation.   hydrocortisone 2.5 % lotion Apply topically 2 (two) times daily as needed. Apply to itchy  area on face.   levETIRAcetam 100 MG/ML solution Commonly known as: KEPPRA Take 250 mg by mouth. 250 mg = 2.5 ML; oral Twice A Day  Morning: 7am-10am Evening: 7pm-10pm   loratadine 10 MG tablet Commonly known as: CLARITIN Take 10 mg by mouth daily. 7am-10am.   metoprolol tartrate 25 MG tablet Commonly known as: LOPRESSOR Take 12.5 mg by mouth 2 (two) times daily. Morning: 7am-10am Evening: 7pm-10pm.   mirtazapine 15 MG tablet Commonly known as: REMERON Take 7.5 mg by mouth at bedtime. 7pm-10pm.   NON FORMULARY Incontinent Care: apply Vaseline to buttocks after each incontinent episode Every Shift   NON FORMULARY Moisturizing Cream to extremities with morning and evening care Special Instructions: APPLY MOISTURING CREAM TO EXTREMITIES TWICE DAILY Twice A Day   NON FORMULARY Regular, Chopped Special Instructions: Regular, NAS, thin liquids, Chopped meats. Super oatmeal daily at breakfast. Mighty Mash potatoes daily. Magic cup with lunch and dinner. Divided plate   NUTRITIONAL SHAKE PO Take by mouth. FORTIFIED MILKSHAKE WITH MEALS. Special Instructions: RECIPE POSTED IN KITCHEN. With Meals   NUTRITIONAL SUPPLEMENTS PO Take by mouth. MAGIC CUP WITH LUNCH AND DINNER Twice A Day   nystatin powder Generic drug: nystatin Apply 1 application topically daily as needed. 100,000unit Under left breast and back   omeprazole 10 MG capsule Commonly known as: PRILOSEC Take 10 mg by mouth daily. 7am-10am.   polyethylene glycol 17 g packet Commonly known as: MIRALAX / GLYCOLAX Take 17 g by mouth daily. 7am-10am.   risperiDONE 0.25 MG tablet Commonly known as: RISPERDAL Take 0.25 mg by mouth every evening. 7pm-10pm   Rivaroxaban 15 MG Tabs tablet Commonly known as: XARELTO Take 15 mg by mouth every evening. 4pm-6pm.   sertraline 100 MG  tablet Commonly known as: ZOLOFT Take 100 mg by mouth at bedtime. 7pm-10pm. Take with 25 mg tablet   sertraline 25 MG tablet Commonly known as: ZOLOFT Take 25 mg by mouth at bedtime. 7pm-10pm. Give 25mg  along with 100mg  to =125mg .   simethicone 80 MG chewable tablet Commonly known as: MYLICON Chew 80 mg by mouth 3 (three) times daily with meals.   triamcinolone cream 0.5 % Commonly known as: KENALOG Apply 1 application topically 2 (two) times daily. lower back rash   triamcinolone cream 0.5 % Commonly known as: KENALOG Apply 1 application topically 2 (two) times daily as needed.     ROS was provided with assistance of staff.   Review of Systems  Constitutional: Negative for activity change, appetite change, chills, diaphoresis, fatigue and fever.  HENT: Positive for hearing loss and trouble swallowing. Negative for congestion and voice change.   Eyes: Negative for visual disturbance.  Respiratory: Positive for cough. Negative for shortness of breath.   Cardiovascular: Positive for leg swelling. Negative for chest pain  and palpitations.  Gastrointestinal: Negative for abdominal pain, constipation, nausea and vomiting.  Genitourinary: Negative for difficulty urinating, dysuria and urgency.       Incontinent of urine.   Musculoskeletal: Positive for arthralgias and gait problem.  Skin: Positive for rash.       Lower back  Neurological: Negative for dizziness, speech difficulty, weakness and headaches.       Dementia.   Psychiatric/Behavioral: Positive for confusion. Negative for agitation, behavioral problems, hallucinations and sleep disturbance. The patient is not nervous/anxious.     Immunization History  Administered Date(s) Administered  . Influenza Whole 10/19/2017  . Influenza, High Dose Seasonal PF 10/08/2018  . Influenza-Unspecified 11/20/2013, 09/26/2014, 10/24/2015, 11/03/2016  . Moderna SARS-COVID-2 Vaccination 01/08/2019  . Tdap 09/19/2016  . Zoster  01/07/2012   Pertinent  Health Maintenance Due  Topic Date Due  . INFLUENZA VACCINE  Completed  . DEXA SCAN  Discontinued   Fall Risk  09/15/2017 09/05/2016  Falls in the past year? No No   Functional Status Survey:    Vitals:   03/07/19 1451  BP: 134/70  Pulse: 80  Resp: 16  Temp: 98.6 F (37 C)  SpO2: 92%  Weight: 158 lb (71.7 kg)  Height: 5\' 3"  (1.6 m)   Body mass index is 27.99 kg/m. Physical Exam Vitals and nursing note reviewed.  Constitutional:      General: She is not in acute distress.    Appearance: Normal appearance. She is not ill-appearing.  HENT:     Head: Normocephalic and atraumatic.     Nose: Nose normal.     Mouth/Throat:     Mouth: Mucous membranes are moist.  Eyes:     Extraocular Movements: Extraocular movements intact.     Conjunctiva/sclera: Conjunctivae normal.     Pupils: Pupils are equal, round, and reactive to light.  Cardiovascular:     Rate and Rhythm: Normal rate and regular rhythm.     Heart sounds: No murmur.  Pulmonary:     Breath sounds: No wheezing or rales.  Abdominal:     General: Bowel sounds are normal. There is no distension.     Palpations: Abdomen is soft.     Tenderness: There is no abdominal tenderness. There is no right CVA tenderness, left CVA tenderness, guarding or rebound.  Musculoskeletal:     Cervical back: Normal range of motion and neck supple.     Right lower leg: Edema present.     Left lower leg: Edema present.     Comments: Trace edema BLE  Skin:    General: Skin is warm and dry.     Findings: Rash present.     Comments: Lower back where the adult brief waist band region, raised satellite patterned itching rash, no pus or odor.   Neurological:     General: No focal deficit present.     Mental Status: She is alert. Mental status is at baseline.     Motor: No weakness.     Gait: Gait abnormal.     Comments: Oriented to self.   Psychiatric:     Comments: Confused. Followed simple directions.       Labs reviewed: Recent Labs    07/25/18 1032 07/26/18 0331 08/03/18 0000  NA 141 143 141  K 4.1 3.7 3.9  CL 110 113* 108  CO2 21* 22 23  GLUCOSE 108* 110*  --   BUN 9 7* 16  CREATININE 0.99 1.15* 1.1  CALCIUM 8.9 8.3* 8.7  Recent Labs    04/22/18 0000 07/25/18 1032 07/26/18 0331  AST 14 22 26   ALT 16 21 19   ALKPHOS 84 88 82  BILITOT  --  0.4 0.5  PROT  --  6.0* 5.7*  ALBUMIN  --  3.4* 3.2*   Recent Labs    07/25/18 1032 07/26/18 0331 08/03/18 0000  WBC 12.0* 7.7 7.1  HGB 15.0 13.2 14.3  HCT 47.5* 41.4 43  MCV 96.5 95.6  --   PLT 220 214 245   Lab Results  Component Value Date   TSH 1.32 02/16/2018   Lab Results  Component Value Date   HGBA1C 5.5 12/02/2016   Lab Results  Component Value Date   CHOL 174 10/21/2016   HDL 45 10/21/2016   LDLCALC 103 10/21/2016   TRIG 158 10/21/2016    Significant Diagnostic Results in last 30 days:  No results found.  Assessment/Plan Rash  rash on back that is uncomfortable and causing her pain(? Itching), satellite pattern rash at the upper band of adult brief, moist and heat are contributory, 0.5% Triamcinolone cream, Nystatin cream bid x 7 days, then prn.    Essential hypertension, benign Blood pressure is controlled, continue Metoprolol   Vascular dementia with behavior disturbance ([place]) Continue memory care unit [facility] for safety, care assistance, w/c for mobility.   Urinary incontinence Related to advanced dementia, continue assistance with adult brief for incontinent care  Psychosis Her mood is stable, continue Sertraline, Risperidone.      Family/ staff Communication: plan of care reviewed with the patient and charge nurse.   Labs/tests ordered:  none  Time spend 25 minutes.

## 2019-03-10 ENCOUNTER — Encounter: Payer: Self-pay | Admitting: Nurse Practitioner

## 2019-03-10 NOTE — Assessment & Plan Note (Signed)
Continue memory care unit [facility] for safety, care assistance, w/c for mobility.

## 2019-03-10 NOTE — Assessment & Plan Note (Signed)
Her mood is stable, continue Sertraline, Risperidone.

## 2019-03-10 NOTE — Assessment & Plan Note (Signed)
Related to advanced dementia, continue assistance with adult brief for incontinent care

## 2019-03-10 NOTE — Assessment & Plan Note (Signed)
rash on back that is uncomfortable and causing her pain(? Itching), satellite pattern rash at the upper band of adult brief, moist and heat are contributory, 0.5% Triamcinolone cream, Nystatin cream bid x 7 days, then prn.

## 2019-03-10 NOTE — Assessment & Plan Note (Signed)
Blood pressure is controlled, continue Metoprolol. 

## 2019-03-16 ENCOUNTER — Encounter: Payer: Self-pay | Admitting: Nurse Practitioner

## 2019-03-16 ENCOUNTER — Non-Acute Institutional Stay (SKILLED_NURSING_FACILITY): Payer: Medicare PPO | Admitting: Nurse Practitioner

## 2019-03-16 DIAGNOSIS — R569 Unspecified convulsions: Secondary | ICD-10-CM

## 2019-03-16 DIAGNOSIS — I10 Essential (primary) hypertension: Secondary | ICD-10-CM | POA: Diagnosis not present

## 2019-03-16 DIAGNOSIS — K5901 Slow transit constipation: Secondary | ICD-10-CM | POA: Diagnosis not present

## 2019-03-16 DIAGNOSIS — Z20828 Contact with and (suspected) exposure to other viral communicable diseases: Secondary | ICD-10-CM | POA: Diagnosis not present

## 2019-03-16 DIAGNOSIS — R21 Rash and other nonspecific skin eruption: Secondary | ICD-10-CM | POA: Diagnosis not present

## 2019-03-16 DIAGNOSIS — I8291 Chronic embolism and thrombosis of unspecified vein: Secondary | ICD-10-CM

## 2019-03-16 DIAGNOSIS — F0151 Vascular dementia with behavioral disturbance: Secondary | ICD-10-CM

## 2019-03-16 DIAGNOSIS — F01518 Vascular dementia, unspecified severity, with other behavioral disturbance: Secondary | ICD-10-CM

## 2019-03-16 DIAGNOSIS — K219 Gastro-esophageal reflux disease without esophagitis: Secondary | ICD-10-CM

## 2019-03-16 DIAGNOSIS — F331 Major depressive disorder, recurrent, moderate: Secondary | ICD-10-CM | POA: Diagnosis not present

## 2019-03-16 NOTE — Assessment & Plan Note (Signed)
Stable, continue MiraLax.  °

## 2019-03-16 NOTE — Assessment & Plan Note (Signed)
Resolved on the lower back

## 2019-03-16 NOTE — Assessment & Plan Note (Signed)
Stable, continue Keppra 250mg  bid.

## 2019-03-16 NOTE — Assessment & Plan Note (Signed)
Her mood and weight are stable, continue Sertraline, Risperidone, Mirtazapine.

## 2019-03-16 NOTE — Assessment & Plan Note (Signed)
In setting of protein C/S deficiency, continue chronic anticoagulation therapy with Xarelto 15mg  qd.

## 2019-03-16 NOTE — Assessment & Plan Note (Signed)
Continue memory care unit [facility] for supportive care.

## 2019-03-16 NOTE — Assessment & Plan Note (Signed)
Stable, continue Omeprazole 10mg qd.  

## 2019-03-16 NOTE — Progress Notes (Signed)
Location:   [place] Room Number: [place]:  SNF (31) Provider:  Sahara Fujimoto, NP  Cassius Cullinane X, NP  Patient Care Team: Kahlan Engebretson X, NP as PCP - General (Internal Medicine) Lisset Ketchem X, NP as Nurse Practitioner (Internal Medicine) Idolina Primer, Warnell Bureau as Consulting Physician (Optometry) Monna Fam, MD as Consulting Physician (Ophthalmology) Lucky Cowboy, Gordy Clement, NP as Nurse Practitioner (Hospice and Palliative Medicine) Virgie Dad, MD as Consulting Physician (Internal Medicine)  Extended Emergency Contact Information Primary Emergency Contact: [facility] Address: [id number], [place] [address] [name] of [place] Phone: [phone] Work Phone: [phone] Mobile Phone: [phone] Relation: Daughter  Code Status:  DNR Goals of care: Advanced Directive information Advanced Directives 03/16/2019  Does Patient Have a Medical Advance Directive? Yes  Type of [place] of [place];Living will;Out of facility DNR (pink MOST or yellow form)  Does patient want to make changes to medical advance directive? No - Patient declined  Copy of [place] in Chart? Yes - validated most recent copy scanned in chart (See row information)  Pre-existing out of facility DNR order (yellow form or pink MOST form) Yellow form placed in chart (order not valid for inpatient use)     Chief Complaint  Patient presents with  . Medical Management of Chronic Issues    Routine Visit     HPI:  Pt is a 86 y.o. female seen today for medical management of chronic diseases.    The patient resides in SNF [facility] for safety, care assistance, needs assistance with transfer, w/c for mobility, her mood/weight are stable, on Mirtazapine 7.5mg  qd, Risperidone 0.25mg  qd, Sertraline 125mg  qd. Hx of VTE/Protein C/S deficiency,  on Xarelto 15mg  qd. Constipation, stable, on MiraLax qd. GERD, stable on  Omeprazole 10mg  qd. Seizure: stable, on Keppra 250mg  bid. HTN, blood pressure is controlled on Metoprolol 12.5mg  bid.    Past Medical History:  Diagnosis Date  . Acute encephalopathy   . Aphasia following unspecified cerebrovascular disease   . 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 Depression   . Dysphagia following cerebrovascular disease   . Hepatic steatosis   . Hydroureteronephrosis   . Hypertension   . Insomnia   . Macular degeneration   . Major depressive disorder, recurrent ([place])   . Neuralgia and neuritis, unspecified (CODE)   . Nontraumatic intracranial hemorrhage ([place])   . Stroke ([place])   . 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   . 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 ABDOMINAL HYSTERECTOMY    . APPENDECTOMY    . BREAST SURGERY    . MASTECTOMY Right     Allergies  Allergen Reactions  . Hydrocodone Nausea And Vomiting  . Morphine And Related Nausea And Vomiting  . Actonel [Risedronate Sodium] Other (See Comments)    unknown  . Darvon [Propoxyphene Hcl] Other (See Comments)    unknown  . Oxycodone Hcl Other (See Comments)    unknown  . Pneumovax [Pneumococcal Polysaccharide Vaccine] Other (See Comments)    unknown    Allergies as of 03/16/2019      Reactions   Hydrocodone Nausea And Vomiting   Morphine And Related Nausea And Vomiting   Actonel [risedronate Sodium] Other (See Comments)   unknown  Darvon [propoxyphene Hcl] Other (See Comments)   unknown   Oxycodone Hcl Other (See Comments)   unknown   Pneumovax [pneumococcal Polysaccharide Vaccine] Other (See Comments)   unknown      Medication List       Accurate as of March 16, 2019  1:10 PM. If you have any questions, ask your nurse or doctor.        STOP taking these medications   NON FORMULARY Stopped by: Zipporah Finamore X Laurencia Roma, NP   NON FORMULARY Stopped by: Anikin Prosser X Creighton Longley,  NP   NON FORMULARY Stopped by: Sherrey North X Whitleigh Garramone, NP   NUTRITIONAL SHAKE PO Stopped by: Donjuan Robison X Trista Ciocca, NP   NUTRITIONAL SUPPLEMENTS PO Stopped by: Mansi Tokar X Makinsey Pepitone, NP     TAKE these medications   acetaminophen 325 MG tablet Commonly known as: TYLENOL Take 650 mg by mouth every 4 (four) hours as needed.   bisacodyl 10 MG suppository Commonly known as: DULCOLAX Place 10 mg rectally daily as needed for mild constipation or moderate constipation.   hydrocortisone 2.5 % lotion Apply topically 2 (two) times daily as needed. Apply to itchy  area on face.   levETIRAcetam 100 MG/ML solution Commonly known as: KEPPRA Take 250 mg by mouth. 250 mg = 2.5 ML; oral Twice A Day  Morning: 7am-10am Evening: 7pm-10pm   loratadine 10 MG tablet Commonly known as: CLARITIN Take 10 mg by mouth daily. 7am-10am.   metoprolol tartrate 25 MG tablet Commonly known as: LOPRESSOR Take 12.5 mg by mouth 2 (two) times daily. Morning: 7am-10am Evening: 7pm-10pm.   mirtazapine 15 MG tablet Commonly known as: REMERON Take 7.5 mg by mouth at bedtime. 7pm-10pm.   nystatin powder Generic drug: nystatin Apply 1 application topically daily as needed. 100,000unit Under left breast and back   nystatin cream Commonly known as: MYCOSTATIN Apply 1 application topically 2 (two) times daily.   omeprazole 10 MG capsule Commonly known as: PRILOSEC Take 10 mg by mouth daily. 7am-10am.   polyethylene glycol 17 g packet Commonly known as: MIRALAX / GLYCOLAX Take 17 g by mouth daily. 7am-10am.   risperiDONE 0.25 MG tablet Commonly known as: RISPERDAL Take 0.25 mg by mouth every evening. 7pm-10pm   Rivaroxaban 15 MG Tabs tablet Commonly known as: XARELTO Take 15 mg by mouth every evening. 4pm-6pm.   sertraline 100 MG tablet Commonly known as: ZOLOFT Take 100 mg by mouth at bedtime. 7pm-10pm. Take with 25 mg tablet   sertraline 25 MG tablet Commonly known as: ZOLOFT Take 25 mg by mouth at bedtime.  7pm-10pm. Give 25mg  along with 100mg  to =125mg .   simethicone 80 MG chewable tablet Commonly known as: MYLICON Chew 80 mg by mouth 3 (three) times daily with meals.   triamcinolone cream 0.5 % Commonly known as: KENALOG Apply 1 application topically 2 (two) times daily. lower back rash   triamcinolone cream 0.5 % Commonly known as: KENALOG Apply 1 application topically 2 (two) times daily as needed.      ROS was provided with assistance of staff . Review of Systems  Constitutional: Negative for activity change, appetite change, fatigue, fever and unexpected weight change.  HENT: Positive for hearing loss and trouble swallowing. Negative for congestion and voice change.   Eyes: Negative for visual disturbance.  Respiratory: Positive for cough. Negative for shortness of breath.        Chronic cough associated with swallowing is chronic.   Cardiovascular: Negative for chest pain and leg swelling.  Gastrointestinal: Negative for abdominal  pain, constipation, nausea and vomiting.  Genitourinary: Negative for difficulty urinating, dysuria and urgency.       Incontinent of urine.   Musculoskeletal: Positive for arthralgias and gait problem.  Skin: Negative for color change and rash.  Neurological: Negative for dizziness, speech difficulty, weakness and headaches.       Dementia.   Psychiatric/Behavioral: Positive for confusion. Negative for agitation, behavioral problems, hallucinations and sleep disturbance. The patient is not nervous/anxious.     Immunization History  Administered Date(s) Administered  . Influenza Whole 10/19/2017  . Influenza, High Dose Seasonal PF 10/08/2018  . Influenza-Unspecified 11/20/2013, 09/26/2014, 10/24/2015, 11/03/2016  . Moderna SARS-COVID-2 Vaccination 01/08/2019  . Tdap 09/19/2016  . Zoster 01/07/2012   Pertinent  Health Maintenance Due  Topic Date Due  . INFLUENZA VACCINE  Completed  . DEXA SCAN  Discontinued   Fall Risk  09/15/2017  09/05/2016  Falls in the past year? No No   Functional Status Survey:    Vitals:   03/16/19 1008  BP: 128/72  Pulse: 70  Resp: 20  Temp: (!) 96.9 F (36.1 C)  SpO2: 97%  Weight: 158 lb (71.7 kg)  Height: 5\' 3"  (1.6 m)   Body mass index is 27.99 kg/m. Physical Exam Vitals and nursing note reviewed.  Constitutional:      General: She is not in acute distress.    Appearance: Normal appearance. She is not ill-appearing.  HENT:     Head: Normocephalic and atraumatic.     Nose: Nose normal.     Mouth/Throat:     Mouth: Mucous membranes are moist.  Eyes:     Extraocular Movements: Extraocular movements intact.     Conjunctiva/sclera: Conjunctivae normal.     Pupils: Pupils are equal, round, and reactive to light.  Cardiovascular:     Rate and Rhythm: Normal rate and regular rhythm.     Heart sounds: No murmur.  Pulmonary:     Breath sounds: No wheezing or rales.  Abdominal:     General: Bowel sounds are normal. There is no distension.     Palpations: Abdomen is soft.     Tenderness: There is no abdominal tenderness. There is no guarding or rebound.  Musculoskeletal:     Cervical back: Normal range of motion and neck supple.     Right lower leg: No edema.     Left lower leg: No edema.  Skin:    General: Skin is warm and dry.  Neurological:     General: No focal deficit present.     Mental Status: She is alert. Mental status is at baseline.     Motor: No weakness.     Gait: Gait abnormal.     Comments: Oriented to self.   Psychiatric:     Comments: Confused. Followed simple directions.      Labs reviewed: Recent Labs    07/25/18 1032 07/26/18 0331 08/03/18 0000  NA 141 143 141  K 4.1 3.7 3.9  CL 110 113* 108  CO2 21* 22 23  GLUCOSE 108* 110*  --   BUN 9 7* 16  CREATININE 0.99 1.15* 1.1  CALCIUM 8.9 8.3* 8.7   Recent Labs    04/22/18 0000 07/25/18 1032 07/26/18 0331  AST 14 22 26   ALT 16 21 19   ALKPHOS 84 88 82  BILITOT  --  0.4 0.5  PROT  --   6.0* 5.7*  ALBUMIN  --  3.4* 3.2*   Recent Labs    07/25/18 1032  07/26/18 0331 08/03/18 0000  WBC 12.0* 7.7 7.1  HGB 15.0 13.2 14.3  HCT 47.5* 41.4 43  MCV 96.5 95.6  --   PLT 220 214 245   Lab Results  Component Value Date   TSH 1.32 02/16/2018   Lab Results  Component Value Date   HGBA1C 5.5 12/02/2016   Lab Results  Component Value Date   CHOL 174 10/21/2016   HDL 45 10/21/2016   LDLCALC 103 10/21/2016   TRIG 158 10/21/2016    Significant Diagnostic Results in last 30 days:  No results found.  Assessment/Plan Chronic embolism and thrombosis of unspecified vein In setting of protein C/S deficiency, continue chronic anticoagulation therapy with Xarelto 15mg  qd.   Essential hypertension, benign Blood pressure is controlled, continue Metoprolol 12.5mg  bid.   GERD (gastroesophageal reflux disease) Stable, continue Omeprazole 10mg  qd.   Vascular dementia with behavior disturbance ([place]) Continue memory care unit SNF for supportive care.   Rash Resolved on the lower back   Major depressive disorder, recurrent (HCC) Her mood and weight are stable, continue Sertraline, Risperidone, Mirtazapine.   Seizure ([place]) Stable, continue Keppra 250mg  bid.   Slow transit constipation Stable, continue MiraLax.      Family/ staff Communication: plan of care reviewed with the patient and charge nurse.   Labs/tests ordered:  none  Time spend 25 minutes.

## 2019-03-16 NOTE — Assessment & Plan Note (Signed)
Blood pressure is controlled, continue Metoprolol 12.5mg bid.  

## 2019-03-23 DIAGNOSIS — Z20828 Contact with and (suspected) exposure to other viral communicable diseases: Secondary | ICD-10-CM | POA: Diagnosis not present

## 2019-04-21 ENCOUNTER — Encounter: Payer: Self-pay | Admitting: Nurse Practitioner

## 2019-04-21 ENCOUNTER — Non-Acute Institutional Stay (SKILLED_NURSING_FACILITY): Payer: Medicare PPO | Admitting: Nurse Practitioner

## 2019-04-21 DIAGNOSIS — K5901 Slow transit constipation: Secondary | ICD-10-CM | POA: Diagnosis not present

## 2019-04-21 DIAGNOSIS — K219 Gastro-esophageal reflux disease without esophagitis: Secondary | ICD-10-CM | POA: Diagnosis not present

## 2019-04-21 DIAGNOSIS — R569 Unspecified convulsions: Secondary | ICD-10-CM

## 2019-04-21 DIAGNOSIS — I8291 Chronic embolism and thrombosis of unspecified vein: Secondary | ICD-10-CM | POA: Diagnosis not present

## 2019-04-21 DIAGNOSIS — F01518 Vascular dementia, unspecified severity, with other behavioral disturbance: Secondary | ICD-10-CM

## 2019-04-21 DIAGNOSIS — F331 Major depressive disorder, recurrent, moderate: Secondary | ICD-10-CM | POA: Diagnosis not present

## 2019-04-21 DIAGNOSIS — F0151 Vascular dementia with behavioral disturbance: Secondary | ICD-10-CM

## 2019-04-21 DIAGNOSIS — I10 Essential (primary) hypertension: Secondary | ICD-10-CM

## 2019-04-21 DIAGNOSIS — D6859 Other primary thrombophilia: Secondary | ICD-10-CM

## 2019-04-21 NOTE — Assessment & Plan Note (Signed)
Stable, continue MiraLax.  °

## 2019-04-21 NOTE — Assessment & Plan Note (Signed)
Continue Xarelto 

## 2019-04-21 NOTE — Assessment & Plan Note (Signed)
Blood pressure is controlled, continue Metoprolol. 

## 2019-04-21 NOTE — Progress Notes (Signed)
Location:   [place] Room Number: 60 Place of Service:  SNF (31) Provider:  Kemp Gomes X, NP  Carley Glendenning X, NP  Patient Care Team: Charis Juliana X, NP as PCP - General (Internal Medicine) Dione Mccombie X, NP as Nurse Practitioner (Internal Medicine) Idolina Primer, Warnell Bureau as Consulting Physician (Optometry) Monna Fam, MD as Consulting Physician (Ophthalmology) Lucky Cowboy, Gordy Clement, NP as Nurse Practitioner (Hospice and Palliative Medicine) Virgie Dad, MD as Consulting Physician (Internal Medicine)  Extended Emergency Contact Information Primary Emergency Contact: [facility] Address: [id number], [place] [address] [name] of [place] Phone: [phone] Work Phone: [phone] Mobile Phone: [phone] Relation: Daughter  Code Status:  DNR Goals of care: Advanced Directive information Advanced Directives 04/21/2019  Does Patient Have a Medical Advance Directive? Yes  Type of Advance Directive Out of facility DNR (pink MOST or yellow form);Living will;Healthcare Power of Attorney  Does patient want to make changes to medical advance directive? No - Patient declined  Copy of [place] in Chart? Yes - validated most recent copy scanned in chart (See row information)  Pre-existing out of facility DNR order (yellow form or pink MOST form) Yellow form placed in chart (order not valid for inpatient use)     Chief Complaint  Patient presents with  . Medical Management of Chronic Issues    HPI:  Pt is a 86 y.o. female seen today for medical management of chronic diseases.    The patient resides in SNF [facility] for safety, care assistance, her mood is stable, on Sertraline 125mg  qd ,Risperidone 0.25mg  qd, Mirtazapine 7.5mg  qd. No active seizure, stable on Keppra 250mg  bid. Hx of protein C/S deficiency/VTE, on chronic Xarelto. Constipation, stable, on MiraLax qd. GERD, stable, on Omeprazole 10mg  qd. HTN,  blood pressure is controlled on Metoprolol 12.5mg  bid.    Past Medical History:  Diagnosis Date  . Acute encephalopathy   . Aphasia following unspecified cerebrovascular disease   . 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 Depression   . Dysphagia following cerebrovascular disease   . Hepatic steatosis   . Hydroureteronephrosis   . Hypertension   . Insomnia   . Macular degeneration   . Major depressive disorder, recurrent ([place])   . Neuralgia and neuritis, unspecified (CODE)   . Nontraumatic intracranial hemorrhage ([place])   . Stroke ([place])   . 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   . 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 ABDOMINAL HYSTERECTOMY    . APPENDECTOMY    . BREAST SURGERY    . MASTECTOMY Right     Allergies  Allergen Reactions  . Hydrocodone Nausea And Vomiting  . Morphine And Related Nausea And Vomiting  . Actonel [Risedronate Sodium] Other (See Comments)    unknown  . Darvon [Propoxyphene Hcl] Other (See Comments)    unknown  . Oxycodone Hcl Other (See Comments)    unknown  . Pneumovax [Pneumococcal Polysaccharide Vaccine] Other (See Comments)    unknown    Allergies as of 04/21/2019      Reactions   Hydrocodone Nausea And Vomiting   Morphine And Related Nausea And Vomiting   Actonel [risedronate Sodium] Other (See Comments)   unknown   Darvon [propoxyphene Hcl] Other (See Comments)   unknown   Oxycodone  Hcl Other (See Comments)   unknown   Pneumovax [pneumococcal Polysaccharide Vaccine] Other (See Comments)   unknown      Medication List       Accurate as of April 21, 2019 11:59 PM. If you have any questions, ask your nurse or doctor.        acetaminophen 325 MG tablet Commonly known as: TYLENOL Take 650 mg by mouth every 4 (four) hours as needed.   bisacodyl 10 MG suppository Commonly known as: DULCOLAX Place 10 mg  rectally daily as needed for mild constipation or moderate constipation.   hydrocortisone 2.5 % lotion Apply topically 2 (two) times daily as needed. Apply to itchy  area on face.   levETIRAcetam 100 MG/ML solution Commonly known as: KEPPRA Take 250 mg by mouth. 250 mg = 2.5 ML; oral Twice A Day  Morning: 7am-10am Evening: 7pm-10pm   loratadine 10 MG tablet Commonly known as: CLARITIN Take 10 mg by mouth daily. 7am-10am.   metoprolol tartrate 25 MG tablet Commonly known as: LOPRESSOR Take 12.5 mg by mouth 2 (two) times daily. Morning: 7am-10am Evening: 7pm-10pm.   mirtazapine 15 MG tablet Commonly known as: REMERON Take 7.5 mg by mouth at bedtime. 7pm-10pm.   nystatin powder Generic drug: nystatin Apply 1 application topically daily as needed. 100,000unit Under left breast and back   nystatin cream Commonly known as: MYCOSTATIN Apply 1 application topically 2 (two) times daily.   omeprazole 10 MG capsule Commonly known as: PRILOSEC Take 10 mg by mouth daily. 7am-10am.   polyethylene glycol 17 g packet Commonly known as: MIRALAX / GLYCOLAX Take 17 g by mouth daily. 7am-10am.   risperiDONE 0.25 MG tablet Commonly known as: RISPERDAL Take 0.25 mg by mouth every evening. 7pm-10pm   Rivaroxaban 15 MG Tabs tablet Commonly known as: XARELTO Take 15 mg by mouth every evening. 4pm-6pm.   sertraline 100 MG tablet Commonly known as: ZOLOFT Take 100 mg by mouth at bedtime. 7pm-10pm. Take with 25 mg tablet   sertraline 25 MG tablet Commonly known as: ZOLOFT Take 25 mg by mouth at bedtime. 7pm-10pm. Give 25mg  along with 100mg  to =125mg .   simethicone 80 MG chewable tablet Commonly known as: MYLICON Chew 80 mg by mouth 3 (three) times daily with meals.   triamcinolone cream 0.5 % Commonly known as: KENALOG Apply 1 application topically 2 (two) times daily. lower back rash   triamcinolone cream 0.5 % Commonly known as: KENALOG Apply 1 application topically 2 (two)  times daily as needed.       Review of Systems  Constitutional: Negative for activity change, appetite change, fever and unexpected weight change.  HENT: Positive for hearing loss and trouble swallowing. Negative for congestion and voice change.   Eyes: Negative for visual disturbance.  Respiratory: Positive for cough. Negative for shortness of breath.        Chronic cough associated with swallowing is chronic.   Cardiovascular: Negative for leg swelling.  Gastrointestinal: Negative for abdominal pain and constipation.  Genitourinary: Negative for difficulty urinating, dysuria and urgency.       Incontinent of urine.   Musculoskeletal: Positive for arthralgias and gait problem.  Skin: Negative for color change.  Neurological: Positive for speech difficulty. Negative for seizures, weakness, light-headedness and headaches.       Dementia. Expressive aphasia.   Psychiatric/Behavioral: Positive for confusion. Negative for agitation, behavioral problems, hallucinations and sleep disturbance.    Immunization History  Administered Date(s) Administered  . Influenza Whole 10/19/2017  .  Influenza, High Dose Seasonal PF 10/08/2018  . Influenza-Unspecified 11/20/2013, 09/26/2014, 10/24/2015, 11/03/2016  . Moderna SARS-COVID-2 Vaccination 01/08/2019  . Tdap 09/19/2016  . Zoster 01/07/2012   Pertinent  Health Maintenance Due  Topic Date Due  . INFLUENZA VACCINE  08/07/2019  . DEXA SCAN  Discontinued   Fall Risk  09/15/2017 09/05/2016  Falls in the past year? No No   Functional Status Survey:    Vitals:   04/21/19 1543  BP: 140/72  Pulse: 72  Resp: 19  Temp: 97.7 F (36.5 C)  SpO2: 92%  Weight: 157 lb 8 oz (71.4 kg)  Height: 5\' 3"  (1.6 m)   Body mass index is 27.9 kg/m. Physical Exam Vitals and nursing note reviewed.  Constitutional:      Appearance: Normal appearance.  HENT:     Head: Normocephalic and atraumatic.     Mouth/Throat:     Mouth: Mucous membranes are moist.   Eyes:     Extraocular Movements: Extraocular movements intact.     Conjunctiva/sclera: Conjunctivae normal.     Pupils: Pupils are equal, round, and reactive to light.  Cardiovascular:     Rate and Rhythm: Normal rate and regular rhythm.     Heart sounds: No murmur.  Pulmonary:     Breath sounds: No wheezing or rales.  Abdominal:     General: Bowel sounds are normal. There is no distension.     Palpations: Abdomen is soft.     Tenderness: There is no abdominal tenderness. There is guarding.  Musculoskeletal:     Cervical back: Normal range of motion and neck supple.     Right lower leg: No edema.     Left lower leg: No edema.  Skin:    General: Skin is warm and dry.  Neurological:     General: No focal deficit present.     Mental Status: She is alert. Mental status is at baseline.     Gait: Gait abnormal.     Comments: Oriented to self.   Psychiatric:     Comments: Confused. Followed simple directions.      Labs reviewed: Recent Labs    07/25/18 1032 07/26/18 0331 08/03/18 0000  NA 141 143 141  K 4.1 3.7 3.9  CL 110 113* 108  CO2 21* 22 23  GLUCOSE 108* 110*  --   BUN 9 7* 16  CREATININE 0.99 1.15* 1.1  CALCIUM 8.9 8.3* 8.7   Recent Labs    07/25/18 1032 07/26/18 0331  AST 22 26  ALT 21 19  ALKPHOS 88 82  BILITOT 0.4 0.5  PROT 6.0* 5.7*  ALBUMIN 3.4* 3.2*   Recent Labs    07/25/18 1032 07/26/18 0331 08/03/18 0000  WBC 12.0* 7.7 7.1  HGB 15.0 13.2 14.3  HCT 47.5* 41.4 43  MCV 96.5 95.6  --   PLT 220 214 245   Lab Results  Component Value Date   TSH 1.32 02/16/2018   Lab Results  Component Value Date   HGBA1C 5.5 12/02/2016   Lab Results  Component Value Date   CHOL 174 10/21/2016   HDL 45 10/21/2016   LDLCALC 103 10/21/2016   TRIG 158 10/21/2016    Significant Diagnostic Results in last 30 days:  No results found.  Assessment/Plan Chronic embolism and thrombosis of unspecified vein Continue chronic anticoagulation with Xarelto in  setting protein C deficiency.   Essential hypertension, benign Blood pressure is controlled, continue Metoprolol   GERD (gastroesophageal reflux disease) Stable, continue Omeprazole  10mg  qd.   Slow transit constipation Stable, continue MiraLax  Vascular dementia with behavior disturbance ([place]) Continue SNF [facility] for safety, care assistance, w/c for mobility.    Protein C deficiency ([place]) Continue Xarelto  Protein S deficiency Continue Xarelto  Major depressive disorder, recurrent ([place]) Her mood is stable, continue Sertraline, Risperidone, Mirtazapine.   Seizure ([place]) Stable, continue Keppra     Family/ staff Communication: plan of care reviewed with the paint and charge nurse.   Labs/tests ordered:  none  Time spend 25 minutes.

## 2019-04-21 NOTE — Assessment & Plan Note (Addendum)
Her mood is stable, continue Sertraline, Risperidone, Mirtazapine.

## 2019-04-21 NOTE — Assessment & Plan Note (Signed)
Continue chronic anticoagulation with Xarelto in setting protein C deficiency.

## 2019-04-21 NOTE — Assessment & Plan Note (Signed)
Continue SNF [facility] for safety, care assistance, w/c for mobility.

## 2019-04-21 NOTE — Assessment & Plan Note (Signed)
Stable, continue Omeprazole 10mg qd.  

## 2019-04-21 NOTE — Assessment & Plan Note (Signed)
Stable, continue Keppra 

## 2019-05-10 ENCOUNTER — Encounter: Payer: Self-pay | Admitting: Internal Medicine

## 2019-05-10 ENCOUNTER — Non-Acute Institutional Stay (SKILLED_NURSING_FACILITY): Payer: Medicare PPO | Admitting: Internal Medicine

## 2019-05-10 DIAGNOSIS — I8291 Chronic embolism and thrombosis of unspecified vein: Secondary | ICD-10-CM

## 2019-05-10 DIAGNOSIS — R222 Localized swelling, mass and lump, trunk: Secondary | ICD-10-CM

## 2019-05-10 DIAGNOSIS — I10 Essential (primary) hypertension: Secondary | ICD-10-CM

## 2019-05-10 DIAGNOSIS — F0151 Vascular dementia with behavioral disturbance: Secondary | ICD-10-CM | POA: Diagnosis not present

## 2019-05-10 DIAGNOSIS — F01518 Vascular dementia, unspecified severity, with other behavioral disturbance: Secondary | ICD-10-CM

## 2019-05-10 NOTE — Progress Notes (Signed)
Location:  [place] Room Number: 109-A Place of Service:  SNF [phone]) Provider:  Veleta Miners, MD  Patient Care Team: Mast, Man X, NP as PCP - General (Internal Medicine) Mast, Man X, NP as Nurse Practitioner (Internal Medicine) Juluis Rainier as Consulting Physician (Optometry) Monna Fam, MD as Consulting Physician (Ophthalmology) Lucky Cowboy, Gordy Clement, NP as Nurse Practitioner (Hospice and Palliative Medicine) Virgie Dad, MD as Consulting Physician (Internal Medicine)  Extended Emergency Contact Information Primary Emergency Contact: [facility] Address: [id number] [place], [place] [address] [name] of [place] Phone: [phone] Work Phone: [phone] Mobile Phone: [phone] Relation: Daughter  Code Status:  DNR Goals of care: Advanced Directive information Advanced Directives 04/21/2019  Does Patient Have a Medical Advance Directive? Yes  Type of Advance Directive Out of facility DNR (pink MOST or yellow form);Living will;Healthcare Power of Attorney  Does patient want to make changes to medical advance directive? No - Patient declined  Copy of [place] in Chart? Yes - validated most recent copy scanned in chart (See row information)  Pre-existing out of facility DNR order (yellow form or pink MOST form) Yellow form placed in chart (order not valid for inpatient use)     Chief Complaint  Patient presents with  . Acute Visit    Patient is seen for a Lump Below the Left Breast    HPI:  Pt is an 86 y.o. female seen today for an acute visit for lump felt by Nurse Below the Left Breast  Patient has a history of CVA,vascular dementia with behavior issues, hypertension history of DVT on chronic Xarelto.H/o Seizures, S/P Right Mastectomy for Breast Cancer  Patient Lives in [facility]. Nurse Felt lump below the Left Breast.  Patient is not having any Pain  or any other comlains  Past Medical History:  Diagnosis Date  . Acute encephalopathy   . Aphasia following unspecified cerebrovascular disease   . 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 Depression   . Dysphagia following cerebrovascular disease   . Hepatic steatosis   . Hydroureteronephrosis   . Hypertension   . Insomnia   . Macular degeneration   . Major depressive disorder, recurrent ([place])   . Neuralgia and neuritis, unspecified (CODE)   . Nontraumatic intracranial hemorrhage ([place])   . Stroke ([place])   . 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   . 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 ABDOMINAL HYSTERECTOMY    . APPENDECTOMY    . BREAST SURGERY    . MASTECTOMY Right     Allergies  Allergen Reactions  . Hydrocodone Nausea And Vomiting  . Morphine And Related Nausea And Vomiting  . Actonel [Risedronate Sodium] Other (See Comments)    unknown  . Darvon [Propoxyphene Hcl] Other (See Comments)    unknown  . Oxycodone Hcl Other (See Comments)    unknown  . Pneumovax [Pneumococcal Polysaccharide Vaccine] Other (See Comments)    unknown    Outpatient Encounter Medications as of 05/10/2019  Medication Sig  . acetaminophen (TYLENOL) 325 MG tablet Take 650 mg by mouth every 4 (four) hours as needed.  . bisacodyl (DULCOLAX) 10 MG suppository Place 10 mg rectally daily as needed for mild constipation or moderate constipation.  . hydrocortisone 2.5 %  lotion Apply topically 2 (two) times daily as needed. Apply to itchy  area on face.  . levETIRAcetam (KEPPRA) 100 MG/ML solution Take 250 mg by mouth. 250 mg = 2.5 ML; oral Twice A Day  Morning: 7am-10am Evening: 7pm-10pm  . loratadine (CLARITIN) 10 MG tablet Take 10 mg by mouth daily. 7am-10am.  . metoprolol tartrate (LOPRESSOR) 25 MG tablet Take 12.5 mg by mouth 2 (two) times daily. Morning: 7am-10am Evening:  7pm-10pm.  . mirtazapine (REMERON) 15 MG tablet Take 7.5 mg by mouth at bedtime. 7pm-10pm.  . Nutritional Supplements (NUTRITIONAL SUPPLEMENT PO) Take 1 each by mouth in the morning and at bedtime. Magic Cup  . nystatin (NYSTATIN) powder Apply 1 application topically daily as needed. 100,000unit Under left breast  . nystatin cream (MYCOSTATIN) Apply 1 application topically See admin instructions. Mix with triamcinolone cream and apply to reddened area on back and buttocks with each continent episode and prn, then cover with zinc oxide barrier cream.  . omeprazole (PRILOSEC) 10 MG capsule Take 10 mg by mouth daily. 7am-10am.  . polyethylene glycol (MIRALAX / GLYCOLAX) packet Take 17 g by mouth daily. 7am-10am.  . risperiDONE (RISPERDAL) 0.25 MG tablet Take 0.25 mg by mouth every evening. 7pm-10pm  . Rivaroxaban (XARELTO) 15 MG TABS tablet Take 15 mg by mouth every evening. 4pm-6pm.  . sertraline (ZOLOFT) 100 MG tablet Take 100 mg by mouth at bedtime. 7pm-10pm. Take with 25 mg tablet  . sertraline (ZOLOFT) 25 MG tablet Take 25 mg by mouth at bedtime. 7pm-10pm. Give 25mg  along with 100mg  to =125mg .  . simethicone (MYLICON) 80 MG chewable tablet Chew 80 mg by mouth 3 (three) times daily with meals.  . triamcinolone cream (KENALOG) 0.5 % Apply 1 application topically See admin instructions. TID and PRN. Mix with Nystatin cream and apply to reddened areas on back and buttocks with each incontinent episode and prn, then cover with zinc oxide barrier cream  . [DISCONTINUED] triamcinolone cream (KENALOG) 0.5 % Apply 1 application topically 2 (two) times daily as needed.   No facility-administered encounter medications on file as of 05/10/2019.    Review of Systems  Unable to perform ROS: Dementia    Immunization History  Administered Date(s) Administered  . Influenza Whole 10/19/2017  . Influenza, High Dose Seasonal PF 10/08/2018  . Influenza-Unspecified 11/20/2013, 09/26/2014, 10/24/2015,  11/03/2016  . Moderna SARS-COVID-2 Vaccination 01/08/2019  . Tdap 09/19/2016  . Zoster 01/07/2012   Pertinent  Health Maintenance Due  Topic Date Due  . INFLUENZA VACCINE  08/07/2019  . DEXA SCAN  Discontinued   Fall Risk  09/15/2017 09/05/2016  Falls in the past year? No No   Functional Status Survey:    Vitals:   05/10/19 1227  BP: 124/64  Pulse: 62  Resp: 18  Temp: (!) 97.2 F (36.2 C)  TempSrc: Oral  SpO2: 93%  Weight: 155 lb 4.8 oz (70.4 kg)  Height: 5\' 3"  (1.6 m)   Body mass index is 27.51 kg/m. Physical Exam Vitals reviewed.  Constitutional:      Appearance: Normal appearance.  HENT:     Head: Normocephalic.     Nose: Nose normal.     Mouth/Throat:     Mouth: Mucous membranes are moist.     Pharynx: Oropharynx is clear.  Eyes:     Pupils: Pupils are equal, round, and reactive to light.  Cardiovascular:     Rate and Rhythm: Normal rate.     Pulses: Normal pulses.  Pulmonary:  Effort: Pulmonary effort is normal.     Breath sounds: Normal breath sounds.     Comments: Felt hard area below the Left breast which feels part of her Rib. Not tender or red Abdominal:     General: Abdomen is flat. Bowel sounds are normal.     Palpations: Abdomen is soft.  Musculoskeletal:     Cervical back: Neck supple.  Skin:    General: Skin is warm.  Neurological:     General: No focal deficit present.     Mental Status: She is alert.  Psychiatric:     Comments: Very Anxious     Labs reviewed: Recent Labs    07/25/18 1032 07/26/18 0331 08/03/18 0000  NA 141 143 141  K 4.1 3.7 3.9  CL 110 113* 108  CO2 21* 22 23  GLUCOSE 108* 110*  --   BUN 9 7* 16  CREATININE 0.99 1.15* 1.1  CALCIUM 8.9 8.3* 8.7   Recent Labs    07/25/18 1032 07/26/18 0331  AST 22 26  ALT 21 19  ALKPHOS 88 82  BILITOT 0.4 0.5  PROT 6.0* 5.7*  ALBUMIN 3.4* 3.2*   Recent Labs    07/25/18 1032 07/26/18 0331 08/03/18 0000  WBC 12.0* 7.7 7.1  HGB 15.0 13.2 14.3  HCT 47.5*  41.4 43  MCV 96.5 95.6  --   PLT 220 214 245   Lab Results  Component Value Date   TSH 1.32 02/16/2018   Lab Results  Component Value Date   HGBA1C 5.5 12/02/2016   Lab Results  Component Value Date   CHOL 174 10/21/2016   HDL 45 10/21/2016   LDLCALC 103 10/21/2016   TRIG 158 10/21/2016    Significant Diagnostic Results in last 30 days:  No results found.  Assessment/Plan  ? 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

## 2019-05-11 DIAGNOSIS — R079 Chest pain, unspecified: Secondary | ICD-10-CM | POA: Diagnosis not present

## 2019-05-23 ENCOUNTER — Non-Acute Institutional Stay (SKILLED_NURSING_FACILITY): Payer: Medicare PPO | Admitting: Nurse Practitioner

## 2019-05-23 ENCOUNTER — Encounter: Payer: Self-pay | Admitting: Nurse Practitioner

## 2019-05-23 ENCOUNTER — Other Ambulatory Visit: Payer: Self-pay | Admitting: *Deleted

## 2019-05-23 DIAGNOSIS — R569 Unspecified convulsions: Secondary | ICD-10-CM

## 2019-05-23 DIAGNOSIS — D6859 Other primary thrombophilia: Secondary | ICD-10-CM | POA: Diagnosis not present

## 2019-05-23 DIAGNOSIS — F331 Major depressive disorder, recurrent, moderate: Secondary | ICD-10-CM | POA: Diagnosis not present

## 2019-05-23 DIAGNOSIS — F0151 Vascular dementia with behavioral disturbance: Secondary | ICD-10-CM

## 2019-05-23 DIAGNOSIS — I10 Essential (primary) hypertension: Secondary | ICD-10-CM

## 2019-05-23 DIAGNOSIS — F01518 Vascular dementia, unspecified severity, with other behavioral disturbance: Secondary | ICD-10-CM

## 2019-05-23 MED ORDER — LORAZEPAM 0.5 MG PO TABS: ORAL_TABLET | ORAL | 0 refills | Status: DC

## 2019-05-23 NOTE — Assessment & Plan Note (Signed)
Blood pressure is controlled, continue Metoprolol. 

## 2019-05-23 NOTE — Progress Notes (Signed)
Location:   SNF [place] Room Number: [id number] of Service:  SNF (31) Provider: Lennie Odor Jahmeer Porche NP  Lean Fayson X, NP  Patient Care Team: Loukisha Gunnerson X, NP as PCP - General (Internal Medicine) Gershon Shorten X, NP as Nurse Practitioner (Internal Medicine) Idolina Primer, Warnell Bureau as Consulting Physician (Optometry) Monna Fam, MD as Consulting Physician (Ophthalmology) Lucky Cowboy, Gordy Clement, NP as Nurse Practitioner (Hospice and Palliative Medicine) Virgie Dad, MD as Consulting Physician (Internal Medicine)  Extended Emergency Contact Information Primary Emergency Contact: [facility] Address: [id number] [place], [place] [address] [name] of [place] Phone: [phone] Work Phone: [phone] Mobile Phone: [phone] Relation: Daughter  Code Status: DNR Goals of care: Advanced Directive information Advanced Directives 05/10/2019  Does Patient Have a Medical Advance Directive? Yes  Type of Paramedic of [place];Out of facility DNR (pink MOST or yellow form);Living will  Does patient want to make changes to medical advance directive? No - Patient declined  Copy of [place] in Chart? Yes - validated most recent copy scanned in chart (See row information)  Pre-existing out of facility DNR order (yellow form or pink MOST form) Yellow form placed in chart (order not valid for inpatient use)     Chief Complaint  Patient presents with  . Acute Visit    Evaluation of Lorazepam use    HPI:  Pt is a 86 y.o. female seen today for an acute visit for needing prn Lorazepam. The patient resides in memory care unit [facility] for safety, care assistance, she is in her usual state of health, the patient has occasional crying out, combative with ADL assistance like shower, incontinent care, etc, Lorazepam has been effective, last used was 05/14/19.    The patient resides in memory care unit [facility] for safety, care assistance,  her mood is managed on Mirtazapine 7.5mg  qd, Risperidone 0.25mg  qd, Sertraline 125mg  qd. No active seizures, on Keppra 25mg  qd. HTN, blood pressure is controlled, on Metoprolol 12.5mg  bid. Protein C/S deficiency, on chronic Xarelto.    Past Medical History:  Diagnosis Date  . Acute encephalopathy   . Aphasia following unspecified cerebrovascular disease   . 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 Depression   . Dysphagia following cerebrovascular disease   . Hepatic steatosis   . Hydroureteronephrosis   . Hypertension   . Insomnia   . Macular degeneration   . Major depressive disorder, recurrent ([place])   . Neuralgia and neuritis, unspecified (CODE)   . Nontraumatic intracranial hemorrhage ([place])   . Stroke ([place])   . 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   . 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 ABDOMINAL HYSTERECTOMY    . APPENDECTOMY    . BREAST SURGERY    . MASTECTOMY Right     Allergies  Allergen Reactions  . Hydrocodone Nausea And Vomiting  . Morphine And Related Nausea And Vomiting  . Actonel [Risedronate Sodium] Other (See Comments)    unknown  . Darvon [Propoxyphene Hcl] Other (See Comments)    unknown  . Oxycodone Hcl Other (See Comments)    unknown  . Pneumovax [Pneumococcal Polysaccharide Vaccine] Other (See Comments)    unknown    Allergies as of 05/23/2019      Reactions   Hydrocodone Nausea And Vomiting  Morphine And Related Nausea And Vomiting   Actonel [risedronate Sodium] Other (See Comments)   unknown   Darvon [propoxyphene Hcl] Other (See Comments)   unknown   Oxycodone Hcl Other (See Comments)   unknown   Pneumovax [pneumococcal Polysaccharide Vaccine] Other (See Comments)   unknown      Medication List       Accurate as of May 23, 2019 11:59 PM. If you have any questions, ask your nurse or doctor.          acetaminophen 325 MG tablet Commonly known as: TYLENOL Take 650 mg by mouth every 4 (four) hours as needed.   bisacodyl 10 MG suppository Commonly known as: DULCOLAX Place 10 mg rectally daily as needed for mild constipation or moderate constipation.   hydrocortisone 2.5 % lotion Apply topically 2 (two) times daily as needed. Apply to itchy  area on face.   levETIRAcetam 100 MG/ML solution Commonly known as: KEPPRA Take 250 mg by mouth. 250 mg = 2.5 ML; oral Twice A Day  Morning: 7am-10am Evening: 7pm-10pm   loratadine 10 MG tablet Commonly known as: CLARITIN Take 10 mg by mouth daily. 7am-10am.   LORazepam 0.5 MG tablet Commonly known as: ATIVAN Take one tablet by mouth twice daily as needed   metoprolol tartrate 25 MG tablet Commonly known as: LOPRESSOR Take 12.5 mg by mouth 2 (two) times daily. Morning: 7am-10am Evening: 7pm-10pm.   mirtazapine 15 MG tablet Commonly known as: REMERON Take 7.5 mg by mouth at bedtime. 7pm-10pm.   NUTRITIONAL SUPPLEMENT PO Take 1 each by mouth in the morning and at bedtime. Magic Cup   nystatin powder Generic drug: nystatin Apply 1 application topically daily as needed. 100,000unit Under left breast   nystatin cream Commonly known as: MYCOSTATIN Apply 1 application topically See admin instructions. Mix with triamcinolone cream and apply to reddened area on back and buttocks with each continent episode and prn, then cover with zinc oxide barrier cream.   omeprazole 10 MG capsule Commonly known as: PRILOSEC Take 10 mg by mouth daily. 7am-10am.   polyethylene glycol 17 g packet Commonly known as: MIRALAX / GLYCOLAX Take 17 g by mouth daily. 7am-10am.   risperiDONE 0.25 MG tablet Commonly known as: RISPERDAL Take 0.25 mg by mouth every evening. 7pm-10pm   Rivaroxaban 15 MG Tabs tablet Commonly known as: XARELTO Take 15 mg by mouth every evening. 4pm-6pm.   sertraline 100 MG tablet Commonly known as: ZOLOFT Take 100 mg by  mouth at bedtime. 7pm-10pm. Take with 25 mg tablet   sertraline 25 MG tablet Commonly known as: ZOLOFT Take 25 mg by mouth at bedtime. 7pm-10pm. Give 25mg  along with 100mg  to =125mg .   simethicone 80 MG chewable tablet Commonly known as: MYLICON Chew 80 mg by mouth 3 (three) times daily with meals.   triamcinolone cream 0.5 % Commonly known as: KENALOG Apply 1 application topically See admin instructions. TID and PRN. Mix with Nystatin cream and apply to reddened areas on back and buttocks with each incontinent episode and prn, then cover with zinc oxide barrier cream       Review of Systems  Constitutional: Negative for activity change, appetite change and fever.  HENT: Positive for hearing loss and trouble swallowing. Negative for congestion.   Eyes: Negative for visual disturbance.  Respiratory: Positive for cough.        Chronic cough associated with swallowing is chronic.   Cardiovascular: Positive for leg swelling.  Gastrointestinal: Negative for abdominal pain and constipation.  Genitourinary: Negative for difficulty urinating, dysuria and urgency.       Incontinent of urine.   Musculoskeletal: Positive for arthralgias and gait problem.  Skin: Negative for color change.  Neurological: Positive for speech difficulty. Negative for seizures, weakness and headaches.       Dementia. Expressive aphasia.   Psychiatric/Behavioral: Positive for agitation, behavioral problems and confusion. Negative for hallucinations and sleep disturbance.    Immunization History  Administered Date(s) Administered  . Influenza Whole 10/19/2017  . Influenza, High Dose Seasonal PF 10/08/2018  . Influenza-Unspecified 11/20/2013, 09/26/2014, 10/24/2015, 11/03/2016  . Moderna SARS-COVID-2 Vaccination 01/08/2019  . Tdap 09/19/2016  . Zoster 01/07/2012   Pertinent  Health Maintenance Due  Topic Date Due  . INFLUENZA VACCINE  08/07/2019  . DEXA SCAN  Discontinued   Fall Risk  09/15/2017  09/05/2016  Falls in the past year? No No   Functional Status Survey:    Vitals:   05/23/19 1451  BP: 112/64  Pulse: 70  Resp: 18  Temp: 98.7 F (37.1 C)  SpO2: 94%  Weight: 155 lb 4.8 oz (70.4 kg)  Height: 5\' 3"  (1.6 m)   Body mass index is 27.51 kg/m. Physical Exam Vitals and nursing note reviewed.  Constitutional:      Appearance: Normal appearance.  HENT:     Head: Normocephalic and atraumatic.     Mouth/Throat:     Mouth: Mucous membranes are moist.  Eyes:     Extraocular Movements: Extraocular movements intact.     Conjunctiva/sclera: Conjunctivae normal.     Pupils: Pupils are equal, round, and reactive to light.  Cardiovascular:     Rate and Rhythm: Normal rate and regular rhythm.     Heart sounds: No murmur.  Pulmonary:     Breath sounds: No rales.  Abdominal:     General: Bowel sounds are normal.     Palpations: Abdomen is soft.     Tenderness: There is no abdominal tenderness.  Musculoskeletal:     Cervical back: Normal range of motion and neck supple.     Right lower leg: Edema present.     Left lower leg: Edema present.     Comments: Trace edema BLE  Skin:    General: Skin is warm and dry.  Neurological:     General: No focal deficit present.     Mental Status: She is alert. Mental status is at baseline.     Gait: Gait abnormal.     Comments: Oriented to self.   Psychiatric:     Comments: Confused. Followed simple directions.      Labs reviewed: Recent Labs    07/25/18 1032 07/26/18 0331 08/03/18 0000  NA 141 143 141  K 4.1 3.7 3.9  CL 110 113* 108  CO2 21* 22 23  GLUCOSE 108* 110*  --   BUN 9 7* 16  CREATININE 0.99 1.15* 1.1  CALCIUM 8.9 8.3* 8.7   Recent Labs    07/25/18 1032 07/26/18 0331  AST 22 26  ALT 21 19  ALKPHOS 88 82  BILITOT 0.4 0.5  PROT 6.0* 5.7*  ALBUMIN 3.4* 3.2*   Recent Labs    07/25/18 1032 07/26/18 0331 08/03/18 0000  WBC 12.0* 7.7 7.1  HGB 15.0 13.2 14.3  HCT 47.5* 41.4 43  MCV 96.5 95.6  --     PLT 220 214 245   Lab Results  Component Value Date   TSH 1.32 02/16/2018   Lab Results  Component Value Date  HGBA1C 5.5 12/02/2016   Lab Results  Component Value Date   CHOL 174 10/21/2016   HDL 45 10/21/2016   LDLCALC 103 10/21/2016   TRIG 158 10/21/2016    Significant Diagnostic Results in last 30 days:  No results found.  Assessment/Plan: Major depressive disorder, recurrent ([place]) Occasionally emotional outburst, continue Sertraline, Risperidone, Mirtazapine,  continue prn Lorazepam 0.5mg  bid.   Essential hypertension, benign Blood pressure is controlled, continue Metoprolol.   Vascular dementia with behavior disturbance ([place]) Continue memory care unit [facility] for safety, care assistance.   Protein C deficiency (HCC) Protein C/S deficiency, chronic Xarelto.   Seizure ([place]) No active seizures, continue Keppra.     Family/ staff Communication: plan of care reviewed with the patient and charge nurse.   Labs/tests ordered:  None  Time spend 25 minutes.

## 2019-05-23 NOTE — Telephone Encounter (Signed)
Received fax from [facility] Rx and sent to [facility] for approval.    Assessment & Plan Note by Mast, Man X, NP at 05/23/2019 1:42 PM Author: Mast, Man X, NP Author Type: Nurse Practitioner Filed: 05/23/2019 1:43 PM  Note Status: Written Cosign: Cosign Not Required Encounter Date: 05/23/2019  Problem: Major depressive disorder, recurrent ([place])  Editor: Mast, Man X, NP (Nurse Practitioner)    Occasionally emotional outburst, continue Sertraline, Risperidone, Mirtazapine,  continue prn Lorazepam 0.5mg  bid.

## 2019-05-23 NOTE — Assessment & Plan Note (Signed)
Continue memory care unit [facility] for safety, care assistance.

## 2019-05-23 NOTE — Assessment & Plan Note (Signed)
Protein C/S deficiency, chronic Xarelto.

## 2019-05-23 NOTE — Assessment & Plan Note (Signed)
Occasionally emotional outburst, continue Sertraline, Risperidone, Mirtazapine,  continue prn Lorazepam 0.5mg  bid.

## 2019-05-23 NOTE — Assessment & Plan Note (Signed)
No active seizures, continue Keppra.

## 2019-05-24 ENCOUNTER — Encounter: Payer: Self-pay | Admitting: Nurse Practitioner

## 2019-05-31 ENCOUNTER — Non-Acute Institutional Stay (SKILLED_NURSING_FACILITY): Payer: Medicare PPO | Admitting: Internal Medicine

## 2019-05-31 ENCOUNTER — Encounter: Payer: Self-pay | Admitting: Internal Medicine

## 2019-05-31 DIAGNOSIS — F01518 Vascular dementia, unspecified severity, with other behavioral disturbance: Secondary | ICD-10-CM

## 2019-05-31 DIAGNOSIS — D6859 Other primary thrombophilia: Secondary | ICD-10-CM

## 2019-05-31 DIAGNOSIS — M79671 Pain in right foot: Secondary | ICD-10-CM | POA: Diagnosis not present

## 2019-05-31 DIAGNOSIS — R569 Unspecified convulsions: Secondary | ICD-10-CM

## 2019-05-31 DIAGNOSIS — B351 Tinea unguium: Secondary | ICD-10-CM | POA: Diagnosis not present

## 2019-05-31 DIAGNOSIS — F0151 Vascular dementia with behavioral disturbance: Secondary | ICD-10-CM | POA: Diagnosis not present

## 2019-05-31 DIAGNOSIS — M79672 Pain in left foot: Secondary | ICD-10-CM | POA: Diagnosis not present

## 2019-05-31 DIAGNOSIS — I10 Essential (primary) hypertension: Secondary | ICD-10-CM | POA: Diagnosis not present

## 2019-05-31 NOTE — Progress Notes (Signed)
Location:  [place] Room Number: [place]:  SNF [phone]) Provider:  Veleta Miners, MD  Patient Care Team: Mast, Man X, NP as PCP - General (Internal Medicine) Mast, Man X, NP as Nurse Practitioner (Internal Medicine) Juluis Rainier as Consulting Physician (Optometry) Monna Fam, MD as Consulting Physician (Ophthalmology) Lucky Cowboy, Gordy Clement, NP as Nurse Practitioner (Hospice and Palliative Medicine) Virgie Dad, MD as Consulting Physician (Internal Medicine)  Extended Emergency Contact Information Primary Emergency Contact: [facility] Address: [id number], [place] [address] [name] of [place] Phone: [phone] Work Phone: [phone] Mobile Phone: [phone] Relation: Daughter  Code Status:  SNF Goals of care: Advanced Directive information Advanced Directives 05/31/2019  Does Patient Have a Medical Advance Directive? Yes  Type of Paramedic of [place];Living will;Out of facility DNR (pink MOST or yellow form)  Does patient want to make changes to medical advance directive? No - Patient declined  Copy of [place] in Chart? Yes - validated most recent copy scanned in chart (See row information)  Pre-existing out of facility DNR order (yellow form or pink MOST form) Yellow form placed in chart (order not valid for inpatient use)     Chief Complaint  Patient presents with  . Medical Management of Chronic Issues    Routine [facility] visit           HPI:  Pt is an 86 y.o. female seen today for medical management of chronic diseases.   Patient has a history of CVA,vascular dementia with behavior issues, hypertension history of DVT on chronic Xarelto.H/o Seizures  [facility] Seen for regular visit Has no new nursing issues.  Weight is stable. She does have some behavior issues mostly in  the evening with anxiety Recently the nurses had felt some swelling below her breast.  Her daughter did not want any imaging.  So we had decided not to do any more work-up. Past Medical History:  Diagnosis Date  . Acute encephalopathy   . Aphasia following unspecified cerebrovascular disease   . 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 Depression   . Dysphagia following cerebrovascular disease   . Hepatic steatosis   . Hydroureteronephrosis   . Hypertension   . Insomnia   . Macular degeneration   . Major depressive disorder, recurrent ([place])   . Neuralgia and neuritis, unspecified (CODE)   . Nontraumatic intracranial hemorrhage ([place])   . Stroke ([place])   . 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   . 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 ABDOMINAL HYSTERECTOMY    . APPENDECTOMY    . BREAST SURGERY    . MASTECTOMY Right     Allergies  Allergen Reactions  . Hydrocodone Nausea And Vomiting  . Morphine And Related Nausea And Vomiting  . Actonel [Risedronate Sodium] Other (See Comments)    unknown  . Darvon [Propoxyphene Hcl] Other (See Comments)    unknown  . Oxycodone Hcl Other (See Comments)    unknown  . Pneumovax [Pneumococcal Polysaccharide Vaccine] Other (See Comments)    unknown    Outpatient Encounter Medications as of 05/31/2019  Medication Sig  . acetaminophen (TYLENOL) 325 MG tablet Take 650 mg by mouth every 4 (  four) hours as needed.  . bisacodyl (DULCOLAX) 10 MG suppository Place 10 mg rectally daily as needed for mild constipation or moderate constipation.  . hydrocortisone 2.5 % lotion Apply topically 2 (two) times daily as needed. Apply to itchy  area on face.  . levETIRAcetam (KEPPRA) 100 MG/ML solution Take 250 mg by mouth. 250 mg = 2.5 ML; oral Twice A Day  Morning: 7am-10am Evening: 7pm-10pm  . loratadine (CLARITIN) 10 MG tablet Take  10 mg by mouth daily. 7am-10am.  . LORazepam (ATIVAN) 0.5 MG tablet Take one tablet by mouth twice daily as needed  . metoprolol tartrate (LOPRESSOR) 25 MG tablet Take 12.5 mg by mouth 2 (two) times daily. Morning: 7am-10am Evening: 7pm-10pm.  . mirtazapine (REMERON) 15 MG tablet Take 7.5 mg by mouth at bedtime. 7pm-10pm.  . Nutritional Supplements (NUTRITIONAL SUPPLEMENT PO) Take 1 each by mouth in the morning and at bedtime. Magic Cup  . nystatin (NYSTATIN) powder Apply 1 application topically daily as needed. 100,000unit Under left breast  . nystatin cream (MYCOSTATIN) Apply 1 application topically See admin instructions. Mix with triamcinolone cream and apply to reddened area on back and buttocks with each continent episode and prn, then cover with zinc oxide barrier cream.  . omeprazole (PRILOSEC) 10 MG capsule Take 10 mg by mouth daily. 7am-10am.  . polyethylene glycol (MIRALAX / GLYCOLAX) packet Take 17 g by mouth daily. 7am-10am.  . risperiDONE (RISPERDAL) 0.25 MG tablet Take 0.25 mg by mouth every evening. 7pm-10pm  . Rivaroxaban (XARELTO) 15 MG TABS tablet Take 15 mg by mouth every evening. 4pm-6pm.  . sertraline (ZOLOFT) 100 MG tablet Take 100 mg by mouth at bedtime. 7pm-10pm. Take with 25 mg tablet  . sertraline (ZOLOFT) 25 MG tablet Take 25 mg by mouth at bedtime. 7pm-10pm. Give 25mg  along with 100mg  to =125mg .  . simethicone (MYLICON) 80 MG chewable tablet Chew 80 mg by mouth 3 (three) times daily with meals.  . triamcinolone cream (KENALOG) 0.5 % Apply 1 application topically See admin instructions. TID and PRN. Mix with Nystatin cream and apply to reddened areas on back and buttocks with each incontinent episode and prn, then cover with zinc oxide barrier cream   No facility-administered encounter medications on file as of 05/31/2019.    Review of Systems  Unable to perform ROS: Dementia    Immunization History  Administered Date(s) Administered  . Influenza Whole  10/19/2017  . Influenza, High Dose Seasonal PF 10/08/2018  . Influenza-Unspecified 11/20/2013, 09/26/2014, 10/24/2015, 11/03/2016  . Moderna SARS-COVID-2 Vaccination 01/08/2019  . Tdap 09/19/2016  . Zoster 01/07/2012   Pertinent  Health Maintenance Due  Topic Date Due  . INFLUENZA VACCINE  08/07/2019  . DEXA SCAN  Discontinued   Fall Risk  09/15/2017 09/05/2016  Falls in the past year? No No   Functional Status Survey:    Vitals:   05/31/19 1519  BP: 120/70  Pulse: 84  Resp: 18  Temp: 97.8 F (36.6 C)  TempSrc: Oral  SpO2: 96%  Weight: 155 lb 4.8 oz (70.4 kg)  Height: 5\' 3"  (1.6 m)   Body mass index is 27.51 kg/m. Physical Exam Vitals reviewed.  Constitutional:      Appearance: Normal appearance.  HENT:     Head: Normocephalic.     Nose: Nose normal.     Mouth/Throat:     Mouth: Mucous membranes are moist.     Pharynx: Oropharynx is clear.  Eyes:     Pupils: Pupils are equal, round, and  reactive to light.  Cardiovascular:     Rate and Rhythm: Normal rate.     Pulses: Normal pulses.  Pulmonary:     Effort: Pulmonary effort is normal. No respiratory distress.     Breath sounds: Normal breath sounds. No wheezing or rales.  Abdominal:     General: Abdomen is flat. Bowel sounds are normal.     Palpations: Abdomen is soft.  Musculoskeletal:        General: Swelling present.     Cervical back: Neck supple.  Skin:    General: Skin is warm.  Neurological:     General: No focal deficit present.     Mental Status: She is alert.  Psychiatric:     Comments: Gets Very Anxious     Labs reviewed: Recent Labs    07/25/18 1032 07/26/18 0331 08/03/18 0000  NA 141 143 141  K 4.1 3.7 3.9  CL 110 113* 108  CO2 21* 22 23  GLUCOSE 108* 110*  --   BUN 9 7* 16  CREATININE 0.99 1.15* 1.1  CALCIUM 8.9 8.3* 8.7   Recent Labs    07/25/18 1032 07/26/18 0331  AST 22 26  ALT 21 19  ALKPHOS 88 82  BILITOT 0.4 0.5  PROT 6.0* 5.7*  ALBUMIN 3.4* 3.2*   Recent Labs     07/25/18 1032 07/26/18 0331 08/03/18 0000  WBC 12.0* 7.7 7.1  HGB 15.0 13.2 14.3  HCT 47.5* 41.4 43  MCV 96.5 95.6  --   PLT 220 214 245   Lab Results  Component Value Date   TSH 1.32 02/16/2018   Lab Results  Component Value Date   HGBA1C 5.5 12/02/2016   Lab Results  Component Value Date   CHOL 174 10/21/2016   HDL 45 10/21/2016   LDLCALC 103 10/21/2016   TRIG 158 10/21/2016    Significant Diagnostic Results in last 30 days:  No results found.  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 Patient refuses due to Anxiety

## 2019-06-03 ENCOUNTER — Encounter: Payer: Self-pay | Admitting: Internal Medicine

## 2019-06-03 ENCOUNTER — Non-Acute Institutional Stay (SKILLED_NURSING_FACILITY): Payer: Medicare PPO | Admitting: Internal Medicine

## 2019-06-03 DIAGNOSIS — F0151 Vascular dementia with behavioral disturbance: Secondary | ICD-10-CM | POA: Diagnosis not present

## 2019-06-03 DIAGNOSIS — L299 Pruritus, unspecified: Secondary | ICD-10-CM | POA: Diagnosis not present

## 2019-06-03 DIAGNOSIS — F01518 Vascular dementia, unspecified severity, with other behavioral disturbance: Secondary | ICD-10-CM

## 2019-06-03 NOTE — Progress Notes (Signed)
Location:   [place] Room Number: [place]:  SNF (31) Provider:  [facility]  Mast, Man X, NP  Patient Care Team: Mast, Man X, NP as PCP - General (Internal Medicine) Mast, Man X, NP as Nurse Practitioner (Internal Medicine) Juluis Rainier as Consulting Physician (Optometry) Monna Fam, MD as Consulting Physician (Ophthalmology) Lucky Cowboy, Gordy Clement, NP as Nurse Practitioner (Hospice and Palliative Medicine) Virgie Dad, MD as Consulting Physician (Internal Medicine)  Extended Emergency Contact Information Primary Emergency Contact: [facility] Address: [id number] [place], [place] [address] [name] of [place] Phone: [phone] Work Phone: [phone] Mobile Phone: [phone] Relation: Daughter  Code Status:  DNR Goals of care: Advanced Directive information Advanced Directives 05/31/2019  Does Patient Have a Medical Advance Directive? Yes  Type of Paramedic of [place];Living will;Out of facility DNR (pink MOST or yellow form)  Does patient want to make changes to medical advance directive? No - Patient declined  Copy of [place] in Chart? Yes - validated most recent copy scanned in chart (See row information)  Pre-existing out of facility DNR order (yellow form or pink MOST form) Yellow form placed in chart (order not valid for inpatient use)     Chief Complaint  Patient presents with  . Acute Visit    Itching    HPI:  Pt is a 86 y.o. female seen today for an acute visit for itching  Patient has a history of CVA,vascular dementia with behavior issues, hypertension history of DVT on chronic Xarelto.H/o Seizures and history of breast cancer s/p mastectomy  Patient lives in [facility] Patient was noticed to be itching in her abdomen area and her hands.  Patient is unable to give any history due to her  dementia.  Per nurses there has not been any recent change in her medicine.  Patient has marks of her nail on both her hands and her abdomen.  Past Medical History:  Diagnosis Date  . Acute encephalopathy   . Aphasia following unspecified cerebrovascular disease   . 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 Depression   . Dysphagia following cerebrovascular disease   . Hepatic steatosis   . Hydroureteronephrosis   . Hypertension   . Insomnia   . Macular degeneration   . Major depressive disorder, recurrent ([place])   . Neuralgia and neuritis, unspecified (CODE)   . Nontraumatic intracranial hemorrhage ([place])   . Stroke ([place])   . 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   . 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 ABDOMINAL HYSTERECTOMY    . APPENDECTOMY    . BREAST SURGERY    . MASTECTOMY Right     Allergies  Allergen Reactions  . Hydrocodone Nausea And Vomiting  . Morphine And Related Nausea And Vomiting  . Actonel [Risedronate Sodium] Other (See Comments)    unknown  . Darvon [Propoxyphene Hcl] Other (See Comments)    unknown  . Oxycodone Hcl Other (See Comments)    unknown  . Pneumovax [Pneumococcal Polysaccharide Vaccine] Other (See Comments)    unknown    Allergies as of 06/03/2019      Reactions   Hydrocodone Nausea And Vomiting   Morphine And Related Nausea And Vomiting   Actonel [  risedronate Sodium] Other (See Comments)   unknown   Darvon [propoxyphene Hcl] Other (See Comments)   unknown   Oxycodone Hcl Other (See Comments)   unknown   Pneumovax [pneumococcal Polysaccharide Vaccine] Other (See Comments)   unknown      Medication List       Accurate as of Jun 03, 2019  4:42 PM. If you have any questions, ask your nurse or doctor.        acetaminophen 325 MG tablet Commonly known as: TYLENOL Take 650 mg by mouth every 4 (four) hours as  needed.   bisacodyl 10 MG suppository Commonly known as: DULCOLAX Place 10 mg rectally daily as needed for mild constipation or moderate constipation.   hydrocortisone 2.5 % lotion Apply topically 2 (two) times daily as needed. Apply to itchy  area on face.   levETIRAcetam 100 MG/ML solution Commonly known as: KEPPRA Take 250 mg by mouth. 250 mg = 2.5 ML; oral Twice A Day  Morning: 7am-10am Evening: 7pm-10pm   loratadine 10 MG tablet Commonly known as: CLARITIN Take 10 mg by mouth daily. 7am-10am.   LORazepam 0.5 MG tablet Commonly known as: ATIVAN Take one tablet by mouth twice daily as needed   metoprolol tartrate 25 MG tablet Commonly known as: LOPRESSOR Take 12.5 mg by mouth 2 (two) times daily. Morning: 7am-10am Evening: 7pm-10pm.   mirtazapine 15 MG tablet Commonly known as: REMERON Take 7.5 mg by mouth at bedtime. 7pm-10pm.   NUTRITIONAL SUPPLEMENT PO Take 1 each by mouth in the morning and at bedtime. Magic Cup   nystatin powder Generic drug: nystatin Apply 1 application topically daily as needed. 100,000unit Under left breast   nystatin cream Commonly known as: MYCOSTATIN Apply 1 application topically See admin instructions. Mix with triamcinolone cream and apply to reddened area on back and buttocks with each continent episode and prn, then cover with zinc oxide barrier cream.   omeprazole 10 MG capsule Commonly known as: PRILOSEC Take 10 mg by mouth daily. 7am-10am.   polyethylene glycol 17 g packet Commonly known as: MIRALAX / GLYCOLAX Take 17 g by mouth daily. 7am-10am.   risperiDONE 0.25 MG tablet Commonly known as: RISPERDAL Take 0.25 mg by mouth every evening. 7pm-10pm   Rivaroxaban 15 MG Tabs tablet Commonly known as: XARELTO Take 15 mg by mouth every evening. 4pm-6pm.   sertraline 100 MG tablet Commonly known as: ZOLOFT Take 100 mg by mouth at bedtime. 7pm-10pm. Take with 25 mg tablet   sertraline 25 MG tablet Commonly known as:  ZOLOFT Take 25 mg by mouth at bedtime. 7pm-10pm. Give 25mg  along with 100mg  to =125mg .   simethicone 80 MG chewable tablet Commonly known as: MYLICON Chew 80 mg by mouth 3 (three) times daily with meals.   triamcinolone cream 0.5 % Commonly known as: KENALOG Apply 1 application topically See admin instructions. TID and PRN. Mix with Nystatin cream and apply to reddened areas on back and buttocks with each incontinent episode and prn, then cover with zinc oxide barrier cream       Review of Systems  Unable to perform ROS: Dementia    Immunization History  Administered Date(s) Administered  . Influenza Whole 10/19/2017  . Influenza, High Dose Seasonal PF 10/08/2018  . Influenza-Unspecified 11/20/2013, 09/26/2014, 10/24/2015, 11/03/2016  . Moderna SARS-COVID-2 Vaccination 01/08/2019  . Tdap 09/19/2016  . Zoster 01/07/2012   Pertinent  Health Maintenance Due  Topic Date Due  . INFLUENZA VACCINE  08/07/2019  . DEXA SCAN  Discontinued  Fall Risk  09/15/2017 09/05/2016  Falls in the past year? No No   Functional Status Survey:    Vitals:   06/03/19 1632  BP: 120/60  Pulse: 84  Resp: 20  Temp: 98 F (36.7 C)  SpO2: 95%  Weight: 155 lb 4.8 oz (70.4 kg)  Height: 5\' 3"  (1.6 m)   Body mass index is 27.51 kg/m. Physical Exam Vitals reviewed.  Constitutional:      Appearance: Normal appearance.  HENT:     Head: Normocephalic.     Nose: Nose normal.     Mouth/Throat:     Mouth: Mucous membranes are moist.     Pharynx: Oropharynx is clear.  Eyes:     Pupils: Pupils are equal, round, and reactive to light.  Cardiovascular:     Rate and Rhythm: Normal rate.     Pulses: Normal pulses.     Heart sounds: Normal heart sounds.  Pulmonary:     Effort: Pulmonary effort is normal.     Breath sounds: Normal breath sounds.  Abdominal:     General: Abdomen is flat. Bowel sounds are normal.  Musculoskeletal:        General: Swelling present.  Skin:    General: Skin is  warm.     Comments: Patient has nail marks on her abdomen and her both hands.  Not on her back upper body or her lower extremities.  I did not appreciate any rash  Neurological:     General: No focal deficit present.     Mental Status: She is alert.  Psychiatric:        Mood and Affect: Mood normal.     Labs reviewed: Recent Labs    07/25/18 1032 07/26/18 0331 08/03/18 0000  NA 141 143 141  K 4.1 3.7 3.9  CL 110 113* 108  CO2 21* 22 23  GLUCOSE 108* 110*  --   BUN 9 7* 16  CREATININE 0.99 1.15* 1.1  CALCIUM 8.9 8.3* 8.7   Recent Labs    07/25/18 1032 07/26/18 0331  AST 22 26  ALT 21 19  ALKPHOS 88 82  BILITOT 0.4 0.5  PROT 6.0* 5.7*  ALBUMIN 3.4* 3.2*   Recent Labs    07/25/18 1032 07/26/18 0331 08/03/18 0000  WBC 12.0* 7.7 7.1  HGB 15.0 13.2 14.3  HCT 47.5* 41.4 43  MCV 96.5 95.6  --   PLT 220 214 245   Lab Results  Component Value Date   TSH 1.32 02/16/2018   Lab Results  Component Value Date   HGBA1C 5.5 12/02/2016   Lab Results  Component Value Date   CHOL 174 10/21/2016   HDL 45 10/21/2016   LDLCALC 103 10/21/2016   TRIG 158 10/21/2016    Significant Diagnostic Results in last 30 days:  No results found.  Assessment/Plan Itching Seems anxiety related.  I do not see any rash except the area where she has been picking herself.  No new meds  We will start her on triamcinolone cream to treat her symptoms. If continues she would probably need mittens in her hand. She is on Risperdal and Ativan for her behaviors also continues on Zoloft and Remeron Repeat labs are pending Vascular dementia with behavior disturbance (HCC) Continue on Risperdal and Ativan  Other Issues Essential hypertension, benign Been stable on Tenormin Cerebrovascular accident (CVA), Continue Xarelto Chronic embolism and thrombosiswith Protein C and S def Will continue on Xarelto Seizure Disorder Stable on Keppra Family/ staff Communication:  Labs/tests  ordered:

## 2019-06-09 DIAGNOSIS — F0391 Unspecified dementia with behavioral disturbance: Secondary | ICD-10-CM | POA: Diagnosis not present

## 2019-06-09 LAB — CBC: RBC: 4.45 (ref 3.87–5.11)

## 2019-06-09 LAB — BASIC METABOLIC PANEL
BUN: 12 (ref 4–21)
CO2: 22 (ref 13–22)
Chloride: 109 — AB (ref 99–108)
Creatinine: 1 (ref 0.5–1.1)
Glucose: 90
Potassium: 3.7 (ref 3.4–5.3)
Sodium: 142 (ref 137–147)

## 2019-06-09 LAB — COMPREHENSIVE METABOLIC PANEL
Calcium: 8.8 (ref 8.7–10.7)
Globulin: 2.1

## 2019-06-09 LAB — CBC AND DIFFERENTIAL
HCT: 40 (ref 36–46)
Hemoglobin: 13.6 (ref 12.0–16.0)
Neutrophils Absolute: 4264
Platelets: 242 (ref 150–399)
WBC: 6.5

## 2019-06-09 LAB — HEPATIC FUNCTION PANEL
ALT: 14 (ref 7–35)
AST: 14 (ref 13–35)
Alkaline Phosphatase: 94 (ref 25–125)
Bilirubin, Total: 0.3

## 2019-06-09 LAB — TSH: TSH: 2.36 (ref 0.41–5.90)

## 2019-06-19 ENCOUNTER — Emergency Department (HOSPITAL_COMMUNITY): Payer: Medicare PPO

## 2019-06-19 ENCOUNTER — Encounter (HOSPITAL_COMMUNITY): Payer: Self-pay | Admitting: Emergency Medicine

## 2019-06-19 ENCOUNTER — Other Ambulatory Visit: Payer: Self-pay

## 2019-06-19 ENCOUNTER — Inpatient Hospital Stay (HOSPITAL_COMMUNITY)
Admission: EM | Admit: 2019-06-19 | Discharge: 2019-06-24 | DRG: 871 | Disposition: A | Discharge status: Skilled Nursing Facility | Payer: Medicare PPO | Source: Skilled Nursing Facility | Attending: Internal Medicine | Admitting: Internal Medicine

## 2019-06-19 DIAGNOSIS — Z7901 Long term (current) use of anticoagulants: Secondary | ICD-10-CM | POA: Diagnosis not present

## 2019-06-19 DIAGNOSIS — A419 Sepsis, unspecified organism: Secondary | ICD-10-CM | POA: Diagnosis not present

## 2019-06-19 DIAGNOSIS — Z20822 Contact with and (suspected) exposure to covid-19: Secondary | ICD-10-CM | POA: Diagnosis present

## 2019-06-19 DIAGNOSIS — D6489 Other specified anemias: Secondary | ICD-10-CM | POA: Diagnosis present

## 2019-06-19 DIAGNOSIS — R0602 Shortness of breath: Secondary | ICD-10-CM | POA: Diagnosis not present

## 2019-06-19 DIAGNOSIS — E872 Acidosis, unspecified: Secondary | ICD-10-CM | POA: Diagnosis present

## 2019-06-19 DIAGNOSIS — A4151 Sepsis due to Escherichia coli [E. coli]: Principal | ICD-10-CM | POA: Diagnosis present

## 2019-06-19 DIAGNOSIS — F339 Major depressive disorder, recurrent, unspecified: Secondary | ICD-10-CM | POA: Diagnosis present

## 2019-06-19 DIAGNOSIS — K219 Gastro-esophageal reflux disease without esophagitis: Secondary | ICD-10-CM | POA: Diagnosis present

## 2019-06-19 DIAGNOSIS — Z515 Encounter for palliative care: Secondary | ICD-10-CM | POA: Diagnosis not present

## 2019-06-19 DIAGNOSIS — Z90710 Acquired absence of both cervix and uterus: Secondary | ICD-10-CM

## 2019-06-19 DIAGNOSIS — R0600 Dyspnea, unspecified: Secondary | ICD-10-CM

## 2019-06-19 DIAGNOSIS — I69351 Hemiplegia and hemiparesis following cerebral infarction affecting right dominant side: Secondary | ICD-10-CM | POA: Diagnosis not present

## 2019-06-19 DIAGNOSIS — D6859 Other primary thrombophilia: Secondary | ICD-10-CM | POA: Diagnosis present

## 2019-06-19 DIAGNOSIS — G40909 Epilepsy, unspecified, not intractable, without status epilepticus: Secondary | ICD-10-CM | POA: Diagnosis present

## 2019-06-19 DIAGNOSIS — N201 Calculus of ureter: Secondary | ICD-10-CM | POA: Diagnosis not present

## 2019-06-19 DIAGNOSIS — Z66 Do not resuscitate: Secondary | ICD-10-CM | POA: Diagnosis present

## 2019-06-19 DIAGNOSIS — R404 Transient alteration of awareness: Secondary | ICD-10-CM | POA: Diagnosis not present

## 2019-06-19 DIAGNOSIS — Z79899 Other long term (current) drug therapy: Secondary | ICD-10-CM

## 2019-06-19 DIAGNOSIS — K760 Fatty (change of) liver, not elsewhere classified: Secondary | ICD-10-CM | POA: Diagnosis not present

## 2019-06-19 DIAGNOSIS — R1310 Dysphagia, unspecified: Secondary | ICD-10-CM | POA: Diagnosis not present

## 2019-06-19 DIAGNOSIS — Z7189 Other specified counseling: Secondary | ICD-10-CM | POA: Diagnosis not present

## 2019-06-19 DIAGNOSIS — Z888 Allergy status to other drugs, medicaments and biological substances status: Secondary | ICD-10-CM

## 2019-06-19 DIAGNOSIS — K810 Acute cholecystitis: Secondary | ICD-10-CM | POA: Diagnosis present

## 2019-06-19 DIAGNOSIS — F331 Major depressive disorder, recurrent, moderate: Secondary | ICD-10-CM | POA: Diagnosis not present

## 2019-06-19 DIAGNOSIS — R159 Full incontinence of feces: Secondary | ICD-10-CM | POA: Diagnosis present

## 2019-06-19 DIAGNOSIS — N390 Urinary tract infection, site not specified: Secondary | ICD-10-CM | POA: Diagnosis present

## 2019-06-19 DIAGNOSIS — J690 Pneumonitis due to inhalation of food and vomit: Secondary | ICD-10-CM | POA: Diagnosis present

## 2019-06-19 DIAGNOSIS — N202 Calculus of kidney with calculus of ureter: Secondary | ICD-10-CM | POA: Diagnosis present

## 2019-06-19 DIAGNOSIS — R000 Tachycardia, unspecified: Secondary | ICD-10-CM | POA: Diagnosis not present

## 2019-06-19 DIAGNOSIS — Z1621 Resistance to vancomycin: Secondary | ICD-10-CM | POA: Diagnosis present

## 2019-06-19 DIAGNOSIS — R627 Adult failure to thrive: Secondary | ICD-10-CM | POA: Diagnosis not present

## 2019-06-19 DIAGNOSIS — R6510 Systemic inflammatory response syndrome (SIRS) of non-infectious origin without acute organ dysfunction: Secondary | ICD-10-CM

## 2019-06-19 DIAGNOSIS — R4182 Altered mental status, unspecified: Secondary | ICD-10-CM | POA: Diagnosis not present

## 2019-06-19 DIAGNOSIS — Z885 Allergy status to narcotic agent status: Secondary | ICD-10-CM

## 2019-06-19 DIAGNOSIS — R17 Unspecified jaundice: Secondary | ICD-10-CM

## 2019-06-19 DIAGNOSIS — B961 Klebsiella pneumoniae [K. pneumoniae] as the cause of diseases classified elsewhere: Secondary | ICD-10-CM | POA: Diagnosis present

## 2019-06-19 DIAGNOSIS — E869 Volume depletion, unspecified: Secondary | ICD-10-CM | POA: Diagnosis present

## 2019-06-19 DIAGNOSIS — M2550 Pain in unspecified joint: Secondary | ICD-10-CM | POA: Diagnosis not present

## 2019-06-19 DIAGNOSIS — J9601 Acute respiratory failure with hypoxia: Secondary | ICD-10-CM | POA: Diagnosis present

## 2019-06-19 DIAGNOSIS — R0689 Other abnormalities of breathing: Secondary | ICD-10-CM | POA: Diagnosis not present

## 2019-06-19 DIAGNOSIS — R32 Unspecified urinary incontinence: Secondary | ICD-10-CM | POA: Diagnosis present

## 2019-06-19 DIAGNOSIS — R6520 Severe sepsis without septic shock: Secondary | ICD-10-CM | POA: Diagnosis present

## 2019-06-19 DIAGNOSIS — F01518 Vascular dementia, unspecified severity, with other behavioral disturbance: Secondary | ICD-10-CM | POA: Diagnosis present

## 2019-06-19 DIAGNOSIS — R0902 Hypoxemia: Secondary | ICD-10-CM | POA: Diagnosis not present

## 2019-06-19 DIAGNOSIS — I10 Essential (primary) hypertension: Secondary | ICD-10-CM

## 2019-06-19 DIAGNOSIS — J9811 Atelectasis: Secondary | ICD-10-CM | POA: Diagnosis not present

## 2019-06-19 DIAGNOSIS — H3530 Unspecified macular degeneration: Secondary | ICD-10-CM | POA: Diagnosis present

## 2019-06-19 DIAGNOSIS — R7401 Elevation of levels of liver transaminase levels: Secondary | ICD-10-CM | POA: Diagnosis not present

## 2019-06-19 DIAGNOSIS — A4150 Gram-negative sepsis, unspecified: Secondary | ICD-10-CM | POA: Diagnosis present

## 2019-06-19 DIAGNOSIS — Z887 Allergy status to serum and vaccine status: Secondary | ICD-10-CM

## 2019-06-19 DIAGNOSIS — Z7401 Bed confinement status: Secondary | ICD-10-CM | POA: Diagnosis not present

## 2019-06-19 DIAGNOSIS — E876 Hypokalemia: Secondary | ICD-10-CM | POA: Diagnosis present

## 2019-06-19 DIAGNOSIS — Z9011 Acquired absence of right breast and nipple: Secondary | ICD-10-CM

## 2019-06-19 DIAGNOSIS — Z86718 Personal history of other venous thrombosis and embolism: Secondary | ICD-10-CM

## 2019-06-19 DIAGNOSIS — F0151 Vascular dementia with behavioral disturbance: Secondary | ICD-10-CM | POA: Diagnosis present

## 2019-06-19 DIAGNOSIS — R509 Fever, unspecified: Secondary | ICD-10-CM | POA: Diagnosis not present

## 2019-06-19 DIAGNOSIS — Z0389 Encounter for observation for other suspected diseases and conditions ruled out: Secondary | ICD-10-CM | POA: Diagnosis not present

## 2019-06-19 DIAGNOSIS — I5031 Acute diastolic (congestive) heart failure: Secondary | ICD-10-CM | POA: Diagnosis not present

## 2019-06-19 DIAGNOSIS — K5901 Slow transit constipation: Secondary | ICD-10-CM | POA: Diagnosis not present

## 2019-06-19 DIAGNOSIS — I371 Nonrheumatic pulmonary valve insufficiency: Secondary | ICD-10-CM | POA: Diagnosis present

## 2019-06-19 DIAGNOSIS — R945 Abnormal results of liver function studies: Secondary | ICD-10-CM | POA: Diagnosis not present

## 2019-06-19 DIAGNOSIS — Z853 Personal history of malignant neoplasm of breast: Secondary | ICD-10-CM

## 2019-06-19 LAB — CBC WITH DIFFERENTIAL/PLATELET
Abs Immature Granulocytes: 0.04 10*3/uL (ref 0.00–0.07)
Basophils Absolute: 0 10*3/uL (ref 0.0–0.1)
Basophils Relative: 0 %
Eosinophils Absolute: 0 10*3/uL (ref 0.0–0.5)
Eosinophils Relative: 0 %
HCT: 48.4 % — ABNORMAL HIGH (ref 36.0–46.0)
Hemoglobin: 15.2 g/dL — ABNORMAL HIGH (ref 12.0–15.0)
Immature Granulocytes: 1 %
Lymphocytes Relative: 4 %
Lymphs Abs: 0.3 10*3/uL — ABNORMAL LOW (ref 0.7–4.0)
MCH: 30.2 pg (ref 26.0–34.0)
MCHC: 31.4 g/dL (ref 30.0–36.0)
MCV: 96.2 fL (ref 80.0–100.0)
Monocytes Absolute: 0.4 10*3/uL (ref 0.1–1.0)
Monocytes Relative: 5 %
Neutro Abs: 7 10*3/uL (ref 1.7–7.7)
Neutrophils Relative %: 90 %
Platelets: 222 10*3/uL (ref 150–400)
RBC: 5.03 MIL/uL (ref 3.87–5.11)
RDW: 14.9 % (ref 11.5–15.5)
WBC: 7.7 10*3/uL (ref 4.0–10.5)
nRBC: 0 % (ref 0.0–0.2)

## 2019-06-19 LAB — COMPREHENSIVE METABOLIC PANEL
ALT: 157 U/L — ABNORMAL HIGH (ref 0–44)
AST: 231 U/L — ABNORMAL HIGH (ref 15–41)
Albumin: 3.6 g/dL (ref 3.5–5.0)
Alkaline Phosphatase: 152 U/L — ABNORMAL HIGH (ref 38–126)
Anion gap: 16 — ABNORMAL HIGH (ref 5–15)
BUN: 14 mg/dL (ref 8–23)
CO2: 20 mmol/L — ABNORMAL LOW (ref 22–32)
Calcium: 8.4 mg/dL — ABNORMAL LOW (ref 8.9–10.3)
Chloride: 111 mmol/L (ref 98–111)
Creatinine, Ser: 1.28 mg/dL — ABNORMAL HIGH (ref 0.44–1.00)
GFR calc Af Amer: 43 mL/min — ABNORMAL LOW (ref >60)
GFR calc non Af Amer: 37 mL/min — ABNORMAL LOW (ref >60)
Glucose, Bld: 159 mg/dL — ABNORMAL HIGH (ref 70–99)
Potassium: 3.1 mmol/L — ABNORMAL LOW (ref 3.5–5.1)
Sodium: 147 mmol/L — ABNORMAL HIGH (ref 135–145)
Total Bilirubin: 2.3 mg/dL — ABNORMAL HIGH (ref 0.3–1.2)
Total Protein: 6.4 g/dL — ABNORMAL LOW (ref 6.5–8.1)

## 2019-06-19 LAB — LACTIC ACID, PLASMA: Lactic Acid, Venous: 5.5 mmol/L (ref 0.5–1.9)

## 2019-06-19 LAB — APTT: aPTT: 27 seconds (ref 24–36)

## 2019-06-19 LAB — PROTIME-INR
INR: 1.3 — ABNORMAL HIGH (ref 0.8–1.2)
Prothrombin Time: 15.3 seconds — ABNORMAL HIGH (ref 11.4–15.2)

## 2019-06-19 MED ORDER — SODIUM CHLORIDE 0.9 % IV SOLN
3.0000 g | Freq: Once | INTRAVENOUS | Status: AC
  Administered 2019-06-19: 3 g via INTRAVENOUS
  Filled 2019-06-19: qty 8

## 2019-06-19 MED ORDER — PIPERACILLIN-TAZOBACTAM 3.375 G IVPB 30 MIN
3.3750 g | Freq: Once | INTRAVENOUS | Status: AC
  Administered 2019-06-19: 3.375 g via INTRAVENOUS
  Filled 2019-06-19: qty 50

## 2019-06-19 MED ORDER — ACETAMINOPHEN 650 MG RE SUPP
650.0000 mg | Freq: Once | RECTAL | Status: AC
  Administered 2019-06-19: 650 mg via RECTAL
  Filled 2019-06-19: qty 1

## 2019-06-19 MED ORDER — IOHEXOL 300 MG/ML  SOLN
80.0000 mL | Freq: Once | INTRAMUSCULAR | Status: AC | PRN
  Administered 2019-06-19: 80 mL via INTRAVENOUS

## 2019-06-19 MED ORDER — SODIUM CHLORIDE 0.9 % IV BOLUS
1000.0000 mL | Freq: Once | INTRAVENOUS | Status: AC
  Administered 2019-06-19: 1000 mL via INTRAVENOUS

## 2019-06-19 MED ORDER — ACETAMINOPHEN 325 MG PO TABS: 650.0000 mg | ORAL_TABLET | Freq: Once | ORAL | Status: DC

## 2019-06-19 NOTE — ED Triage Notes (Signed)
Pt arrived via EMS from [place]. Pt was found unresponsive in bed. Pt has hx of dementia, but is altered from baseline. Pt is code sepsis per EMS. Pt has hx of stroke and difficulty swallowing

## 2019-06-19 NOTE — ED Provider Notes (Signed)
[facility] DEPT Provider Note   CSN: [id number] Arrival date & time: 06/19/19  2143     History CC: Hypoxia, vomiting  Erica [name] is a 86 y.o. female history of significant dementia, DVT on Xarelto, presenting to emergency department with vomiting and fever. Patient cannot provide reliable history.  HIstorty provided by EMS and patient's daughter.  Patient presents from [facility] unit.  She was noted have several episodes of vomiting in her bed yesterday evening.  Her daughter at bedside tells me the patient had a particularly aggressive behavioral episode the night before and had to be given multiple rounds of Ativan for sedation.    EMS was called out there were concerns the patient was hypoxic with O2 sats as low as 84%, required nasal cannula and then full nonrebreather to maintain her oxygen sats.  They noted she had vomited just prior to their arrival.  Patient does have a history of stroke and difficulty swallowing.   Her daughter confirms that the patient is DNR/DNI.  She does report that the patient is on hospice, however she would like to pursue hospitalization and potential surgical intervention if needed, and asked to be updated for any major changes in care.  HPI     Past Medical History:  Diagnosis Date  . Acute encephalopathy   . Aphasia following unspecified cerebrovascular disease   . 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 Depression   . Dysphagia following cerebrovascular disease   . Hepatic steatosis   . Hydroureteronephrosis   . Hypertension   . Insomnia   . Macular degeneration   . Major depressive disorder, recurrent ([place])   . Neuralgia and neuritis, unspecified ([place])   . Nontraumatic intracranial hemorrhage ([place])   . Stroke ([place])   . 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   . 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    right    Patient Active  Problem List   Diagnosis Date Noted  . Allergic rhinitis 02/21/2019  . Rash 01/26/2019  . Urinary incontinence 01/26/2019  . Slow transit constipation 01/19/2019  . Compression fracture of T9 vertebra ([place]) 07/26/2018  . Seizure ([place]) 07/25/2018  . Adult failure to thrive 06/11/2017  . Gait abnormality 12/08/2016  . Psychosis ([place]) 08/21/2016  . Hallucination 08/14/2016  . Vascular dementia with behavior disturbance ([place]) 07/22/2016  . Dysphagia following cerebrovascular disease   . 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 Nontraumatic intracranial hemorrhage ([place])   . 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   . Major depressive disorder, recurrent ([place])   . SCC (squamous cell carcinoma), scalp/neck 11/10/2013  . GERD (gastroesophageal reflux disease) 09/29/2012  . Protein C deficiency ([place]) 06/14/2012  . Protein S deficiency ([place]) 06/14/2012  . DNR (do not resuscitate) 06/14/2012  . Essential hypertension, benign 03/24/2012  . CVA (cerebral vascular accident) ([place]) 03/24/2012  . Osteoporosis 03/24/2012  . Macular degeneration 03/24/2012  . Aphasia 03/24/2012  . Dyslipidemia 03/24/2012    Past Surgical History:  Procedure Laterality Date  . ABDOMINAL HYSTERECTOMY    . APPENDECTOMY    . BREAST SURGERY    . MASTECTOMY Right      OB History   No obstetric history on file.     No family history on file.  Social History   Tobacco Use  . Smoking status: Never Smoker  . Smokeless tobacco: Never Used  Substance Use  Topics  . Alcohol use: No  . Drug use: No    Home Medications Prior to Admission medications   Medication Sig Start Date End Date Taking? Authorizing Provider  acetaminophen (TYLENOL) 325 MG tablet Take 650 mg by mouth every 4 (four) hours as needed.    [provider]  bisacodyl (DULCOLAX) 10 MG suppository Place 10 mg rectally daily as needed for mild constipation or moderate constipation.    [provider]  hydrocortisone 2.5 % lotion Apply topically 2 (two) times daily as needed. Apply to itchy  area on face.    [provider]  levETIRAcetam (KEPPRA) 100 MG/ML solution Take 250 mg by mouth. 250 mg = 2.5 ML; oral Twice A Day  Morning: 7am-10am Evening: 7pm-10pm    [provider]  loratadine (CLARITIN) 10 MG tablet Take 10 mg by mouth daily. 7am-10am.    [provider]  LORazepam (ATIVAN) 0.5 MG tablet Take one tablet by mouth twice daily as needed 05/23/19   Mast, Man X, NP  metoprolol tartrate (LOPRESSOR) 25 MG tablet Take 12.5 mg by mouth 2 (two) times daily. Morning: 7am-10am Evening: 7pm-10pm.    [provider]  mirtazapine (REMERON) 15 MG tablet Take 7.5 mg by mouth at bedtime. 7pm-10pm.    [provider]  Nutritional Supplements (NUTRITIONAL SUPPLEMENT PO) Take 1 each by mouth in the morning and at bedtime. Magic Cup    [provider]  nystatin (NYSTATIN) powder Apply 1 application topically daily as needed. 100,000unit Under left breast    [provider]  nystatin cream (MYCOSTATIN) Apply 1 application topically See admin instructions. Mix with triamcinolone cream and apply to reddened area on back and buttocks with each continent episode and prn, then cover with zinc oxide barrier cream.    [provider]  omeprazole (PRILOSEC) 10 MG capsule Take 10 mg by mouth daily. 7am-10am.    [provider]  polyethylene glycol (MIRALAX / GLYCOLAX) packet Take 17 g by mouth daily. 7am-10am.    [provider]  risperiDONE (RISPERDAL) 0.25 MG tablet Take 0.25 mg by mouth every evening. 7pm-10pm    [provider]  Rivaroxaban (XARELTO) 15 MG TABS tablet Take 15 mg by mouth every evening. 4pm-6pm.    [provider]  sertraline (ZOLOFT) 100 MG tablet Take 100 mg by mouth at bedtime. 7pm-10pm. Take with 25 mg tablet 08/19/18   [provider]  sertraline (ZOLOFT) 25 MG  tablet Take 25 mg by mouth at bedtime. 7pm-10pm. Give 25mg  along with 100mg  to =125mg . 08/19/18   [provider]  simethicone (MYLICON) 80 MG chewable tablet Chew 80 mg by mouth 3 (three) times daily with meals.    [provider]  triamcinolone cream (KENALOG) 0.5 % Apply 1 application topically See admin instructions. TID and PRN. Mix with Nystatin cream and apply to reddened areas on back and buttocks with each incontinent episode and prn, then cover with zinc oxide barrier cream    [provider]    Allergies    Hydrocodone, Morphine and related, Actonel [risedronate sodium], Darvon [propoxyphene hcl], Oxycodone hcl, and Pneumovax [pneumococcal polysaccharide vaccine]  Review of Systems   Review of Systems  Unable to perform ROS: Dementia (level 5 caveat)    Physical Exam Updated Vital Signs There were no vitals taken for this visit.  Physical Exam Vitals and nursing note reviewed.  Constitutional:      General: She is not in acute distress.  Appearance: She is well-developed.     Comments: Demented, can answer simple questions  HENT:     Head: Normocephalic and atraumatic.  Eyes:     Conjunctiva/sclera: Conjunctivae normal.  Cardiovascular:     Rate and Rhythm: Normal rate and regular rhythm.     Pulses: Normal pulses.  Pulmonary:     Effort: Pulmonary effort is normal. No respiratory distress.     Comments: Rales in lung base Abdominal:     General: There is no distension.     Palpations: Abdomen is soft.     Tenderness: There is no abdominal tenderness.  Musculoskeletal:     Cervical back: Neck supple.  Skin:    General: Skin is warm and dry.  Neurological:     Mental Status: She is alert.     ED Results / Procedures / Treatments   Labs (all labs ordered are listed, but only abnormal results are displayed) Labs Reviewed - No data to display  EKG None  Radiology No results found.  Procedures .Critical Care Performed by:  Wyvonnia Dusky, MD Authorized by: Wyvonnia Dusky, MD   Critical care provider statement:    Critical care time (minutes):  45   Critical care was necessary to treat or prevent imminent or life-threatening deterioration of the following conditions:  Sepsis   Critical care was time spent personally by me on the following activities:  Discussions with consultants, evaluation of patient's response to treatment, examination of patient, ordering and performing treatments and interventions, ordering and review of laboratory studies, ordering and review of radiographic studies, pulse oximetry, re-evaluation of patient's condition, obtaining history from patient or surrogate and review of old charts   (including critical care time)  Medications Ordered in ED Medications - No data to display  ED Course  I have reviewed the triage vital signs and the nursing notes.  Pertinent labs & imaging results that were available during my care of the patient were reviewed by me and considered in my medical decision making (see chart for details).  86 year old female with a history of dementia presenting to ED with nausea, vomiting, fevers, very likely aspiration event and she was also hypoxic.  She is normally not on supplemental oxygen.  Her daughter at bedside provided additional history.  She tells me the patient required multiple doses of Ativan the night prior to all this that she was particularly aggressive with her dementia.  She does have a history of acting out and is very difficult to control noncompliant.  However since her episode yesterday evening she has been lethargic.  Patient herself is requiring nonrebreather mask to maintain oxygen saturations.  Will attempt to wean down as able here.  She does not appear to be in impending respiratory failure, I think she is stable with his oxygen mask.  I suspect she aspirated.  I ordered her IV Unasyn for this.  She did receive both Covid  vaccines  Labs are notable for elevated transaminitis and bilirubin.  This raise concern for possible biliary disease.  She had no gallstones noted on CT scan 2018.  Will need a right upper quadrant ultrasound and CT for further evaluation, as the patient provides very poor history and has an unreliable exam, perhaps limited by her dementia and poor mental status.  Her daughter confirms that the patient is DNR/DNI.  She does report that the patient is on hospice, however she would like to pursue hospitalization and potential surgical intervention if needed, and asked  to be updated for any major changes in care.  Clinical Course as of Jun 18 2305  Sun Jun 19, 2019  2307 Lactic Acid, Venous(!!): 5.5 [MT]    Clinical Course User Index [MT] Wyvonnia Dusky, MD   11:30 pm - signed out to EDP provider Dr Marisue Humble with plan to follow up on ultrasound and CT imaging to rule out acute intraabdominal surgical issue, and otherwise admit to medicine for sepsis treatment.  Patient remained stable 98% on nonrebreather at the time of discharge.  Her daughter was at bedside and informed of the plan.  Final Clinical Impression(s) / ED Diagnoses Final diagnoses:  None    Rx / DC Orders ED Discharge Orders    None       Jazyiah Yiu, Carola Rhine, MD 06/20/19 1314

## 2019-06-19 NOTE — ED Notes (Signed)
Date and time results received: 06/19/19 11:06 PM  (use smartphrase ".now" to insert current time)  Test: lactic acid Critical Value: 5.5  Name of Provider Notified: Octaviano Glow, MD  Orders Received? Or Actions Taken?:

## 2019-06-19 NOTE — ED Notes (Signed)
Patient transported to CT 

## 2019-06-20 ENCOUNTER — Inpatient Hospital Stay (HOSPITAL_COMMUNITY): Payer: Medicare PPO

## 2019-06-20 ENCOUNTER — Other Ambulatory Visit: Payer: Self-pay

## 2019-06-20 DIAGNOSIS — E872 Acidosis, unspecified: Secondary | ICD-10-CM | POA: Diagnosis present

## 2019-06-20 DIAGNOSIS — K5901 Slow transit constipation: Secondary | ICD-10-CM | POA: Diagnosis not present

## 2019-06-20 DIAGNOSIS — N390 Urinary tract infection, site not specified: Secondary | ICD-10-CM | POA: Diagnosis present

## 2019-06-20 DIAGNOSIS — F339 Major depressive disorder, recurrent, unspecified: Secondary | ICD-10-CM | POA: Diagnosis present

## 2019-06-20 DIAGNOSIS — F0151 Vascular dementia with behavioral disturbance: Secondary | ICD-10-CM

## 2019-06-20 DIAGNOSIS — G40909 Epilepsy, unspecified, not intractable, without status epilepticus: Secondary | ICD-10-CM

## 2019-06-20 DIAGNOSIS — N202 Calculus of kidney with calculus of ureter: Secondary | ICD-10-CM | POA: Diagnosis present

## 2019-06-20 DIAGNOSIS — J690 Pneumonitis due to inhalation of food and vomit: Secondary | ICD-10-CM | POA: Diagnosis present

## 2019-06-20 DIAGNOSIS — J9601 Acute respiratory failure with hypoxia: Secondary | ICD-10-CM | POA: Diagnosis present

## 2019-06-20 DIAGNOSIS — Z7901 Long term (current) use of anticoagulants: Secondary | ICD-10-CM

## 2019-06-20 DIAGNOSIS — B961 Klebsiella pneumoniae [K. pneumoniae] as the cause of diseases classified elsewhere: Secondary | ICD-10-CM | POA: Diagnosis present

## 2019-06-20 DIAGNOSIS — K810 Acute cholecystitis: Secondary | ICD-10-CM

## 2019-06-20 DIAGNOSIS — I10 Essential (primary) hypertension: Secondary | ICD-10-CM

## 2019-06-20 DIAGNOSIS — I69351 Hemiplegia and hemiparesis following cerebral infarction affecting right dominant side: Secondary | ICD-10-CM | POA: Diagnosis not present

## 2019-06-20 DIAGNOSIS — A4150 Gram-negative sepsis, unspecified: Secondary | ICD-10-CM | POA: Diagnosis not present

## 2019-06-20 DIAGNOSIS — K219 Gastro-esophageal reflux disease without esophagitis: Secondary | ICD-10-CM

## 2019-06-20 DIAGNOSIS — D6489 Other specified anemias: Secondary | ICD-10-CM | POA: Diagnosis present

## 2019-06-20 DIAGNOSIS — R7401 Elevation of levels of liver transaminase levels: Secondary | ICD-10-CM | POA: Diagnosis not present

## 2019-06-20 DIAGNOSIS — A4151 Sepsis due to Escherichia coli [E. coli]: Secondary | ICD-10-CM | POA: Diagnosis present

## 2019-06-20 DIAGNOSIS — R6520 Severe sepsis without septic shock: Secondary | ICD-10-CM | POA: Diagnosis present

## 2019-06-20 DIAGNOSIS — R17 Unspecified jaundice: Secondary | ICD-10-CM | POA: Diagnosis not present

## 2019-06-20 DIAGNOSIS — R159 Full incontinence of feces: Secondary | ICD-10-CM | POA: Diagnosis present

## 2019-06-20 DIAGNOSIS — D6859 Other primary thrombophilia: Secondary | ICD-10-CM | POA: Diagnosis present

## 2019-06-20 DIAGNOSIS — Z7189 Other specified counseling: Secondary | ICD-10-CM

## 2019-06-20 DIAGNOSIS — I5031 Acute diastolic (congestive) heart failure: Secondary | ICD-10-CM | POA: Diagnosis not present

## 2019-06-20 DIAGNOSIS — E876 Hypokalemia: Secondary | ICD-10-CM | POA: Diagnosis present

## 2019-06-20 DIAGNOSIS — Z20822 Contact with and (suspected) exposure to covid-19: Secondary | ICD-10-CM | POA: Diagnosis present

## 2019-06-20 DIAGNOSIS — Z1621 Resistance to vancomycin: Secondary | ICD-10-CM | POA: Diagnosis present

## 2019-06-20 DIAGNOSIS — E869 Volume depletion, unspecified: Secondary | ICD-10-CM | POA: Diagnosis present

## 2019-06-20 DIAGNOSIS — H3530 Unspecified macular degeneration: Secondary | ICD-10-CM | POA: Diagnosis present

## 2019-06-20 DIAGNOSIS — Z515 Encounter for palliative care: Secondary | ICD-10-CM | POA: Diagnosis not present

## 2019-06-20 DIAGNOSIS — N201 Calculus of ureter: Secondary | ICD-10-CM | POA: Diagnosis present

## 2019-06-20 DIAGNOSIS — Z66 Do not resuscitate: Secondary | ICD-10-CM | POA: Diagnosis present

## 2019-06-20 LAB — PROTIME-INR
INR: 1.2 (ref 0.8–1.2)
Prothrombin Time: 14.8 seconds (ref 11.4–15.2)

## 2019-06-20 LAB — CBC WITH DIFFERENTIAL/PLATELET
Abs Immature Granulocytes: 0.11 10*3/uL — ABNORMAL HIGH (ref 0.00–0.07)
Basophils Absolute: 0 10*3/uL (ref 0.0–0.1)
Basophils Relative: 0 %
Eosinophils Absolute: 0 10*3/uL (ref 0.0–0.5)
Eosinophils Relative: 0 %
HCT: 42.3 % (ref 36.0–46.0)
Hemoglobin: 12.9 g/dL (ref 12.0–15.0)
Immature Granulocytes: 1 %
Lymphocytes Relative: 5 %
Lymphs Abs: 0.8 10*3/uL (ref 0.7–4.0)
MCH: 30.7 pg (ref 26.0–34.0)
MCHC: 30.5 g/dL (ref 30.0–36.0)
MCV: 100.7 fL — ABNORMAL HIGH (ref 80.0–100.0)
Monocytes Absolute: 0.6 10*3/uL (ref 0.1–1.0)
Monocytes Relative: 4 %
Neutro Abs: 12.9 10*3/uL — ABNORMAL HIGH (ref 1.7–7.7)
Neutrophils Relative %: 90 %
Platelets: 210 10*3/uL (ref 150–400)
RBC: 4.2 MIL/uL (ref 3.87–5.11)
RDW: 15.5 % (ref 11.5–15.5)
WBC: 14.4 10*3/uL — ABNORMAL HIGH (ref 4.0–10.5)
nRBC: 0 % (ref 0.0–0.2)

## 2019-06-20 LAB — HEPATITIS PANEL, ACUTE
HCV Ab: NONREACTIVE
Hep A IgM: NONREACTIVE
Hep B C IgM: NONREACTIVE
Hepatitis B Surface Ag: NONREACTIVE

## 2019-06-20 LAB — URINALYSIS, ROUTINE W REFLEX MICROSCOPIC
Bilirubin Urine: NEGATIVE
Glucose, UA: NEGATIVE mg/dL
Ketones, ur: NEGATIVE mg/dL
Nitrite: POSITIVE — AB
Protein, ur: NEGATIVE mg/dL
Specific Gravity, Urine: 1.012 (ref 1.005–1.030)
pH: 5 (ref 5.0–8.0)

## 2019-06-20 LAB — ECHOCARDIOGRAM COMPLETE
Height: 63 in
Weight: 2469.15 oz

## 2019-06-20 LAB — COMPREHENSIVE METABOLIC PANEL
ALT: 175 U/L — ABNORMAL HIGH (ref 0–44)
AST: 234 U/L — ABNORMAL HIGH (ref 15–41)
Albumin: 2.8 g/dL — ABNORMAL LOW (ref 3.5–5.0)
Alkaline Phosphatase: 110 U/L (ref 38–126)
Anion gap: 11 (ref 5–15)
BUN: 10 mg/dL (ref 8–23)
CO2: 18 mmol/L — ABNORMAL LOW (ref 22–32)
Calcium: 7 mg/dL — ABNORMAL LOW (ref 8.9–10.3)
Chloride: 114 mmol/L — ABNORMAL HIGH (ref 98–111)
Creatinine, Ser: 1.06 mg/dL — ABNORMAL HIGH (ref 0.44–1.00)
GFR calc Af Amer: 54 mL/min — ABNORMAL LOW (ref >60)
GFR calc non Af Amer: 47 mL/min — ABNORMAL LOW (ref >60)
Glucose, Bld: 158 mg/dL — ABNORMAL HIGH (ref 70–99)
Potassium: 4 mmol/L (ref 3.5–5.1)
Sodium: 143 mmol/L (ref 135–145)
Total Bilirubin: 2.3 mg/dL — ABNORMAL HIGH (ref 0.3–1.2)
Total Protein: 5.2 g/dL — ABNORMAL LOW (ref 6.5–8.1)

## 2019-06-20 LAB — AMMONIA: Ammonia: 32 umol/L (ref 9–35)

## 2019-06-20 LAB — HEPATIC FUNCTION PANEL
ALT: 152 U/L — ABNORMAL HIGH (ref 0–44)
AST: 234 U/L — ABNORMAL HIGH (ref 15–41)
Albumin: 3.4 g/dL — ABNORMAL LOW (ref 3.5–5.0)
Alkaline Phosphatase: 144 U/L — ABNORMAL HIGH (ref 38–126)
Bilirubin, Direct: 1.3 mg/dL — ABNORMAL HIGH (ref 0.0–0.2)
Indirect Bilirubin: 0.8 mg/dL (ref 0.3–0.9)
Total Bilirubin: 2.1 mg/dL — ABNORMAL HIGH (ref 0.3–1.2)
Total Protein: 6.3 g/dL — ABNORMAL LOW (ref 6.5–8.1)

## 2019-06-20 LAB — HEPARIN LEVEL (UNFRACTIONATED): Heparin Unfractionated: 0.13 IU/mL — ABNORMAL LOW (ref 0.30–0.70)

## 2019-06-20 LAB — SARS CORONAVIRUS 2 BY RT PCR (HOSPITAL ORDER, PERFORMED IN ~~LOC~~ HOSPITAL LAB): SARS Coronavirus 2: NEGATIVE

## 2019-06-20 LAB — APTT: aPTT: 28 seconds (ref 24–36)

## 2019-06-20 LAB — PROCALCITONIN: Procalcitonin: 100.01 ng/mL

## 2019-06-20 LAB — LACTIC ACID, PLASMA
Lactic Acid, Venous: 5.3 mmol/L (ref 0.5–1.9)
Lactic Acid, Venous: 4.8 mmol/L (ref 0.5–1.9)

## 2019-06-20 LAB — MRSA PCR SCREENING: MRSA by PCR: NEGATIVE

## 2019-06-20 LAB — GLUCOSE, CAPILLARY
Glucose-Capillary: 88 mg/dL (ref 70–99)
Glucose-Capillary: 101 mg/dL — ABNORMAL HIGH (ref 70–99)

## 2019-06-20 LAB — CORTISOL-AM, BLOOD: Cortisol - AM: 42.3 ug/dL — ABNORMAL HIGH (ref 6.7–22.6)

## 2019-06-20 MED ORDER — PANTOPRAZOLE SODIUM 40 MG PO TBEC
40.0000 mg | DELAYED_RELEASE_TABLET | Freq: Every day | ORAL | Status: DC
  Administered 2019-06-21 – 2019-06-22 (×2): 40 mg via ORAL
  Filled 2019-06-20 (×3): qty 1

## 2019-06-20 MED ORDER — LORAZEPAM 0.5 MG PO TABS: 0.5000 mg | ORAL_TABLET | Freq: Two times a day (BID) | ORAL | Status: DC | PRN

## 2019-06-20 MED ORDER — SODIUM CHLORIDE 0.9 % IV SOLN
250.0000 mg | Freq: Two times a day (BID) | INTRAVENOUS | Status: DC
  Administered 2019-06-20 – 2019-06-24 (×9): 250 mg via INTRAVENOUS
  Filled 2019-06-20 (×10): qty 2.5

## 2019-06-20 MED ORDER — SIMETHICONE 80 MG PO CHEW
80.0000 mg | CHEWABLE_TABLET | Freq: Three times a day (TID) | ORAL | Status: DC
  Administered 2019-06-21 – 2019-06-22 (×3): 80 mg via ORAL
  Filled 2019-06-20 (×6): qty 1

## 2019-06-20 MED ORDER — ONDANSETRON HCL 4 MG PO TABS: 4.0000 mg | ORAL_TABLET | Freq: Four times a day (QID) | ORAL | Status: DC | PRN

## 2019-06-20 MED ORDER — TECHNETIUM TC 99M MEBROFENIN IV KIT
5.4000 | PACK | Freq: Once | INTRAVENOUS | Status: AC | PRN
  Administered 2019-06-20: 5.4 via INTRAVENOUS

## 2019-06-20 MED ORDER — POTASSIUM CHLORIDE 10 MEQ/100ML IV SOLN
10.0000 meq | INTRAVENOUS | Status: AC
  Administered 2019-06-20 (×4): 10 meq via INTRAVENOUS
  Filled 2019-06-20 (×4): qty 100

## 2019-06-20 MED ORDER — DEXTROSE-NACL 5-0.45 % IV SOLN: INTRAVENOUS | Status: DC

## 2019-06-20 MED ORDER — POTASSIUM CHLORIDE 2 MEQ/ML IV SOLN
INTRAVENOUS | Status: DC
  Filled 2019-06-20 (×3): qty 1000

## 2019-06-20 MED ORDER — SERTRALINE HCL 25 MG PO TABS: 25.0000 mg | ORAL_TABLET | Freq: Every day | ORAL | Status: DC

## 2019-06-20 MED ORDER — ACETAMINOPHEN 325 MG PO TABS
650.0000 mg | ORAL_TABLET | Freq: Four times a day (QID) | ORAL | Status: DC | PRN
  Administered 2019-06-22: 650 mg via ORAL
  Filled 2019-06-20: qty 2

## 2019-06-20 MED ORDER — SODIUM CHLORIDE 0.9 % IV BOLUS: 1000.0000 mL | Freq: Once | INTRAVENOUS | Status: DC

## 2019-06-20 MED ORDER — ACETAMINOPHEN 650 MG RE SUPP: 650.0000 mg | Freq: Four times a day (QID) | RECTAL | Status: DC | PRN

## 2019-06-20 MED ORDER — SENNOSIDES-DOCUSATE SODIUM 8.6-50 MG PO TABS: 2.0000 | ORAL_TABLET | Freq: Every evening | ORAL | Status: DC | PRN

## 2019-06-20 MED ORDER — POLYETHYLENE GLYCOL 3350 17 G PO PACK: 17.0000 g | PACK | Freq: Every day | ORAL | Status: DC | PRN

## 2019-06-20 MED ORDER — ONDANSETRON HCL 4 MG/2ML IJ SOLN: 4.0000 mg | Freq: Four times a day (QID) | INTRAMUSCULAR | Status: DC | PRN

## 2019-06-20 MED ORDER — SODIUM CHLORIDE 0.9 % IV BOLUS
1000.0000 mL | Freq: Once | INTRAVENOUS | Status: AC
  Administered 2019-06-20: 1000 mL via INTRAVENOUS

## 2019-06-20 MED ORDER — SERTRALINE HCL 25 MG PO TABS
125.0000 mg | ORAL_TABLET | Freq: Every day | ORAL | Status: DC
  Administered 2019-06-21: 125 mg via ORAL
  Filled 2019-06-20 (×3): qty 1

## 2019-06-20 MED ORDER — LEVETIRACETAM 100 MG/ML PO SOLN: 250.0000 mg | Freq: Two times a day (BID) | ORAL | Status: DC

## 2019-06-20 MED ORDER — LORATADINE 10 MG PO TABS
10.0000 mg | ORAL_TABLET | Freq: Every day | ORAL | Status: DC
  Administered 2019-06-21 – 2019-06-22 (×2): 10 mg via ORAL
  Filled 2019-06-20 (×3): qty 1

## 2019-06-20 MED ORDER — RISPERIDONE 0.25 MG PO TABS
0.2500 mg | ORAL_TABLET | Freq: Every evening | ORAL | Status: DC
  Administered 2019-06-21: 0.25 mg via ORAL
  Filled 2019-06-20 (×3): qty 1

## 2019-06-20 MED ORDER — SODIUM CHLORIDE 0.9 % IV BOLUS
2000.0000 mL | Freq: Once | INTRAVENOUS | Status: AC
  Administered 2019-06-20: 2000 mL via INTRAVENOUS

## 2019-06-20 MED ORDER — SERTRALINE HCL 100 MG PO TABS: 100.0000 mg | ORAL_TABLET | Freq: Every day | ORAL | Status: DC

## 2019-06-20 MED ORDER — METOPROLOL TARTRATE 25 MG PO TABS
12.5000 mg | ORAL_TABLET | Freq: Two times a day (BID) | ORAL | Status: DC
  Administered 2019-06-21 – 2019-06-22 (×4): 12.5 mg via ORAL
  Filled 2019-06-20 (×6): qty 1

## 2019-06-20 MED ORDER — MIRTAZAPINE 15 MG PO TABS
7.5000 mg | ORAL_TABLET | Freq: Every day | ORAL | Status: DC
  Administered 2019-06-21: 7.5 mg via ORAL
  Filled 2019-06-20 (×3): qty 1

## 2019-06-20 MED ORDER — IPRATROPIUM-ALBUTEROL 0.5-2.5 (3) MG/3ML IN SOLN
3.0000 mL | RESPIRATORY_TRACT | Status: DC | PRN
  Administered 2019-06-22: 3 mL via RESPIRATORY_TRACT
  Filled 2019-06-20: qty 3

## 2019-06-20 MED ORDER — PIPERACILLIN-TAZOBACTAM 3.375 G IVPB
3.3750 g | Freq: Three times a day (TID) | INTRAVENOUS | Status: DC
  Administered 2019-06-20 – 2019-06-22 (×7): 3.375 g via INTRAVENOUS
  Filled 2019-06-20 (×7): qty 50

## 2019-06-20 MED ORDER — LACTATED RINGERS IV SOLN: INTRAVENOUS | Status: DC

## 2019-06-20 NOTE — ED Notes (Signed)
Pt readjusted in the bed, resting comfortably at this time.

## 2019-06-20 NOTE — ED Notes (Signed)
[facility] called and reported that the last medicine patient took yesterday around 8:55 am was...  Claritin 10 mg  Omeprazole 10 mg Mira lax 17 mg

## 2019-06-20 NOTE — Progress Notes (Signed)
PROGRESS NOTE    Erica [name]  QHU:[id number] DOB: 10/03/1927 DOA: 06/19/2019 PCP: Mast, Man X, NP   Brief Narrative:   -year-old with history of vascular dementia, previous DVT on Xarelto, hemorrhagic CVA with residual right-sided weakness, HTN, seizure disorder, GERD sent from skilled nursing facility for evaluation of lethargy and vomiting.  Initially noted to be febrile tachycardic with elevated lactic acidosis.  Transaminitis but ultrasound was negative for acute cholecystitis.  Initial CMP showed acute cholecystitis with bibasilar pneumonia.  Started on Zosyn   Assessment & Plan:   Principal Problem:   Severe sepsis with acute organ dysfunction due to Gram negative bacteria (HCC) Active Problems:   Essential hypertension   GERD without esophagitis   Vascular dementia with behavior disturbance (HCC)   Chronic anticoagulation   Goals of care, counseling/discussion   Seizure disorder ([place])   Acute cholecystitis   Left ureteral stone   Lactic acidosis   Gram-negative sepsis with organ dysfunction (HCC)   Aspiration pneumonia of both lower lobes due to gastric secretions (HCC)  Sepsis, unknown exact etiology intra-abdominal versus aspiration pneumonia -CT showed acute cholecystitis but right upper quadrant ultrasound is not consistent with this. -HIDA scan -General surgery-recommend medical management and if necessary IR guided percutaneous drain -Empiric antibiotic-Zosyn -Xarelto currently on hold MEWS elevated = advised RN staff to update her vital signs.  Last temperature from over 12 hours ago.  Aspiration pneumonia versus pneumonitis -Bronchodilators as needed.  Supplemental oxygen -Continue IV Zosyn -Speech and swallow evaluation  GERD without esophagitis -PPI  Left-sided nonobstructive renal stone -Hydration, supportive care. -UA negative for UTI  Essential hypertension -Metoprolol twice daily  Vascular dementia with behavioral disturbances -On Risperdal,  Zoloft  Chronic anticoagulation due to history of DVT with protein C & S deficiency -Xarelto currently on hold.  Seizure disorder -On Keppra   DVT prophylaxis: Xarelto on hold Code Status: DNR Family Communication: Spoke with daughter at bedside  Status is: Inpatient  Remains inpatient appropriate because:Hemodynamically unstable   Dispo: The patient is from: SNF              Anticipated d/c is to: SNF              Anticipated d/c date is: 3 days              Patient currently is not medically stable to d/c.  Patient is still febrile, tachycardic and mildly tachypneic.  Requiring IV antibiotics.  Procalcitonin severely elevated.  Maintain hospital stay    Subjective: Confused at bedside.  Spoke with daughter. Apparently for the past 2 days patient has been increasingly confused and combative.  Poor oral intake.  Overall history is limited  Review of Systems Otherwise negative except as per HPI, including: Difficult to obtain due to mentation.   Examination:  Constitutional: Not in acute distress, pleasantly confused Respiratory: Bilateral rhonchi Cardiovascular: Sinus tachycardia normal sinus rhythm, no rubs Abdomen: Nontender nondistended good bowel sounds Musculoskeletal: No edema noted Skin: No rashes seen Neurologic: Difficult to assess but nonfocal Psychiatric: Difficult to assess  Objective: Vitals:   06/20/19 0531 06/20/19 0545 06/20/19 0600 06/20/19 0615  BP: (!) 116/96 116/61 (!) 127/59 127/61  Pulse: (!) 110 (!) 102 (!) 102 (!) 104  Resp: (!) 25 (!) 25 (!) 24 (!) 22  Temp:      TempSrc:      SpO2: 93% 94% 95% 94%  Weight:      Height:        Intake/Output Summary (  Last 24 hours) at 06/20/2019 0845 Last data filed at 06/20/2019 0253 Gross per 24 hour  Intake 100 ml  Output 600 ml  Net -500 ml   Filed Weights   06/19/19 2158  Weight: 70 kg     Data Reviewed:   CBC: Recent Labs  Lab 06/19/19 2200 06/20/19 0555 06/20/19 0658  WBC 7.7  SPECIMEN CLOTTED 14.4*  NEUTROABS 7.0 SPECIMEN CLOTTED 12.9*  HGB 15.2* SPECIMEN CLOTTED 12.9  HCT 48.4* SPECIMEN CLOTTED 42.3  MCV 96.2 SPECIMEN CLOTTED 100.7*  PLT 222 SPECIMEN CLOTTED 361   Basic Metabolic Panel: Recent Labs  Lab 06/19/19 2200 06/20/19 0555  NA 147* 143  K 3.1* 4.0  CL 111 114*  CO2 20* 18*  GLUCOSE 159* 158*  BUN 14 10  CREATININE 1.28* 1.06*  CALCIUM 8.4* 7.0*   GFR: Estimated Creatinine Clearance: 33.7 mL/min (A) (by C-G formula based on SCr of 1.06 mg/dL (H)). Liver Function Tests: Recent Labs  Lab 06/19/19 2200 06/19/19 2208 06/20/19 0555  AST 231* 234* 234*  ALT 157* 152* 175*  ALKPHOS 152* 144* 110  BILITOT 2.3* 2.1* 2.3*  PROT 6.4* 6.3* 5.2*  ALBUMIN 3.6 3.4* 2.8*   No results for input(s): LIPASE, AMYLASE in the last 168 hours. No results for input(s): AMMONIA in the last 168 hours. Coagulation Profile: Recent Labs  Lab 06/19/19 2200 06/20/19 0555 06/20/19 0658  INR 1.3* SPECIMEN CLOTTED 1.2   Cardiac Enzymes: No results for input(s): CKTOTAL, CKMB, CKMBINDEX, TROPONINI in the last 168 hours. BNP (last 3 results) No results for input(s): PROBNP in the last 8760 hours. HbA1C: No results for input(s): HGBA1C in the last 72 hours. CBG: No results for input(s): GLUCAP in the last 168 hours. Lipid Profile: No results for input(s): CHOL, HDL, LDLCALC, TRIG, CHOLHDL, LDLDIRECT in the last 72 hours. Thyroid Function Tests: No results for input(s): TSH, T4TOTAL, FREET4, T3FREE, THYROIDAB in the last 72 hours. Anemia Panel: No results for input(s): VITAMINB12, FOLATE, FERRITIN, TIBC, IRON, RETICCTPCT in the last 72 hours. Sepsis Labs: Recent Labs  Lab 06/19/19 2200 06/20/19 0000 06/20/19 0555  PROCALCITON  --   --  100.01  LATICACIDVEN 5.5* 5.3* 4.8*    Recent Results (from the past 240 hour(s))  SARS Coronavirus 2 by RT PCR (hospital order, performed in [facility] hospital lab) Nasopharyngeal Nasopharyngeal Swab     Status:  None   Collection Time: 06/19/19 10:30 PM   Specimen: Nasopharyngeal Swab  Result Value Ref Range Status   SARS Coronavirus 2 NEGATIVE NEGATIVE Final    Comment: (NOTE) SARS-CoV-2 target nucleic acids are NOT DETECTED.  The SARS-CoV-2 RNA is generally detectable in upper and lower respiratory specimens during the acute phase of infection. The lowest concentration of SARS-CoV-2 viral copies this assay can detect is 250 copies / mL. A negative result does not preclude SARS-CoV-2 infection and should not be used as the sole basis for treatment or other patient management decisions.  A negative result may occur with improper specimen collection / handling, submission of specimen other than nasopharyngeal swab, presence of viral mutation(s) within the areas targeted by this assay, and inadequate number of viral copies (<250 copies / mL). A negative result must be combined with clinical observations, patient history, and epidemiological information.  Fact Sheet for Patients:   StrictlyIdeas.no  Fact Sheet for Healthcare Providers: BankingDealers.co.za  This test is not yet approved or  cleared by the [place] FDA and has been authorized for detection and/or diagnosis of SARS-CoV-2  by FDA under an Emergency Use Authorization (EUA).  This EUA will remain in effect (meaning this test can be used) for the duration of the COVID-19 declaration under Section 564(b)(1) of the Act, 21 U.S.C. section 360bbb-3(b)(1), unless the authorization is terminated or revoked sooner.  Performed at [facility], [place] [address] [place], [place] [address]          Radiology Studies: CT ABDOMEN PELVIS W CONTRAST  Result Date: 06/20/2019 CLINICAL DATA:  Biliary colic. EXAM: CT ABDOMEN AND PELVIS WITH CONTRAST TECHNIQUE: Multidetector CT imaging of the abdomen and pelvis was performed using the standard protocol following bolus  administration of intravenous contrast. CONTRAST:  64mL OMNIPAQUE IOHEXOL 300 MG/ML  SOLN COMPARISON:  July 13, 2016 FINDINGS: Lower chest: Significant bibasilar airspace disease is noted favored to represent a combination of both atelectasis and consolidation, especially at the right lung base.The heart is enlarged. Hepatobiliary: There is decreased hepatic attenuation suggestive of hepatic steatosis. There appears to be some gallbladder wall thickening and adjacent inflammatory changes. There appears to be pericholecystic free fluid.There is no biliary ductal dilation. Pancreas: Normal contours without ductal dilatation. No peripancreatic fluid collection. Spleen: Unremarkable. Adrenals/Urinary Tract: --Adrenal glands: Unremarkable. --Right kidney/ureter: No hydronephrosis or radiopaque kidney stones. --Left kidney/ureter: There is an at least partially duplicated left-sided collecting system. There is a nonobstructing stone measuring approximately 6 mm in the distal left ureter, nearly at the left UVJ (axial series 2, image 73). --Urinary bladder: Unremarkable. Stomach/Bowel: --Stomach/Duodenum: No hiatal hernia or other gastric abnormality. Normal duodenal course and caliber. --Small bowel: Unremarkable. --Colon: There is a very large amount of stool the level the rectum. There is scattered colonic diverticula without CT evidence for diverticulitis. --Appendix: Not visualized. No right lower quadrant inflammation or free fluid. Vascular/Lymphatic: Atherosclerotic calcification is present within the non-aneurysmal abdominal aorta, without hemodynamically significant stenosis. --No retroperitoneal lymphadenopathy. --No mesenteric lymphadenopathy. --No pelvic or inguinal lymphadenopathy. Reproductive: Status post hysterectomy. No adnexal mass. Other: No ascites or free air. The abdominal wall is normal. Musculoskeletal. Again noted is a compression fracture of the T9 vertebral body. There has been significant  interval height loss since the patient's prior study dated 07/25/2018. There is no new acute appearing compression fracture. IMPRESSION: 1. Findings are concerning for acute cholecystitis. As this is discordant with the patient's recent ultrasound, correlation with laboratory studies and HIDA scan is recommended. 2. There is a large amount of stool the level of the rectum. 3. There is a nonobstructing stone measuring approximately 6 mm in the distal left ureter, nearly at the left UVJ. 4. Significant bibasilar airspace disease favored to represent a combination of both atelectasis and consolidation, especially at the right lung base. 5. Hepatic steatosis. 6. Cardiomegaly. 7. Again noted is a T9 compression fracture with significant interval height loss since 2020. Aortic Atherosclerosis (ICD10-I70.0). Electronically Signed   By: Constance Holster M.D.   On: 06/20/2019 00:26   DG Chest Port 1 View  Result Date: 06/19/2019 CLINICAL DATA:  Shortness of breath and unresponsiveness EXAM: PORTABLE CHEST 1 VIEW COMPARISON:  07/25/2018 FINDINGS: Cardiac shadow is enlarged but stable. Aortic calcifications are again seen. Overall inspiratory effort is poor however no focal infiltrate is seen. Mild central vascular prominence is noted. No bony abnormality is noted. IMPRESSION: Mild central vascular congestion.  No focal infiltrate is noted. Electronically Signed   By: Inez Catalina M.D.   On: 06/19/2019 22:22   US Abdomen Limited RUQ  Result Date: 06/19/2019 CLINICAL DATA:  Transaminitis  EXAM: ULTRASOUND ABDOMEN LIMITED RIGHT UPPER QUADRANT COMPARISON:  None. FINDINGS: Gallbladder: No gallstones or wall thickening visualized. No sonographic Murphy sign noted by sonographer. Common bile duct: Diameter: Normal caliber, 6 mm. Liver: Increased echotexture compatible with fatty infiltration. No focal abnormality or biliary ductal dilatation. Portal vein is patent on color Doppler imaging with normal direction of blood flow  towards the liver. Other: None. IMPRESSION: Fatty infiltration of the liver. No acute findings. Electronically Signed   By: Rolm Baptise M.D.   On: 06/19/2019 23:29        Scheduled Meds: . levETIRAcetam  250 mg Oral BID  . loratadine  10 mg Oral Daily  . metoprolol tartrate  12.5 mg Oral BID  . mirtazapine  7.5 mg Oral QHS  . pantoprazole  40 mg Oral Daily  . risperiDONE  0.25 mg Oral QPM  . sertraline  100 mg Oral QHS  . sertraline  25 mg Oral QHS  . simethicone  80 mg Oral TID WC   Continuous Infusions: . lactated ringers with kcl 100 mL/hr at 06/20/19 0430  . piperacillin-tazobactam (ZOSYN)  IV 3.375 g (06/20/19 0702)     LOS: 0 days   Time spent= 35 mins    Erica [name] Arsenio Loader, MD [facility]  If 7PM-7AM, please contact night-coverage  06/20/2019, 8:45 AM

## 2019-06-20 NOTE — Evaluation (Signed)
SLP Cancellation Note  Patient Details Name: Erica [name] MRN: [id number] DOB: 1927/05/28   Cancelled treatment:       Reason Eval/Treat Not Completed: Other (comment);Medical issues which prohibited therapy (pt npo for testing and potential procedure, will continue efforts)   [name] 06/20/2019, 10:30 AM  Kathleen Lime, MS [place] Office [phone]

## 2019-06-20 NOTE — H&P (Addendum)
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  Patient is extremely poor historian due to known history of advanced dementia and therefore the majority of history is been obtained from the daughter via telephone conversation.  Patient's daughter explains that for the past 2 days at her skilled nursing facility she has been exhibiting bouts of lethargy, confusion and agitation.  This is been associated with poor oral intake.  Patient's daughter denies any associated cough, shortness of breath, diarrhea or complaints of pain.  Earlier in the day on 6/13, the patient then began to exhibit bouts of vomiting.  This vomiting was nonbilious and nonbloody.  Patient was then brought into [facility] emergency department for evaluation.  Upon evaluation in the emergency department patient was found to be febrile with a temperature of 103 F, tachypneic, tachycardic and possessing a substantial lactic acidosis of 5.5.  Due to multiple derangements with patient's hepatic enzymes, right upper quadrant ultrasound was performed which revealed no evidence of acute cholecystitis.  However, CT imaging of the abdomen pelvis did reveal findings concerning for acute cholecystitis with evidence of possible current bibasilar pneumonia, particularly in the right lower lobe.  Patient was initiated on intravenous Zosyn and provided with 3 L of normal saline boluses.  Blood cultures were obtained.  The hospitalist group was  then called to assess the patient for mission the hospital.   Review of Systems: Unable to obtain due to patient's substantial lethargy and confusion.  Past Medical History:  Diagnosis Date  . Acute encephalopathy   . Aphasia following unspecified cerebrovascular disease   . 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 Depression   . Dysphagia following cerebrovascular disease   . Hepatic steatosis   . Hydroureteronephrosis   . Hypertension   . Insomnia   . Macular degeneration   . Major depressive disorder, recurrent ([place])   . Neuralgia and neuritis, unspecified ([place])   . Nontraumatic intracranial hemorrhage ([place])   . Stroke ([place])   . 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   . 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 ABDOMINAL HYSTERECTOMY    . APPENDECTOMY    . BREAST SURGERY    . MASTECTOMY Right      reports that she has never smoked. She has never used smokeless tobacco. She reports that she does not drink alcohol and does not use drugs.  Allergies  Allergen Reactions  . Hydrocodone Nausea And Vomiting  . Morphine And Related Nausea And Vomiting  . Actonel [Risedronate Sodium] Other (See Comments)    unknown  . Darvon [Propoxyphene Hcl] Other (See Comments)    unknown  . Oxycodone Hcl Other (See Comments)    unknown  . Pneumovax [Pneumococcal Polysaccharide Vaccine] Other (See Comments)    unknown    History reviewed. No pertinent family history.   Prior to Admission medications   Medication Sig Start Date End Date Taking? Authorizing  Provider  acetaminophen (TYLENOL) 325 MG tablet Take 650 mg by mouth every 4 (four) hours as needed.    [provider]  bisacodyl (DULCOLAX) 10 MG suppository Place 10 mg rectally daily as needed for mild constipation or moderate constipation.    [provider]  hydrocortisone 2.5 % lotion  Apply topically 2 (two) times daily as needed. Apply to itchy  area on face.    [provider]  levETIRAcetam (KEPPRA) 100 MG/ML solution Take 250 mg by mouth. 250 mg = 2.5 ML; oral Twice A Day  Morning: 7am-10am Evening: 7pm-10pm    [provider]  loratadine (CLARITIN) 10 MG tablet Take 10 mg by mouth daily. 7am-10am.    [provider]  LORazepam (ATIVAN) 0.5 MG tablet Take one tablet by mouth twice daily as needed 05/23/19   Mast, Man X, NP  metoprolol tartrate (LOPRESSOR) 25 MG tablet Take 12.5 mg by mouth 2 (two) times daily. Morning: 7am-10am Evening: 7pm-10pm.    [provider]  mirtazapine (REMERON) 15 MG tablet Take 7.5 mg by mouth at bedtime. 7pm-10pm.    [provider]  Nutritional Supplements (NUTRITIONAL SUPPLEMENT PO) Take 1 each by mouth in the morning and at bedtime. Magic Cup    [provider]  nystatin (NYSTATIN) powder Apply 1 application topically daily as needed. 100,000unit Under left breast    [provider]  nystatin cream (MYCOSTATIN) Apply 1 application topically See admin instructions. Mix with triamcinolone cream and apply to reddened area on back and buttocks with each continent episode and prn, then cover with zinc oxide barrier cream.    [provider]  omeprazole (PRILOSEC) 10 MG capsule Take 10 mg by mouth daily. 7am-10am.    [provider]  polyethylene glycol (MIRALAX / GLYCOLAX) packet Take 17 g by mouth daily. 7am-10am.    [provider]  risperiDONE (RISPERDAL) 0.25 MG tablet Take 0.25 mg by mouth every evening. 7pm-10pm    [provider]  Rivaroxaban (XARELTO) 15 MG TABS tablet Take 15 mg by mouth every evening. 4pm-6pm.    [provider]  sertraline (ZOLOFT) 100 MG tablet Take 100 mg by mouth at bedtime. 7pm-10pm. Take with 25 mg tablet 08/19/18   [provider]  sertraline (ZOLOFT) 25 MG tablet Take 25 mg by mouth at bedtime.  7pm-10pm. Give 25mg  along with 100mg  to =125mg . 08/19/18   [provider]  simethicone (MYLICON) 80 MG chewable tablet Chew 80 mg by mouth 3 (three) times daily with meals.    [provider]  triamcinolone cream (KENALOG) 0.5 % Apply 1 application topically See admin instructions. TID and PRN. Mix with Nystatin cream and apply to reddened areas on back and buttocks with each incontinent episode and prn, then cover with zinc oxide barrier cream    [provider]  triamcinolone ointment (KENALOG) 0.1 %  06/04/19   [provider]    Physical Exam: Vitals:   06/20/19 0030 06/20/19 0032 06/20/19 0045 06/20/19 0115  BP: 116/69  (!) 115/58 (!) 110/53  Pulse: 98  99 95  Resp: (!) 24  (!) 26 (!) 24  Temp:  98.8 F (37.1 C)    TempSrc:  Oral    SpO2: 97%  94% 93%  Weight:      Height:        Constitutional: Lethargic but arousable, oriented x1, in mild distress presumably due to pain. Skin: no rashes, no lesions, notably poor skin turgor. Eyes: Pupils  are equally reactive to light.  No evidence of scleral icterus or conjunctival pallor.  ENMT: Extremely dry mucous membranes noted.  Posterior pharynx clear of any exudate or lesions.   Neck: normal, supple, no masses, no thyromegaly.  No evidence of jugular venous distension.   Respiratory: Notable bibasilar rales without any evidence of wheezing.  Patient is tachypneic. No accessory muscle use.  Cardiovascular: Regular rate and rhythm, no murmurs / rubs / gallops. No extremity edema. 2+ pedal pulses. No carotid bruits.  Chest:   Nontender without crepitus or deformity.   Back:   Nontender without crepitus or deformity. Abdomen: Notable diffuse tenderness.  Abdomen is soft however.  No evidence of intra-abdominal masses.  Positive bowel sounds noted in all quadrants.   Musculoskeletal: No joint deformity upper and lower extremities. Good ROM, no contractures. Normal muscle tone.  Neurologic: Patient does not  consistently follow commands.  Patient is only oriented x1.  Sensation is grossly intact.  Patient does localize to pain.  Patient does respond to verbal stimuli.   Psychiatric: Unable to fully assess due to substantial lethargy.  Patient currently does not seem to possess insight as to her current situation.   Labs on Admission: I have personally reviewed following labs and imaging studies -   CBC: Recent Labs  Lab 06/19/19 2200  WBC 7.7  NEUTROABS 7.0  HGB 15.2*  HCT 48.4*  MCV 96.2  PLT 932   Basic Metabolic Panel: Recent Labs  Lab 06/19/19 2200  NA 147*  K 3.1*  CL 111  CO2 20*  GLUCOSE 159*  BUN 14  CREATININE 1.28*  CALCIUM 8.4*   GFR: Estimated Creatinine Clearance: 27.9 mL/min (A) (by C-G formula based on SCr of 1.28 mg/dL (H)). Liver Function Tests: Recent Labs  Lab 06/19/19 2200 06/19/19 2208  AST 231* 234*  ALT 157* 152*  ALKPHOS 152* 144*  BILITOT 2.3* 2.1*  PROT 6.4* 6.3*  ALBUMIN 3.6 3.4*   No results for input(s): LIPASE, AMYLASE in the last 168 hours. No results for input(s): AMMONIA in the last 168 hours. Coagulation Profile: Recent Labs  Lab 06/19/19 2200  INR 1.3*   Cardiac Enzymes: No results for input(s): CKTOTAL, CKMB, CKMBINDEX, TROPONINI in the last 168 hours. BNP (last 3 results) No results for input(s): PROBNP in the last 8760 hours. HbA1C: No results for input(s): HGBA1C in the last 72 hours. CBG: No results for input(s): GLUCAP in the last 168 hours. Lipid Profile: No results for input(s): CHOL, HDL, LDLCALC, TRIG, CHOLHDL, LDLDIRECT in the last 72 hours. Thyroid Function Tests: No results for input(s): TSH, T4TOTAL, FREET4, T3FREE, THYROIDAB in the last 72 hours. Anemia Panel: No results for input(s): VITAMINB12, FOLATE, FERRITIN, TIBC, IRON, RETICCTPCT in the last 72 hours. Urine analysis:    Component Value Date/Time   COLORURINE YELLOW 06/19/2019 2200   APPEARANCEUR CLEAR 06/19/2019 2200   LABSPEC 1.012 06/19/2019  2200   PHURINE 5.0 06/19/2019 2200   GLUCOSEU NEGATIVE 06/19/2019 2200   HGBUR MODERATE (A) 06/19/2019 2200   BILIRUBINUR NEGATIVE 06/19/2019 2200   KETONESUR NEGATIVE 06/19/2019 2200   PROTEINUR NEGATIVE 06/19/2019 2200   NITRITE POSITIVE (A) 06/19/2019 2200   LEUKOCYTESUR TRACE (A) 06/19/2019 2200    Radiological Exams on Admission - Personally Reviewed: CT ABDOMEN PELVIS W CONTRAST  Result Date: 06/20/2019 CLINICAL DATA:  Biliary colic. EXAM: CT ABDOMEN AND PELVIS WITH CONTRAST TECHNIQUE: Multidetector CT imaging of the abdomen and pelvis was performed using the standard protocol following bolus administration of  intravenous contrast. CONTRAST:  13mL OMNIPAQUE IOHEXOL 300 MG/ML  SOLN COMPARISON:  July 13, 2016 FINDINGS: Lower chest: Significant bibasilar airspace disease is noted favored to represent a combination of both atelectasis and consolidation, especially at the right lung base.The heart is enlarged. Hepatobiliary: There is decreased hepatic attenuation suggestive of hepatic steatosis. There appears to be some gallbladder wall thickening and adjacent inflammatory changes. There appears to be pericholecystic free fluid.There is no biliary ductal dilation. Pancreas: Normal contours without ductal dilatation. No peripancreatic fluid collection. Spleen: Unremarkable. Adrenals/Urinary Tract: --Adrenal glands: Unremarkable. --Right kidney/ureter: No hydronephrosis or radiopaque kidney stones. --Left kidney/ureter: There is an at least partially duplicated left-sided collecting system. There is a nonobstructing stone measuring approximately 6 mm in the distal left ureter, nearly at the left UVJ (axial series 2, image 73). --Urinary bladder: Unremarkable. Stomach/Bowel: --Stomach/Duodenum: No hiatal hernia or other gastric abnormality. Normal duodenal course and caliber. --Small bowel: Unremarkable. --Colon: There is a very large amount of stool the level the rectum. There is scattered colonic  diverticula without CT evidence for diverticulitis. --Appendix: Not visualized. No right lower quadrant inflammation or free fluid. Vascular/Lymphatic: Atherosclerotic calcification is present within the non-aneurysmal abdominal aorta, without hemodynamically significant stenosis. --No retroperitoneal lymphadenopathy. --No mesenteric lymphadenopathy. --No pelvic or inguinal lymphadenopathy. Reproductive: Status post hysterectomy. No adnexal mass. Other: No ascites or free air. The abdominal wall is normal. Musculoskeletal. Again noted is a compression fracture of the T9 vertebral body. There has been significant interval height loss since the patient's prior study dated 07/25/2018. There is no new acute appearing compression fracture. IMPRESSION: 1. Findings are concerning for acute cholecystitis. As this is discordant with the patient's recent ultrasound, correlation with laboratory studies and HIDA scan is recommended. 2. There is a large amount of stool the level of the rectum. 3. There is a nonobstructing stone measuring approximately 6 mm in the distal left ureter, nearly at the left UVJ. 4. Significant bibasilar airspace disease favored to represent a combination of both atelectasis and consolidation, especially at the right lung base. 5. Hepatic steatosis. 6. Cardiomegaly. 7. Again noted is a T9 compression fracture with significant interval height loss since 2020. Aortic Atherosclerosis (ICD10-I70.0). Electronically Signed   By: Constance Holster M.D.   On: 06/20/2019 00:26   DG Chest Port 1 View  Result Date: 06/19/2019 CLINICAL DATA:  Shortness of breath and unresponsiveness EXAM: PORTABLE CHEST 1 VIEW COMPARISON:  07/25/2018 FINDINGS: Cardiac shadow is enlarged but stable. Aortic calcifications are again seen. Overall inspiratory effort is poor however no focal infiltrate is seen. Mild central vascular prominence is noted. No bony abnormality is noted. IMPRESSION: Mild central vascular congestion.   No focal infiltrate is noted. Electronically Signed   By: Inez Catalina M.D.   On: 06/19/2019 22:22   US Abdomen Limited RUQ  Result Date: 06/19/2019 CLINICAL DATA:  Transaminitis EXAM: ULTRASOUND ABDOMEN LIMITED RIGHT UPPER QUADRANT COMPARISON:  None. FINDINGS: Gallbladder: No gallstones or wall thickening visualized. No sonographic Murphy sign noted by sonographer. Common bile duct: Diameter: Normal caliber, 6 mm. Liver: Increased echotexture compatible with fatty infiltration. No focal abnormality or biliary ductal dilatation. Portal vein is patent on color Doppler imaging with normal direction of blood flow towards the liver. Other: None. IMPRESSION: Fatty infiltration of the liver. No acute findings. Electronically Signed   By: Rolm Baptise M.D.   On: 06/19/2019 23:29    EKG: Personally reviewed.  Rhythm is sinus tachycardia with heart rate of 101 bpm.  Notable poor R wave  progression through precordial leads.  No dynamic ST segment changes appreciated.  Assessment/Plan Principal Problem:   Sepsis due to Gram negative bacteria with organ dysfunction  Patient presenting with fevers and tachypnea in the setting of severe lactic acidosis thought to be secondary to sepsis due to intra-abdominal infection  Most likely source is acute cholecystitis based on CT imaging.  I have discussed the discrepancy of the CT imaging with the right upper quadrant ultrasound with Dr. Constance Holster with radiology.  He feels right upper quadrant ultrasound may have missed the abnormal gallbladder due to technical positioning of patient and placement of the probe.  He recommends obtaining HIDA scan in the morning to confirm this finding.  He also makes note that there is no evidence of common bile duct dilatation on either the ultrasound or CT imaging so cholangitis is extremely unlikely.  Clinically, with abdominal tenderness and derangement of LFTs overall patient's presentation is consistent with acute  cholecystitis, either calculus or a calculus.  I have discussed treatment options with the patient's daughter who is the decision-maker.  She prefers that we not proceed with surgical intervention if possible.  I have also discussed the case with Dr. Kae Heller with neurosurgery who also agrees that patient would not be an optimal surgical candidate.  She recommends intravenous antibiotics and if the patient clinically decompensates she feels that an IR guided percutaneous drain would be another potential option.  She will come physically see the patient later this morning.  Intravenous Zosyn has been initiated  Hydrating patient aggressively with intravenous isotonic fluids  Keeping patient n.p.o. for now considering nausea vomiting  Temporarily holding Xarelto in case of potential surgery although this is unlikely.  Blood cultures have been obtained.  Active Problems: Pneumonia of both lower lobes due to aspiration  Identified on CT imaging of the abdomen and pelvis, greater in right rather than left lower lobes  Possibly secondary to aspiration due to frequent bouts of vomiting.  Treating with intravenous Zosyn which should also provide adequate antibiotic coverage for pneumonia  As needed supplemental oxygen for bouts of hypoxia   Acute cholecystitis   Please see assessment and plan above    Lactic acidosis   Substantial lactic acidosis secondary to volume depletion and sepsis  Patient aggressively with intravenous isotonic fluids  Treating patient with intravenous antibiotic therapy  Performing serial lactic acid levels to ensure downtrending and resolution   Essential hypertension   Continue home regimen of metoprolol while being mindful for episodic hypotension considering concurrent sepsis.    GERD without esophagitis   Continue home regimen of PPI    Vascular dementia with behavior disturbance ([place])   According to the daughter patient has advanced  disease.  Attempting to minimize uncomfortable stimuli as much as possible.  Encourage family to remain at bedside is much as possible to minimize confusion.    Chronic anticoagulation   Patient is on chronic Xarelto due to history of DVT  Temporarily holding in case of surgical intervention although this is unlikely to proceed and can likely be resumed quickly.    Goals of care, counseling/discussion   Per my discussion with daughter who is the decision-maker, she wishes to not proceed with surgical intervention if possible.  I have confirmed with the daughter that patient is indeed a DNR.    Seizure disorder ([place])   Continue home regimen of Keppra    Left ureteral stone   Nonobstructive left ureteral stone incidentally found on CT imaging  No  evidence of hydronephrosis, perinephric stranding  Urinalysis not significantly suggestive of urinary tract infection.  I do not believe that urologic intervention is necessary at this time.   Code Status:  DNR Family Communication: Discussed with daughter via phone conversation who has been updated on plan of care  Status is: Inpatient  Remains inpatient appropriate because:Altered mental status, IV treatments appropriate due to intensity of illness or inability to take PO and Inpatient level of care appropriate due to severity of illness   Dispo: The patient is from: SNF              Anticipated d/c is to: SNF              Anticipated d/c date is: > 3 days              Patient currently is not medically stable to d/c.        Vernelle Emerald MD [facility] Pager [phone]  If 7PM-7AM, please contact night-coverage [email or web address] Use universal  password for that web site. If you do not have the password, please call the hospital operator.  06/20/2019, 1:47 AM

## 2019-06-20 NOTE — Consult Note (Signed)
Surgical Evaluation Requesting provider: Dr. Cyd Silence  Chief Complaint: altered mental status  HPI: History taken from chart review as patient is unable to provide a history.  This is an 86 year old woman with multiple medical problems including advanced vascular dementia, history of DVT on Xarelto, hemorrhagic stroke with residual right-sided weakness, was brought to the [facility] emergency room from her skilled nursing facility with lethargy and vomiting.  By report for the last 2 days she has had bouts of lethargy, confusion and agitation as well as decreased oral intake.  She began having nonbilious nonbloody emesis on the day of presentation.  On arrival to the emergency room, she was noted to be febrile with a temperature of 103, tachypneic, tachycardic and acidotic with a lactate of 5.5.  White blood cell count is normal. She was noted to have elevated liver enzymes and a total bilirubin 2.3 therefore right upper quadrant ultrasound was completed which was negative for cholecystitis.  Subsequent CT scan does note inflammation around the gallbladder as well as the possible bibasilar pneumonia.  Other incidental findings include a large amount of stool at the level of the rectum, nonobstructing left ureteral stone, hepatic steatosis, cardiomegaly and a T9 compression fracture.  Urinalysis is positive for nitrite and bacteria.  Surgery is asked to consult regarding the possibility of cholecystitis in the setting of sepsis.   Allergies  Allergen Reactions  . Hydrocodone Nausea And Vomiting  . Morphine And Related Nausea And Vomiting  . Actonel [Risedronate Sodium] Other (See Comments)    unknown  . Darvon [Propoxyphene Hcl] Other (See Comments)    unknown  . Oxycodone Hcl Other (See Comments)    unknown  . Pneumovax [Pneumococcal Polysaccharide Vaccine] Other (See Comments)    unknown    Past Medical History:  Diagnosis Date  . Acute encephalopathy   . Aphasia following unspecified  cerebrovascular disease   . 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 Depression   . Dysphagia following cerebrovascular disease   . Hepatic steatosis   . Hydroureteronephrosis   . Hypertension   . Insomnia   . Macular degeneration   . Major depressive disorder, recurrent ([place])   . Neuralgia and neuritis, unspecified ([place])   . Nontraumatic intracranial hemorrhage ([place])   . Stroke ([place])   . 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   . 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 ABDOMINAL HYSTERECTOMY    . APPENDECTOMY    . BREAST SURGERY    . MASTECTOMY Right     History reviewed. No pertinent family history.  Social History   Socioeconomic History  . Marital status: Widowed    Spouse name: Not on file  . Number of children: Not on file  . Years of education: Not on file  . Highest education level: Not on file  Occupational History  . Not on file  Tobacco Use  . Smoking status: Never Smoker  . Smokeless tobacco: Never Used  Substance and Sexual Activity  . Alcohol use: No  . Drug use: No  . Sexual activity: Never  Other Topics Concern  . Not on file  Social History Narrative   Lives at [facility], [place] since 04/25/2013   Widowed   Never smoked   Alcohol none   DNR, POA, Living Will   Social Determinants of Health   Financial Resource Strain:   .  Difficulty of Paying Living Expenses:   Food Insecurity:   . Worried About Charity fundraiser in the Last Year:   . Arboriculturist in the Last Year:   Transportation Needs:   . Film/video editor (Medical):   Marland Kitchen Lack of Transportation (Non-Medical):   Physical Activity:   . Days of Exercise per Week:   . Minutes of Exercise per Session:   Stress:   . Feeling of Stress :   Social Connections:   . Frequency of Communication with Friends and Family:   . Frequency of Social Gatherings  with Friends and Family:   . Attends Religious Services:   . Active Member of Clubs or Organizations:   . Attends Archivist Meetings:   Marland Kitchen Marital Status:     No current facility-administered medications on file prior to encounter.   Current Outpatient Medications on File Prior to Encounter  Medication Sig Dispense Refill  . acetaminophen (TYLENOL) 325 MG tablet Take 650 mg by mouth every 4 (four) hours as needed.    . bisacodyl (DULCOLAX) 10 MG suppository Place 10 mg rectally daily as needed for mild constipation or moderate constipation.    . hydrocortisone 2.5 % lotion Apply topically 2 (two) times daily as needed. Apply to itchy  area on face.    . levETIRAcetam (KEPPRA) 100 MG/ML solution Take 250 mg by mouth. 250 mg = 2.5 ML; oral Twice A Day  Morning: 7am-10am Evening: 7pm-10pm    . loratadine (CLARITIN) 10 MG tablet Take 10 mg by mouth daily. 7am-10am.    . LORazepam (ATIVAN) 0.5 MG tablet Take one tablet by mouth twice daily as needed 60 tablet 0  . metoprolol tartrate (LOPRESSOR) 25 MG tablet Take 12.5 mg by mouth 2 (two) times daily. Morning: 7am-10am Evening: 7pm-10pm.    . mirtazapine (REMERON) 15 MG tablet Take 7.5 mg by mouth at bedtime. 7pm-10pm.    . Nutritional Supplements (NUTRITIONAL SUPPLEMENT PO) Take 1 each by mouth in the morning and at bedtime. Magic Cup    . nystatin (NYSTATIN) powder Apply 1 application topically daily as needed. 100,000unit Under left breast    . nystatin cream (MYCOSTATIN) Apply 1 application topically See admin instructions. Mix with triamcinolone cream and apply to reddened area on back and buttocks with each continent episode and prn, then cover with zinc oxide barrier cream.    . omeprazole (PRILOSEC) 10 MG capsule Take 10 mg by mouth daily. 7am-10am.    . polyethylene glycol (MIRALAX / GLYCOLAX) packet Take 17 g by mouth daily. 7am-10am.    . risperiDONE (RISPERDAL) 0.25 MG tablet Take 0.25 mg by mouth every evening. 7pm-10pm     . Rivaroxaban (XARELTO) 15 MG TABS tablet Take 15 mg by mouth every evening. 4pm-6pm.    . sertraline (ZOLOFT) 100 MG tablet Take 100 mg by mouth at bedtime. 7pm-10pm. Take with 25 mg tablet    . sertraline (ZOLOFT) 25 MG tablet Take 25 mg by mouth at bedtime. 7pm-10pm. Give 25mg  along with 100mg  to =125mg .    . simethicone (MYLICON) 80 MG chewable tablet Chew 80 mg by mouth 3 (three) times daily with meals.    . triamcinolone cream (KENALOG) 0.5 % Apply 1 application topically See admin instructions. TID and PRN. Mix with Nystatin cream and apply to reddened areas on back and buttocks with each incontinent episode and prn, then cover with zinc oxide barrier cream    . triamcinolone ointment (KENALOG) 0.1 %  Review of Systems: a complete, 10pt review of systems was unable to be completed due to patient mental status  Physical Exam: Vitals:   06/20/19 0245 06/20/19 0315  BP: (!) 106/58 (!) 110/56  Pulse: (!) 101 99  Resp: (!) 23 (!) 24  Temp:    SpO2: 96% 95%   Gen: Sleeping, easily awakens to voice, answers yes/no questions Eyes: lids and conjunctivae normal, no icterus. Pupils equally round and reactive to light.  Neck: supple without mass or thyromegaly Chest: respiratory effort is normal. No crepitus or tenderness on palpation of the chest.  Cardiovascular: RRR with palpable distal pulses, no pedal edema Gastrointestinal: soft, nondistended, mildly subjectively diffusely tender.  No peritonitis. No mass, hepatomegaly or splenomegaly. No hernia.  Well-healed low midline vertical scar Muscoloskeletal: no clubbing or cyanosis of the fingers.  No deformity or contracture. Neuro: Follows some commands, sensation grossly intact Psych: Unable to assess due to mental status Skin: warm and dry   CBC Latest Ref Rng & Units 06/19/2019 08/03/2018 07/26/2018  WBC 4.0 - 10.5 K/uL 7.7 7.1 7.7  Hemoglobin 12.0 - 15.0 g/dL 15.2(H) 14.3 13.2  Hematocrit 36 - 46 % 48.4(H) 43 41.4  Platelets  150 - 400 K/uL 222 245 214    CMP Latest Ref Rng & Units 06/19/2019 06/19/2019 08/03/2018  Glucose 70 - 99 mg/dL - 159(H) -  BUN 8 - 23 mg/dL - 14 16  Creatinine 0.44 - 1.00 mg/dL - 1.28(H) 1.1  Sodium 135 - 145 mmol/L - 147(H) 141  Potassium 3.5 - 5.1 mmol/L - 3.1(L) 3.9  Chloride 98 - 111 mmol/L - 111 108  CO2 22 - 32 mmol/L - 20(L) 23  Calcium 8.9 - 10.3 mg/dL - 8.4(L) 8.7  Total Protein 6.5 - 8.1 g/dL 6.3(L) 6.4(L) -  Total Bilirubin 0.3 - 1.2 mg/dL 2.1(H) 2.3(H) -  Alkaline Phos 38 - 126 U/L 144(H) 152(H) -  AST 15 - 41 U/L 234(H) 231(H) -  ALT 0 - 44 U/L 152(H) 157(H) -    Lab Results  Component Value Date   INR 1.3 (H) 06/19/2019   INR 2.3 (A) 07/19/2015   PROTIME 23.4 (A) 07/19/2015    Imaging: CT ABDOMEN PELVIS W CONTRAST  Result Date: 06/20/2019 CLINICAL DATA:  Biliary colic. EXAM: CT ABDOMEN AND PELVIS WITH CONTRAST TECHNIQUE: Multidetector CT imaging of the abdomen and pelvis was performed using the standard protocol following bolus administration of intravenous contrast. CONTRAST:  20mL OMNIPAQUE IOHEXOL 300 MG/ML  SOLN COMPARISON:  July 13, 2016 FINDINGS: Lower chest: Significant bibasilar airspace disease is noted favored to represent a combination of both atelectasis and consolidation, especially at the right lung base.The heart is enlarged. Hepatobiliary: There is decreased hepatic attenuation suggestive of hepatic steatosis. There appears to be some gallbladder wall thickening and adjacent inflammatory changes. There appears to be pericholecystic free fluid.There is no biliary ductal dilation. Pancreas: Normal contours without ductal dilatation. No peripancreatic fluid collection. Spleen: Unremarkable. Adrenals/Urinary Tract: --Adrenal glands: Unremarkable. --Right kidney/ureter: No hydronephrosis or radiopaque kidney stones. --Left kidney/ureter: There is an at least partially duplicated left-sided collecting system. There is a nonobstructing stone measuring approximately  6 mm in the distal left ureter, nearly at the left UVJ (axial series 2, image 73). --Urinary bladder: Unremarkable. Stomach/Bowel: --Stomach/Duodenum: No hiatal hernia or other gastric abnormality. Normal duodenal course and caliber. --Small bowel: Unremarkable. --Colon: There is a very large amount of stool the level the rectum. There is scattered colonic diverticula without CT evidence for diverticulitis. --Appendix:  Not visualized. No right lower quadrant inflammation or free fluid. Vascular/Lymphatic: Atherosclerotic calcification is present within the non-aneurysmal abdominal aorta, without hemodynamically significant stenosis. --No retroperitoneal lymphadenopathy. --No mesenteric lymphadenopathy. --No pelvic or inguinal lymphadenopathy. Reproductive: Status post hysterectomy. No adnexal mass. Other: No ascites or free air. The abdominal wall is normal. Musculoskeletal. Again noted is a compression fracture of the T9 vertebral body. There has been significant interval height loss since the patient's prior study dated 07/25/2018. There is no new acute appearing compression fracture. IMPRESSION: 1. Findings are concerning for acute cholecystitis. As this is discordant with the patient's recent ultrasound, correlation with laboratory studies and HIDA scan is recommended. 2. There is a large amount of stool the level of the rectum. 3. There is a nonobstructing stone measuring approximately 6 mm in the distal left ureter, nearly at the left UVJ. 4. Significant bibasilar airspace disease favored to represent a combination of both atelectasis and consolidation, especially at the right lung base. 5. Hepatic steatosis. 6. Cardiomegaly. 7. Again noted is a T9 compression fracture with significant interval height loss since 2020. Aortic Atherosclerosis (ICD10-I70.0). Electronically Signed   By: Constance Holster M.D.   On: 06/20/2019 00:26   DG Chest Port 1 View  Result Date: 06/19/2019 CLINICAL DATA:  Shortness of  breath and unresponsiveness EXAM: PORTABLE CHEST 1 VIEW COMPARISON:  07/25/2018 FINDINGS: Cardiac shadow is enlarged but stable. Aortic calcifications are again seen. Overall inspiratory effort is poor however no focal infiltrate is seen. Mild central vascular prominence is noted. No bony abnormality is noted. IMPRESSION: Mild central vascular congestion.  No focal infiltrate is noted. Electronically Signed   By: Inez Catalina M.D.   On: 06/19/2019 22:22   US Abdomen Limited RUQ  Result Date: 06/19/2019 CLINICAL DATA:  Transaminitis EXAM: ULTRASOUND ABDOMEN LIMITED RIGHT UPPER QUADRANT COMPARISON:  None. FINDINGS: Gallbladder: No gallstones or wall thickening visualized. No sonographic Murphy sign noted by sonographer. Common bile duct: Diameter: Normal caliber, 6 mm. Liver: Increased echotexture compatible with fatty infiltration. No focal abnormality or biliary ductal dilatation. Portal vein is patent on color Doppler imaging with normal direction of blood flow towards the liver. Other: None. IMPRESSION: Fatty infiltration of the liver. No acute findings. Electronically Signed   By: Rolm Baptise M.D.   On: 06/19/2019 23:29     A/P: 86 year old woman multiple severe medical problems including advanced dementia, history of stroke, history of DVT on anticoagulation who presents with fever,altered mental status and lactic acidosis.  She has a couple different findings on her work-up that may be contributing including possible cholecystitis noted on CT scan, possible pneumonia noted on CT scan, and UTI.  It would be unusual for cholecystitis to cause such a high fever and lactic acidosis although part of this may be that she is dehydrated from a few days of decreased oral intake. Agree with current plans for fluid resuscitation, empiric broad-spectrum antibiotics (Zosyn), hold anticoagulation (okay to start a heparin drip if needed), and keep patient NPO.   A HIDA scan is pending and I do think that this is  reasonable to rule in or rule out cholecystitis given the discrepancy between ultrasound and CT scan.  If the HIDA is positive or if the patient decompensates prior to that test, I would recommend consulting interventional radiology for percutaneous cholecystostomy tube. Surgery will continue to follow along.    Patient Active Problem List   Diagnosis Date Noted  . Severe sepsis with acute organ dysfunction due to Gram negative bacteria (  [place]) 06/20/2019  . Acute cholecystitis 06/20/2019  . Left ureteral stone 06/20/2019  . Lactic acidosis 06/20/2019  . Gram-negative sepsis with organ dysfunction ([place]) 06/20/2019  . Aspiration pneumonia of both lower lobes due to gastric secretions ([place]) 06/20/2019  . Allergic rhinitis 02/21/2019  . Rash 01/26/2019  . Urinary incontinence 01/26/2019  . Slow transit constipation 01/19/2019  . Compression fracture of T9 vertebra ([place]) 07/26/2018  . Seizure disorder ([place]) 07/25/2018  . Adult failure to thrive 06/11/2017  . Goals of care, counseling/discussion 03/19/2017  . Chronic anticoagulation 01/09/2017  . Gait abnormality 12/08/2016  . Psychosis ([place]) 08/21/2016  . Hallucination 08/14/2016  . Vascular dementia with behavior disturbance ([place]) 07/22/2016  . Dysphagia following cerebrovascular disease   . 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 Nontraumatic intracranial hemorrhage ([place])   . 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   . Major depressive disorder, recurrent ([place])   . SCC (squamous cell carcinoma), scalp/neck 11/10/2013  . GERD without esophagitis 09/29/2012  . Protein C deficiency ([place]) 06/14/2012  . Protein S deficiency ([place]) 06/14/2012  . DNR (do not resuscitate) 06/14/2012  . Essential hypertension 03/24/2012  . CVA (cerebral vascular accident) ([place]) 03/24/2012  . Osteoporosis 03/24/2012  . Macular degeneration 03/24/2012  . Aphasia 03/24/2012  . Dyslipidemia 03/24/2012       Romana Juniper, MD [facility]  See AMION to contact appropriate on-call provider

## 2019-06-20 NOTE — ED Notes (Addendum)
Report given to [employer] on 4W. There will be a delay in getting patient to the floor as she was assigned a bed while in nuclear medicine for a procedure. Pt will be taken to the floor once she is returned from nuclear medicine.

## 2019-06-20 NOTE — ED Notes (Signed)
Octaviano Glow, MD, stated that he wanted a total of 2L NS

## 2019-06-20 NOTE — Progress Notes (Signed)
Patient ID: Erica [name], female   DOB: [birth year], 86 y.o.   MRN: [id number]   HIDA scan not helpful regarding cholecystitis.  Radiotracer never made it out of the liver suggesting possible liver dysfunction.  For now, will hold on IR consult and perc drain and just treat with antibiotics.  If she worsens, will reconsider.  Will continue to follow

## 2019-06-20 NOTE — ED Provider Notes (Signed)
Nursing notes and vitals signs, including pulse oximetry, reviewed.  Summary of this visit's results, reviewed by myself:  EKG:  EKG Interpretation  Date/Time:  Sunday June 19 2019 22:02:34 EDT Ventricular Rate:  101 PR Interval:    QRS Duration: 101 QT Interval:  420 QTC Calculation: 545 R Axis:   -23 Text Interpretation: Sinus tachycardia Prolonged PR interval Borderline left axis deviation Low voltage, precordial leads Abnormal R-wave progression, late transition NO STEMI Confirmed by Octaviano Glow [phone]) on 06/19/2019 10:04:16 PM       Labs:  Results for orders placed or performed during the hospital encounter of 06/19/19 (from the past 24 hour(s))  Lactic acid, plasma     Status: Abnormal   Collection Time: 06/19/19 10:00 PM  Result Value Ref Range   Lactic Acid, Venous 5.5 (HH) 0.5 - 1.9 mmol/L  Comprehensive metabolic panel     Status: Abnormal   Collection Time: 06/19/19 10:00 PM  Result Value Ref Range   Sodium 147 (H) 135 - 145 mmol/L   Potassium 3.1 (L) 3.5 - 5.1 mmol/L   Chloride 111 98 - 111 mmol/L   CO2 20 (L) 22 - 32 mmol/L   Glucose, Bld 159 (H) 70 - 99 mg/dL   BUN 14 8 - 23 mg/dL   Creatinine, Ser 1.28 (H) 0.44 - 1.00 mg/dL   Calcium 8.4 (L) 8.9 - 10.3 mg/dL   Total Protein 6.4 (L) 6.5 - 8.1 g/dL   Albumin 3.6 3.5 - 5.0 g/dL   AST 231 (H) 15 - 41 U/L   ALT 157 (H) 0 - 44 U/L   Alkaline Phosphatase 152 (H) 38 - 126 U/L   Total Bilirubin 2.3 (H) 0.3 - 1.2 mg/dL   GFR calc non Af Amer 37 (L) >60 mL/min   GFR calc Af Amer 43 (L) >60 mL/min   Anion gap 16 (H) 5 - 15  CBC WITH DIFFERENTIAL     Status: Abnormal   Collection Time: 06/19/19 10:00 PM  Result Value Ref Range   WBC 7.7 4.0 - 10.5 K/uL   RBC 5.03 3.87 - 5.11 MIL/uL   Hemoglobin 15.2 (H) 12.0 - 15.0 g/dL   HCT 48.4 (H) 36 - 46 %   MCV 96.2 80.0 - 100.0 fL   MCH 30.2 26.0 - 34.0 pg   MCHC 31.4 30.0 - 36.0 g/dL   RDW 14.9 11.5 - 15.5 %   Platelets 222 150 - 400 K/uL   nRBC 0.0 0.0 - 0.2 %    Neutrophils Relative % 90 %   Neutro Abs 7.0 1.7 - 7.7 K/uL   Lymphocytes Relative 4 %   Lymphs Abs 0.3 (L) 0.7 - 4.0 K/uL   Monocytes Relative 5 %   Monocytes Absolute 0.4 0 - 1 K/uL   Eosinophils Relative 0 %   Eosinophils Absolute 0.0 0 - 0 K/uL   Basophils Relative 0 %   Basophils Absolute 0.0 0 - 0 K/uL   Immature Granulocytes 1 %   Abs Immature Granulocytes 0.04 0.00 - 0.07 K/uL  APTT     Status: None   Collection Time: 06/19/19 10:00 PM  Result Value Ref Range   aPTT 27 24 - 36 seconds  Protime-INR     Status: Abnormal   Collection Time: 06/19/19 10:00 PM  Result Value Ref Range   Prothrombin Time 15.3 (H) 11.4 - 15.2 seconds   INR 1.3 (H) 0.8 - 1.2  Urinalysis, Routine w reflex microscopic     Status:  Abnormal   Collection Time: 06/19/19 10:00 PM  Result Value Ref Range   Color, Urine YELLOW YELLOW   APPearance CLEAR CLEAR   Specific Gravity, Urine 1.012 1.005 - 1.030   pH 5.0 5.0 - 8.0   Glucose, UA NEGATIVE NEGATIVE mg/dL   Hgb urine dipstick MODERATE (A) NEGATIVE   Bilirubin Urine NEGATIVE NEGATIVE   Ketones, ur NEGATIVE NEGATIVE mg/dL   Protein, ur NEGATIVE NEGATIVE mg/dL   Nitrite POSITIVE (A) NEGATIVE   Leukocytes,Ua TRACE (A) NEGATIVE   RBC / HPF 0-5 0 - 5 RBC/hpf   WBC, UA 6-10 0 - 5 WBC/hpf   Bacteria, UA MANY (A) NONE SEEN   Squamous Epithelial / LPF 0-5 0 - 5    Imaging Studies: CT ABDOMEN PELVIS W CONTRAST  Result Date: 06/20/2019 CLINICAL DATA:  Biliary colic. EXAM: CT ABDOMEN AND PELVIS WITH CONTRAST TECHNIQUE: Multidetector CT imaging of the abdomen and pelvis was performed using the standard protocol following bolus administration of intravenous contrast. CONTRAST:  41mL OMNIPAQUE IOHEXOL 300 MG/ML  SOLN COMPARISON:  July 13, 2016 FINDINGS: Lower chest: Significant bibasilar airspace disease is noted favored to represent a combination of both atelectasis and consolidation, especially at the right lung base.The heart is enlarged. Hepatobiliary:  There is decreased hepatic attenuation suggestive of hepatic steatosis. There appears to be some gallbladder wall thickening and adjacent inflammatory changes. There appears to be pericholecystic free fluid.There is no biliary ductal dilation. Pancreas: Normal contours without ductal dilatation. No peripancreatic fluid collection. Spleen: Unremarkable. Adrenals/Urinary Tract: --Adrenal glands: Unremarkable. --Right kidney/ureter: No hydronephrosis or radiopaque kidney stones. --Left kidney/ureter: There is an at least partially duplicated left-sided collecting system. There is a nonobstructing stone measuring approximately 6 mm in the distal left ureter, nearly at the left UVJ (axial series 2, image 73). --Urinary bladder: Unremarkable. Stomach/Bowel: --Stomach/Duodenum: No hiatal hernia or other gastric abnormality. Normal duodenal course and caliber. --Small bowel: Unremarkable. --Colon: There is a very large amount of stool the level the rectum. There is scattered colonic diverticula without CT evidence for diverticulitis. --Appendix: Not visualized. No right lower quadrant inflammation or free fluid. Vascular/Lymphatic: Atherosclerotic calcification is present within the non-aneurysmal abdominal aorta, without hemodynamically significant stenosis. --No retroperitoneal lymphadenopathy. --No mesenteric lymphadenopathy. --No pelvic or inguinal lymphadenopathy. Reproductive: Status post hysterectomy. No adnexal mass. Other: No ascites or free air. The abdominal wall is normal. Musculoskeletal. Again noted is a compression fracture of the T9 vertebral body. There has been significant interval height loss since the patient's prior study dated 07/25/2018. There is no new acute appearing compression fracture. IMPRESSION: 1. Findings are concerning for acute cholecystitis. As this is discordant with the patient's recent ultrasound, correlation with laboratory studies and HIDA scan is recommended. 2. There is a large  amount of stool the level of the rectum. 3. There is a nonobstructing stone measuring approximately 6 mm in the distal left ureter, nearly at the left UVJ. 4. Significant bibasilar airspace disease favored to represent a combination of both atelectasis and consolidation, especially at the right lung base. 5. Hepatic steatosis. 6. Cardiomegaly. 7. Again noted is a T9 compression fracture with significant interval height loss since 2020. Aortic Atherosclerosis (ICD10-I70.0). Electronically Signed   By: Constance Holster M.D.   On: 06/20/2019 00:26   DG Chest Port 1 View  Result Date: 06/19/2019 CLINICAL DATA:  Shortness of breath and unresponsiveness EXAM: PORTABLE CHEST 1 VIEW COMPARISON:  07/25/2018 FINDINGS: Cardiac shadow is enlarged but stable. Aortic calcifications are again seen. Overall inspiratory effort  is poor however no focal infiltrate is seen. Mild central vascular prominence is noted. No bony abnormality is noted. IMPRESSION: Mild central vascular congestion.  No focal infiltrate is noted. Electronically Signed   By: Inez Catalina M.D.   On: 06/19/2019 22:22   US Abdomen Limited RUQ  Result Date: 06/19/2019 CLINICAL DATA:  Transaminitis EXAM: ULTRASOUND ABDOMEN LIMITED RIGHT UPPER QUADRANT COMPARISON:  None. FINDINGS: Gallbladder: No gallstones or wall thickening visualized. No sonographic Murphy sign noted by sonographer. Common bile duct: Diameter: Normal caliber, 6 mm. Liver: Increased echotexture compatible with fatty infiltration. No focal abnormality or biliary ductal dilatation. Portal vein is patent on color Doppler imaging with normal direction of blood flow towards the liver. Other: None. IMPRESSION: Fatty infiltration of the liver. No acute findings. Electronically Signed   By: Rolm Baptise M.D.   On: 06/19/2019 23:29   We will have hospitalist admit for sepsis with unclear etiology.   Demari Gales, Jenny Reichmann, MD 06/20/19 [phone]

## 2019-06-20 NOTE — Progress Notes (Addendum)
Through secure chat the ED RN Wells Guiles  has documented that MD during was made aware of Lactic Acid results with orders of 2 liters NS. Dr. Langston Masker also messaged and returned message promptly while he was off shift making me aware that Dr. Florina Ou was  on duty. Dr. Florina Ou unavailable on secure chat but Ramond Dial has offered to find MD to make him aware of need for follow up lactic and that 70kg=2100cc fluids needed for Sepsis/Lactic of 5.5

## 2019-06-20 NOTE — Plan of Care (Signed)
Patients daughter is at bedside. Pt education booklet provided. Call bell is within reach. Bed alarm set.

## 2019-06-20 NOTE — Progress Notes (Signed)
  Echocardiogram 2D Echocardiogram has been performed.  Michiel Cowboy 06/20/2019, 3:21 PM

## 2019-06-20 NOTE — Progress Notes (Signed)
Lovey Newcomer NP made aware of L/A 5.3

## 2019-06-20 NOTE — Progress Notes (Signed)
Pharmacy Antibiotic Note  Erica [name] is a 86 y.o. female admitted on 06/19/2019 with nausea, vomiting, fever, hypoxia.  Pharmacy has been consulted for Zosyn dosing for possible intra-abdominal infection.  Plan: Zosyn 3.375gm IV q8h (4hr extended infusions) Monitor renal function, adjust dose if needed  Height: 5\' 3"  (160 cm) Weight: 70 kg (154 lb 5.2 oz) IBW/kg (Calculated) : 52.4  Temp (24hrs), Avg:101 F (38.3 C), Min:98.8 F (37.1 C), Max:103.1 F (39.5 C)  Recent Labs  Lab 06/19/19 2200 06/20/19 0000  WBC 7.7  --   CREATININE 1.28*  --   LATICACIDVEN 5.5* 5.3*    Estimated Creatinine Clearance: 27.9 mL/min (A) (by C-G formula based on SCr of 1.28 mg/dL (H)).    Allergies  Allergen Reactions  . Hydrocodone Nausea And Vomiting  . Morphine And Related Nausea And Vomiting  . Actonel [Risedronate Sodium] Other (See Comments)    unknown  . Darvon [Propoxyphene Hcl] Other (See Comments)    unknown  . Oxycodone Hcl Other (See Comments)    unknown  . Pneumovax [Pneumococcal Polysaccharide Vaccine] Other (See Comments)    unknown    Antimicrobials this admission: 6/13 Unasyn x 1 6/13 Zosyn >>  Dose adjustments this admission:  Microbiology results: 6/13 BCx: 6/13 UCx: 6/13 COVID:  Thank you for allowing pharmacy to be a part of this patient's care.  Peggyann Juba, PharmD, BCPS Pharmacy: [phone] 06/20/2019 1:09 AM

## 2019-06-20 NOTE — Progress Notes (Signed)
Patient arrived to unit. Pt is alert, disoriented (x4). Daughter is at bedside and reports this is patients baseline. Coban removed from left wrist. Left forearm is edematous. Back, buttocks and left knee excoriated. MASD buttocks perineum.

## 2019-06-21 DIAGNOSIS — R6520 Severe sepsis without septic shock: Secondary | ICD-10-CM

## 2019-06-21 DIAGNOSIS — R7401 Elevation of levels of liver transaminase levels: Secondary | ICD-10-CM

## 2019-06-21 DIAGNOSIS — A4150 Gram-negative sepsis, unspecified: Secondary | ICD-10-CM

## 2019-06-21 DIAGNOSIS — R17 Unspecified jaundice: Secondary | ICD-10-CM

## 2019-06-21 LAB — BLOOD CULTURE ID PANEL (REFLEXED)

## 2019-06-21 LAB — COMPREHENSIVE METABOLIC PANEL
ALT: 132 U/L — ABNORMAL HIGH (ref 0–44)
AST: 123 U/L — ABNORMAL HIGH (ref 15–41)
Albumin: 2.9 g/dL — ABNORMAL LOW (ref 3.5–5.0)
Alkaline Phosphatase: 102 U/L (ref 38–126)
Anion gap: 10 (ref 5–15)
BUN: 11 mg/dL (ref 8–23)
CO2: 20 mmol/L — ABNORMAL LOW (ref 22–32)
Calcium: 7.2 mg/dL — ABNORMAL LOW (ref 8.9–10.3)
Chloride: 114 mmol/L — ABNORMAL HIGH (ref 98–111)
Creatinine, Ser: 1.01 mg/dL — ABNORMAL HIGH (ref 0.44–1.00)
GFR calc Af Amer: 57 mL/min — ABNORMAL LOW (ref >60)
GFR calc non Af Amer: 49 mL/min — ABNORMAL LOW (ref >60)
Glucose, Bld: 93 mg/dL (ref 70–99)
Potassium: 3.6 mmol/L (ref 3.5–5.1)
Sodium: 144 mmol/L (ref 135–145)
Total Bilirubin: 2.9 mg/dL — ABNORMAL HIGH (ref 0.3–1.2)
Total Protein: 5.5 g/dL — ABNORMAL LOW (ref 6.5–8.1)

## 2019-06-21 LAB — GLUCOSE, CAPILLARY
Glucose-Capillary: 102 mg/dL — ABNORMAL HIGH (ref 70–99)
Glucose-Capillary: 82 mg/dL (ref 70–99)
Glucose-Capillary: 93 mg/dL (ref 70–99)
Glucose-Capillary: 94 mg/dL (ref 70–99)

## 2019-06-21 LAB — CBC
HCT: 37 % (ref 36.0–46.0)
Hemoglobin: 11.6 g/dL — ABNORMAL LOW (ref 12.0–15.0)
MCH: 29.9 pg (ref 26.0–34.0)
MCHC: 31.4 g/dL (ref 30.0–36.0)
MCV: 95.4 fL (ref 80.0–100.0)
Platelets: 167 10*3/uL (ref 150–400)
RBC: 3.88 MIL/uL (ref 3.87–5.11)
RDW: 15.6 % — ABNORMAL HIGH (ref 11.5–15.5)
WBC: 9.3 10*3/uL (ref 4.0–10.5)
nRBC: 0 % (ref 0.0–0.2)

## 2019-06-21 LAB — PROCALCITONIN: Procalcitonin: 64.14 ng/mL

## 2019-06-21 LAB — MAGNESIUM: Magnesium: 1.8 mg/dL (ref 1.7–2.4)

## 2019-06-21 MED ORDER — ENOXAPARIN SODIUM 60 MG/0.6ML ~~LOC~~ SOLN
1.0000 mg/kg | Freq: Two times a day (BID) | SUBCUTANEOUS | Status: DC
  Administered 2019-06-21 – 2019-06-23 (×5): 60 mg via SUBCUTANEOUS
  Filled 2019-06-21 (×6): qty 0.6

## 2019-06-21 MED ORDER — LORAZEPAM 2 MG/ML IJ SOLN
0.5000 mg | Freq: Two times a day (BID) | INTRAMUSCULAR | Status: DC | PRN
  Administered 2019-06-21 (×2): 0.5 mg via INTRAVENOUS
  Filled 2019-06-21 (×2): qty 1

## 2019-06-21 MED ORDER — IPRATROPIUM-ALBUTEROL 0.5-2.5 (3) MG/3ML IN SOLN: 3.0000 mL | RESPIRATORY_TRACT | Status: DC | PRN

## 2019-06-21 NOTE — Evaluation (Signed)
Clinical/Bedside Swallow Evaluation Patient Details  Name: Erica [name] MRN: [id number] Date of Birth: 1927-01-17  Today's Date: 06/21/2019 Time: SLP Start Time (ACUTE ONLY): 0910 SLP Stop Time (ACUTE ONLY): 0934 SLP Time Calculation (min) (ACUTE ONLY): 24 min  Past Medical History:  Past Medical History:  Diagnosis Date  . Acute encephalopathy   . Aphasia following unspecified cerebrovascular disease   . 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 Depression   . Dysphagia following cerebrovascular disease   . Hepatic steatosis   . Hydroureteronephrosis   . Hypertension   . Insomnia   . Macular degeneration   . Major depressive disorder, recurrent ([place])   . Neuralgia and neuritis, unspecified ([place])   . Nontraumatic intracranial hemorrhage ([place])   . Stroke ([place])   . 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   . 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 ABDOMINAL HYSTERECTOMY    . APPENDECTOMY    . BREAST SURGERY    . MASTECTOMY Right    HPI:  86 yo female adm to [facility] with acute cholecystitis vs aspiration PNA.  Pt PMH + for cva 2013 at left occipital and parietal lobes with dysphagia, vision deficits.  Since stroke, pt has been in some sort of rehab place - staying in [place] and then [facility].  She has reportedly had a functional decline over the last few months - requiring someone to feed her over the last month per her daughter.  Pt with liver dysfunction per labs, HIDDA scan inconclusive and at this time plan for possible biliary drain on hold.  Imaging 06/20/2019 Showing significant bibasilar airspace disease favored to represent combination of both atelectasis and consolidation especially at the right lung base.    There is question if pt may have aspirated from vomiting prior to admission per MD.  Daughter [name] reports she fed her  mother one month ago and though her intake was poor she tolerated without coughing.RR at baseline today is 30 with accessory muscle use noted.  Pt currently presents with congested cough - that is not productive.  Daughter [name] reports pt's breathing is not at baseline level.   Assessment / Plan / Recommendation Clinical Impression  Patient currently presents with clinical indications concerning for exacerbation of possible chronic oropharyngeal dysphagia *likely some baseline from prior CVA*.  Baseline dysphagia likely due to her deconditioning, current respiratory status and acute medical issue. Daughter [name] admits pt has experienced a progressive decline in recent months.  No focal CN deficits apparent but pt is grossly weak. Nonproductive reflexive cough post-swallow of nectar via cup present. Cued cough and voice are weaker than normal.  As pt's RR is 30 at rest, she is demonstrating + clinical indications concerning for aspiration/dysphagia and possible pna - potential treatment/evaluation options reviewed with pt and daughter. Options included po with mitigation strategies in place with known aspiration risk, po of single small ice chip only and medicine by RN with puree/nectar via tsp and MBS today or MBS tomorrow dependent on care plan. Advised would speak to MD re: potential plans/options.  As this time, clearly pt's daughter [name] indicates goals are not just "comfort" for Ms Lozito. SLP Visit Diagnosis: Dysphagia, oropharyngeal phase (R13.12)    Aspiration Risk  Moderate aspiration risk    Diet Recommendation NPO except meds;Ice chips PRN after oral care (medicine  with puree, tsps nectar given by RN only)   Liquid Administration via: Spoon Medication Administration: Whole meds with puree (if large crush) Supervision: Full supervision/cueing for compensatory strategies Compensations: Slow rate;Small sips/bites Postural Changes: Seated upright at 90 degrees;Remain upright for at least  30 minutes after po intake    Other  Recommendations Oral Care Recommendations: Oral care QID Other Recommendations: Order thickener from pharmacy;Have oral suction available   Follow up Recommendations        Frequency and Duration min 2x/week  1 week       Prognosis Prognosis for Safe Diet Advancement: Fair Barriers to Reach Goals: Cognitive deficits      Swallow Study   General Date of Onset: 06/21/19 HPI: 86 yo female adm to [facility] with acute cholecystitis vs aspiration PNA.  Pt PMH + for cva 2013 at left occipital and parietal lobes with dysphagia, vision deficits.  Since stroke, pt has been in some sort of rehab place - staying in [place] and then in [facility].  She has reportedly had a functional decline over the last few months - requiring someone to feed her over the last month per her daughter.  Pt with liver dysfunction per labs, HIDDA scan inconclusive and at this time plan for possible biliary drain on hold.  Imaging 06/20/2019 Showing significant bibasilar airspace disease favored to represent combination of both atelectasis and consolidation especially at the right lung base.    There is question if pt may have aspirated from vomiting prior to admission per MD.  Daughter [name] reports she fed her mother one month ago and though her intake was poor she tolerated without coughing.RR at baseline today is 30 with accessory muscle use noted.  Pt currently presents with congested cough - that is not productive.  Daughter [name] reports pt's breathing is not at baseline level. Type of Study: Bedside Swallow Evaluation Diet Prior to this Study: NPO Temperature Spikes Noted: No Respiratory Status: Nasal cannula History of Recent Intubation: No Behavior/Cognition: Alert;Cooperative;Pleasant mood Oral Cavity Assessment: Dry Oral Care Completed by SLP: Yes Oral Cavity - Dentition: Adequate natural dentition Vision: Functional for self-feeding Self-Feeding Abilities: Needs assist;Able  to feed self with adaptive devices Patient Positioning: Upright in bed Baseline Vocal Quality: Low vocal intensity Volitional Cough: Weak Volitional Swallow: Able to elicit    Oral/Motor/Sensory Function Overall Oral Motor/Sensory Function: Generalized oral weakness   Ice Chips Ice chips: Impaired Oral Phase Impairments: Reduced lingual movement/coordination Oral Phase Functional Implications: Prolonged oral transit Pharyngeal Phase Impairments: Suspected delayed Swallow   Thin Liquid Thin Liquid: Not tested    Nectar Thick Nectar Thick Liquid: Impaired Presentation: Cup;Spoon;Straw Oral Phase Impairments: Reduced lingual movement/coordination Oral phase functional implications: Prolonged oral transit Pharyngeal Phase Impairments: Suspected delayed Swallow;Cough - Delayed   Honey Thick Honey Thick Liquid: Not tested   Puree Puree: Impaired Presentation: Self Fed;Spoon Oral Phase Impairments: Reduced lingual movement/coordination Oral Phase Functional Implications: Prolonged oral transit Pharyngeal Phase Impairments: Suspected delayed Swallow   Solid     Solid: Not tested Other Comments: due to pt's dysarthria      [name] 06/21/2019,12:07 PM   Kathleen Lime, MS [place] Office [phone]

## 2019-06-21 NOTE — Consult Note (Addendum)
Referring Provider: Saverio Danker PA-C Primary Care Physician:  Mast, Man X, NP Primary Gastroenterologist:  Althia Forts   Reason for Consultation:  Elevated LFTs  HPI: Erica [name] is a 86 y.o. 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 She presented to [facility] emergency room on 06/19/2019 with fever, vomiting and lethargy.  She has dementia and she resides at the [facility] memory care center.  In the ED, her temperature was 103 F and she was tachycardic. WBC 14.4. Lactic acidosis level was 5.5.  LFTs were elevated with AST 234. ALT 152.  Total bili 2.1. Acute hepatitis panel negative. Her lowest BP was around 106/58. A right upper quadrant ultrasound showed hepatic steatosis, no evidence  for acute cholecystitis.  However, an abdominal/pelvic CT was concerning for possible acute cholecystitis with possible bibasilar pneumonia particularly to the right lower lobe.  Since there was a discrepancy regarding possible cholecystitis per CTAP but not seen on sono, a HIDA scan was ordered. The HIDA was inconclusive as the radiotracer never made it out of the liver suggesting possible liver dysfunction/hepatitis.  She was started on Zosyn IV.  Blood cultures were positive for  Enterobacteriacea and E. Coli.  Urine cultures were positive for Klebsiella pneumonia and Enterococcus Faecium. A surgical consult was requested due to possible concerns of cholecystitis in the setting of sepsis.  A cholecystostomy tube was considered if her symptoms and LFTs worsened.  GI consult was requested to evaluate her elevated LFTs. She is a DNR/DNI. She is palliative care.    Her daughter is at the bedside. She provided the patient's history as her mother has dementia. No known history of elevated liver enzymes in the past or since residing at the memory center. She had a history of hepatitis  in 1954, further details are unclear. No history of alcohol use. Her appetite is typically fair. Her daughter is unable to provide information about her urinary or bladder habits when at the memory center. She passed a BM this morning. No reports of blood per the rectum or melena. She is under palliative care and the daughter does not wish to have any invasive evaluation i.e. liver biopsy but mostly wants to verify if her elevated liver enzymes is a new development.   ADDENDUM: I contacted the nursing staff at [facility]. I spoke to [employer]. She reviewed the patient's MAR, no new medications have been started within the past 6 months. The patient last had LFTs done on 04/22/2018, verbal results: Alk phos 84. T. Bili 0.3. AST 14. ALT 16. T. Bili 0.3. CBC 08/03/2018 verbal results: Hg 14.3. HCT 43.1. PLT 245. I have requested a hard copy of these results.  Crystal RN also verified the patient typically has a good appetite, eats about 75% of her meals. She is incontinent of urine and stool, no reports of melena or rectal bleeding.   Sars coronavirus 2 negative    Hepatic Function Latest Ref Rng & Units 06/21/2019 06/20/2019 06/19/2019  Total Protein 6.5 - 8.1 g/dL 5.5(L) 5.2(L) 6.3(L)  Albumin 3.5 - 5.0 g/dL 2.9(L) 2.8(L) 3.4(L)  AST 15 - 41 U/L 123(H) 234(H) 234(H)  ALT 0 - 44 U/L 132(H) 175(H) 152(H)  Alk Phosphatase 38 - 126 U/L 102 110 144(H)  Total Bilirubin 0.3 - 1.2 mg/dL 2.9(H) 2.3(H) 2.1(H)  Bilirubin, Direct 0.0 - 0.2 mg/dL - - 1.3(H)  CBC Latest Ref Rng & Units 06/21/2019 06/20/2019 06/20/2019  WBC 4.0 - 10.5 K/uL 9.3 14.4(H) SPECIMEN CLOTTED  Hemoglobin 12.0 - 15.0 g/dL 11.6(L) 12.9 SPECIMEN CLOTTED  Hematocrit 36 - 46 % 37.0 42.3 SPECIMEN CLOTTED  Platelets 150 - 400 K/uL 167 210 SPECIMEN CLOTTED    RUQ sonogram 06/19/2019: Fatty infiltration of the liver. No acute findings.  Abdominal/pelvic CT 06/19/2019: 1. Findings are concerning for acute  cholecystitis. As this is discordant with the patient's recent ultrasound, correlation with laboratory studies and HIDA scan is recommended. 2. There is a large amount of stool the level of the rectum. 3. There is a nonobstructing stone measuring approximately 6 mm in the distal left ureter, nearly at the left UVJ. 4. Significant bibasilar airspace disease favored to represent a combination of both atelectasis and consolidation, especially at the right lung base. 5. Hepatic steatosis. 6. Cardiomegaly. 7. Again noted is a T9 compression fracture with significant interval height loss since 2020. Aortic Atherosclerosis   HIDA 06/20/2019:  No excretion of radiotracer into the bowel. Bile duct obstruction is not favored as the biliary tree with normal on comparison ultrasound and CT. Therefore favor hepatic dysfunction such as hepatitis.    Past Medical History:  Diagnosis Date  . Acute encephalopathy   . Aphasia following unspecified cerebrovascular disease   . 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 Depression   . Dysphagia following cerebrovascular disease   . Hepatic steatosis   . Hydroureteronephrosis   . Hypertension   . Insomnia   . Macular degeneration   . Major depressive disorder, recurrent ([place])   . Neuralgia and neuritis, unspecified ([place])   . Nontraumatic intracranial hemorrhage ([place])   . Stroke ([place])   . 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   . 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 ABDOMINAL HYSTERECTOMY    . APPENDECTOMY    . BREAST SURGERY    . MASTECTOMY Right     Prior to Admission medications   Medication Sig Start Date End Date Taking? Authorizing Provider  acetaminophen (TYLENOL) 325 MG tablet Take 650 mg by mouth every 4 (four) hours as needed for mild pain.    Yes [provider]  bisacodyl (DULCOLAX) 10 MG  suppository Place 10 mg rectally daily as needed for mild constipation or moderate constipation.   Yes [provider]  hydrocortisone 2.5 % lotion Apply 1 application topically 2 (two) times daily as needed (face).    Yes [provider]  levETIRAcetam (KEPPRA) 100 MG/ML solution Take 250 mg by mouth 2 (two) times daily. 250 mg = 2.5 ML; oral Twice A Day  Morning: 7am-10am Evening: 7pm-10pm   Yes [provider]  loratadine (CLARITIN) 10 MG tablet Take 10 mg by mouth daily. 7am-10am.   Yes [provider]  LORazepam (ATIVAN) 0.5 MG tablet Take one tablet by mouth twice daily as needed Patient taking differently: Take 0.5 mg by mouth 2 (two) times daily as needed (agitation).  05/23/19  Yes Mast, Man X, NP  metoprolol tartrate (LOPRESSOR) 25 MG tablet Take 12.5 mg by mouth 2 (two) times daily. Morning: 7am-10am Evening: 7pm-10pm.   Yes [provider]  mirtazapine (REMERON) 15 MG tablet Take 7.5 mg by mouth at bedtime. 7pm-10pm.   Yes [provider]  nystatin (NYSTATIN) powder Apply 1 application topically daily as needed (  under left breast). 100,000unit Under left breast   Yes [provider]  nystatin cream (MYCOSTATIN) Apply 1 application topically See admin instructions. Mix with triamcinolone cream and apply to reddened area on back and buttocks with each continent episode and prn, then cover with zinc oxide barrier cream.   Yes [provider]  omeprazole (PRILOSEC) 10 MG capsule Take 10 mg by mouth daily. 7am-10am.   Yes [provider]  polyethylene glycol (MIRALAX / GLYCOLAX) packet Take 17 g by mouth daily. 7am-10am.   Yes [provider]  risperiDONE (RISPERDAL) 0.25 MG tablet Take 0.25 mg by mouth every evening. 7pm-10pm   Yes [provider]  Rivaroxaban (XARELTO) 15 MG TABS tablet Take 15 mg by mouth every evening. 4pm-6pm.   Yes [provider]  sertraline (ZOLOFT) 100 MG  tablet Take 100 mg by mouth at bedtime. 7pm-10pm. Give 190m with 242mto =12543m/13/20  Yes [provider]  sertraline (ZOLOFT) 25 MG tablet Take 25 mg by mouth at bedtime. 7pm-10pm. Give 66m70mong with 100mg43m=166mg.31m3/20  Yes [provider]  simethicone (MYLICON) 80 MG chewable tablet Chew 80 mg by mouth 3 (three) times daily with meals.   Yes [provider]  triamcinolone cream (KENALOG) 0.5 % Apply 1 application topically See admin instructions. TID and PRN. Mix with Nystatin cream and apply to reddened areas on back and buttocks with each incontinent episode and prn, then cover with zinc oxide barrier cream   Yes [provider]    Current Facility-Administered Medications  Medication Dose Route Frequency Provider Last Rate Last Admin  . acetaminophen (TYLENOL) tablet 650 mg  650 mg Oral Q6H PRN Shalhoub, GeorgeSherryll Burger     Or  . acetaminophen (TYLENOL) suppository 650 mg  650 mg Rectal Q6H PRN Shalhoub, GeorgeSherryll Burger    . dextrose 5 %-0.45 % sodium chloride infusion   Intravenous Continuous Amin, Damita Lack5 mL/hr at 06/21/19 0533 New Bag at 06/21/19 0533  . enoxaparin (LOVENOX) injection 60 mg  1 mg/kg (Adjusted) Subcutaneous BID Bell, Leodis SiasH      . ipratropium-albuterol (DUONEB) 0.5-2.5 (3) MG/3ML nebulizer solution 3 mL  3 mL Nebulization Q4H PRN Amin, Ankit Chirag, MD      . levETIRAcetam (KEPPRA) 250 mg in sodium chloride 0.9 % 100 mL IVPB  250 mg Intravenous Q12H Amin, Ankit Chirag, MD 410 mL/hr at 06/21/19 1039 250 mg at 06/21/19 1039  . loratadine (CLARITIN) tablet 10 mg  10 mg Oral Daily Shalhoub, GeorgeSherryll Burger 10 mg at 06/21/19 1046  . LORazepam (ATIVAN) injection 0.5 mg  0.5 mg Intravenous BID PRN Denny,Lang Snow  0.5 mg at 06/21/19 0044  . metoprolol tartrate (LOPRESSOR) tablet 12.5 mg  12.5 mg Oral BID ShalhoVernelle Emerald 12.5 mg at 06/21/19 1046  . mirtazapine (REMERON) tablet 7.5 mg  7.5 mg Oral QHS  Shalhoub, GeorgeSherryll Burger    . ondansetron (ZOFRABanner [facility]aritan Medical Centeret 4 mg  4 mg Oral Q6H PRN Shalhoub, GeorgeSherryll Burger     Or  . ondansetron (ZOFRAFindlay Surgery Centerction 4 mg  4 mg Intravenous Q6H PRN Shalhoub, GeorgeSherryll Burger    . pantoprazole (PROTONIX) EC tablet 40 mg  40 mg Oral Daily Shalhoub, GeorgeSherryll Burger 40 mg at 06/21/19 1046  . piperacillin-tazobactam (ZOSYN) IVPB 3.375 g  3.375 g Intravenous  Q8H Shalhoub, Sherryll Burger, MD 12.5 mL/hr at 06/21/19 0523 3.375 g at 06/21/19 0523  . polyethylene glycol (MIRALAX / GLYCOLAX) packet 17 g  17 g Oral Daily PRN Amin, Ankit Chirag, MD      . risperiDONE (RISPERDAL) tablet 0.25 mg  0.25 mg Oral QPM Shalhoub, Sherryll Burger, MD      . senna-docusate (Senokot-S) tablet 2 tablet  2 tablet Oral QHS PRN Amin, Ankit Chirag, MD      . sertraline (ZOLOFT) tablet 125 mg  125 mg Oral QHS Amin, Ankit Chirag, MD      . simethicone (MYLICON) chewable tablet 80 mg  80 mg Oral TID WC Vernelle Emerald, MD   80 mg at 06/21/19 1046    Allergies as of 06/19/2019 - Review Complete 06/19/2019  Allergen Reaction Noted  . Hydrocodone Nausea And Vomiting 03/24/2012  . Morphine and related Nausea And Vomiting 03/24/2012  . Actonel [risedronate sodium] Other (See Comments) 03/24/2012  . Darvon [propoxyphene hcl] Other (See Comments) 03/24/2012  . Oxycodone hcl Other (See Comments) 03/24/2012  . Pneumovax [pneumococcal polysaccharide vaccine] Other (See Comments) 03/24/2012    History reviewed. No pertinent family history.  Social History   Socioeconomic History  . Marital status: Widowed    Spouse name: Not on file  . Number of children: Not on file  . Years of education: Not on file  . Highest education level: Not on file  Occupational History  . Not on file  Tobacco Use  . Smoking status: Never Smoker  . Smokeless tobacco: Never Used  Substance and Sexual Activity  . Alcohol use: No  . Drug use: No  . Sexual activity: Never  Other Topics Concern  . Not on file  Social History  Narrative   [facility] [place] since 04/25/2013   Widowed   Never smoked   Alcohol none   DNR, POA, Living Will   Social Determinants of Health   Financial Resource Strain:   . Difficulty of Paying Living Expenses:   Food Insecurity:   . Worried About Charity fundraiser in the Last Year:   . Arboriculturist in the Last Year:   Transportation Needs:   . Film/video editor (Medical):   Marland Kitchen Lack of Transportation (Non-Medical):   Physical Activity:   . Days of Exercise per Week:   . Minutes of Exercise per Session:   Stress:   . Feeling of Stress :   Social Connections:   . Frequency of Communication with Friends and Family:   . Frequency of Social Gatherings with Friends and Family:   . Attends Religious Services:   . Active Member of Clubs or Organizations:   . Attends Archivist Meetings:   Marland Kitchen Marital Status:   Intimate Partner Violence:   . Fear of Current or Ex-Partner:   . Emotionally Abused:   Marland Kitchen Physically Abused:   . Sexually Abused:     Review of Systems: Limited review of systems due to patient with dementia. Daughter providing the patient's history. See HPI.  Physical Exam: Vital signs in last 24 hours: Temp:  [97.5 F (36.4 C)-98.7 F (37.1 C)] 97.5 F (36.4 C) (06/15 1208) Pulse Rate:  [79-98] 79 (06/15 1208) Resp:  [17-30] 17 (06/15 1208) BP: (105-152)/(59-87) 146/77 (06/15 1208) SpO2:  [91 %-98 %] 95 % (06/15 1208) Last BM Date: 06/20/19 General: Ill-appearing 86 year old female no acute distress. Head:  Normocephalic and atraumatic. Eyes:  No scleral icterus.  Conjunctiva pink. Ears:  Normal auditory acuity. Nose:  No deformity, discharge or lesions. Mouth: Scattered missing dentition. No ulcers or lesions.  Neck:  Supple. No lymphadenopathy or thyromegaly.  Lungs: Coarse breath sounds anteriorly, diminished in the bases. Heart: Regular rate and rhythm, no murmurs. Abdomen: Protuberant, not tense but somewhat firm.  Nontender. No obvious HSM. Positive bowel sounds to all 4 quadrants. Right lower quadrant and central mid lower abdominal scars intact. Rectal: Deferred. Musculoskeletal:  Symmetrical without gross deformities.  Pulses:  Normal pulses noted. Extremities:  1+ pitting edema LEs bilaterally.  Neurologic:  Alert and  oriented x4. No focal deficits.  Skin:  Intact without significant lesions or rashes. Psych:  Alert and cooperative. Normal mood and affect.  Intake/Output from previous day: 06/14 0701 - 06/15 0700 In: 1228.9 [I.V.:1106.7; IV Piggyback:122.2] Out: -  Intake/Output this shift: Total I/O In: -  Out: 150 [Urine:150]  Lab Results: Recent Labs    06/20/19 0555 06/20/19 0658 06/21/19 0441  WBC SPECIMEN CLOTTED 14.4* 9.3  HGB SPECIMEN CLOTTED 12.9 11.6*  HCT SPECIMEN CLOTTED 42.3 37.0  PLT SPECIMEN CLOTTED 210 167   BMET Recent Labs    06/19/19 2200 06/20/19 0555 06/21/19 0441  NA 147* 143 144  K 3.1* 4.0 3.6  CL 111 114* 114*  CO2 20* 18* 20*  GLUCOSE 159* 158* 93  BUN '14 10 11  ' CREATININE 1.28* 1.06* 1.01*  CALCIUM 8.4* 7.0* 7.2*   LFT Recent Labs    06/19/19 2208 06/20/19 0555 06/21/19 0441  PROT 6.3*   < > 5.5*  ALBUMIN 3.4*   < > 2.9*  AST 234*   < > 123*  ALT 152*   < > 132*  ALKPHOS 144*   < > 102  BILITOT 2.1*   < > 2.9*  BILIDIR 1.3*  --   --   IBILI 0.8  --   --    < > = values in this interval not displayed.   PT/INR Recent Labs    06/20/19 0555 06/20/19 0658  LABPROT SPECIMEN CLOTTED 14.8  INR SPECIMEN CLOTTED 1.2   Hepatitis Panel Recent Labs    06/19/19 2200  HEPBSAG NON REACTIVE  HCVAB NON REACTIVE  HEPAIGM NON REACTIVE  HEPBIGM NON REACTIVE      Studies/Results: NM Hepatobiliary Liver Func  Result Date: 06/20/2019 CLINICAL DATA:  Concern for cholecystitis on CT exam. Negative ultrasound exam. No biliary duct dilatation on CT exam. EXAM: NUCLEAR MEDICINE HEPATOBILIARY IMAGING TECHNIQUE: Sequential images of the  abdomen were obtained out to 60 minutes following intravenous administration of radiopharmaceutical. RADIOPHARMACEUTICALS:  5.4 mCi Tc-67m Choletec IV COMPARISON:  None. FINDINGS: Mild delay in radiotracer clearance from the blood pool. There is uniform uptake within liver. The common bile duct is not identified over a 2 hour exam. No activity in the bowel. The gallbladder subsequently does not fill. IMPRESSION: No excretion of radiotracer into the bowel. Bile duct obstruction is not favored as the biliary tree with normal on comparison ultrasound and CT. Therefore favor hepatic dysfunction such as hepatitis. Electronically Signed   By: SSuzy BouchardM.D.   On: 06/20/2019 13:00   CT ABDOMEN PELVIS W CONTRAST  Result Date: 06/20/2019 CLINICAL DATA:  Biliary colic. EXAM: CT ABDOMEN AND PELVIS WITH CONTRAST TECHNIQUE: Multidetector CT imaging of the abdomen and pelvis was performed using the standard protocol following bolus administration of intravenous contrast. CONTRAST:  852mOMNIPAQUE IOHEXOL 300 MG/ML  SOLN COMPARISON:  July 13, 2016 FINDINGS: Lower chest:  Significant bibasilar airspace disease is noted favored to represent a combination of both atelectasis and consolidation, especially at the right lung base.The heart is enlarged. Hepatobiliary: There is decreased hepatic attenuation suggestive of hepatic steatosis. There appears to be some gallbladder wall thickening and adjacent inflammatory changes. There appears to be pericholecystic free fluid.There is no biliary ductal dilation. Pancreas: Normal contours without ductal dilatation. No peripancreatic fluid collection. Spleen: Unremarkable. Adrenals/Urinary Tract: --Adrenal glands: Unremarkable. --Right kidney/ureter: No hydronephrosis or radiopaque kidney stones. --Left kidney/ureter: There is an at least partially duplicated left-sided collecting system. There is a nonobstructing stone measuring approximately 6 mm in the distal left ureter, nearly at  the left UVJ (axial series 2, image 73). --Urinary bladder: Unremarkable. Stomach/Bowel: --Stomach/Duodenum: No hiatal hernia or other gastric abnormality. Normal duodenal course and caliber. --Small bowel: Unremarkable. --Colon: There is a very large amount of stool the level the rectum. There is scattered colonic diverticula without CT evidence for diverticulitis. --Appendix: Not visualized. No right lower quadrant inflammation or free fluid. Vascular/Lymphatic: Atherosclerotic calcification is present within the non-aneurysmal abdominal aorta, without hemodynamically significant stenosis. --No retroperitoneal lymphadenopathy. --No mesenteric lymphadenopathy. --No pelvic or inguinal lymphadenopathy. Reproductive: Status post hysterectomy. No adnexal mass. Other: No ascites or free air. The abdominal wall is normal. Musculoskeletal. Again noted is a compression fracture of the T9 vertebral body. There has been significant interval height loss since the patient's prior study dated 07/25/2018. There is no new acute appearing compression fracture. IMPRESSION: 1. Findings are concerning for acute cholecystitis. As this is discordant with the patient's recent ultrasound, correlation with laboratory studies and HIDA scan is recommended. 2. There is a large amount of stool the level of the rectum. 3. There is a nonobstructing stone measuring approximately 6 mm in the distal left ureter, nearly at the left UVJ. 4. Significant bibasilar airspace disease favored to represent a combination of both atelectasis and consolidation, especially at the right lung base. 5. Hepatic steatosis. 6. Cardiomegaly. 7. Again noted is a T9 compression fracture with significant interval height loss since 2020. Aortic Atherosclerosis (ICD10-I70.0). Electronically Signed   By: Constance Holster M.D.   On: 06/20/2019 00:26   DG Chest Port 1 View  Result Date: 06/19/2019 CLINICAL DATA:  Shortness of breath and unresponsiveness EXAM: PORTABLE  CHEST 1 VIEW COMPARISON:  07/25/2018 FINDINGS: Cardiac shadow is enlarged but stable. Aortic calcifications are again seen. Overall inspiratory effort is poor however no focal infiltrate is seen. Mild central vascular prominence is noted. No bony abnormality is noted. IMPRESSION: Mild central vascular congestion.  No focal infiltrate is noted. Electronically Signed   By: Inez Catalina M.D.   On: 06/19/2019 22:22   ECHOCARDIOGRAM COMPLETE  Result Date: 06/20/2019    ECHOCARDIOGRAM REPORT   Patient Name:   ARADHYA SHELLENBARGER Date of Exam: 06/20/2019 Medical Rec #:  [id number]     Height:       63.0 in Accession #:    [id number]    Weight:       154.3 lb Date of Birth:  01-18-1927     BSA:          1.732 m Patient Age:    45 years      BP:           149/60 mmHg Patient Gender: F             HR:           93 bpm. Exam Location:  Inpatient Procedure: 2D Echo, Cardiac Doppler  and Color Doppler Indications:    CHF-Acute Diastolic 010.07 / H21.97  History:        Patient has no prior history of Echocardiogram examinations.                 Stroke; Risk Factors:Hypertension and Non-Smoker. Sepsis. GERD.  Sonographer:    Vickie Epley RDCS Referring Phys: [id number] [place]  Sonographer Comments: Image acquisition challenging due to respiratory motion. IMPRESSIONS  1. Left ventricular ejection fraction, by estimation, is 60 to 65%. The left ventricle has normal function. The left ventricle has no regional wall motion abnormalities. There is mild left ventricular hypertrophy. Left ventricular diastolic parameters are indeterminate.  2. Right ventricular systolic function is normal. The right ventricular size is normal. There is normal pulmonary artery systolic pressure. The estimated right ventricular systolic pressure is 98.2 mmHg.  3. The mitral valve is normal in structure. No evidence of mitral valve regurgitation.  4. The aortic valve was not well visualized. Aortic valve regurgitation is not visualized. No aortic  stenosis is present.  5. The inferior vena cava is normal in size with greater than 50% respiratory variability, suggesting right atrial pressure of 3 mmHg. FINDINGS  Left Ventricle: Left ventricular ejection fraction, by estimation, is 60 to 65%. The left ventricle has normal function. The left ventricle has no regional wall motion abnormalities. The left ventricular internal cavity size was small. There is mild left ventricular hypertrophy. Left ventricular diastolic parameters are indeterminate. Right Ventricle: The right ventricular size is normal. Right vetricular wall thickness was not assessed. Right ventricular systolic function is normal. There is normal pulmonary artery systolic pressure. The tricuspid regurgitant velocity is 2.37 m/s, and with an assumed right atrial pressure of 3 mmHg, the estimated right ventricular systolic pressure is 64.1 mmHg. Left Atrium: Left atrial size was normal in size. Right Atrium: Right atrial size was normal in size. Pericardium: Trivial pericardial effusion is present. Mitral Valve: The mitral valve is normal in structure. No evidence of mitral valve regurgitation. Tricuspid Valve: The tricuspid valve is normal in structure. Tricuspid valve regurgitation is trivial. Aortic Valve: The aortic valve was not well visualized. Aortic valve regurgitation is not visualized. No aortic stenosis is present. Pulmonic Valve: The pulmonic valve was not well visualized. Pulmonic valve regurgitation is trivial. Aorta: The aortic root and ascending aorta are structurally normal, with no evidence of dilitation. Venous: The inferior vena cava is normal in size with greater than 50% respiratory variability, suggesting right atrial pressure of 3 mmHg. IAS/Shunts: The interatrial septum was not well visualized.  LEFT VENTRICLE PLAX 2D LVIDd:         3.10 cm LVIDs:         2.00 cm LV PW:         0.90 cm LV IVS:        0.90 cm LVOT diam:     1.70 cm LV SV:         48 LV SV Index:   28 LVOT Area:      2.27 cm  RIGHT VENTRICLE TAPSE (M-mode): 2.1 cm LEFT ATRIUM             Index       RIGHT ATRIUM           Index LA diam:        3.40 cm 1.96 cm/m  RA Area:     11.60 cm LA Vol (A2C):   26.1 ml 15.07 ml/m RA Volume:  18.80 ml  10.86 ml/m LA Vol (A4C):   23.0 ml 13.28 ml/m LA Biplane Vol: 26.3 ml 15.19 ml/m  AORTIC VALVE LVOT Vmax:   109.00 cm/s LVOT Vmean:  77.800 cm/s LVOT VTI:    0.213 m  AORTA Ao Root diam: 2.90 cm TRICUSPID VALVE TR Peak grad:   22.5 mmHg TR Vmax:        237.00 cm/s  SHUNTS Systemic VTI:  0.21 m Systemic Diam: 1.70 cm Oswaldo Milian MD Electronically signed by Oswaldo Milian MD Signature Date/Time: 06/20/2019/7:41:35 PM    Final    US Abdomen Limited RUQ  Result Date: 06/19/2019 CLINICAL DATA:  Transaminitis EXAM: ULTRASOUND ABDOMEN LIMITED RIGHT UPPER QUADRANT COMPARISON:  None. FINDINGS: Gallbladder: No gallstones or wall thickening visualized. No sonographic Murphy sign noted by sonographer. Common bile duct: Diameter: Normal caliber, 6 mm. Liver: Increased echotexture compatible with fatty infiltration. No focal abnormality or biliary ductal dilatation. Portal vein is patent on color Doppler imaging with normal direction of blood flow towards the liver. Other: None. IMPRESSION: Fatty infiltration of the liver. No acute findings. Electronically Signed   By: Rolm Baptise M.D.   On: 06/19/2019 23:29    IMPRESSION/PLAN:  84. 86 year old female with elevated T.  Bili and LFTs, etiology unclear. Possibly due to acute cholecystitis verses infection/sepsis.  RUQ sono showed hepatic steatosis without evidence of acute cholecystitis. CTAP findings concerning for acute cholecystitis without evidence of biliary obstruction.    HIDA scan favored hepatic dysfunction/hepatitis.  -Repeat Hepatic panel in am  -Continue Zosyn 3.37gm IV Q 8 hrs -Request copy of most recent LFTs from SNF -Defer further hepatology evaluation recommendations to [place]   2. Sepsis. Urine  cultures +  for Klebsiella pneumonia and Enterococcus Faecium.  Blood cultures + Enterobacteriacea and E. Coli.  -Continue antibiotics, management per the hospitalist   3. History of CVA, Dementia   4. Normocytic anemia, dilutional component. Hg 12.9 -> 11.6.    Patrecia Pour Kennedy-Smith  06/21/2019, 12:54 PM

## 2019-06-21 NOTE — Progress Notes (Signed)
PHARMACY - PHYSICIAN COMMUNICATION CRITICAL VALUE ALERT - BLOOD CULTURE IDENTIFICATION (BCID)  Erica [name] is an 86 y.o. female who presented to [facility] on 06/19/2019 with a chief complaint of N/V/Fever/hypoxia Assessment: IAI, acute cholecystitis vs aspiration PNA  Name of physician (or Provider) ContactedReesa Chew via [place]  Current antibiotics: Zosyn  Changes to prescribed antibiotics recommended:  Patient is on recommended antibiotics - No changes needed  Results for orders placed or performed during the hospital encounter of 06/19/19  Blood Culture ID Panel (Reflexed) (Collected: 06/19/2019  9:51 PM)  Result Value Ref Range   Enterococcus species NOT DETECTED NOT DETECTED   Listeria monocytogenes NOT DETECTED NOT DETECTED   Staphylococcus species NOT DETECTED NOT DETECTED   Staphylococcus aureus (BCID) NOT DETECTED NOT DETECTED   Streptococcus species NOT DETECTED NOT DETECTED   Streptococcus agalactiae NOT DETECTED NOT DETECTED   Streptococcus pneumoniae NOT DETECTED NOT DETECTED   Streptococcus pyogenes NOT DETECTED NOT DETECTED   Acinetobacter baumannii NOT DETECTED NOT DETECTED   Enterobacteriaceae species DETECTED (A) NOT DETECTED   Enterobacter cloacae complex NOT DETECTED NOT DETECTED   Escherichia coli DETECTED (A) NOT DETECTED   Klebsiella oxytoca NOT DETECTED NOT DETECTED   Klebsiella pneumoniae NOT DETECTED NOT DETECTED   Proteus species NOT DETECTED NOT DETECTED   Serratia marcescens NOT DETECTED NOT DETECTED   Carbapenem resistance NOT DETECTED NOT DETECTED   Haemophilus influenzae NOT DETECTED NOT DETECTED   Neisseria meningitidis NOT DETECTED NOT DETECTED   Pseudomonas aeruginosa NOT DETECTED NOT DETECTED   Candida albicans NOT DETECTED NOT DETECTED   Candida glabrata NOT DETECTED NOT DETECTED   Candida krusei NOT DETECTED NOT DETECTED   Candida parapsilosis NOT DETECTED NOT DETECTED   Candida tropicalis NOT DETECTED NOT DETECTED   Eudelia Bunch,  Pharm.D 06/21/2019 7:29 AM

## 2019-06-21 NOTE — Progress Notes (Signed)
Speech Language Pathology Treatment: Dysphagia  Patient Details Name: Erica [name] MRN: [id number] DOB: [birth year] Today's Date: 06/21/2019 Time: 4098-1191 SLP Time Calculation (min) (ACUTE ONLY): 36 min  Assessment / Plan / Recommendation Clinical Impression  Treatment session focusing on pt and daughter education regarding pt's swallowing function and other risk factors for aspiration pneumonias.  Pt with increased wheeze after minimal intake and marginally congested coughing concerning for aspiration.  Reviewed with pt and daughter risk factors that increase asp pna risk including pt's RR 30 and some accessory muscle use, reliance on others for feeding premorbid x months, dementia, progressive decline, and deconditioning including decreased strength of cough and lack of mobility.  Considering said factors and daughter's stated goal of pt recovering (not comfort only)- daughter's wishes, recommend NPO x single ice chips and medications with puree and tsp nectar and follow up next date to determine if ready/able to participate in MBS.  Hopeful for improved medical/mental and respiratory status.  MBS potentially may elucidate source of aspiration/dysphagia.   SLP will follow up 6/16 for MBS readiness with MD, surgery and GI approval.  SLP advised daughter [name] and pt to aspiration mitigation strategies including pt fully upright for all intake, if coughing encouraging her to cough to clear and cease intake.  Demonstrated use of oral suction to daughter.    SLP will message MD to seek approval for plan - RN, daughter and pt agreeable to plan.  SLP would recommend to consider a palliative consult given pt's multicomorbidities, current medical/respiratory status and progressive decline at [place] [name].    HPI HPI: 86 yo female adm to [facility] with acute cholecystitis vs aspiration PNA.  Pt PMH + for cva 2013 at left occipital and parietal lobes with dysphagia, vision deficits.  Since stroke,  pt has been in some sort of rehab place - staying in [place] and then in [facility].  She has reportedly had a functional decline over the last few months - requiring someone to feed her over the last month per her daughter.  Pt with liver dysfunction per labs, HIDDA scan inconclusive and at this time plan for possible biliary drain on hold.  Imaging 06/20/2019 Showing significant bibasilar airspace disease favored to represent combination of both atelectasis and consolidation especially at the right lung base.    There is question if pt may have aspirated from vomiting prior to admission per MD.  Daughter [name] reports she fed her mother one month ago and though her intake was poor she tolerated without coughing.RR at baseline today is 30 with accessory muscle use noted.  Pt currently presents with congested cough - that is not productive.  Daughter [name] reports pt's breathing is not at baseline level.      SLP Plan  MBS (hopefully 06/22/2019)       Recommendations  Diet recommendations: NPO (ice chips, tsp nectar) Liquids provided via: Teaspoon Medication Administration: Whole meds with puree Supervision: Staff to assist with self feeding Compensations: Slow rate;Small sips/bites;Other (Comment) (oral suction prn) Postural Changes and/or Swallow Maneuvers: Seated upright 90 degrees;Upright 30-60 min after meal                Oral Care Recommendations: Oral care QID SLP Visit Diagnosis: Dysphagia, oropharyngeal phase (R13.12) Plan: MBS (hopefully 06/22/2019)       GO                [name] 06/21/2019, 12:24 PM   Kathleen Lime, MS [facility] SLP Acute Rehab  Services Office [phone]

## 2019-06-21 NOTE — Progress Notes (Signed)
PROGRESS NOTE    Erica [name]  EXB:[id number] DOB: 1927-01-15 DOA: 06/19/2019 PCP: Mast, Man X, NP   Brief Narrative:   -year-old with history of vascular dementia, previous DVT on Xarelto, hemorrhagic CVA with residual right-sided weakness, HTN, seizure disorder, GERD sent from skilled nursing facility for evaluation of lethargy and vomiting.  Initially noted to be febrile tachycardic with elevated lactic acidosis.  Transaminitis but ultrasound was negative for acute cholecystitis.  Initial CMP showed acute cholecystitis with bibasilar pneumonia.  Started on Zosyn.  Blood cultures grew E. coli, HIDA scan was a poor study   Assessment & Plan:   Principal Problem:   Severe sepsis with acute organ dysfunction due to Gram negative bacteria (HCC) Active Problems:   Essential hypertension   GERD without esophagitis   Vascular dementia with behavior disturbance (HCC)   Chronic anticoagulation   Goals of care, counseling/discussion   Seizure disorder (HCC)   Acute cholecystitis   Left ureteral stone   Lactic acidosis   Gram-negative sepsis with organ dysfunction (HCC)   Aspiration pneumonia of both lower lobes due to gastric secretions (HCC)  Sepsis, unknown exact etiology intra-abdominal versus aspiration pneumonia -CT showed acute cholecystitis but right upper quadrant ultrasound is not consistent with this. -HIDA scan-poor study with incomplete intake. -General surgery-recommend medical management and if necessary IR guided percutaneous drain -Follow-up culture data.  Continue Zosyn, tailor as appropriate -Start Lovenox 1mg /kg every 12 hours.  Xarelto on hold  Aspiration pneumonia versus pneumonitis -Bronchodilators as needed.  Supplemental oxygen -Continue IV Zosyn -Speech and swallow evaluation-pending  GERD without esophagitis -PPI  Left-sided nonobstructive renal stone -Hydration, supportive care. 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  Will use Lovenox instead for now  Seizure disorder -On Keppra   DVT prophylaxis: Lovenox Code Status: DNR Family Communication: Daughter at bedside  Status is: Inpatient  Remains inpatient appropriate because:Hemodynamically unstable   Dispo: The patient is from: SNF              Anticipated d/c is to: SNF              Anticipated d/c date is: 2-3 days              Patient currently is not medically stable to d/c.  Maintain hospital stay for IV antibiotic therapy.  Currently awaiting culture data, ongoing evaluation for speech therapy   Subjective: Patient is awake this morning answering basic questions.  Does not have any new complaints, she is working with speech therapy  Review of Systems Otherwise negative except as per HPI, including: General: Denies fever, chills, night sweats or unintended weight loss. Resp: Denies cough, wheezing, shortness of breath. Cardiac: Denies chest pain, palpitations, orthopnea, paroxysmal nocturnal dyspnea. GI: Denies abdominal pain, nausea, vomiting, diarrhea or constipation GU: Denies dysuria, frequency, hesitancy or incontinence MS: Denies muscle aches, joint pain or swelling Neuro: Denies headache, neurologic deficits (focal weakness, numbness, tingling), abnormal gait Psych: Denies anxiety, depression, SI/HI/AVH Skin: Denies new rashes or lesions ID: Denies sick contacts, exotic exposures, travel  Examination:  Constitutional: Not in acute distress Respiratory: Mild expiratory wheezing Cardiovascular: Normal sinus rhythm, no rubs Abdomen: Nontender nondistended good bowel sounds Musculoskeletal: No edema noted Skin: No rashes seen Neurologic: CN 2-12 grossly intact.  And nonfocal Psychiatric: Alert to name, poor judgment and  insight  Objective: Vitals:   06/20/19 1722 06/20/19 2129 06/21/19 0023 06/21/19 0534  BP: (!) 128/59 105/87 122/62 (!) 152/68  Pulse: 92 82 91 87  Resp: (!) 24  20 20   Temp: 98.7 F (37.1 C) 98.6 F (37 C) 97.8 F (36.6 C) 97.7 F (36.5 C)  TempSrc: Oral Oral Oral Oral  SpO2: 94% 98% 91% 93%  Weight:      Height:        Intake/Output Summary (Last 24 hours) at 06/21/2019 1112 Last data filed at 06/21/2019 0911 Gross per 24 hour  Intake 1228.94 ml  Output 150 ml  Net 1078.94 ml   Filed Weights   06/19/19 2158  Weight: 70 kg     Data Reviewed:   CBC: Recent Labs  Lab 06/19/19 2200 06/20/19 0555 06/20/19 0658 06/21/19 0441  WBC 7.7 SPECIMEN CLOTTED 14.4* 9.3  NEUTROABS 7.0 SPECIMEN CLOTTED 12.9*  --   HGB 15.2* SPECIMEN CLOTTED 12.9 11.6*  HCT 48.4* SPECIMEN CLOTTED 42.3 37.0  MCV 96.2 SPECIMEN CLOTTED 100.7* 95.4  PLT 222 SPECIMEN CLOTTED 210 315   Basic Metabolic Panel: Recent Labs  Lab 06/19/19 2200 06/20/19 0555 06/21/19 0441  NA 147* 143 144  K 3.1* 4.0 3.6  CL 111 114* 114*  CO2 20* 18* 20*  GLUCOSE 159* 158* 93  BUN 14 10 11   CREATININE 1.28* 1.06* 1.01*  CALCIUM 8.4* 7.0* 7.2*  MG  --   --  1.8   GFR: Estimated Creatinine Clearance: 35.4 mL/min (A) (by C-G formula based on SCr of 1.01 mg/dL (H)). Liver Function Tests: Recent Labs  Lab 06/19/19 2200 06/19/19 2208 06/20/19 0555 06/21/19 0441  AST 231* 234* 234* 123*  ALT 157* 152* 175* 132*  ALKPHOS 152* 144* 110 102  BILITOT 2.3* 2.1* 2.3* 2.9*  PROT 6.4* 6.3* 5.2* 5.5*  ALBUMIN 3.6 3.4* 2.8* 2.9*   No results for input(s): LIPASE, AMYLASE in the last 168 hours. Recent Labs  Lab 06/20/19 1423  AMMONIA 32   Coagulation Profile: Recent Labs  Lab 06/19/19 2200 06/20/19 0555 06/20/19 0658  INR 1.3* SPECIMEN CLOTTED 1.2   Cardiac Enzymes: No results for input(s): CKTOTAL, CKMB, CKMBINDEX, TROPONINI in the last 168 hours. BNP (last 3 results) No results for input(s): PROBNP  in the last 8760 hours. HbA1C: No results for input(s): HGBA1C in the last 72 hours. CBG: Recent Labs  Lab 06/20/19 1410 06/20/19 1718 06/21/19 0021 06/21/19 0529  GLUCAP 88 101* 102* 82   Lipid Profile: No results for input(s): CHOL, HDL, LDLCALC, TRIG, CHOLHDL, LDLDIRECT in the last 72 hours. Thyroid Function Tests: No results for input(s): TSH, T4TOTAL, FREET4, T3FREE, THYROIDAB in the last 72 hours. Anemia Panel: No results for input(s): VITAMINB12, FOLATE, FERRITIN, TIBC, IRON, RETICCTPCT in the last 72 hours. Sepsis Labs: Recent Labs  Lab 06/19/19 2200 06/20/19 0000 06/20/19 0555 06/21/19 0441  PROCALCITON  --   --  100.01 64.14  LATICACIDVEN 5.5* 5.3* 4.8*  --     Recent Results (from the past 240 hour(s))  Blood Culture (routine x 2)     Status: None (Preliminary result)   Collection Time: 06/19/19  9:51 PM   Specimen: BLOOD RIGHT HAND  Result Value Ref Range Status   Specimen Description   Final    BLOOD RIGHT HAND Performed at [facility] [address], [place], [place] [address]    Special Requests   Final    BOTTLES DRAWN AEROBIC AND ANAEROBIC Blood Culture adequate volume  Performed at [facility], [place] [address], [place], [place] [address]    Culture  Setup Time   Final    AEROBIC BOTTLE ONLY GRAM NEGATIVE RODS CRITICAL RESULT CALLED TO, READ BACK BY AND VERIFIED WITH: PHARMD M HICKS [id number] AT 40 AM BY CM    Culture   Final    NO GROWTH 2 DAYS Performed at [facility], [address], [place], [place] [address]    Report Status PENDING  Incomplete  Blood Culture ID Panel (Reflexed)     Status: Abnormal   Collection Time: 06/19/19  9:51 PM  Result Value Ref Range Status   Enterococcus species NOT DETECTED NOT DETECTED Final   Listeria monocytogenes NOT DETECTED NOT DETECTED Final   Staphylococcus species NOT DETECTED NOT DETECTED Final   Staphylococcus aureus (BCID) NOT DETECTED NOT DETECTED Final    Streptococcus species NOT DETECTED NOT DETECTED Final   Streptococcus agalactiae NOT DETECTED NOT DETECTED Final   Streptococcus pneumoniae NOT DETECTED NOT DETECTED Final   Streptococcus pyogenes NOT DETECTED NOT DETECTED Final   Acinetobacter baumannii NOT DETECTED NOT DETECTED Final   Enterobacteriaceae species DETECTED (A) NOT DETECTED Final    Comment: Enterobacteriaceae represent a large family of gram-negative bacteria, not a single organism. CRITICAL RESULT CALLED TO, READ BACK BY AND VERIFIED WITH: PHARMD M HICKS [id number] AT 74 AM BY CM    Enterobacter cloacae complex NOT DETECTED NOT DETECTED Final   Escherichia coli DETECTED (A) NOT DETECTED Final    Comment: CRITICAL RESULT CALLED TO, READ BACK BY AND VERIFIED WITH: PHARMD M HICKS [id number] AR 86 AM BY CM    Klebsiella oxytoca NOT DETECTED NOT DETECTED Final   Klebsiella pneumoniae NOT DETECTED NOT DETECTED Final   Proteus species NOT DETECTED NOT DETECTED Final   Serratia marcescens NOT DETECTED NOT DETECTED Final   Carbapenem resistance NOT DETECTED NOT DETECTED Final   Haemophilus influenzae NOT DETECTED NOT DETECTED Final   Neisseria meningitidis NOT DETECTED NOT DETECTED Final   Pseudomonas aeruginosa NOT DETECTED NOT DETECTED Final   Candida albicans NOT DETECTED NOT DETECTED Final   Candida glabrata NOT DETECTED NOT DETECTED Final   Candida krusei NOT DETECTED NOT DETECTED Final   Candida parapsilosis NOT DETECTED NOT DETECTED Final   Candida tropicalis NOT DETECTED NOT DETECTED Final    Comment: Performed at [facility], [place]. [address] [place], [place] [address]  Blood Culture (routine x 2)     Status: None (Preliminary result)   Collection Time: 06/19/19  9:55 PM   Specimen: BLOOD LEFT HAND  Result Value Ref Range Status   Specimen Description   Final    BLOOD LEFT HAND Performed at [facility] [address], [place], [place] [address]    Special Requests   Final    BOTTLES  DRAWN AEROBIC AND ANAEROBIC Blood Culture adequate volume Performed at [facility] [address], [place], [place] [address]    Culture   Final    NO GROWTH 2 DAYS Performed at [facility] [address], [place], [place] [address]    Report Status PENDING  Incomplete  Urine culture     Status: Abnormal (Preliminary result)   Collection Time: 06/19/19 10:00 PM   Specimen: In/Out Cath Urine  Result Value Ref Range Status   Specimen Description   Final    IN/OUT CATH URINE Performed at [facility] [address] [place],  [place] [address]    Special Requests   Final    NONE Performed at [facility], [place] [address], [place], [place] [address]    Culture (A)  Final    60,000 COLONIES/mL KLEBSIELLA PNEUMONIAE 40,000 COLONIES/mL ENTEROCOCCUS FAECIUM SUSCEPTIBILITIES TO FOLLOW Performed at [facility], [place] [address], [place], [place] [address]    Report Status PENDING  Incomplete  SARS Coronavirus 2 by RT PCR (hospital order, performed in [facility] hospital lab) Nasopharyngeal Nasopharyngeal Swab     Status: None   Collection Time: 06/19/19 10:30 PM   Specimen: Nasopharyngeal Swab  Result Value Ref Range Status   SARS Coronavirus 2 NEGATIVE NEGATIVE Final    Comment: (NOTE) SARS-CoV-2 target nucleic acids are NOT DETECTED.  The SARS-CoV-2 RNA is generally detectable in upper and lower respiratory specimens during the acute phase of infection. The lowest concentration of SARS-CoV-2 viral copies this assay can detect is 250 copies / mL. A negative result does not preclude SARS-CoV-2 infection and should not be used as the sole basis for treatment or other patient management decisions.  A negative result may occur with improper specimen collection / handling, submission of specimen other than nasopharyngeal swab, presence of viral mutation(s) within the areas targeted by this assay, and inadequate  number of viral copies (<250 copies / mL). A negative result must be combined with clinical observations, patient history, and epidemiological information.  Fact Sheet for Patients:   StrictlyIdeas.no  Fact Sheet for Healthcare Providers: BankingDealers.co.za  This test is not yet approved or  cleared by the [place] FDA and has been authorized for detection and/or diagnosis of SARS-CoV-2 by FDA under an Emergency Use Authorization (EUA).  This EUA will remain in effect (meaning this test can be used) for the duration of the COVID-19 declaration under Section 564(b)(1) of the Act, 21 U.S.C. section 360bbb-3(b)(1), unless the authorization is terminated or revoked sooner.  Performed at [facility], [place] [address], [place], [place] [address]   MRSA PCR Screening     Status: None   Collection Time: 06/20/19  6:38 PM   Specimen: Nasal Mucosa; Nasopharyngeal  Result Value Ref Range Status   MRSA by PCR NEGATIVE NEGATIVE Final    Comment:        The GeneXpert MRSA Assay (FDA approved for NASAL specimens only), is one component of a comprehensive MRSA colonization surveillance program. It is not intended to diagnose MRSA infection nor to guide or monitor treatment for MRSA infections. Performed at [facility], [place] [address] [place], [place] [address]          Radiology Studies: NM Hepatobiliary Liver Func  Result Date: 06/20/2019 CLINICAL DATA:  Concern for cholecystitis on CT exam. Negative ultrasound exam. No biliary duct dilatation on CT exam. EXAM: NUCLEAR MEDICINE HEPATOBILIARY IMAGING TECHNIQUE: Sequential images of the abdomen were obtained out to 60 minutes following intravenous administration of radiopharmaceutical. RADIOPHARMACEUTICALS:  5.4 mCi Tc-59m  Choletec IV COMPARISON:  None. FINDINGS: Mild delay in radiotracer clearance from the blood pool. There is uniform uptake  within liver. The common bile duct is not identified over a 2 hour exam. No activity in the bowel. The gallbladder subsequently does not fill. IMPRESSION: No excretion of radiotracer into the bowel. Bile duct obstruction is not favored as the biliary tree with normal on comparison ultrasound and CT. Therefore favor hepatic dysfunction such as hepatitis. Electronically Signed   By: Suzy Bouchard M.D.   On:  06/20/2019 13:00   CT ABDOMEN PELVIS W CONTRAST  Result Date: 06/20/2019 CLINICAL DATA:  Biliary colic. EXAM: CT ABDOMEN AND PELVIS WITH CONTRAST TECHNIQUE: Multidetector CT imaging of the abdomen and pelvis was performed using the standard protocol following bolus administration of intravenous contrast. CONTRAST:  81mL OMNIPAQUE IOHEXOL 300 MG/ML  SOLN COMPARISON:  July 13, 2016 FINDINGS: Lower chest: Significant bibasilar airspace disease is noted favored to represent a combination of both atelectasis and consolidation, especially at the right lung base.The heart is enlarged. Hepatobiliary: There is decreased hepatic attenuation suggestive of hepatic steatosis. There appears to be some gallbladder wall thickening and adjacent inflammatory changes. There appears to be pericholecystic free fluid.There is no biliary ductal dilation. Pancreas: Normal contours without ductal dilatation. No peripancreatic fluid collection. Spleen: Unremarkable. Adrenals/Urinary Tract: --Adrenal glands: Unremarkable. --Right kidney/ureter: No hydronephrosis or radiopaque kidney stones. --Left kidney/ureter: There is an at least partially duplicated left-sided collecting system. There is a nonobstructing stone measuring approximately 6 mm in the distal left ureter, nearly at the left UVJ (axial series 2, image 73). --Urinary bladder: Unremarkable. Stomach/Bowel: --Stomach/Duodenum: No hiatal hernia or other gastric abnormality. Normal duodenal course and caliber. --Small bowel: Unremarkable. --Colon: There is a very large amount of  stool the level the rectum. There is scattered colonic diverticula without CT evidence for diverticulitis. --Appendix: Not visualized. No right lower quadrant inflammation or free fluid. Vascular/Lymphatic: Atherosclerotic calcification is present within the non-aneurysmal abdominal aorta, without hemodynamically significant stenosis. --No retroperitoneal lymphadenopathy. --No mesenteric lymphadenopathy. --No pelvic or inguinal lymphadenopathy. Reproductive: Status post hysterectomy. No adnexal mass. Other: No ascites or free air. The abdominal wall is normal. Musculoskeletal. Again noted is a compression fracture of the T9 vertebral body. There has been significant interval height loss since the patient's prior study dated 07/25/2018. There is no new acute appearing compression fracture. IMPRESSION: 1. Findings are concerning for acute cholecystitis. As this is discordant with the patient's recent ultrasound, correlation with laboratory studies and HIDA scan is recommended. 2. There is a large amount of stool the level of the rectum. 3. There is a nonobstructing stone measuring approximately 6 mm in the distal left ureter, nearly at the left UVJ. 4. Significant bibasilar airspace disease favored to represent a combination of both atelectasis and consolidation, especially at the right lung base. 5. Hepatic steatosis. 6. Cardiomegaly. 7. Again noted is a T9 compression fracture with significant interval height loss since 2020. Aortic Atherosclerosis (ICD10-I70.0). Electronically Signed   By: Constance Holster M.D.   On: 06/20/2019 00:26   DG Chest Port 1 View  Result Date: 06/19/2019 CLINICAL DATA:  Shortness of breath and unresponsiveness EXAM: PORTABLE CHEST 1 VIEW COMPARISON:  07/25/2018 FINDINGS: Cardiac shadow is enlarged but stable. Aortic calcifications are again seen. Overall inspiratory effort is poor however no focal infiltrate is seen. Mild central vascular prominence is noted. No bony abnormality is  noted. IMPRESSION: Mild central vascular congestion.  No focal infiltrate is noted. Electronically Signed   By: Inez Catalina M.D.   On: 06/19/2019 22:22   ECHOCARDIOGRAM COMPLETE  Result Date: 06/20/2019    ECHOCARDIOGRAM REPORT   Patient Name:   Erica [name] Date of Exam: 06/20/2019 Medical Rec #:  [id number]     Height:       63.0 in Accession #:    [id number]    Weight:       154.3 lb Date of Birth:  1927/06/20     BSA:          1.732  m Patient Age:    26 years      BP:           149/60 mmHg Patient Gender: F             HR:           93 bpm. Exam Location:  Inpatient Procedure: 2D Echo, Cardiac Doppler and Color Doppler Indications:    CHF-Acute Diastolic 712.45 / Y09.98  History:        Patient has no prior history of Echocardiogram examinations.                 Stroke; Risk Factors:Hypertension and Non-Smoker. Sepsis. GERD.  Sonographer:    Vickie Epley RDCS Referring Phys: [id number] [place]  Sonographer Comments: Image acquisition challenging due to respiratory motion. IMPRESSIONS  1. Left ventricular ejection fraction, by estimation, is 60 to 65%. The left ventricle has normal function. The left ventricle has no regional wall motion abnormalities. There is mild left ventricular hypertrophy. Left ventricular diastolic parameters are indeterminate.  2. Right ventricular systolic function is normal. The right ventricular size is normal. There is normal pulmonary artery systolic pressure. The estimated right ventricular systolic pressure is 39.7 mmHg.  3. The mitral valve is normal in structure. No evidence of mitral valve regurgitation.  4. The aortic valve was not well visualized. Aortic valve regurgitation is not visualized. No aortic stenosis is present.  5. The inferior vena cava is normal in size with greater than 50% respiratory variability, suggesting right atrial pressure of 3 mmHg. FINDINGS  Left Ventricle: Left ventricular ejection fraction, by estimation, is 60 to 65%. The left ventricle  has normal function. The left ventricle has no regional wall motion abnormalities. The left ventricular internal cavity size was small. There is mild left ventricular hypertrophy. Left ventricular diastolic parameters are indeterminate. Right Ventricle: The right ventricular size is normal. Right vetricular wall thickness was not assessed. Right ventricular systolic function is normal. There is normal pulmonary artery systolic pressure. The tricuspid regurgitant velocity is 2.37 m/s, and with an assumed right atrial pressure of 3 mmHg, the estimated right ventricular systolic pressure is 67.3 mmHg. Left Atrium: Left atrial size was normal in size. Right Atrium: Right atrial size was normal in size. Pericardium: Trivial pericardial effusion is present. Mitral Valve: The mitral valve is normal in structure. No evidence of mitral valve regurgitation. Tricuspid Valve: The tricuspid valve is normal in structure. Tricuspid valve regurgitation is trivial. Aortic Valve: The aortic valve was not well visualized. Aortic valve regurgitation is not visualized. No aortic stenosis is present. Pulmonic Valve: The pulmonic valve was not well visualized. Pulmonic valve regurgitation is trivial. Aorta: The aortic root and ascending aorta are structurally normal, with no evidence of dilitation. Venous: The inferior vena cava is normal in size with greater than 50% respiratory variability, suggesting right atrial pressure of 3 mmHg. IAS/Shunts: The interatrial septum was not well visualized.  LEFT VENTRICLE PLAX 2D LVIDd:         3.10 cm LVIDs:         2.00 cm LV PW:         0.90 cm LV IVS:        0.90 cm LVOT diam:     1.70 cm LV SV:         48 LV SV Index:   28 LVOT Area:     2.27 cm  RIGHT VENTRICLE TAPSE (M-mode): 2.1 cm LEFT ATRIUM  Index       RIGHT ATRIUM           Index LA diam:        3.40 cm 1.96 cm/m  RA Area:     11.60 cm LA Vol (A2C):   26.1 ml 15.07 ml/m RA Volume:   18.80 ml  10.86 ml/m LA Vol (A4C):    23.0 ml 13.28 ml/m LA Biplane Vol: 26.3 ml 15.19 ml/m  AORTIC VALVE LVOT Vmax:   109.00 cm/s LVOT Vmean:  77.800 cm/s LVOT VTI:    0.213 m  AORTA Ao Root diam: 2.90 cm TRICUSPID VALVE TR Peak grad:   22.5 mmHg TR Vmax:        237.00 cm/s  SHUNTS Systemic VTI:  0.21 m Systemic Diam: 1.70 cm Oswaldo Milian MD Electronically signed by Oswaldo Milian MD Signature Date/Time: 06/20/2019/7:41:35 PM    Final    US Abdomen Limited RUQ  Result Date: 06/19/2019 CLINICAL DATA:  Transaminitis EXAM: ULTRASOUND ABDOMEN LIMITED RIGHT UPPER QUADRANT COMPARISON:  None. FINDINGS: Gallbladder: No gallstones or wall thickening visualized. No sonographic Murphy sign noted by sonographer. Common bile duct: Diameter: Normal caliber, 6 mm. Liver: Increased echotexture compatible with fatty infiltration. No focal abnormality or biliary ductal dilatation. Portal vein is patent on color Doppler imaging with normal direction of blood flow towards the liver. Other: None. IMPRESSION: Fatty infiltration of the liver. No acute findings. Electronically Signed   By: Rolm Baptise M.D.   On: 06/19/2019 23:29        Scheduled Meds: . loratadine  10 mg Oral Daily  . metoprolol tartrate  12.5 mg Oral BID  . mirtazapine  7.5 mg Oral QHS  . pantoprazole  40 mg Oral Daily  . risperiDONE  0.25 mg Oral QPM  . sertraline  125 mg Oral QHS  . simethicone  80 mg Oral TID WC   Continuous Infusions: . dextrose 5 % and 0.45% NaCl 75 mL/hr at 06/21/19 0533  . levETIRAcetam 250 mg (06/21/19 1039)  . piperacillin-tazobactam (ZOSYN)  IV 3.375 g (06/21/19 0523)     LOS: 1 day   Time spent= 35 mins    Xavier Munger Arsenio Loader, MD [facility]  If 7PM-7AM, please contact night-coverage  06/21/2019, 11:12 AM

## 2019-06-21 NOTE — Progress Notes (Signed)
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 Hydrocodone Nausea And Vomiting  . Morphine And Related Nausea And Vomiting  . Actonel [Risedronate Sodium] Other (See Comments)    unknown  . Darvon [Propoxyphene Hcl] Other (See Comments)    unknown  . Oxycodone Hcl Other (See Comments)    unknown  . Pneumovax [Pneumococcal Polysaccharide Vaccine] Other (See Comments)    unknown    Patient Measurements: Height: 5\' 3"  (160 cm) Weight: 70 kg (154 lb 5.2 oz) IBW/kg (Calculated) : 52.4 Heparin Dosing Weight:   Vital Signs: Temp: 97.7 F (36.5 C) (06/15 0534) Temp Source: Oral (06/15 0534) BP: 152/68 (06/15 0534) Pulse Rate: 87 (06/15 0534)  Labs: Recent Labs    06/19/19 2200 06/19/19 2200 06/20/19 0555 06/20/19 0555 06/20/19 0658 06/21/19 0441  HGB 15.2*   < > SPECIMEN CLOTTED   < > 12.9 11.6*  HCT 48.4*   < > SPECIMEN CLOTTED  --  42.3 37.0  PLT 222   < > SPECIMEN CLOTTED  --  210 167  APTT 27  --  SPECIMEN CLOTTED  --  28  --   LABPROT 15.3*  --  SPECIMEN CLOTTED  --  14.8  --   INR 1.3*  --  SPECIMEN CLOTTED  --  1.2  --   HEPARINUNFRC  --   --   --   --  0.13*  --   CREATININE 1.28*  --  1.06*  --   --  1.01*   < > = values in this interval not displayed.    Estimated Creatinine Clearance: 35.4 mL/min (A) (by C-G formula based on SCr of 1.01 mg/dL (H)).   Medical History: Past Medical History:  Diagnosis Date  . Acute encephalopathy   . Aphasia following unspecified cerebrovascular disease   . 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 Depression   . Dysphagia following cerebrovascular disease   . Hepatic steatosis   . Hydroureteronephrosis   . Hypertension   . Insomnia   . Macular degeneration   . Major depressive disorder, recurrent ([place])   . Neuralgia and neuritis, unspecified ([place])   . Nontraumatic  intracranial hemorrhage ([place])   . Stroke ([place])   . 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   . 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 acetaminophen (TYLENOL) 325 MG tablet Take 650 mg by mouth every 4 (four) hours as needed for mild pain.    12/2018  . bisacodyl (DULCOLAX) 10 MG suppository Place 10 mg rectally daily as needed for mild constipation or moderate constipation.   unk  . hydrocortisone 2.5 % lotion Apply 1 application topically 2 (two) times daily as needed (face).    06/03/2019  . levETIRAcetam (KEPPRA) 100 MG/ML solution Take 250 mg by mouth 2 (two) times daily. 250 mg = 2.5 ML; oral Twice A Day  Morning: 7am-10am Evening: 7pm-10pm   06/18/2019 at Unknown time  . loratadine (CLARITIN) 10 MG tablet Take 10 mg by mouth daily. 7am-10am.   06/19/2019 at Unknown time  . LORazepam (ATIVAN) 0.5 MG tablet Take one tablet by mouth twice daily as needed (Patient taking differently: Take 0.5 mg by mouth 2 (two) times  daily as needed (agitation). ) 60 tablet 0 06/18/2019  . metoprolol tartrate (LOPRESSOR) 25 MG tablet Take 12.5 mg by mouth 2 (two) times daily. Morning: 7am-10am Evening: 7pm-10pm.   06/19/2019 at 0855  . mirtazapine (REMERON) 15 MG tablet Take 7.5 mg by mouth at bedtime. 7pm-10pm.   06/18/2019  . nystatin (NYSTATIN) powder Apply 1 application topically daily as needed (under left breast). 100,000unit Under left breast   04/12/2019  . nystatin cream (MYCOSTATIN) Apply 1 application topically See admin instructions. Mix with triamcinolone cream and apply to reddened area on back and buttocks with each continent episode and prn, then cover with zinc oxide barrier cream.   06/19/2019 at Unknown time  . omeprazole (PRILOSEC) 10 MG capsule Take 10 mg by mouth daily. 7am-10am.   06/19/2019 at Unknown time  . polyethylene glycol (MIRALAX / GLYCOLAX)  packet Take 17 g by mouth daily. 7am-10am.   06/19/2019 at Unknown time  . risperiDONE (RISPERDAL) 0.25 MG tablet Take 0.25 mg by mouth every evening. 7pm-10pm   06/18/2019  . Rivaroxaban (XARELTO) 15 MG TABS tablet Take 15 mg by mouth every evening. 4pm-6pm.   06/18/2019 at ~1700  . sertraline (ZOLOFT) 100 MG tablet Take 100 mg by mouth at bedtime. 7pm-10pm. Give 100mg  with 25mg  to =125mg    06/18/2019  . sertraline (ZOLOFT) 25 MG tablet Take 25 mg by mouth at bedtime. 7pm-10pm. Give 25mg  along with 100mg  to =125mg .   06/18/2019  . simethicone (MYLICON) 80 MG chewable tablet Chew 80 mg by mouth 3 (three) times daily with meals.   06/18/2019  . triamcinolone cream (KENALOG) 0.5 % Apply 1 application topically See admin instructions. TID and PRN. Mix with Nystatin cream and apply to reddened areas on back and buttocks with each incontinent episode and prn, then cover with zinc oxide barrier cream   06/05/2019    Assessment: 86 yo F on Xarelto 15 qsupper PTA for hx DVT with protein C & S deficiency.  Xarelto on hold for possible invasive procedure.  Last dose xarelto 6/12 @ 1700.  Hg 11.6 PLTC WNL.  No bleeding reported.  SCr 1.01, CrCl ~ 35 ml/min  Goal of Therapy:  Monitor platelets by anticoagulation protocol: Yes   Plan:  Lovenox 1 mg/kg SQ q12 - used adjusted body weight instead of total body weight for advanced age, marginal CrCl  LMWH 60 q12 F/u when able to resume PTA Xarelto 15 qsupper  Eudelia Bunch, Pharm.D 06/21/2019 11:30 AM

## 2019-06-21 NOTE — Progress Notes (Signed)
Patient ID: Erica [name], female   DOB: 1927-05-03, 86 y.o.   MRN: [id number]       Subjective: Patient working for her breathing.  She is working with speech currently for swallow eval.  Unsure whether she has any abdominal pain.  No nausea or vomiting.  Daughter at bedside  ROS: unable due to dementia  Objective: Vital signs in last 24 hours: Temp:  [97.7 F (36.5 C)-98.7 F (37.1 C)] 97.7 F (36.5 C) (06/15 0534) Pulse Rate:  [82-98] 87 (06/15 0534) Resp:  [20-30] 20 (06/15 0534) BP: (105-152)/(59-87) 152/68 (06/15 0534) SpO2:  [91 %-98 %] 93 % (06/15 0534) Last BM Date: 06/20/19  Intake/Output from previous day: 06/14 0701 - 06/15 0700 In: 1228.9 [I.V.:1106.7; IV Piggyback:122.2] Out: -  Intake/Output this shift: Total I/O In: -  Out: 150 [Urine:150]  PE: Heart: regular Lungs: relatively clear, but using abdominal muscles to breath Abd: soft, she is unsure if she is tender in RUQ, doesn't really seem to guard or wince with palpation.  What guarding she does is from her belly breathing.  Lab Results:  Recent Labs    06/20/19 0658 06/21/19 0441  WBC 14.4* 9.3  HGB 12.9 11.6*  HCT 42.3 37.0  PLT 210 167   BMET Recent Labs    06/20/19 0555 06/21/19 0441  NA 143 144  K 4.0 3.6  CL 114* 114*  CO2 18* 20*  GLUCOSE 158* 93  BUN 10 11  CREATININE 1.06* 1.01*  CALCIUM 7.0* 7.2*   PT/INR Recent Labs    06/20/19 0555 06/20/19 0658  LABPROT SPECIMEN CLOTTED 14.8  INR SPECIMEN CLOTTED 1.2   CMP     Component Value Date/Time   NA 144 06/21/2019 0441   NA 141 08/03/2018 0000   K 3.6 06/21/2019 0441   CL 114 (H) 06/21/2019 0441   CL 108 08/03/2018 0000   CO2 20 (L) 06/21/2019 0441   CO2 23 08/03/2018 0000   GLUCOSE 93 06/21/2019 0441   BUN 11 06/21/2019 0441   BUN 16 08/03/2018 0000   CREATININE 1.01 (H) 06/21/2019 0441   CALCIUM 7.2 (L) 06/21/2019 0441   CALCIUM 8.7 08/03/2018 0000   PROT 5.5 (L) 06/21/2019 0441   PROT 6.0 07/14/2017 0000    ALBUMIN 2.9 (L) 06/21/2019 0441   ALBUMIN 4.0 07/14/2017 0000   AST 123 (H) 06/21/2019 0441   ALT 132 (H) 06/21/2019 0441   ALKPHOS 102 06/21/2019 0441   BILITOT 2.9 (H) 06/21/2019 0441   GFRNONAA 49 (L) 06/21/2019 0441   GFRNONAA 47 08/03/2018 0000   GFRAA 57 (L) 06/21/2019 0441   Lipase     Component Value Date/Time   LIPASE 34 07/25/2018 1032       Studies/Results: NM Hepatobiliary Liver Func  Result Date: 06/20/2019 CLINICAL DATA:  Concern for cholecystitis on CT exam. Negative ultrasound exam. No biliary duct dilatation on CT exam. EXAM: NUCLEAR MEDICINE HEPATOBILIARY IMAGING TECHNIQUE: Sequential images of the abdomen were obtained out to 60 minutes following intravenous administration of radiopharmaceutical. RADIOPHARMACEUTICALS:  5.4 mCi Tc-39m  Choletec IV COMPARISON:  None. FINDINGS: Mild delay in radiotracer clearance from the blood pool. There is uniform uptake within liver. The common bile duct is not identified over a 2 hour exam. No activity in the bowel. The gallbladder subsequently does not fill. IMPRESSION: No excretion of radiotracer into the bowel. Bile duct obstruction is not favored as the biliary tree with normal on comparison ultrasound and CT. Therefore favor hepatic dysfunction such as  hepatitis. Electronically Signed   By: Suzy Bouchard M.D.   On: 06/20/2019 13:00   CT ABDOMEN PELVIS W CONTRAST  Result Date: 06/20/2019 CLINICAL DATA:  Biliary colic. EXAM: CT ABDOMEN AND PELVIS WITH CONTRAST TECHNIQUE: Multidetector CT imaging of the abdomen and pelvis was performed using the standard protocol following bolus administration of intravenous contrast. CONTRAST:  65mL OMNIPAQUE IOHEXOL 300 MG/ML  SOLN COMPARISON:  July 13, 2016 FINDINGS: Lower chest: Significant bibasilar airspace disease is noted favored to represent a combination of both atelectasis and consolidation, especially at the right lung base.The heart is enlarged. Hepatobiliary: There is decreased hepatic  attenuation suggestive of hepatic steatosis. There appears to be some gallbladder wall thickening and adjacent inflammatory changes. There appears to be pericholecystic free fluid.There is no biliary ductal dilation. Pancreas: Normal contours without ductal dilatation. No peripancreatic fluid collection. Spleen: Unremarkable. Adrenals/Urinary Tract: --Adrenal glands: Unremarkable. --Right kidney/ureter: No hydronephrosis or radiopaque kidney stones. --Left kidney/ureter: There is an at least partially duplicated left-sided collecting system. There is a nonobstructing stone measuring approximately 6 mm in the distal left ureter, nearly at the left UVJ (axial series 2, image 73). --Urinary bladder: Unremarkable. Stomach/Bowel: --Stomach/Duodenum: No hiatal hernia or other gastric abnormality. Normal duodenal course and caliber. --Small bowel: Unremarkable. --Colon: There is a very large amount of stool the level the rectum. There is scattered colonic diverticula without CT evidence for diverticulitis. --Appendix: Not visualized. No right lower quadrant inflammation or free fluid. Vascular/Lymphatic: Atherosclerotic calcification is present within the non-aneurysmal abdominal aorta, without hemodynamically significant stenosis. --No retroperitoneal lymphadenopathy. --No mesenteric lymphadenopathy. --No pelvic or inguinal lymphadenopathy. Reproductive: Status post hysterectomy. No adnexal mass. Other: No ascites or free air. The abdominal wall is normal. Musculoskeletal. Again noted is a compression fracture of the T9 vertebral body. There has been significant interval height loss since the patient's prior study dated 07/25/2018. There is no new acute appearing compression fracture. IMPRESSION: 1. Findings are concerning for acute cholecystitis. As this is discordant with the patient's recent ultrasound, correlation with laboratory studies and HIDA scan is recommended. 2. There is a large amount of stool the level of  the rectum. 3. There is a nonobstructing stone measuring approximately 6 mm in the distal left ureter, nearly at the left UVJ. 4. Significant bibasilar airspace disease favored to represent a combination of both atelectasis and consolidation, especially at the right lung base. 5. Hepatic steatosis. 6. Cardiomegaly. 7. Again noted is a T9 compression fracture with significant interval height loss since 2020. Aortic Atherosclerosis (ICD10-I70.0). Electronically Signed   By: Constance Holster M.D.   On: 06/20/2019 00:26   DG Chest Port 1 View  Result Date: 06/19/2019 CLINICAL DATA:  Shortness of breath and unresponsiveness EXAM: PORTABLE CHEST 1 VIEW COMPARISON:  07/25/2018 FINDINGS: Cardiac shadow is enlarged but stable. Aortic calcifications are again seen. Overall inspiratory effort is poor however no focal infiltrate is seen. Mild central vascular prominence is noted. No bony abnormality is noted. IMPRESSION: Mild central vascular congestion.  No focal infiltrate is noted. Electronically Signed   By: Inez Catalina M.D.   On: 06/19/2019 22:22   ECHOCARDIOGRAM COMPLETE  Result Date: 06/20/2019    ECHOCARDIOGRAM REPORT   Patient Name:   JAID QUIRION Date of Exam: 06/20/2019 Medical Rec #:  [id number]     Height:       63.0 in Accession #:    [id number]    Weight:       154.3 lb Date of Birth:  [birth year]  BSA:          1.732 m Patient Age:    81 years      BP:           149/60 mmHg Patient Gender: F             HR:           93 bpm. Exam Location:  Inpatient Procedure: 2D Echo, Cardiac Doppler and Color Doppler Indications:    CHF-Acute Diastolic 092.33 / A07.62  History:        Patient has no prior history of Echocardiogram examinations.                 Stroke; Risk Factors:Hypertension and Non-Smoker. Sepsis. GERD.  Sonographer:    Vickie Epley RDCS Referring Phys: [id number] [place]  Sonographer Comments: Image acquisition challenging due to respiratory motion. IMPRESSIONS  1. Left ventricular  ejection fraction, by estimation, is 60 to 65%. The left ventricle has normal function. The left ventricle has no regional wall motion abnormalities. There is mild left ventricular hypertrophy. Left ventricular diastolic parameters are indeterminate.  2. Right ventricular systolic function is normal. The right ventricular size is normal. There is normal pulmonary artery systolic pressure. The estimated right ventricular systolic pressure is 56.2 mmHg.  3. The mitral valve is normal in structure. No evidence of mitral valve regurgitation.  4. The aortic valve was not well visualized. Aortic valve regurgitation is not visualized. No aortic stenosis is present.  5. The inferior vena cava is normal in size with greater than 50% respiratory variability, suggesting right atrial pressure of 3 mmHg. FINDINGS  Left Ventricle: Left ventricular ejection fraction, by estimation, is 60 to 65%. The left ventricle has normal function. The left ventricle has no regional wall motion abnormalities. The left ventricular internal cavity size was small. There is mild left ventricular hypertrophy. Left ventricular diastolic parameters are indeterminate. Right Ventricle: The right ventricular size is normal. Right vetricular wall thickness was not assessed. Right ventricular systolic function is normal. There is normal pulmonary artery systolic pressure. The tricuspid regurgitant velocity is 2.37 m/s, and with an assumed right atrial pressure of 3 mmHg, the estimated right ventricular systolic pressure is 56.3 mmHg. Left Atrium: Left atrial size was normal in size. Right Atrium: Right atrial size was normal in size. Pericardium: Trivial pericardial effusion is present. Mitral Valve: The mitral valve is normal in structure. No evidence of mitral valve regurgitation. Tricuspid Valve: The tricuspid valve is normal in structure. Tricuspid valve regurgitation is trivial. Aortic Valve: The aortic valve was not well visualized. Aortic valve  regurgitation is not visualized. No aortic stenosis is present. Pulmonic Valve: The pulmonic valve was not well visualized. Pulmonic valve regurgitation is trivial. Aorta: The aortic root and ascending aorta are structurally normal, with no evidence of dilitation. Venous: The inferior vena cava is normal in size with greater than 50% respiratory variability, suggesting right atrial pressure of 3 mmHg. IAS/Shunts: The interatrial septum was not well visualized.  LEFT VENTRICLE PLAX 2D LVIDd:         3.10 cm LVIDs:         2.00 cm LV PW:         0.90 cm LV IVS:        0.90 cm LVOT diam:     1.70 cm LV SV:         48 LV SV Index:   28 LVOT Area:     2.27 cm  RIGHT  VENTRICLE TAPSE (M-mode): 2.1 cm LEFT ATRIUM             Index       RIGHT ATRIUM           Index LA diam:        3.40 cm 1.96 cm/m  RA Area:     11.60 cm LA Vol (A2C):   26.1 ml 15.07 ml/m RA Volume:   18.80 ml  10.86 ml/m LA Vol (A4C):   23.0 ml 13.28 ml/m LA Biplane Vol: 26.3 ml 15.19 ml/m  AORTIC VALVE LVOT Vmax:   109.00 cm/s LVOT Vmean:  77.800 cm/s LVOT VTI:    0.213 m  AORTA Ao Root diam: 2.90 cm TRICUSPID VALVE TR Peak grad:   22.5 mmHg TR Vmax:        237.00 cm/s  SHUNTS Systemic VTI:  0.21 m Systemic Diam: 1.70 cm Oswaldo Milian MD Electronically signed by Oswaldo Milian MD Signature Date/Time: 06/20/2019/7:41:35 PM    Final    US Abdomen Limited RUQ  Result Date: 06/19/2019 CLINICAL DATA:  Transaminitis EXAM: ULTRASOUND ABDOMEN LIMITED RIGHT UPPER QUADRANT COMPARISON:  None. FINDINGS: Gallbladder: No gallstones or wall thickening visualized. No sonographic Murphy sign noted by sonographer. Common bile duct: Diameter: Normal caliber, 6 mm. Liver: Increased echotexture compatible with fatty infiltration. No focal abnormality or biliary ductal dilatation. Portal vein is patent on color Doppler imaging with normal direction of blood flow towards the liver. Other: None. IMPRESSION: Fatty infiltration of the liver. No acute  findings. Electronically Signed   By: Rolm Baptise M.D.   On: 06/19/2019 23:29    Anti-infectives: Anti-infectives (From admission, onward)   Start     Dose/Rate Route Frequency Ordered Stop   06/20/19 0600  piperacillin-tazobactam (ZOSYN) IVPB 3.375 g     Discontinue     3.375 g 12.5 mL/hr over 240 Minutes Intravenous Every 8 hours 06/20/19 0113     06/19/19 2245  piperacillin-tazobactam (ZOSYN) IVPB 3.375 g        3.375 g 100 mL/hr over 30 Minutes Intravenous  Once 06/19/19 2241 06/19/19 2341   06/19/19 2215  Ampicillin-Sulbactam (UNASYN) 3 g in sodium chloride 0.9 % 100 mL IVPB        3 g 200 mL/hr over 30 Minutes Intravenous  Once 06/19/19 2201 06/19/19 2239       Assessment/Plan Dementia H/o CVA HTN Dysphagia H/o DVT on xarelto - being held Seizure d/o GERD Bacteremia, E. Coli/Enterobacter - on zosyn Possible aspiration PNA  Elevated LFTs HIDA scan was unhelpful as the radiotracer did not excrete from the liver c/w hepatic dysfunction.  Her LFTs are still mildly elevated today.  This could be related to liver dysfunction, but could still be from her gallbladder.  Her exam is not impressive today.  She is AF, normal WBC.  Her US shows no gallstones.  I do not think at this time we need to consult IR for a perc chole drain.  I think we can try to treat her with abx alone for now and see how she does.  The daughter was concerned about her liver dysfunction.  We discussed her imaging showing hepatic steatosis and how this can cause some hepatic dysfunction.  Her hepatitis panel is negative.  She has asked for GI to give an opinion about her liver and make sure this isn't contributing to her symptoms.  She could have such dysfunction that she has stasis of the biliary tree causing her LFTs to rise  and for bacteria to form.  Either way, for now I think we can manage conservatively as the family is for "treat what we can, but aim for comfort ultimately."  They definitely do NOT want any  type of surgery, which I think is totally reasonable.  We will continue to follow.  I have discussed this case with GI who is going to see her as well.  FEN - per swallow study, NPO for now VTE - Lovenox, therapeutic ID - zosyn   LOS: 1 day    Henreitta Cea , [facility] 06/21/2019, 11:20 AM Please see Amion for pager number during day hours 7:00am-4:30pm or 7:00am -11:30am on weekends

## 2019-06-21 NOTE — Progress Notes (Signed)
Received request earlier in shift to ask [facility] to fax previous labs, CBC, CMET, Xrays for GI. SRP, RN

## 2019-06-22 ENCOUNTER — Inpatient Hospital Stay (HOSPITAL_COMMUNITY): Payer: Medicare PPO

## 2019-06-22 LAB — COMPREHENSIVE METABOLIC PANEL
ALT: 98 U/L — ABNORMAL HIGH (ref 0–44)
AST: 62 U/L — ABNORMAL HIGH (ref 15–41)
Albumin: 2.8 g/dL — ABNORMAL LOW (ref 3.5–5.0)
Alkaline Phosphatase: 116 U/L (ref 38–126)
Anion gap: 8 (ref 5–15)
BUN: 7 mg/dL — ABNORMAL LOW (ref 8–23)
CO2: 19 mmol/L — ABNORMAL LOW (ref 22–32)
Calcium: 7.4 mg/dL — ABNORMAL LOW (ref 8.9–10.3)
Chloride: 112 mmol/L — ABNORMAL HIGH (ref 98–111)
Creatinine, Ser: 0.82 mg/dL (ref 0.44–1.00)
GFR calc Af Amer: >60 mL/min (ref >60)
GFR calc non Af Amer: >60 mL/min (ref >60)
Glucose, Bld: 104 mg/dL — ABNORMAL HIGH (ref 70–99)
Potassium: 2.8 mmol/L — ABNORMAL LOW (ref 3.5–5.1)
Sodium: 139 mmol/L (ref 135–145)
Total Bilirubin: 1.8 mg/dL — ABNORMAL HIGH (ref 0.3–1.2)
Total Protein: 5.5 g/dL — ABNORMAL LOW (ref 6.5–8.1)

## 2019-06-22 LAB — CBC
HCT: 35.9 % — ABNORMAL LOW (ref 36.0–46.0)
Hemoglobin: 11.4 g/dL — ABNORMAL LOW (ref 12.0–15.0)
MCH: 29.8 pg (ref 26.0–34.0)
MCHC: 31.8 g/dL (ref 30.0–36.0)
MCV: 93.7 fL (ref 80.0–100.0)
Platelets: 163 10*3/uL (ref 150–400)
RBC: 3.83 MIL/uL — ABNORMAL LOW (ref 3.87–5.11)
RDW: 15.6 % — ABNORMAL HIGH (ref 11.5–15.5)
WBC: 8.4 10*3/uL (ref 4.0–10.5)
nRBC: 0 % (ref 0.0–0.2)

## 2019-06-22 LAB — GLUCOSE, CAPILLARY
Glucose-Capillary: 103 mg/dL — ABNORMAL HIGH (ref 70–99)
Glucose-Capillary: 105 mg/dL — ABNORMAL HIGH (ref 70–99)
Glucose-Capillary: 85 mg/dL (ref 70–99)
Glucose-Capillary: 92 mg/dL (ref 70–99)
Glucose-Capillary: 99 mg/dL (ref 70–99)

## 2019-06-22 LAB — BRAIN NATRIURETIC PEPTIDE: B Natriuretic Peptide: 338.4 pg/mL — ABNORMAL HIGH (ref 0.0–100.0)

## 2019-06-22 LAB — URINE CULTURE: Culture: 60000 — AB

## 2019-06-22 LAB — MAGNESIUM: Magnesium: 1.8 mg/dL (ref 1.7–2.4)

## 2019-06-22 LAB — PROCALCITONIN: Procalcitonin: 37.66 ng/mL

## 2019-06-22 MED ORDER — SODIUM CHLORIDE 0.9 % IV SOLN
2.0000 g | INTRAVENOUS | Status: DC
  Administered 2019-06-22: 2 g via INTRAVENOUS
  Filled 2019-06-22: qty 20
  Filled 2019-06-22: qty 2

## 2019-06-22 MED ORDER — POTASSIUM CHLORIDE 10 MEQ/100ML IV SOLN
10.0000 meq | INTRAVENOUS | Status: AC
  Administered 2019-06-22 (×6): 10 meq via INTRAVENOUS
  Filled 2019-06-22 (×6): qty 100

## 2019-06-22 MED ORDER — DEXTROSE 10 % IV SOLN: INTRAVENOUS | Status: DC

## 2019-06-22 MED ORDER — IPRATROPIUM-ALBUTEROL 0.5-2.5 (3) MG/3ML IN SOLN
3.0000 mL | Freq: Three times a day (TID) | RESPIRATORY_TRACT | Status: DC
  Administered 2019-06-23 – 2019-06-24 (×3): 3 mL via RESPIRATORY_TRACT
  Filled 2019-06-22 (×4): qty 3

## 2019-06-22 MED ORDER — FUROSEMIDE 10 MG/ML IJ SOLN
40.0000 mg | Freq: Once | INTRAMUSCULAR | Status: AC
  Administered 2019-06-22: 40 mg via INTRAVENOUS
  Filled 2019-06-22: qty 4

## 2019-06-22 MED ORDER — IPRATROPIUM-ALBUTEROL 0.5-2.5 (3) MG/3ML IN SOLN
3.0000 mL | Freq: Three times a day (TID) | RESPIRATORY_TRACT | Status: DC
  Administered 2019-06-22: 3 mL via RESPIRATORY_TRACT

## 2019-06-22 NOTE — Progress Notes (Signed)
Pt more arousable, becomes agitated when repositioned, swinging arms and pushing against bed, telling writer and NT to "stop". RN and NT changed pt after small BM noted. VS remain stable. Lasix administered as ordered. SRP, RN

## 2019-06-22 NOTE — Plan of Care (Signed)
  Problem: Education: Goal: Knowledge of General Education information will improve Description Including pain rating scale, medication(s)/side effects and non-pharmacologic comfort measures Outcome: Progressing   Problem: Health Behavior/Discharge Planning: Goal: Ability to manage health-related needs will improve Outcome: Progressing   

## 2019-06-22 NOTE — Progress Notes (Signed)
PROGRESS NOTE    Erica [name]  ZDG:[id number] DOB: 02-Jul-1927 DOA: 06/19/2019 PCP: Mast, Man X, NP   Brief Narrative:   -year-old with history of vascular dementia, previous DVT on Xarelto, hemorrhagic CVA with residual right-sided weakness, HTN, seizure disorder, GERD sent from skilled nursing facility for evaluation of lethargy and vomiting.  Initially noted to be febrile tachycardic with elevated lactic acidosis.  Transaminitis but ultrasound was negative for acute cholecystitis.  Initial CMP showed acute cholecystitis with bibasilar pneumonia.  Started on Zosyn.  Blood cultures grew E. coli, HIDA scan was a poor study.  GI recommended conservative management.  Failed speech and swallow therapy-moderate aspiration risk, MBS.   Assessment & Plan:   Principal Problem:   Severe sepsis with acute organ dysfunction due to Gram negative bacteria (HCC) Active Problems:   Essential hypertension   GERD without esophagitis   Vascular dementia with behavior disturbance (HCC)   Chronic anticoagulation   Goals of care, counseling/discussion   Seizure disorder (HCC)   Acute cholecystitis   Left ureteral stone   Lactic acidosis   Gram-negative sepsis with organ dysfunction (HCC)   Aspiration pneumonia of both lower lobes due to gastric secretions (HCC)   Elevated AST (SGOT)   Elevated ALT measurement   Elevated bilirubin  Sepsis, unknown exact etiology intra-abdominal versus aspiration pneumonia Urinary tract infection-VRE, Klebsiella E. coli bacteremia -CT showed acute cholecystitis but right upper quadrant ultrasound is not consistent with this. -HIDA scan-poor study with incomplete intake. -Seen by general surgery and GI.  Conservative management. -I believe VRE is a colonizer therefore we will hold off on treating that.  We will transition patient to IV Rocephin. -Start Lovenox 1mg /kg every 12 hours.  Xarelto on hold  Aspiration pneumonia versus pneumonitis -Bronchodilators as  needed.  Supplemental oxygen -IV Rocephin -Speech and swallow evaluation-moderate aspiration risk.  MBS today.  GERD without esophagitis -PPI  Left-sided nonobstructive renal stone with urinary tract infection, growing Klebsiella -Hydration, supportive care.  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  Will use Lovenox instead for now  Seizure disorder -On Keppra   DVT prophylaxis: Lovenox Code Status: DNR Family Communication: None at bedside  Status is: Inpatient  Remains inpatient appropriate because:Hemodynamically unstable   Dispo: The patient is from: SNF              Anticipated d/c is to: SNF              Anticipated d/c date is: 2-3 days              Patient currently is not medically stable to d/c.  Maintain hospital stay for IV antibiotic therapy, oral intake is inconsistent.  Undergoing speech and swallow evaluation  Subjective: Patient is sleeping this morning, only responds to her name.  Review of Systems Otherwise negative except as per HPI, including: General: Denies fever, chills, night sweats or unintended weight loss. Resp: Denies cough, wheezing, shortness of breath. Cardiac: Denies chest pain, palpitations, orthopnea, paroxysmal nocturnal dyspnea. GI: Denies abdominal pain, nausea, vomiting, diarrhea or constipation GU: Denies dysuria, frequency, hesitancy or incontinence MS: Denies muscle aches, joint pain or swelling Neuro: Denies headache, neurologic deficits (focal weakness, numbness, tingling), abnormal gait Psych: Denies anxiety, depression, SI/HI/AVH Skin: Denies new rashes or lesions ID: Denies sick contacts, exotic exposures, travel  Examination:  Constitutional: Not in acute distress,  2 L nasal cannula Respiratory: Mildly rhonchorous breath sounds  anteriorly Cardiovascular: Normal sinus rhythm, no rubs Abdomen: Nontender nondistended good bowel sounds Musculoskeletal: No edema noted Skin: No rashes seen Neurologic: Grossly moving all the extremities Psychiatric: Poor judgment and insight, alert to her name only  Objective: Vitals:   06/21/19 1208 06/21/19 2057 06/21/19 2247 06/22/19 0543  BP: (!) 146/77 114/86 (!) 161/80 101/88  Pulse: 79 90 94 84  Resp: 17   (!) 22  Temp: (!) 97.5 F (36.4 C) 98.6 F (37 C)  98.3 F (36.8 C)  TempSrc: Axillary Axillary  Oral  SpO2: 95% 96%  97%  Weight:      Height:        Intake/Output Summary (Last 24 hours) at 06/22/2019 1042 Last data filed at 06/22/2019 0622 Gross per 24 hour  Intake 1740.96 ml  Output 1300 ml  Net 440.96 ml   Filed Weights   06/19/19 2158  Weight: 70 kg     Data Reviewed:   CBC: Recent Labs  Lab 06/19/19 2200 06/20/19 0555 06/20/19 0658 06/21/19 0441 06/22/19 0505  WBC 7.7 SPECIMEN CLOTTED 14.4* 9.3 8.4  NEUTROABS 7.0 SPECIMEN CLOTTED 12.9*  --   --   HGB 15.2* SPECIMEN CLOTTED 12.9 11.6* 11.4*  HCT 48.4* SPECIMEN CLOTTED 42.3 37.0 35.9*  MCV 96.2 SPECIMEN CLOTTED 100.7* 95.4 93.7  PLT 222 SPECIMEN CLOTTED 210 167 888   Basic Metabolic Panel: Recent Labs  Lab 06/19/19 2200 06/20/19 0555 06/21/19 0441 06/22/19 0505  NA 147* 143 144 139  K 3.1* 4.0 3.6 2.8*  CL 111 114* 114* 112*  CO2 20* 18* 20* 19*  GLUCOSE 159* 158* 93 104*  BUN 14 10 11  7*  CREATININE 1.28* 1.06* 1.01* 0.82  CALCIUM 8.4* 7.0* 7.2* 7.4*  MG  --   --  1.8 1.8   GFR: Estimated Creatinine Clearance: 43.6 mL/min (by C-G formula based on SCr of 0.82 mg/dL). Liver Function Tests: Recent Labs  Lab 06/19/19 2200 06/19/19 2208 06/20/19 0555 06/21/19 0441 06/22/19 0505  AST 231* 234* 234* 123* 62*  ALT 157* 152* 175* 132* 98*  ALKPHOS 152* 144* 110 102 116  BILITOT 2.3* 2.1* 2.3* 2.9* 1.8*  PROT 6.4* 6.3* 5.2* 5.5* 5.5*  ALBUMIN 3.6 3.4* 2.8* 2.9* 2.8*   No  results for input(s): LIPASE, AMYLASE in the last 168 hours. Recent Labs  Lab 06/20/19 1423  AMMONIA 32   Coagulation Profile: Recent Labs  Lab 06/19/19 2200 06/20/19 0555 06/20/19 0658  INR 1.3* SPECIMEN CLOTTED 1.2   Cardiac Enzymes: No results for input(s): CKTOTAL, CKMB, CKMBINDEX, TROPONINI in the last 168 hours. BNP (last 3 results) No results for input(s): PROBNP in the last 8760 hours. HbA1C: No results for input(s): HGBA1C in the last 72 hours. CBG: Recent Labs  Lab 06/21/19 0529 06/21/19 1213 06/21/19 1735 06/22/19 0011 06/22/19 0544  GLUCAP 82 94 93 105* 103*   Lipid Profile: No results for input(s): CHOL, HDL, LDLCALC, TRIG, CHOLHDL, LDLDIRECT in the last 72 hours. Thyroid Function Tests: No results for input(s): TSH, T4TOTAL, FREET4, T3FREE, THYROIDAB in the last 72 hours. Anemia Panel: No results for input(s): VITAMINB12, FOLATE, FERRITIN, TIBC, IRON, RETICCTPCT in the last 72 hours. Sepsis Labs: Recent Labs  Lab 06/19/19 2200 06/20/19 0000 06/20/19 0555 06/21/19 0441 06/22/19 0505  PROCALCITON  --   --  100.01 64.14 37.66  LATICACIDVEN 5.5* 5.3* 4.8*  --   --     Recent Results (from the past 240 hour(s))  Blood Culture (routine x 2)     Status: Abnormal (Preliminary result)   Collection Time: 06/19/19  9:51 PM   Specimen: BLOOD RIGHT HAND  Result Value Ref Range Status   Specimen Description   Final    BLOOD RIGHT HAND Performed at [facility] [address] [place], [place] [address]    Special Requests   Final    BOTTLES DRAWN AEROBIC AND ANAEROBIC Blood Culture adequate volume Performed at [facility] [address], [place], [place] [address]    Culture  Setup Time   Final    AEROBIC BOTTLE ONLY GRAM NEGATIVE RODS CRITICAL RESULT CALLED TO, READ BACK BY AND VERIFIED WITH: PHARMD M HICKS [id number] AT 33 AM BY CM Performed at [facility], [place] [address], [place], [place] [address]     Culture ESCHERICHIA COLI (A)  Final   Report Status PENDING  Incomplete  Blood Culture ID Panel (Reflexed)     Status: Abnormal   Collection Time: 06/19/19  9:51 PM  Result Value Ref Range Status   Enterococcus species NOT DETECTED NOT DETECTED Final   Listeria monocytogenes NOT DETECTED NOT DETECTED Final   Staphylococcus species NOT DETECTED NOT DETECTED Final   Staphylococcus aureus (BCID) NOT DETECTED NOT DETECTED Final   Streptococcus species NOT DETECTED NOT DETECTED Final   Streptococcus agalactiae NOT DETECTED NOT DETECTED Final   Streptococcus pneumoniae NOT DETECTED NOT DETECTED Final   Streptococcus pyogenes NOT DETECTED NOT DETECTED Final   Acinetobacter baumannii NOT DETECTED NOT DETECTED Final   Enterobacteriaceae species DETECTED (A) NOT DETECTED Final    Comment: Enterobacteriaceae represent a large family of gram-negative bacteria, not a single organism. CRITICAL RESULT CALLED TO, READ BACK BY AND VERIFIED WITH: PHARMD M HICKS [id number] AT 60 AM BY CM    Enterobacter cloacae complex NOT DETECTED NOT DETECTED Final   Escherichia coli DETECTED (A) NOT DETECTED Final    Comment: CRITICAL RESULT CALLED TO, READ BACK BY AND VERIFIED WITH: PHARMD M HICKS [id number] AR 105 AM BY CM    Klebsiella oxytoca NOT DETECTED NOT DETECTED Final   Klebsiella pneumoniae NOT DETECTED NOT DETECTED Final   Proteus species NOT DETECTED NOT DETECTED Final   Serratia marcescens NOT DETECTED NOT DETECTED Final   Carbapenem resistance NOT DETECTED NOT DETECTED Final   Haemophilus influenzae NOT DETECTED NOT DETECTED Final   Neisseria meningitidis NOT DETECTED NOT DETECTED Final   Pseudomonas aeruginosa NOT DETECTED NOT DETECTED Final   Candida albicans NOT DETECTED NOT DETECTED Final   Candida glabrata NOT DETECTED NOT DETECTED Final   Candida krusei NOT DETECTED NOT DETECTED Final   Candida parapsilosis NOT DETECTED NOT DETECTED Final   Candida tropicalis NOT DETECTED NOT DETECTED Final     Comment: Performed at [facility], [place]. [address], [place], [place] [address]  Blood Culture (routine x 2)     Status: None (Preliminary result)   Collection Time: 06/19/19  9:55 PM   Specimen: BLOOD LEFT HAND  Result Value Ref Range Status   Specimen Description   Final    BLOOD LEFT HAND Performed at [facility] [address], [place], [place] [address]    Special Requests   Final    BOTTLES DRAWN AEROBIC AND ANAEROBIC Blood Culture adequate volume Performed at [facility] [address], [place], [place] [address]    Culture  Setup Time   Final    AEROBIC BOTTLE ONLY  GRAM NEGATIVE RODS CRITICAL VALUE NOTED.  VALUE IS CONSISTENT WITH PREVIOUSLY REPORTED AND CALLED VALUE. Performed at [facility], [place] [address] [place], [place] [address]    Culture GRAM NEGATIVE RODS  Final   Report Status PENDING  Incomplete  Urine culture     Status: Abnormal   Collection Time: 06/19/19 10:00 PM   Specimen: In/Out Cath Urine  Result Value Ref Range Status   Specimen Description   Final    IN/OUT CATH URINE Performed at [facility], [place] [address], [place], [place] [address]    Special Requests   Final    NONE Performed at [facility], [place] [address], [place], [place] [address]    Culture (A)  Final    60,000 COLONIES/mL KLEBSIELLA PNEUMONIAE 40,000 COLONIES/mL VANCOMYCIN RESISTANT ENTEROCOCCUS ISOLATED    Report Status 06/22/2019 FINAL  Final   Organism ID, Bacteria KLEBSIELLA PNEUMONIAE (A)  Final   Organism ID, Bacteria VANCOMYCIN RESISTANT ENTEROCOCCUS ISOLATED (A)  Final      Susceptibility   Klebsiella pneumoniae - MIC*    AMPICILLIN RESISTANT Resistant     CEFAZOLIN <=4 SENSITIVE Sensitive     CEFTRIAXONE <=1 SENSITIVE Sensitive     CIPROFLOXACIN <=0.25 SENSITIVE Sensitive     GENTAMICIN <=1 SENSITIVE Sensitive     IMIPENEM <=0.25 SENSITIVE Sensitive     NITROFURANTOIN <=16  SENSITIVE Sensitive     TRIMETH/SULFA <=20 SENSITIVE Sensitive     AMPICILLIN/SULBACTAM 4 SENSITIVE Sensitive     PIP/TAZO <=4 SENSITIVE Sensitive     * 60,000 COLONIES/mL KLEBSIELLA PNEUMONIAE   Vancomycin resistant enterococcus isolated - MIC*    AMPICILLIN 16 RESISTANT Resistant     NITROFURANTOIN 64 INTERMEDIATE Intermediate     VANCOMYCIN >=32 RESISTANT Resistant     * 40,000 COLONIES/mL VANCOMYCIN RESISTANT ENTEROCOCCUS ISOLATED  SARS Coronavirus 2 by RT PCR (hospital order, performed in [facility] hospital lab) Nasopharyngeal Nasopharyngeal Swab     Status: None   Collection Time: 06/19/19 10:30 PM   Specimen: Nasopharyngeal Swab  Result Value Ref Range Status   SARS Coronavirus 2 NEGATIVE NEGATIVE Final    Comment: (NOTE) SARS-CoV-2 target nucleic acids are NOT DETECTED.  The SARS-CoV-2 RNA is generally detectable in upper and lower respiratory specimens during the acute phase of infection. The lowest concentration of SARS-CoV-2 viral copies this assay can detect is 250 copies / mL. A negative result does not preclude SARS-CoV-2 infection and should not be used as the sole basis for treatment or other patient management decisions.  A negative result may occur with improper specimen collection / handling, submission of specimen other than nasopharyngeal swab, presence of viral mutation(s) within the areas targeted by this assay, and inadequate number of viral copies (<250 copies / mL). A negative result must be combined with clinical observations, patient history, and epidemiological information.  Fact Sheet for Patients:   StrictlyIdeas.no  Fact Sheet for Healthcare Providers: BankingDealers.co.za  This test is not yet approved or  cleared by the [place] FDA and has been authorized for detection and/or diagnosis of SARS-CoV-2 by FDA under an Emergency Use Authorization (EUA).  This EUA will remain in effect (meaning  this test can be used) for the duration of the COVID-19 declaration under Section 564(b)(1) of the Act, 21 U.S.C. section 360bbb-3(b)(1), unless the authorization is terminated or revoked sooner.  Performed at [facility], [place] [address] [place], [place] [address]   MRSA PCR Screening  Status: None   Collection Time: 06/20/19  6:38 PM   Specimen: Nasal Mucosa; Nasopharyngeal  Result Value Ref Range Status   MRSA by PCR NEGATIVE NEGATIVE Final    Comment:        The GeneXpert MRSA Assay (FDA approved for NASAL specimens only), is one component of a comprehensive MRSA colonization surveillance program. It is not intended to diagnose MRSA infection nor to guide or monitor treatment for MRSA infections. Performed at [facility], [place] [address], [place],  [address]          Radiology Studies: NM Hepatobiliary Liver Func  Result Date: 06/20/2019 CLINICAL DATA:  Concern for cholecystitis on CT exam. Negative ultrasound exam. No biliary duct dilatation on CT exam. EXAM: NUCLEAR MEDICINE HEPATOBILIARY IMAGING TECHNIQUE: Sequential images of the abdomen were obtained out to 60 minutes following intravenous administration of radiopharmaceutical. RADIOPHARMACEUTICALS:  5.4 mCi Tc-56m  Choletec IV COMPARISON:  None. FINDINGS: Mild delay in radiotracer clearance from the blood pool. There is uniform uptake within liver. The common bile duct is not identified over a 2 hour exam. No activity in the bowel. The gallbladder subsequently does not fill. IMPRESSION: No excretion of radiotracer into the bowel. Bile duct obstruction is not favored as the biliary tree with normal on comparison ultrasound and CT. Therefore favor hepatic dysfunction such as hepatitis. Electronically Signed   By: Suzy Bouchard M.D.   On: 06/20/2019 13:00   ECHOCARDIOGRAM COMPLETE  Result Date: 06/20/2019    ECHOCARDIOGRAM REPORT   Patient Name:   AARIEL EMS Date of  Exam: 06/20/2019 Medical Rec #:  [id number]     Height:       63.0 in Accession #:    [id number]    Weight:       154.3 lb Date of Birth:  01-19-1927     BSA:          1.732 m Patient Age:    86 years      BP:           149/60 mmHg Patient Gender: F             HR:           93 bpm. Exam Location:  Inpatient Procedure: 2D Echo, Cardiac Doppler and Color Doppler Indications:    CHF-Acute Diastolic 213.08 / M57.84  History:        Patient has no prior history of Echocardiogram examinations.                 Stroke; Risk Factors:Hypertension and Non-Smoker. Sepsis. GERD.  Sonographer:    Vickie Epley RDCS Referring Phys: [id number] [place]  Sonographer Comments: Image acquisition challenging due to respiratory motion. IMPRESSIONS  1. Left ventricular ejection fraction, by estimation, is 60 to 65%. The left ventricle has normal function. The left ventricle has no regional wall motion abnormalities. There is mild left ventricular hypertrophy. Left ventricular diastolic parameters are indeterminate.  2. Right ventricular systolic function is normal. The right ventricular size is normal. There is normal pulmonary artery systolic pressure. The estimated right ventricular systolic pressure is 84.1 mmHg.  3. The mitral valve is normal in structure. No evidence of mitral valve regurgitation.  4. The aortic valve was not well visualized. Aortic valve regurgitation is not visualized. No aortic stenosis is present.  5. The inferior vena cava is normal in size with greater than 50% respiratory variability, suggesting right atrial pressure of 3 mmHg. FINDINGS  Left Ventricle:  Left ventricular ejection fraction, by estimation, is 60 to 65%. The left ventricle has normal function. The left ventricle has no regional wall motion abnormalities. The left ventricular internal cavity size was small. There is mild left ventricular hypertrophy. Left ventricular diastolic parameters are indeterminate. Right Ventricle: The right ventricular  size is normal. Right vetricular wall thickness was not assessed. Right ventricular systolic function is normal. There is normal pulmonary artery systolic pressure. The tricuspid regurgitant velocity is 2.37 m/s, and with an assumed right atrial pressure of 3 mmHg, the estimated right ventricular systolic pressure is 79.3 mmHg. Left Atrium: Left atrial size was normal in size. Right Atrium: Right atrial size was normal in size. Pericardium: Trivial pericardial effusion is present. Mitral Valve: The mitral valve is normal in structure. No evidence of mitral valve regurgitation. Tricuspid Valve: The tricuspid valve is normal in structure. Tricuspid valve regurgitation is trivial. Aortic Valve: The aortic valve was not well visualized. Aortic valve regurgitation is not visualized. No aortic stenosis is present. Pulmonic Valve: The pulmonic valve was not well visualized. Pulmonic valve regurgitation is trivial. Aorta: The aortic root and ascending aorta are structurally normal, with no evidence of dilitation. Venous: The inferior vena cava is normal in size with greater than 50% respiratory variability, suggesting right atrial pressure of 3 mmHg. IAS/Shunts: The interatrial septum was not well visualized.  LEFT VENTRICLE PLAX 2D LVIDd:         3.10 cm LVIDs:         2.00 cm LV PW:         0.90 cm LV IVS:        0.90 cm LVOT diam:     1.70 cm LV SV:         48 LV SV Index:   28 LVOT Area:     2.27 cm  RIGHT VENTRICLE TAPSE (M-mode): 2.1 cm LEFT ATRIUM             Index       RIGHT ATRIUM           Index LA diam:        3.40 cm 1.96 cm/m  RA Area:     11.60 cm LA Vol (A2C):   26.1 ml 15.07 ml/m RA Volume:   18.80 ml  10.86 ml/m LA Vol (A4C):   23.0 ml 13.28 ml/m LA Biplane Vol: 26.3 ml 15.19 ml/m  AORTIC VALVE LVOT Vmax:   109.00 cm/s LVOT Vmean:  77.800 cm/s LVOT VTI:    0.213 m  AORTA Ao Root diam: 2.90 cm TRICUSPID VALVE TR Peak grad:   22.5 mmHg TR Vmax:        237.00 cm/s  SHUNTS Systemic VTI:  0.21 m Systemic  Diam: 1.70 cm Oswaldo Milian MD Electronically signed by Oswaldo Milian MD Signature Date/Time: 06/20/2019/7:41:35 PM    Final         Scheduled Meds: . enoxaparin (LOVENOX) injection  1 mg/kg (Adjusted) Subcutaneous BID  . loratadine  10 mg Oral Daily  . metoprolol tartrate  12.5 mg Oral BID  . mirtazapine  7.5 mg Oral QHS  . pantoprazole  40 mg Oral Daily  . risperiDONE  0.25 mg Oral QPM  . sertraline  125 mg Oral QHS  . simethicone  80 mg Oral TID WC   Continuous Infusions: . cefTRIAXone (ROCEPHIN)  IV    . dextrose 5 % and 0.45% NaCl 75 mL/hr at 06/21/19 0533  . levETIRAcetam 250 mg (06/22/19 1011)  .  potassium chloride 10 mEq (06/22/19 1031)     LOS: 2 days   Time spent= 35 mins    Kerrie Latour Arsenio Loader, MD [facility]  If 7PM-7AM, please contact night-coverage  06/22/2019, 10:42 AM

## 2019-06-22 NOTE — TOC Progression Note (Signed)
Transition of Care [facility]) - Progression Note    Patient Details  Name: Erica [name] MRN: [id number] Date of Birth: September 25, 1927  [facility]) CM/SW Contact  Purcell Mouton, RN Phone Number: 06/22/2019, 1:01 PM  Clinical Narrative:     Pt is from [facility] and will return when stable.    Expected Discharge Plan: [place] Barriers to Discharge: No Barriers Identified  Expected Discharge Plan and Services Expected Discharge Plan: [place] arrangements for the past 2 months: [place]                                       Social Determinants of Health (SDOH) Interventions    Readmission Risk Interventions No flowsheet data found.

## 2019-06-22 NOTE — Progress Notes (Signed)
Spiritual care rounding on pt due to referral from respiratory.   Pt's family not present at this time.  Pt asleep.  Will follow up with pt for family support during this hospitalization.  Please page as pt or family needs arise.      [phone]

## 2019-06-22 NOTE — Progress Notes (Signed)
Received text from Jilda Panda, infection prevention--reports pt has VRE in urine--to implement Contact PPE. SRP, RN

## 2019-06-22 NOTE — Plan of Care (Signed)

## 2019-06-22 NOTE — Progress Notes (Addendum)
Speech Language Pathology Treatment:    Patient Details Name: Erica [name] MRN: [id number] DOB: January 07, 1927 Today's Date: 06/22/2019 Time: 7846-9629 SLP Time Calculation (min) (ACUTE ONLY): 24 min  Assessment / Plan / Recommendation Clinical Impression  Pt not alert enough for po- does not awaken adequately to verbal/tactile stimulation including sternal rub and cold wet cloth to face with RT.  Pt answered "yes" only to her name with very weak voice and maintained focus attention for only 1 second- before falling asleep again.  Erica [name], sister, present and reports pt's breathing is worse today than yesterday.    Oral cavity was clear - pt demonstrating open mouth breathing with occasional snoring.  Note pending order for MBS and purpose of visit is to assess for readiness for testing and for family education.  Given pt's mentation has frequently included either agitation/fidgeting subsequently frequently requiring sedating medications to keep her calm and safe, concern for nutrition/airway protection ongoing.     Daughter Erica [name] states that pt's HCPOA advised she will "support Erica [name]'s decisions".  Introduced concept of comfort feeding and provided hand out detailing information re: causes of dysphagia and modifications to diet, etc to allow intake for enjoyment- NOT nutritional support.  Daughter became tearful but reports "we have been talking about this for a long time" and there are no plans for feeding tube.  Advised given pt's inconsistent mentation, h/o dysphagia with aspiration of Keppra at SNF in February 2021 and concern for pna - chronic aspiration risk is present.  Pt meets the 3 Pillars of Asp pna risk.    At this time, pt's mentation does not allow for MBS.  If pt's mentation improves, she may be appropriate for MBS this afternoon. SLP highly recommends a palliative consult to establish GOC with this pt with chronic medical issues/comorbidities.     HPI HPI: 86 yo female adm to [facility] with  acute cholecystitis vs aspiration PNA.  Pt PMH + for cva 2013 at left occipital and parietal lobes with dysphagia, vision deficits.  Since stroke, pt has been in some sort of rehab place - staying in [facility] and then in [facility].  She has reportedly had a functional decline over the last few months - requiring someone to feed her over the last month per her daughter.  Pt with liver dysfunction per labs, HIDDA scan inconclusive and at this time plan for possible biliary drain on hold.  Imaging 06/20/2019 Showing significant bibasilar airspace disease favored to represent combination of both atelectasis and consolidation especially at the right lung base.    There is question if pt may have aspirated from vomiting prior to admission per MD.  Daughter Erica [name] reports she fed her mother one month ago and though her intake was poor she tolerated without coughing.RR at baseline today is 30 with accessory muscle use noted.  Pt currently presents with congested cough - that is not productive.  Daughter Erica [name] reports pt's breathing is not at baseline level and is worse today than yesterday.      SLP Plan  MBS (hopefully 06/22/2019 when/if able to participate, recommend palliative consult to establish goals)       Recommendations  Diet recommendations: NPO (single ice chips, tps nectar when fully alert) Liquids provided via: Teaspoon Medication Administration: Whole meds with puree Supervision: Staff to assist with self feeding Compensations: Slow rate;Small sips/bites;Other (Comment) (oral suction prn) Postural Changes and/or Swallow Maneuvers: Seated upright 90 degrees;Upright 30-60 min after meal  Oral Care Recommendations: Oral care QID SLP Visit Diagnosis: Dysphagia, oropharyngeal phase (R13.12) Plan: MBS (hopefully 06/22/2019 when/if able to participate, recommend palliative consult to establish goals)       GO                [name] 06/22/2019, 12:51  PM   Kathleen Lime, MS [place] Office [phone]

## 2019-06-22 NOTE — Progress Notes (Signed)
CC: Lethargy and vomiting  Subjective: Patient's eyes are closed and she is not really responding to me, the nurse, nor her daughter right now.  [facility]  Her daughter says she sometimes open her eyes and looks around when they visit.  Then she will close her eyes and drift off again.  [facility], about half of the tray.  She was not feeding herself.  Currently she seems comfortable with palpation of her abdomen.  She did not even blink when I was palpating the abdomen.  Objective: Vital signs in last 24 hours: Temp:  [97.5 F (36.4 C)-98.6 F (37 C)] 98.3 F (36.8 C) (06/16 0543) Pulse Rate:  [79-94] 84 (06/16 0543) Resp:  [17-22] 22 (06/16 0543) BP: (101-161)/(77-88) 101/88 (06/16 0543) SpO2:  [95 %-97 %] 97 % (06/16 0543) Last BM Date: 06/20/19 1741 IV Nothing p.o. recorded 1490 urine Stool x2 Afebrile vital signs are stable Potassium 2.8, chloride 112, albumin 2.8, LFTs: CMP Latest Ref Rng & Units 06/22/2019 06/21/2019 06/20/2019  Total Bilirubin 0.3 - 1.2 mg/dL 1.8(H) 2.9(H) 2.3(H)  Alkaline Phos 38 - 126 U/L 116 102 110  AST 15 - 41 U/L 62(H) 123(H) 234(H)  ALT 0 - 44 U/L 98(H) 132(H) 175(H)  BNP 338 Procalcitonin 100>>64.14>>37.66 WBC 7.7>>14.4>>9.3>>8.4 Hepatitis panel is negative VAncomycin resistant Klebsiella pneumonia in urine abd [place] 6/13: No gallstones or wall thickening visualized no Murphy sign Common bile duct 6 mm CT of the abdomen pelvis with contrast 6/13: Gallbladder wall thickening and adjacent inflammatory changes.  There also appeared to be pericholecystic free fluid.  Possible acute cholecystitis HIDA scan shows no excretion of radiotracer into the bowel.  Bile duct obstruction was not favored as the biliary tree was normal on comparison to the ultrasound   Intake/Output from previous day: 06/15 0701 - 06/16 0700 In: 1741 [I.V.:1306.3; IV Piggyback:434.7] Out: 1450  [Urine:1450] Intake/Output this shift: No intake/output data recorded.  General appearance: Eyes are closed, and she is not responding to anyone in the room currently. Resp: clear to auscultation bilaterally GI: Soft, nontender, positive bowel sounds.  Well-healed midline incision below the umbilicus.  No tenderness on palpation at all today.  Lab Results:  Recent Labs    06/21/19 0441 06/22/19 0505  WBC 9.3 8.4  HGB 11.6* 11.4*  HCT 37.0 35.9*  PLT 167 163    BMET Recent Labs    06/21/19 0441 06/22/19 0505  NA 144 139  K 3.6 2.8*  CL 114* 112*  CO2 20* 19*  GLUCOSE 93 104*  BUN 11 7*  CREATININE 1.01* 0.82  CALCIUM 7.2* 7.4*   PT/INR Recent Labs    06/20/19 0555 06/20/19 0658  LABPROT SPECIMEN CLOTTED 14.8  INR SPECIMEN CLOTTED 1.2    Recent Labs  Lab 06/19/19 2200 06/19/19 2208 06/20/19 0555 06/21/19 0441 06/22/19 0505  AST 231* 234* 234* 123* 62*  ALT 157* 152* 175* 132* 98*  ALKPHOS 152* 144* 110 102 116  BILITOT 2.3* 2.1* 2.3* 2.9* 1.8*  PROT 6.4* 6.3* 5.2* 5.5* 5.5*  ALBUMIN 3.6 3.4* 2.8* 2.9* 2.8*     Lipase     Component Value Date/Time   LIPASE 34 07/25/2018 1032     Medications: . enoxaparin (LOVENOX) injection  1 mg/kg (Adjusted) Subcutaneous BID  . loratadine  10 mg Oral Daily  . metoprolol tartrate  12.5 mg Oral BID  . mirtazapine  7.5 mg Oral  QHS  . pantoprazole  40 mg Oral Daily  . risperiDONE  0.25 mg Oral QPM  . sertraline  125 mg Oral QHS  . simethicone  80 mg Oral TID WC   . cefTRIAXone (ROCEPHIN)  IV    . dextrose 5 % and 0.45% NaCl 75 mL/hr at 06/21/19 0533  . levETIRAcetam 250 mg (06/21/19 2301)  . 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 Coli/Enterobacter - on zosyn Possible aspiration PNA UTI  Elevated LFTs HIDA scan was unhelpful as the radiotracer did not excrete from the liver c/w hepatic dysfunction.  Her  LFTs are still mildly elevated today.  This could be related to liver dysfunction, but could still be from her gallbladder.  Her exam is not impressive today.  She is AF, normal WBC.  Her US shows no gallstones.  I do not think at this time we need to consult IR for a perc chole drain.  I think we can try to treat her with abx alone for now and see how she does.  The daughter was concerned about her liver dysfunction.  We discussed her imaging showing hepatic steatosis and how this can cause some hepatic dysfunction.  Her hepatitis panel is negative.  She has asked for GI to give an opinion about her liver and make sure this isn't contributing to her symptoms.  She could have such dysfunction that she has stasis of the biliary tree causing her LFTs to rise and for bacteria to form.  Either way, for now I think we can manage conservatively as the family is for "treat what we can, but aim for comfort ultimately."  They definitely do NOT want any type of surgery, which I think is totally reasonable.  We will continue to follow.  I have discussed this case with GI who is going to see her as well.  -GI evaluation: LFT elevation most likely related to sepsis, would continue to trend LFTs, no role for ERCP, no plan for additional serologic work-up; GI signed off  -   Conservative antibiotic Rx recommended   FEN - IV fluids; per swallow study, MBS scheduled for today; NPO for now VTE - Lovenox, therapeutic ID - Unasyn x1 6/13; zosyn 6/14-6/16; Rocephin 6/16>>  Plan: Continue conservative medical management with antibiotics.  LOS: 2 days    Naja Apperson 06/22/2019 Please see Amion

## 2019-06-22 NOTE — Progress Notes (Signed)
Respiratory therapist Jinny Blossom, rounding to complete breathing treatment as ordered by MD, along with speech therapist to assess for upcoming MBS study ordered. Pt sleepy and difficult to arouse, RN rounding to assess pt and sternal rub pt and repositioned, pt became very agitated, pushing and swinging arm, tore RN PPE gown and "struck hand near face area". Pt daughter at beside, MD returned to pt bedside at the RRT request, chest xray ordered, meds administered as ordered. Pt remains sleepy and has AMS compared to previous day. Pt have not received meds to caused sedation by present Probation officer. Pt was given routine meds as scheduled. VS stable in CHL will cont to monitor. Temp 99.3 .SRP, RN

## 2019-06-22 NOTE — Progress Notes (Signed)
Daughter Erica [name] at bedside, pt talking some with daughter. More alert that earlier, sleeps then awakes to interact with daughter. When you go to move or reposition pt she will resist and ask you to "stop" otherwise she will sleep when left alone. Will cont to monitor. SRP, RN

## 2019-06-23 LAB — CULTURE, BLOOD (ROUTINE X 2)
Special Requests: ADEQUATE
Special Requests: ADEQUATE

## 2019-06-23 LAB — COMPREHENSIVE METABOLIC PANEL
ALT: 74 U/L — ABNORMAL HIGH (ref 0–44)
AST: 38 U/L (ref 15–41)
Albumin: 3 g/dL — ABNORMAL LOW (ref 3.5–5.0)
Alkaline Phosphatase: 144 U/L — ABNORMAL HIGH (ref 38–126)
Anion gap: 9 (ref 5–15)
BUN: 6 mg/dL — ABNORMAL LOW (ref 8–23)
CO2: 23 mmol/L (ref 22–32)
Calcium: 7.5 mg/dL — ABNORMAL LOW (ref 8.9–10.3)
Chloride: 103 mmol/L (ref 98–111)
Creatinine, Ser: 0.77 mg/dL (ref 0.44–1.00)
GFR calc Af Amer: >60 mL/min (ref >60)
GFR calc non Af Amer: >60 mL/min (ref >60)
Glucose, Bld: 104 mg/dL — ABNORMAL HIGH (ref 70–99)
Potassium: 2.8 mmol/L — ABNORMAL LOW (ref 3.5–5.1)
Sodium: 135 mmol/L (ref 135–145)
Total Bilirubin: 1.5 mg/dL — ABNORMAL HIGH (ref 0.3–1.2)
Total Protein: 6.1 g/dL — ABNORMAL LOW (ref 6.5–8.1)

## 2019-06-23 LAB — CBC
HCT: 38.6 % (ref 36.0–46.0)
Hemoglobin: 12.3 g/dL (ref 12.0–15.0)
MCH: 29.7 pg (ref 26.0–34.0)
MCHC: 31.9 g/dL (ref 30.0–36.0)
MCV: 93.2 fL (ref 80.0–100.0)
Platelets: 158 10*3/uL (ref 150–400)
RBC: 4.14 MIL/uL (ref 3.87–5.11)
RDW: 15.1 % (ref 11.5–15.5)
WBC: 7 10*3/uL (ref 4.0–10.5)
nRBC: 0 % (ref 0.0–0.2)

## 2019-06-23 LAB — GLUCOSE, CAPILLARY
Glucose-Capillary: 104 mg/dL — ABNORMAL HIGH (ref 70–99)
Glucose-Capillary: 106 mg/dL — ABNORMAL HIGH (ref 70–99)
Glucose-Capillary: 106 mg/dL — ABNORMAL HIGH (ref 70–99)
Glucose-Capillary: 120 mg/dL — ABNORMAL HIGH (ref 70–99)

## 2019-06-23 LAB — PROCALCITONIN: Procalcitonin: 22.67 ng/mL

## 2019-06-23 LAB — BRAIN NATRIURETIC PEPTIDE: B Natriuretic Peptide: 232 pg/mL — ABNORMAL HIGH (ref 0.0–100.0)

## 2019-06-23 LAB — MAGNESIUM: Magnesium: 2 mg/dL (ref 1.7–2.4)

## 2019-06-23 MED ORDER — POTASSIUM CHLORIDE 10 MEQ/100ML IV SOLN
10.0000 meq | INTRAVENOUS | Status: AC
  Administered 2019-06-23 (×4): 10 meq via INTRAVENOUS
  Filled 2019-06-23: qty 100

## 2019-06-23 MED ORDER — FUROSEMIDE 40 MG PO TABS
40.0000 mg | ORAL_TABLET | Freq: Once | ORAL | Status: DC
  Filled 2019-06-23: qty 1

## 2019-06-23 MED ORDER — POTASSIUM CHLORIDE 10 MEQ/100ML IV SOLN
INTRAVENOUS | Status: AC
  Filled 2019-06-23: qty 100

## 2019-06-23 MED ORDER — CEFAZOLIN SODIUM-DEXTROSE 2-4 GM/100ML-% IV SOLN
2.0000 g | Freq: Three times a day (TID) | INTRAVENOUS | Status: DC
  Administered 2019-06-23 – 2019-06-24 (×3): 2 g via INTRAVENOUS
  Filled 2019-06-23 (×4): qty 100

## 2019-06-23 MED ORDER — POTASSIUM CHLORIDE 20 MEQ/15ML (10%) PO SOLN
40.0000 meq | Freq: Every day | ORAL | Status: DC
  Filled 2019-06-23: qty 30

## 2019-06-23 NOTE — Progress Notes (Signed)
I was informed by Jonelle Sidle, from [employer], that family is interested in transitioning patient back to her facility with hospice without any aggressive intervention.  MOST form was filled out.  I personally spoke with patient's healthcare power of attorney, her daughter [name]-all the siblings are in agreement that patient should be transitioned to comfort care without any aggressive measures.  Okay with continuing IV antibiotics and only treated as needed otherwise no aggressive measures including CPR, advanced airway, procedures, aggressive fluid resuscitation, feeding tube.  At this time would like to continue her routine medications if patient allows to take it orally otherwise it can be held.  We will monitor patient overnight and keep her comfortable.  We will transition her back to her facility tomorrow with hospice.  Call with further questions as needed.  Gerlean Ren MD [facility]

## 2019-06-23 NOTE — Progress Notes (Signed)
PROGRESS NOTE    Erica [name]  IFO:[id number] DOB: 28-Jan-1927 DOA: 06/19/2019 PCP: Mast, Man X, NP   Brief Narrative:   -year-old with history of vascular dementia, previous DVT on Xarelto, hemorrhagic CVA with residual right-sided weakness, HTN, seizure disorder, GERD sent from skilled nursing facility for evaluation of lethargy and vomiting.  Initially noted to be febrile tachycardic with elevated lactic acidosis.  Transaminitis but ultrasound was negative for acute cholecystitis.  Initial CMP showed acute cholecystitis with bibasilar pneumonia.  Started on Zosyn.  Blood cultures grew E. coli, HIDA scan was a poor study.  GI recommended conservative management.  Failed speech and swallow therapy-moderate aspiration risk, MBS.   Assessment & Plan:   Principal Problem:   Severe sepsis with acute organ dysfunction due to Gram negative bacteria (HCC) Active Problems:   Essential hypertension   GERD without esophagitis   Vascular dementia with behavior disturbance (HCC)   Chronic anticoagulation   Goals of care, counseling/discussion   Seizure disorder (HCC)   Acute cholecystitis   Left ureteral stone   Lactic acidosis   Gram-negative sepsis with organ dysfunction (HCC)   Aspiration pneumonia of both lower lobes due to gastric secretions (HCC)   Elevated AST (SGOT)   Elevated ALT measurement   Elevated bilirubin  Sepsis, unknown exact etiology intra-abdominal versus aspiration pneumonia Urinary tract infection-VRE, Klebsiella E. coli bacteremia -CT showed acute cholecystitis but right upper quadrant ultrasound is not consistent with this. -HIDA scan-poor study with incomplete intake. -Seen by general surgery and GI.  Conservative management. -I believe VRE is a colonizer therefore we will hold off on treating that.  Continue IV Rocephin. -Start Lovenox 1mg /kg every 12 hours.  Xarelto on hold  Hypokalemia -Aggressive repletion.  Aspiration pneumonia versus  pneumonitis Pulmonary edema -Bronchodilators as needed.  Supplemental oxygen.  Responded well to IV Lasix yesterday, will give 1 dose of p.o. Lasix 40 mg today. -IV Rocephin -Speech and swallow evaluation-moderate aspiration risk.  Plans for MBS today.  GERD without esophagitis -PPI  Left-sided nonobstructive renal stone with urinary tract infection, growing Klebsiella -Hydration, supportive care.  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  Will use Lovenox instead for now  Seizure disorder -On Keppra   DVT prophylaxis: Lovenox Code Status: DNR Family Communication: Spoke with daughter yesterday at bedside  Status is: Inpatient  Remains inpatient appropriate because:Hemodynamically unstable   Dispo: The patient is from: SNF              Anticipated d/c is to: SNF              Anticipated d/c date is: 2-3 days              Patient currently is not medically stable to d/c.  Maintain hospital stay for IV antibiotic, currently she is n.p.o. ongoing evaluation for her dysphagia. Subjective: Yesterday afternoon patient was quite dyspneic and there was evidence of fluid overload required IV Lasix.  This morning she is much better.  She is alert to her name only which is more or less her baseline.  Review of Systems Otherwise negative except as per HPI, including: General: Denies fever, chills, night sweats or unintended weight loss. Resp: Denies cough, wheezing, shortness of breath. Cardiac: Denies chest pain, palpitations, orthopnea, paroxysmal nocturnal dyspnea. GI: Denies abdominal pain, nausea, vomiting, diarrhea or constipation GU: Denies dysuria,  frequency, hesitancy or incontinence MS: Denies muscle aches, joint pain or swelling Neuro: Denies headache, neurologic deficits (focal weakness,  numbness, tingling), abnormal gait Psych: Denies anxiety, depression, SI/HI/AVH Skin: Denies new rashes or lesions ID: Denies sick contacts, exotic exposures, travel Examination: Constitutional: Not in acute distress, 2 L nasal cannula Respiratory: Bibasilar crackles, very minimal expiratory wheezing at the bases. Cardiovascular: Normal sinus rhythm, no rubs Abdomen: Nontender nondistended good bowel sounds Musculoskeletal: No edema noted Skin: No rashes seen Neurologic: CN 2-12 grossly intact.  And nonfocal Psychiatric: Alert to name only, poor judgment and insight.  Objective: Vitals:   06/22/19 1941 06/22/19 2007 06/23/19 0610 06/23/19 0905  BP:  (!) 158/82 (!) 160/71   Pulse:  85 80   Resp:  18 20   Temp:  98.6 F (37 C) 98.5 F (36.9 C)   TempSrc:  Axillary Oral   SpO2: 97% 94% 92% 95%  Weight:      Height:        Intake/Output Summary (Last 24 hours) at 06/23/2019 1127 Last data filed at 06/23/2019 1287 Gross per 24 hour  Intake 1194.17 ml  Output 5000 ml  Net -3805.83 ml   Filed Weights   06/19/19 2158  Weight: 70 kg     Data Reviewed:   CBC: Recent Labs  Lab 06/19/19 2200 06/19/19 2200 06/20/19 0555 06/20/19 0658 06/21/19 0441 06/22/19 0505 06/23/19 0435  WBC 7.7   < > SPECIMEN CLOTTED 14.4* 9.3 8.4 7.0  NEUTROABS 7.0  --  SPECIMEN CLOTTED 12.9*  --   --   --   HGB 15.2*   < > SPECIMEN CLOTTED 12.9 11.6* 11.4* 12.3  HCT 48.4*   < > SPECIMEN CLOTTED 42.3 37.0 35.9* 38.6  MCV 96.2   < > SPECIMEN CLOTTED 100.7* 95.4 93.7 93.2  PLT 222   < > SPECIMEN CLOTTED 210 167 163 158   < > = values in this interval not displayed.   Basic Metabolic Panel: Recent Labs  Lab 06/19/19 2200 06/20/19 0555 06/21/19 0441 06/22/19 0505 06/23/19 0435  NA 147* 143 144 139 135  K 3.1* 4.0 3.6 2.8* 2.8*  CL 111 114* 114* 112* 103  CO2 20* 18* 20* 19* 23  GLUCOSE 159* 158* 93 104* 104*  BUN 14 10 11  7* 6*  CREATININE 1.28* 1.06* 1.01* 0.82 0.77  CALCIUM 8.4* 7.0*  7.2* 7.4* 7.5*  MG  --   --  1.8 1.8 2.0   GFR: Estimated Creatinine Clearance: 44.7 mL/min (by C-G formula based on SCr of 0.77 mg/dL). Liver Function Tests: Recent Labs  Lab 06/19/19 2208 06/20/19 0555 06/21/19 0441 06/22/19 0505 06/23/19 0435  AST 234* 234* 123* 62* 38  ALT 152* 175* 132* 98* 74*  ALKPHOS 144* 110 102 116 144*  BILITOT 2.1* 2.3* 2.9* 1.8* 1.5*  PROT 6.3* 5.2* 5.5* 5.5* 6.1*  ALBUMIN 3.4* 2.8* 2.9* 2.8* 3.0*   No results for input(s): LIPASE, AMYLASE in the last 168 hours. Recent Labs  Lab 06/20/19 1423  AMMONIA 32   Coagulation Profile: Recent Labs  Lab 06/19/19 2200 06/20/19 0555 06/20/19 0658  INR 1.3* SPECIMEN CLOTTED 1.2   Cardiac Enzymes: No results for input(s): CKTOTAL, CKMB, CKMBINDEX, TROPONINI in the last 168 hours. BNP (last 3 results) No results for input(s): PROBNP in the last 8760 hours. HbA1C: No results for input(s): HGBA1C in the last 72 hours. CBG: Recent Labs  Lab 06/22/19 0544 06/22/19 1140 06/22/19 1801 06/22/19 2343 06/23/19 0610  GLUCAP 103* 92  99 85 106*   Lipid Profile: No results for input(s): CHOL, HDL, LDLCALC, TRIG, CHOLHDL, LDLDIRECT in the last 72 hours. Thyroid Function Tests: No results for input(s): TSH, T4TOTAL, FREET4, T3FREE, THYROIDAB in the last 72 hours. Anemia Panel: No results for input(s): VITAMINB12, FOLATE, FERRITIN, TIBC, IRON, RETICCTPCT in the last 72 hours. Sepsis Labs: Recent Labs  Lab 06/19/19 2200 06/20/19 0000 06/20/19 0555 06/21/19 0441 06/22/19 0505 06/23/19 0435  PROCALCITON  --   --  100.01 64.14 37.66 22.67  LATICACIDVEN 5.5* 5.3* 4.8*  --   --   --     Recent Results (from the past 240 hour(s))  Blood Culture (routine x 2)     Status: Abnormal   Collection Time: 06/19/19  9:51 PM   Specimen: BLOOD RIGHT HAND  Result Value Ref Range Status   Specimen Description   Final    BLOOD RIGHT HAND Performed at [facility], [place] [address]  [place], [place] [address]    Special Requests   Final    BOTTLES DRAWN AEROBIC AND ANAEROBIC Blood Culture adequate volume Performed at [facility] [address], [place], [place] [address]    Culture  Setup Time   Final    AEROBIC BOTTLE ONLY GRAM NEGATIVE RODS CRITICAL RESULT CALLED TO, READ BACK BY AND VERIFIED WITH: PHARMD M HICKS [id number] AT 16 AM BY CM Performed at [facility], [place] [address] [place], [place] [address]    Culture ESCHERICHIA COLI (A)  Final   Report Status 06/23/2019 FINAL  Final   Organism ID, Bacteria ESCHERICHIA COLI  Final      Susceptibility   Escherichia coli - MIC*    AMPICILLIN 8 SENSITIVE Sensitive     CEFAZOLIN <=4 SENSITIVE Sensitive     CEFEPIME <=0.12 SENSITIVE Sensitive     CEFTAZIDIME <=1 SENSITIVE Sensitive     CEFTRIAXONE <=0.25 SENSITIVE Sensitive     CIPROFLOXACIN <=0.25 SENSITIVE Sensitive     GENTAMICIN <=1 SENSITIVE Sensitive     IMIPENEM <=0.25 SENSITIVE Sensitive     TRIMETH/SULFA <=20 SENSITIVE Sensitive     AMPICILLIN/SULBACTAM 4 SENSITIVE Sensitive     PIP/TAZO <=4 SENSITIVE Sensitive     * ESCHERICHIA COLI  Blood Culture ID Panel (Reflexed)     Status: Abnormal   Collection Time: 06/19/19  9:51 PM  Result Value Ref Range Status   Enterococcus species NOT DETECTED NOT DETECTED Final   Listeria monocytogenes NOT DETECTED NOT DETECTED Final   Staphylococcus species NOT DETECTED NOT DETECTED Final   Staphylococcus aureus (BCID) NOT DETECTED NOT DETECTED Final   Streptococcus species NOT DETECTED NOT DETECTED Final   Streptococcus agalactiae NOT DETECTED NOT DETECTED Final   Streptococcus pneumoniae NOT DETECTED NOT DETECTED Final   Streptococcus pyogenes NOT DETECTED NOT DETECTED Final   Acinetobacter baumannii NOT DETECTED NOT DETECTED Final   Enterobacteriaceae species DETECTED (A) NOT DETECTED Final    Comment: Enterobacteriaceae represent a large family of gram-negative bacteria, not a single  organism. CRITICAL RESULT CALLED TO, READ BACK BY AND VERIFIED WITH: PHARMD M HICKS [id number] AT 57 AM BY CM    Enterobacter cloacae complex NOT DETECTED NOT DETECTED Final   Escherichia coli DETECTED (A) NOT DETECTED Final    Comment: CRITICAL RESULT CALLED TO, READ BACK BY AND VERIFIED WITH: PHARMD M HICKS [id number] AR 77 AM BY CM    Klebsiella oxytoca NOT DETECTED NOT DETECTED Final   Klebsiella pneumoniae NOT DETECTED NOT DETECTED Final  Proteus species NOT DETECTED NOT DETECTED Final   Serratia marcescens NOT DETECTED NOT DETECTED Final   Carbapenem resistance NOT DETECTED NOT DETECTED Final   Haemophilus influenzae NOT DETECTED NOT DETECTED Final   Neisseria meningitidis NOT DETECTED NOT DETECTED Final   Pseudomonas aeruginosa NOT DETECTED NOT DETECTED Final   Candida albicans NOT DETECTED NOT DETECTED Final   Candida glabrata NOT DETECTED NOT DETECTED Final   Candida krusei NOT DETECTED NOT DETECTED Final   Candida parapsilosis NOT DETECTED NOT DETECTED Final   Candida tropicalis NOT DETECTED NOT DETECTED Final    Comment: Performed at [facility], [place] [address], [place], [place] [address]  Blood Culture (routine x 2)     Status: Abnormal   Collection Time: 06/19/19  9:55 PM   Specimen: BLOOD LEFT HAND  Result Value Ref Range Status   Specimen Description   Final    BLOOD LEFT HAND Performed at [facility] [address] [place], [place] [address]    Special Requests   Final    BOTTLES DRAWN AEROBIC AND ANAEROBIC Blood Culture adequate volume Performed at [facility] [address] [place], [place] [address]    Culture  Setup Time   Final    AEROBIC BOTTLE ONLY GRAM NEGATIVE RODS CRITICAL VALUE NOTED.  VALUE IS CONSISTENT WITH PREVIOUSLY REPORTED AND CALLED VALUE.    Culture (A)  Final    ESCHERICHIA COLI SUSCEPTIBILITIES PERFORMED ON PREVIOUS CULTURE WITHIN THE LAST 5 DAYS. Performed at [facility], [place]  [address], [place], [place] [address]    Report Status 06/23/2019 FINAL  Final  Urine culture     Status: Abnormal   Collection Time: 06/19/19 10:00 PM   Specimen: In/Out Cath Urine  Result Value Ref Range Status   Specimen Description   Final    IN/OUT CATH URINE Performed at [facility] [address], [place], [place] [address]    Special Requests   Final    NONE Performed at [facility], [place] [address], [place], [place] [address]    Culture (A)  Final    60,000 COLONIES/mL KLEBSIELLA PNEUMONIAE 40,000 COLONIES/mL VANCOMYCIN RESISTANT ENTEROCOCCUS ISOLATED    Report Status 06/22/2019 FINAL  Final   Organism ID, Bacteria KLEBSIELLA PNEUMONIAE (A)  Final   Organism ID, Bacteria VANCOMYCIN RESISTANT ENTEROCOCCUS ISOLATED (A)  Final      Susceptibility   Klebsiella pneumoniae - MIC*    AMPICILLIN RESISTANT Resistant     CEFAZOLIN <=4 SENSITIVE Sensitive     CEFTRIAXONE <=1 SENSITIVE Sensitive     CIPROFLOXACIN <=0.25 SENSITIVE Sensitive     GENTAMICIN <=1 SENSITIVE Sensitive     IMIPENEM <=0.25 SENSITIVE Sensitive     NITROFURANTOIN <=16 SENSITIVE Sensitive     TRIMETH/SULFA <=20 SENSITIVE Sensitive     AMPICILLIN/SULBACTAM 4 SENSITIVE Sensitive     PIP/TAZO <=4 SENSITIVE Sensitive     * 60,000 COLONIES/mL KLEBSIELLA PNEUMONIAE   Vancomycin resistant enterococcus isolated - MIC*    AMPICILLIN 16 RESISTANT Resistant     NITROFURANTOIN 64 INTERMEDIATE Intermediate     VANCOMYCIN >=32 RESISTANT Resistant     * 40,000 COLONIES/mL VANCOMYCIN RESISTANT ENTEROCOCCUS ISOLATED  SARS Coronavirus 2 by RT PCR (hospital order, performed in [place] hospital lab) Nasopharyngeal Nasopharyngeal Swab     Status: None   Collection Time: 06/19/19 10:30 PM   Specimen: Nasopharyngeal Swab  Result Value Ref Range Status   SARS Coronavirus 2  NEGATIVE NEGATIVE Final    Comment: (NOTE) SARS-CoV-2 target nucleic acids are NOT DETECTED.  The SARS-CoV-2 RNA is  generally detectable in upper and lower respiratory specimens during the acute phase of infection. The lowest concentration of SARS-CoV-2 viral copies this assay can detect is 250 copies / mL. A negative result does not preclude SARS-CoV-2 infection and should not be used as the sole basis for treatment or other patient management decisions.  A negative result may occur with improper specimen collection / handling, submission of specimen other than nasopharyngeal swab, presence of viral mutation(s) within the areas targeted by this assay, and inadequate number of viral copies (<250 copies / mL). A negative result must be combined with clinical observations, patient history, and epidemiological information.  Fact Sheet for Patients:   StrictlyIdeas.no  Fact Sheet for Healthcare Providers: BankingDealers.co.za  This test is not yet approved or  cleared by the [place] FDA and has been authorized for detection and/or diagnosis of SARS-CoV-2 by FDA under an Emergency Use Authorization (EUA).  This EUA will remain in effect (meaning this test can be used) for the duration of the COVID-19 declaration under Section 564(b)(1) of the Act, 21 U.S.C. section 360bbb-3(b)(1), unless the authorization is terminated or revoked sooner.  Performed at [facility], [place] [address], [place], [place] [address]   MRSA PCR Screening     Status: None   Collection Time: 06/20/19  6:38 PM   Specimen: Nasal Mucosa; Nasopharyngeal  Result Value Ref Range Status   MRSA by PCR NEGATIVE NEGATIVE Final    Comment:        The GeneXpert MRSA Assay (FDA approved for NASAL specimens only), is one component of a comprehensive MRSA colonization surveillance program. It is not intended to diagnose MRSA infection nor to guide or monitor treatment for MRSA infections. Performed at [facility], [place] [address],  [place], [place] [address]          Radiology Studies: [facility] Chest Port 1 View  Result Date: 06/22/2019 CLINICAL DATA:  Dyspnea EXAM: PORTABLE CHEST 1 VIEW COMPARISON:  06/19/2019 FINDINGS: Cardiac shadow is stable. Mild vascular congestion is again seen and slightly increased. Mild interstitial edema is now noted. Patchy increased density in the left base is noted consistent with mild atelectatic change. No bony abnormality is noted. IMPRESSION: Persistent and slightly increased vascular congestion with mild edema. Mild left basilar atelectasis. Electronically Signed   By: Inez Catalina M.D.   On: 06/22/2019 12:43        Scheduled Meds: . enoxaparin (LOVENOX) injection  1 mg/kg (Adjusted) Subcutaneous BID  . furosemide  40 mg Oral Once  . ipratropium-albuterol  3 mL Nebulization TID  . loratadine  10 mg Oral Daily  . metoprolol tartrate  12.5 mg Oral BID  . mirtazapine  7.5 mg Oral QHS  . pantoprazole  40 mg Oral Daily  . potassium chloride  40 mEq Oral Daily  . risperiDONE  0.25 mg Oral QPM  . sertraline  125 mg Oral QHS  . simethicone  80 mg Oral TID WC   Continuous Infusions: . cefTRIAXone (ROCEPHIN)  IV Stopped (06/22/19 2209)  . dextrose 25 mL/hr at 06/23/19 0600  . levETIRAcetam Stopped (06/22/19 2235)  . potassium chloride       LOS: 3 days   Time spent= 35 mins    Goro Wenrick Arsenio Loader, MD [facility]  If 7PM-7AM, please contact night-coverage  06/23/2019, 11:27 AM

## 2019-06-23 NOTE — Progress Notes (Signed)
  Speech Language Pathology Treatment: Dysphagia  Patient Details Name: Erica [name] MRN: [id number] DOB: August 25, 1927 Today's Date: 06/23/2019 Time: 0932-6712 SLP Time Calculation (min) (ACUTE ONLY): 14 min  Assessment / Plan / Recommendation Clinical Impression  Pt continues with poor mentation - lethargic with open mouth breathing - abdominal breathing.  Pt's daughter [name] present and advises desire for pt to be comfort care, including DNR, no IVs or feeding tubes, limited antibiotics and no repeat hospitalization unless for comfort.  SLP subsequently provided information regarding comfort feeding in writing and verbally.  Provided pt with same handout as her sister received during yesterday's visit.  Erica [name] states pt likely would not have been able to participate in swallow evaluation prior to admission as she "sleeps all the time".   Daughter is a Education officer, museum at [employer] and reports the facility is often only able to get her to take Chocolate icecream with Boost.  SLP reinforced comfort intake being small amounts with pt alert, upright and desiring intake and advised that oral moisture may be most comforting for her.  All education completed and no follow up needed as plan is for comfort care only per [place].   HPI HPI: 86 yo female adm to [facility] with acute cholecystitis vs aspiration PNA.  Pt PMH + for cva 2013 at left occipital and parietal lobes with dysphagia, vision deficits.  Since stroke, pt has been in some sort of rehab place - staying in [facility] and then in [facility].  She has reportedly had a functional decline over the last few months - requiring someone to feed her over the last month per her daughter.  Pt with liver dysfunction per labs, HIDDA scan inconclusive and at this time plan for possible biliary drain on hold.  Imaging 06/20/2019 Showing significant bibasilar airspace disease favored to represent combination of both atelectasis and consolidation especially at the right lung  base.    There is question if pt may have aspirated from vomiting prior to admission per MD.  Daughter Erica [name] reports she fed her mother one month ago and though her intake was poor she tolerated without coughing.RR at baseline today is 30 with accessory muscle use noted.  Pt currently presents with congested cough - that is not productive.  Daughter Erica [name] reports pt's breathing is not at baseline level and is worse today than yesterday. CXR yesterday showed slightly increased congestion.      SLP Plan  All goals met (comfort care plan determined by daughter)       Recommendations  Liquids provided via: Teaspoon Medication Administration: Whole meds with puree Supervision: Staff to assist with self feeding Compensations: Slow rate;Small sips/bites;Other (Comment) (oral suction prn) Postural Changes and/or Swallow Maneuvers: Seated upright 90 degrees;Upright 30-60 min after meal                Oral Care Recommendations: Oral care QID SLP Visit Diagnosis: Dysphagia, oropharyngeal phase (R13.12) Plan: All goals met (comfort care plan determined by daughter)       GO                [name] 06/23/2019, 5:05 PM

## 2019-06-23 NOTE — Progress Notes (Signed)
CC: Lethargy and vomiting  Subjective: Patient just woke up when I walked in the room.  On exam she had no abdominal pain.  Her daughter is there with her.  She was not awake or alert enough yesterday for a swallowing study. The introduced concept of comfort feeding, and some discussion of palliative consult with establishment of goals of care. Objective: Vital signs in last 24 hours: Temp:  [97.7 F (36.5 C)-99.3 F (37.4 C)] 98.5 F (36.9 C) (06/17 0610) Pulse Rate:  [74-85] 80 (06/17 0610) Resp:  [18-20] 20 (06/17 0610) BP: (118-160)/(71-82) 160/71 (06/17 0610) SpO2:  [92 %-97 %] 95 % (06/17 0905) Last BM Date: 06/22/19 1194 IV 5000 urine Stool x1 Afebrile vital signs are stable K+ 2.8 alk phos 144 albumin 3.0, AST down to 38, ALT down to 74, total bilirubin 1.5    Intake/Output from previous day: 06/16 0701 - 06/17 0700 In: 1194.2 [I.V.:379; IV Piggyback:815.2] Out: 5000 [Urine:5000] Intake/Output this shift: No intake/output data recorded.  General appearance: She was waking up when I came in and on exam she had no abdominal discomfort. GI: soft, non-tender; bowel sounds normal; no masses,  no organomegaly  Lab Results:  Recent Labs    06/22/19 0505 06/23/19 0435  WBC 8.4 7.0  HGB 11.4* 12.3  HCT 35.9* 38.6  PLT 163 158    BMET Recent Labs    06/22/19 0505 06/23/19 0435  NA 139 135  K 2.8* 2.8*  CL 112* 103  CO2 19* 23  GLUCOSE 104* 104*  BUN 7* 6*  CREATININE 0.82 0.77  CALCIUM 7.4* 7.5*   PT/INR No results for input(s): LABPROT, INR in the last 72 hours.  Recent Labs  Lab 06/19/19 2208 06/20/19 0555 06/21/19 0441 06/22/19 0505 06/23/19 0435  AST 234* 234* 123* 62* 38  ALT 152* 175* 132* 98* 74*  ALKPHOS 144* 110 102 116 144*  BILITOT 2.1* 2.3* 2.9* 1.8* 1.5*  PROT 6.3* 5.2* 5.5* 5.5* 6.1*  ALBUMIN 3.4* 2.8* 2.9* 2.8* 3.0*     Lipase     Component Value Date/Time   LIPASE 34 07/25/2018 1032     Medications: . enoxaparin  (LOVENOX) injection  1 mg/kg (Adjusted) Subcutaneous BID  . furosemide  40 mg Oral Once  . ipratropium-albuterol  3 mL Nebulization TID  . loratadine  10 mg Oral Daily  . metoprolol tartrate  12.5 mg Oral BID  . mirtazapine  7.5 mg Oral QHS  . pantoprazole  40 mg Oral Daily  . potassium chloride  40 mEq Oral Daily  . risperiDONE  0.25 mg Oral QPM  . sertraline  125 mg Oral QHS  . simethicone  80 mg Oral TID WC    Assessment/Plan Dementia H/o CVA HTN Dysphagia H/o DVT on xarelto - being held Seizure d/o GERD Bacteremia, E. Coli/Enterobacter - on zosyn Possible aspiration PNA UTI Hypokalemia -being replaced  Elevated LFTs HIDA scan was unhelpful as the radiotracer did not excrete from the liver c/w hepatic dysfunction. Her LFTs are still mildly elevated today. This could be related to liver dysfunction, but could still be from her gallbladder. Her exam is not impressive today. She is AF, normal WBC. Her US shows no gallstones. I do not think at this time we need to consult IR for a perc chole drain. I think we can try to treat her with abx alone for now and see how she does. The daughter was concerned about her liver dysfunction. We discussed her imaging  showing hepatic steatosis and how this can cause some hepatic dysfunction. Her hepatitis panel is negative. She has asked for GI to give an opinion about her liver and make sure this isn't contributing to her symptoms. She could have such dysfunction that she has stasis of the biliary tree causing her LFTs to rise and for bacteria to form. Either way, for now I think we can manage conservatively as the family is for "treat what we can, but aim for comfort ultimately." They definitely do NOT want any type of surgery, which I think is totally reasonable. We will continue to follow. I have discussed this case with GI who is going to see her as well.  -GI evaluation: LFT elevation most likely related to sepsis, would continue  to trend LFTs, no role for ERCP, no plan for additional serologic work-up; GI signed off  -   Conservative antibiotic Rx recommended   FEN -IV fluids; per swallow study, MBS scheduled for today; NPO for now VTE -Lovenox, therapeutic ID -Unasyn x1 6/13; zosyn 6/14-6/16; Rocephin 6/16>>  Plan: From our standpoint I would continue antibiotic therapy.  Labs continue to improve, and physical exam continues to improve.  We will be available if needed please call if we can be of further assistance.      LOS: 3 days    Karsten Vaughn 06/23/2019 Please see Amion

## 2019-06-23 NOTE — Care Management Important Message (Signed)
Important Message  Patient Details  IM Letter given to Gabriel Earing RN Case Manager to present to the Patient Name: Erica [name] MRN: [id number] Date of Birth: 1927/04/02   Medicare Important Message Given:  Yes     Kerin Salen 06/23/2019, 10:28 AM

## 2019-06-24 ENCOUNTER — Encounter: Payer: Self-pay | Admitting: Nurse Practitioner

## 2019-06-24 ENCOUNTER — Non-Acute Institutional Stay (SKILLED_NURSING_FACILITY): Payer: Medicare PPO | Admitting: Nurse Practitioner

## 2019-06-24 DIAGNOSIS — F0151 Vascular dementia with behavioral disturbance: Secondary | ICD-10-CM | POA: Diagnosis not present

## 2019-06-24 DIAGNOSIS — F331 Major depressive disorder, recurrent, moderate: Secondary | ICD-10-CM | POA: Diagnosis not present

## 2019-06-24 DIAGNOSIS — G40909 Epilepsy, unspecified, not intractable, without status epilepticus: Secondary | ICD-10-CM

## 2019-06-24 DIAGNOSIS — R1310 Dysphagia, unspecified: Secondary | ICD-10-CM

## 2019-06-24 DIAGNOSIS — R6520 Severe sepsis without septic shock: Secondary | ICD-10-CM

## 2019-06-24 DIAGNOSIS — Z7901 Long term (current) use of anticoagulants: Secondary | ICD-10-CM | POA: Diagnosis not present

## 2019-06-24 DIAGNOSIS — K5901 Slow transit constipation: Secondary | ICD-10-CM | POA: Diagnosis not present

## 2019-06-24 DIAGNOSIS — E876 Hypokalemia: Secondary | ICD-10-CM

## 2019-06-24 DIAGNOSIS — K219 Gastro-esophageal reflux disease without esophagitis: Secondary | ICD-10-CM

## 2019-06-24 DIAGNOSIS — A4150 Gram-negative sepsis, unspecified: Secondary | ICD-10-CM

## 2019-06-24 DIAGNOSIS — F01518 Vascular dementia, unspecified severity, with other behavioral disturbance: Secondary | ICD-10-CM

## 2019-06-24 DIAGNOSIS — R627 Adult failure to thrive: Secondary | ICD-10-CM

## 2019-06-24 DIAGNOSIS — I10 Essential (primary) hypertension: Secondary | ICD-10-CM

## 2019-06-24 LAB — GLUCOSE, CAPILLARY: Glucose-Capillary: 113 mg/dL — ABNORMAL HIGH (ref 70–99)

## 2019-06-24 MED ORDER — AMOXICILLIN-POT CLAVULANATE 875-125 MG PO TABS
1.0000 | ORAL_TABLET | Freq: Two times a day (BID) | ORAL | 0 refills | Status: AC
Start: 2019-06-24 — End: 2019-06-29

## 2019-06-24 MED ORDER — LORAZEPAM 0.5 MG PO TABS: 0.5000 mg | ORAL_TABLET | Freq: Two times a day (BID) | ORAL | 0 refills | Status: DC | PRN

## 2019-06-24 NOTE — TOC Progression Note (Signed)
Transition of Care [facility]) - Progression Note    Patient Details  Name: Erica [name] MRN: [id number] Date of Birth: 1927-03-14  [facility]) CM/SW Contact  Purcell Mouton, RN Phone Number: 06/24/2019, 11:35 AM  Clinical Narrative:    [facility] Transportation called.   Expected Discharge Plan: [place] Barriers to Discharge: No Barriers Identified  Expected Discharge Plan and Services Expected Discharge Plan: [place] arrangements for the past 2 months: [place] Expected Discharge Date: 06/24/19                                     Social Determinants of Health (SDOH) Interventions    Readmission Risk Interventions No flowsheet data found.

## 2019-06-24 NOTE — NC FL2 (Signed)
[place] LEVEL OF CARE SCREENING TOOL     IDENTIFICATION  Patient Name: Erica [name] Birthdate: [birth year] Sex: female Admission Date (Current Location): 06/19/2019  [facility] and [place] Number:  Herbalist and Address:  [facility],  [place], [place]      Provider Number: [id number]  Attending Physician Name and Address:  Damita Lack, MD  Relative Name and Phone Number:  Sonnet Rizor  [phone] daughter    Current Level of Care: Hospital Recommended Level of Care: [place] Prior Approval Number:    Date Approved/Denied:   PASRR Number: [id number]  Discharge Plan: SNF    Current Diagnoses: Patient Active Problem List   Diagnosis Date Noted  . Elevated AST (SGOT)   . Elevated ALT measurement   . Elevated bilirubin   . Severe sepsis with acute organ dysfunction due to Gram negative bacteria ([place]) 06/20/2019  . Acute cholecystitis 06/20/2019  . Left ureteral stone 06/20/2019  . Lactic acidosis 06/20/2019  . Gram-negative sepsis with organ dysfunction ([place]) 06/20/2019  . Aspiration pneumonia of both lower lobes due to gastric secretions ([place]) 06/20/2019  . Allergic rhinitis 02/21/2019  . Rash 01/26/2019  . Urinary incontinence 01/26/2019  . Slow transit constipation 01/19/2019  . Compression fracture of T9 vertebra ([place]) 07/26/2018  . Seizure disorder ([place]) 07/25/2018  . Adult failure to thrive 06/11/2017  . Goals of care, counseling/discussion 03/19/2017  . Chronic anticoagulation 01/09/2017  . Gait abnormality 12/08/2016  . Psychosis ([place]) 08/21/2016  . Hallucination 08/14/2016  . Vascular dementia with behavior disturbance ([place]) 07/22/2016  . Dysphagia following cerebrovascular disease   . 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 Nontraumatic intracranial hemorrhage ([place])   . 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   . Major depressive  disorder, recurrent ([place])   . SCC (squamous cell carcinoma), scalp/neck 11/10/2013  . GERD without esophagitis 09/29/2012  . Protein C deficiency ([place]) 06/14/2012  . Protein S deficiency ([place]) 06/14/2012  . DNR (do not resuscitate) 06/14/2012  . Essential hypertension 03/24/2012  . CVA (cerebral vascular accident) ([place]) 03/24/2012  . Osteoporosis 03/24/2012  . Macular degeneration 03/24/2012  . Aphasia 03/24/2012  . Dyslipidemia 03/24/2012    Orientation RESPIRATION BLADDER Height & Weight     Self  O2 (2L O2 [place]) Incontinent Weight: 70 kg Height:  5\' 3"  (160 cm)  BEHAVIORAL SYMPTOMS/MOOD NEUROLOGICAL BOWEL NUTRITION STATUS      Incontinent Diet (NPO)  AMBULATORY STATUS COMMUNICATION OF NEEDS Skin   Total Care   Normal                       Personal Care Assistance Level of Assistance  Bathing, Feeding, Dressing, Total care Bathing Assistance: Maximum assistance Feeding assistance: Maximum assistance Dressing Assistance: Maximum assistance Total Care Assistance: Maximum assistance   Functional Limitations Info  Sight, Hearing, Speech Sight Info: Impaired Hearing Info: Impaired Speech Info: Impaired    SPECIAL CARE FACTORS FREQUENCY  PT (By licensed PT), OT (By licensed OT)     PT Frequency: Eval and Treat OT Frequency: Eval and Treat            Contractures Contractures Info: Not present    Additional Factors Info  Code Status, Allergies Code Status Info: DNR Allergies Info: Hydrocodone, Morphine And Related, Actonel Risedronate Sodium, Darvon Propoxyphene Hcl, Oxycodone Hcl, Pneumovax Pneumococcal Polysaccharide Vaccine  Current Medications (06/24/2019):  This is the current hospital active medication list Current Facility-Administered Medications  Medication Dose Route Frequency Provider Last Rate Last Admin  . acetaminophen (TYLENOL) tablet 650 mg  650 mg Oral Q6H PRN Vernelle Emerald, MD   650 mg at 06/22/19 1027   Or  . acetaminophen  (TYLENOL) suppository 650 mg  650 mg Rectal Q6H PRN Shalhoub, Sherryll Burger, MD      . ceFAZolin (ANCEF) IVPB 2g/100 mL premix  2 g Intravenous Q8H Amin, Ankit Chirag, MD 200 mL/hr at 06/24/19 0453 2 g at 06/24/19 0453  . dextrose 10 % infusion   Intravenous Continuous Amin, Ankit Chirag, MD 25 mL/hr at 06/24/19 0600 Rate Verify at 06/24/19 0600  . enoxaparin (LOVENOX) injection 60 mg  1 mg/kg (Adjusted) Subcutaneous BID Eudelia Bunch, RPH   60 mg at 06/23/19 2213  . furosemide (LASIX) tablet 40 mg  40 mg Oral Once Amin, Ankit Chirag, MD      . ipratropium-albuterol (DUONEB) 0.5-2.5 (3) MG/3ML nebulizer solution 3 mL  3 mL Nebulization Q4H PRN Amin, Ankit Chirag, MD   3 mL at 06/22/19 1941  . ipratropium-albuterol (DUONEB) 0.5-2.5 (3) MG/3ML nebulizer solution 3 mL  3 mL Nebulization TID Amin, Ankit Chirag, MD   3 mL at 06/24/19 0749  . levETIRAcetam (KEPPRA) 250 mg in sodium chloride 0.9 % 100 mL IVPB  250 mg Intravenous Q12H Damita Lack, MD   Stopped at 06/23/19 2230  . loratadine (CLARITIN) tablet 10 mg  10 mg Oral Daily Shalhoub, Sherryll Burger, MD   10 mg at 06/22/19 1027  . LORazepam (ATIVAN) injection 0.5 mg  0.5 mg Intravenous BID PRN Lang Snow, FNP   0.5 mg at 06/21/19 1752  . metoprolol tartrate (LOPRESSOR) tablet 12.5 mg  12.5 mg Oral BID Vernelle Emerald, MD   12.5 mg at 06/22/19 2225  . mirtazapine (REMERON) tablet 7.5 mg  7.5 mg Oral QHS Shalhoub, Sherryll Burger, MD   7.5 mg at 06/21/19 2246  . ondansetron (ZOFRAN) tablet 4 mg  4 mg Oral Q6H PRN Shalhoub, Sherryll Burger, MD       Or  . ondansetron [facility]) injection 4 mg  4 mg Intravenous Q6H PRN Shalhoub, Sherryll Burger, MD      . pantoprazole (PROTONIX) EC tablet 40 mg  40 mg Oral Daily Shalhoub, Sherryll Burger, MD   40 mg at 06/22/19 1027  . polyethylene glycol (MIRALAX / GLYCOLAX) packet 17 g  17 g Oral Daily PRN Amin, Ankit Chirag, MD      . potassium chloride 20 MEQ/15ML (10%) solution 40 mEq  40 mEq Oral Daily Amin, Ankit Chirag, MD      .  risperiDONE (RISPERDAL) tablet 0.25 mg  0.25 mg Oral QPM Shalhoub, Sherryll Burger, MD   0.25 mg at 06/21/19 2246  . senna-docusate (Senokot-S) tablet 2 tablet  2 tablet Oral QHS PRN Amin, Ankit Chirag, MD      . sertraline (ZOLOFT) tablet 125 mg  125 mg Oral QHS Amin, Ankit Chirag, MD   125 mg at 06/21/19 2246  . simethicone (MYLICON) chewable tablet 80 mg  80 mg Oral TID WC Shalhoub, Sherryll Burger, MD   80 mg at 06/22/19 1026     Discharge Medications: Please see discharge summary for a list of discharge medications.  Relevant Imaging Results:  Relevant Lab Results:   Additional Information [id number]  Purcell Mouton, RN

## 2019-06-24 NOTE — Assessment & Plan Note (Signed)
Memory care unit [facility], comfort care

## 2019-06-24 NOTE — Assessment & Plan Note (Signed)
Depression/axiety, takes Lorazepam 0.5mg  bid prn, desires to stop Mirtazapine, Risperdal, Sertraline.

## 2019-06-24 NOTE — Assessment & Plan Note (Signed)
Stable, continue Keppra 

## 2019-06-24 NOTE — Progress Notes (Signed)
Report called to Andee Poles , RN. Pt in route to facility. Daughters called and updated. SRP, RNSRP, RN

## 2019-06-24 NOTE — Assessment & Plan Note (Signed)
HPOA: will try Kcl 89meq qd if tolerated, BMP one wk

## 2019-06-24 NOTE — Progress Notes (Signed)
Spoke with Delsa Grana pt daughter to update on pt departure from [facility], RN

## 2019-06-24 NOTE — Assessment & Plan Note (Signed)
Blood pressure is controlled, continue Metoprolol. 

## 2019-06-24 NOTE — Assessment & Plan Note (Signed)
taking Bisacodyl 10mg  suppository qd prn. Desires to be off MiraLax

## 2019-06-24 NOTE — Assessment & Plan Note (Signed)
Stable, continue Omeprazole.  

## 2019-06-24 NOTE — Assessment & Plan Note (Signed)
Goal of care is comfort measures, Hospice service.

## 2019-06-24 NOTE — Assessment & Plan Note (Signed)
DVT, protein S/C deficiency, continue  Xarelto. Hx hemorrhagic CVA, right sided weakness

## 2019-06-24 NOTE — Assessment & Plan Note (Signed)
Comfort feeding.  °

## 2019-06-24 NOTE — TOC Progression Note (Signed)
Transition of Care [facility]) - Progression Note    Patient Details  Name: Ica Daye MRN: [id number] Date of Birth: May 14, 1927  Transition of Care [facility]) CM/SW Contact  Purcell Mouton, RN Phone Number: 06/24/2019, 10:28 AM  Clinical Narrative:    A call to [facility] was made, they are ready for pt to return. Pt will transport by [facility]/daughter.    Expected Discharge Plan: [place] Barriers to Discharge: No Barriers Identified  Expected Discharge Plan and Services Expected Discharge Plan: [place] arrangements for the past 2 months: [place] Expected Discharge Date: 06/24/19                                     Social Determinants of Health (SDOH) Interventions    Readmission Risk Interventions No flowsheet data found.

## 2019-06-24 NOTE — Assessment & Plan Note (Signed)
acute cholecystitis, elevated AST, ALT, Bilirubin, aspiration bibasilar PNA, UTI, VRE Klebsiella, E. Coli sepsis, treated with Zosyn, GI recommended conservative management.   Will complete Augmentin 875/125 mg bid x 5 days, CBC/BMP one week in SNF [facility] CBC in one week.

## 2019-06-24 NOTE — Progress Notes (Signed)
Location:   [facility] Room Number: 109 Place of Service:  SNF (31) Provider:  Branden Vine, Lennie Odor NP   Lorin Gawron X, NP  Patient Care Team: Sharnita Bogucki X, NP as PCP - General (Internal Medicine) Sharlisa Hollifield X, NP as Nurse Practitioner (Internal Medicine) Dunn, Warnell Bureau as Consulting Physician (Optometry) Monna Fam, MD as Consulting Physician (Ophthalmology) Lucky Cowboy, Gordy Clement, NP as Nurse Practitioner (Hospice and Palliative Medicine) Virgie Dad, MD as Consulting Physician (Internal Medicine)  Extended Emergency Contact Information Primary Emergency Contact: [facility] Address: [address]          [place], [place] [address] [name] of [place] Phone: [phone] Work Phone: [phone] Mobile Phone: [phone] Relation: Daughter Secondary Emergency Contact: Jenne Campus Home Phone: [phone] Relation: Daughter  Code Status:  DNR Goals of care: Advanced Directive information Advanced Directives 06/19/2019  Does Patient Have a Medical Advance Directive? Yes  Type of Paramedic of [place];Out of facility DNR (pink MOST or yellow form)  Does patient want to make changes to medical advance directive? No - Patient declined  Copy of [place] in Chart? Yes - validated most recent copy scanned in chart (See row information)  Pre-existing out of facility DNR order (yellow form or pink MOST form) Yellow form placed in chart (order not valid for inpatient use)     Chief Complaint  Patient presents with  . Acute Visit    medication review    HPI:  Pt is a 86 y.o. female seen today for an acute visit for serum K 2.8 06/23/19, reported the patient is NPO x 2 day prior to dc. Her gaol of care is to return SNF [facility] for comfort care, Hospice service.    The patient was hospitalized 06/19/19-06/24/19 for acute cholecystitis, elevated AST, ALT, Bilirubin, aspiration bibasilar PNA, UTI,  VRE Klebsiella, E. Coli sepsis, treated with Zosyn, GI recommended conservative management.   Will complete Augmentin 875/125 mg bid x 5 days, CBC/BMP one week in SNF [facility]  Constipation, taking Bisacodyl 10mg  suppository qd prn. Desires to be off MiraLax  Dysphagia, comfort feeding.   Dementia, vascular, behavioral issues.   DVT, protein S/C deficiency, on Xarelto  Hx hemorrhagic CVA, right sided weakness  Seizure disorder, taking Keppra 25mg  bid  GERD, takes Omeprazole.   HTN controlled blood pressure, on Metoprolol 12.5mg  bid  Left ureteral stone  Depression/axiety, takes Lorazepam 0.5mg  bid prn, desires to stop Mirtazapine, Risperdal, Sertraline.   Past Medical History:  Diagnosis Date  . Acute encephalopathy   . Aphasia following unspecified cerebrovascular disease   . 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 Depression   . Dysphagia following cerebrovascular disease   . Hepatic steatosis   . Hydroureteronephrosis   . Hypertension   . Insomnia   . Macular degeneration   . Major depressive disorder, recurrent ([place])   . Neuralgia and neuritis, unspecified ([place])   . Nontraumatic intracranial hemorrhage ([place])   . Stroke ([place])   . 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   . 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 ABDOMINAL HYSTERECTOMY    . APPENDECTOMY    . BREAST SURGERY    . MASTECTOMY Right     Allergies  Allergen Reactions  . Hydrocodone Nausea And Vomiting  . Morphine And Related Nausea And Vomiting  .  Actonel [Risedronate Sodium] Other (See Comments)    unknown  . Darvon [Propoxyphene Hcl] Other (See Comments)    unknown  . Oxycodone Hcl Other (See Comments)    unknown  . Pneumovax [Pneumococcal Polysaccharide Vaccine] Other (See Comments)    unknown    Allergies as of 06/24/2019      Reactions   Hydrocodone Nausea And Vomiting    Morphine And Related Nausea And Vomiting   Actonel [risedronate Sodium] Other (See Comments)   unknown   Darvon [propoxyphene Hcl] Other (See Comments)   unknown   Oxycodone Hcl Other (See Comments)   unknown   Pneumovax [pneumococcal Polysaccharide Vaccine] Other (See Comments)   unknown      Medication List       Accurate as of June 24, 2019 11:59 PM. If you have any questions, ask your nurse or doctor.        STOP taking these medications   bisacodyl 10 MG suppository Commonly known as: DULCOLAX   hydrocortisone 2.5 % lotion   levETIRAcetam 100 MG/ML solution Commonly known as: KEPPRA   loratadine 10 MG tablet Commonly known as: CLARITIN   LORazepam 0.5 MG tablet Commonly known as: ATIVAN   metoprolol tartrate 25 MG tablet Commonly known as: LOPRESSOR   mirtazapine 15 MG tablet Commonly known as: REMERON   nystatin cream Commonly known as: MYCOSTATIN   nystatin powder Generic drug: nystatin   omeprazole 10 MG capsule Commonly known as: PRILOSEC   polyethylene glycol 17 g packet Commonly known as: MIRALAX / GLYCOLAX   risperiDONE 0.25 MG tablet Commonly known as: RISPERDAL   Rivaroxaban 15 MG Tabs tablet Commonly known as: XARELTO   sertraline 100 MG tablet Commonly known as: ZOLOFT   sertraline 25 MG tablet Commonly known as: ZOLOFT   simethicone 80 MG chewable tablet Commonly known as: MYLICON   triamcinolone cream 0.5 % Commonly known as: KENALOG     TAKE these medications   acetaminophen 325 MG tablet Commonly known as: TYLENOL Take 650 mg by mouth every 4 (four) hours as needed for mild pain.   amoxicillin-clavulanate 875-125 MG tablet Commonly known as: Augmentin Take 1 tablet by mouth every 12 (twelve) hours for 5 days.   Potassium Chloride ER 20 MEQ Tbcr Take by mouth. Give 2 tabs to equal 40 mEq Once A Day   Tubersol 5 UNIT/0.1ML injection Generic drug: tuberculin Inject into the skin once.      ROS was provided with  assistance of staff and family.  Review of Systems  Constitutional: Positive for activity change, appetite change and fatigue. Negative for chills, diaphoresis and fever.  HENT: Positive for hearing loss and trouble swallowing. Negative for congestion.   Eyes: Negative for visual disturbance.  Respiratory: Positive for cough, choking and shortness of breath. Negative for chest tightness.        O2 2lpm via [place]  Cardiovascular: Negative for chest pain, palpitations and leg swelling.  Gastrointestinal: Negative for abdominal distention, abdominal pain, constipation, diarrhea, nausea and vomiting.  Genitourinary: Negative for difficulty urinating, dysuria and urgency.       Incontinent of urine.   Musculoskeletal: Positive for arthralgias. Negative for neck stiffness.  Skin: Negative for color change.  Neurological: Positive for speech difficulty and weakness. Negative for seizures, facial asymmetry, light-headedness and headaches.       Dementia. Expressive aphasia. Right side weakness with muscle strength 5/5  Psychiatric/Behavioral: Positive for agitation, behavioral problems, confusion and dysphoric mood. Negative for hallucinations and sleep disturbance.  Immunization History  Administered Date(s) Administered  . Influenza Whole 10/19/2017  . Influenza, High Dose Seasonal PF 10/08/2018  . Influenza-Unspecified 11/20/2013, 09/26/2014, 10/24/2015, 11/03/2016  . Moderna SARS-COVID-2 Vaccination 01/08/2019  . Tdap 09/19/2016  . Zoster 01/07/2012   Pertinent  Health Maintenance Due  Topic Date Due  . INFLUENZA VACCINE  08/07/2019  . DEXA SCAN  Discontinued   Fall Risk  09/15/2017 09/05/2016  Falls in the past year? No No   Functional Status Survey:    Vitals:   06/24/19 1452  BP: 127/68  Pulse: (!) 105  Resp: (!) 22  Temp: (!) 101.4 F (38.6 C)  SpO2: 90%  Weight: 157 lb 1.6 oz (71.3 kg)  Height: 5\' 3"  (1.6 m)   Body mass index is 27.83 kg/m. Physical Exam Vitals and  nursing note reviewed.  HENT:     Head: Normocephalic and atraumatic.     Mouth/Throat:     Mouth: Mucous membranes are dry.  Eyes:     Extraocular Movements: Extraocular movements intact.     Conjunctiva/sclera: Conjunctivae normal.     Pupils: Pupils are equal, round, and reactive to light.  Cardiovascular:     Rate and Rhythm: Regular rhythm. Tachycardia present.     Heart sounds: No murmur heard.   Pulmonary:     Breath sounds: No wheezing, rhonchi or rales.     Comments: O2 via [place] Abdominal:     General: Bowel sounds are normal. There is no distension.     Palpations: Abdomen is soft.     Tenderness: There is no abdominal tenderness. There is no right CVA tenderness, left CVA tenderness, guarding or rebound.  Musculoskeletal:     Cervical back: Normal range of motion and neck supple.     Right lower leg: No edema.     Left lower leg: No edema.     Comments: Trace edema BLE  Skin:    General: Skin is warm and dry.  Neurological:     Mental Status: She is alert.     Comments: Not oriented to person, place, and times. Followed simple direction to squeeze my fingers, but not turn loose my fingers, yelling out when she was told to during my examination.   Psychiatric:     Comments: Confused.      Labs reviewed: Recent Labs    06/21/19 0441 06/22/19 0505 06/23/19 0435  NA 144 139 135  K 3.6 2.8* 2.8*  CL 114* 112* 103  CO2 20* 19* 23  GLUCOSE 93 104* 104*  BUN 11 7* 6*  CREATININE 1.01* 0.82 0.77  CALCIUM 7.2* 7.4* 7.5*  MG 1.8 1.8 2.0   Recent Labs    06/21/19 0441 06/22/19 0505 06/23/19 0435  AST 123* 62* 38  ALT 132* 98* 74*  ALKPHOS 102 116 144*  BILITOT 2.9* 1.8* 1.5*  PROT 5.5* 5.5* 6.1*  ALBUMIN 2.9* 2.8* 3.0*   Recent Labs    06/19/19 2200 06/19/19 2200 06/20/19 0555 06/20/19 0555 06/20/19 0658 06/20/19 0658 06/21/19 0441 06/22/19 0505 06/23/19 0435  WBC 7.7   < > SPECIMEN CLOTTED   < > 14.4*   < > 9.3 8.4 7.0  NEUTROABS 7.0  --   SPECIMEN CLOTTED  --  12.9*  --   --   --   --   HGB 15.2*   < > SPECIMEN CLOTTED   < > 12.9   < > 11.6* 11.4* 12.3  HCT 48.4*   < > SPECIMEN CLOTTED   < >  42.3   < > 37.0 35.9* 38.6  MCV 96.2   < > SPECIMEN CLOTTED   < > 100.7*   < > 95.4 93.7 93.2  PLT 222   < > SPECIMEN CLOTTED   < > 210   < > 167 163 158   < > = values in this interval not displayed.   Lab Results  Component Value Date   TSH 2.36 06/09/2019   Lab Results  Component Value Date   HGBA1C 5.5 12/02/2016   Lab Results  Component Value Date   CHOL 174 10/21/2016   HDL 45 10/21/2016   LDLCALC 103 10/21/2016   TRIG 158 10/21/2016    Significant Diagnostic Results in last 30 days:  NM Hepatobiliary Liver Func  Result Date: 06/20/2019 CLINICAL DATA:  Concern for cholecystitis on CT exam. Negative ultrasound exam. No biliary duct dilatation on CT exam. EXAM: NUCLEAR MEDICINE HEPATOBILIARY IMAGING TECHNIQUE: Sequential images of the abdomen were obtained out to 60 minutes following intravenous administration of radiopharmaceutical. RADIOPHARMACEUTICALS:  5.4 mCi Tc-33m  Choletec IV COMPARISON:  None. FINDINGS: Mild delay in radiotracer clearance from the blood pool. There is uniform uptake within liver. The common bile duct is not identified over a 2 hour exam. No activity in the bowel. The gallbladder subsequently does not fill. IMPRESSION: No excretion of radiotracer into the bowel. Bile duct obstruction is not favored as the biliary tree with normal on comparison ultrasound and CT. Therefore favor hepatic dysfunction such as hepatitis. Electronically Signed   By: Suzy Bouchard M.D.   On: 06/20/2019 13:00   CT ABDOMEN PELVIS W CONTRAST  Result Date: 06/20/2019 CLINICAL DATA:  Biliary colic. EXAM: CT ABDOMEN AND PELVIS WITH CONTRAST TECHNIQUE: Multidetector CT imaging of the abdomen and pelvis was performed using the standard protocol following bolus administration of intravenous contrast. CONTRAST:  42mL OMNIPAQUE IOHEXOL  300 MG/ML  SOLN COMPARISON:  July 13, 2016 FINDINGS: Lower chest: Significant bibasilar airspace disease is noted favored to represent a combination of both atelectasis and consolidation, especially at the right lung base.The heart is enlarged. Hepatobiliary: There is decreased hepatic attenuation suggestive of hepatic steatosis. There appears to be some gallbladder wall thickening and adjacent inflammatory changes. There appears to be pericholecystic free fluid.There is no biliary ductal dilation. Pancreas: Normal contours without ductal dilatation. No peripancreatic fluid collection. Spleen: Unremarkable. Adrenals/Urinary Tract: --Adrenal glands: Unremarkable. --Right kidney/ureter: No hydronephrosis or radiopaque kidney stones. --Left kidney/ureter: There is an at least partially duplicated left-sided collecting system. There is a nonobstructing stone measuring approximately 6 mm in the distal left ureter, nearly at the left UVJ (axial series 2, image 73). --Urinary bladder: Unremarkable. Stomach/Bowel: --Stomach/Duodenum: No hiatal hernia or other gastric abnormality. Normal duodenal course and caliber. --Small bowel: Unremarkable. --Colon: There is a very large amount of stool the level the rectum. There is scattered colonic diverticula without CT evidence for diverticulitis. --Appendix: Not visualized. No right lower quadrant inflammation or free fluid. Vascular/Lymphatic: Atherosclerotic calcification is present within the non-aneurysmal abdominal aorta, without hemodynamically significant stenosis. --No retroperitoneal lymphadenopathy. --No mesenteric lymphadenopathy. --No pelvic or inguinal lymphadenopathy. Reproductive: Status post hysterectomy. No adnexal mass. Other: No ascites or free air. The abdominal wall is normal. Musculoskeletal. Again noted is a compression fracture of the T9 vertebral body. There has been significant interval height loss since the patient's prior study dated 07/25/2018. There is  no new acute appearing compression fracture. IMPRESSION: 1. Findings are concerning for acute cholecystitis. As this is discordant with the patient's  recent ultrasound, correlation with laboratory studies and HIDA scan is recommended. 2. There is a large amount of stool the level of the rectum. 3. There is a nonobstructing stone measuring approximately 6 mm in the distal left ureter, nearly at the left UVJ. 4. Significant bibasilar airspace disease favored to represent a combination of both atelectasis and consolidation, especially at the right lung base. 5. Hepatic steatosis. 6. Cardiomegaly. 7. Again noted is a T9 compression fracture with significant interval height loss since 2020. Aortic Atherosclerosis (ICD10-I70.0). Electronically Signed   By: Constance Holster M.D.   On: 06/20/2019 00:26   DG Chest Port 1 View  Result Date: 06/22/2019 CLINICAL DATA:  Dyspnea EXAM: PORTABLE CHEST 1 VIEW COMPARISON:  06/19/2019 FINDINGS: Cardiac shadow is stable. Mild vascular congestion is again seen and slightly increased. Mild interstitial edema is now noted. Patchy increased density in the left base is noted consistent with mild atelectatic change. No bony abnormality is noted. IMPRESSION: Persistent and slightly increased vascular congestion with mild edema. Mild left basilar atelectasis. Electronically Signed   By: Inez Catalina M.D.   On: 06/22/2019 12:43   DG Chest Port 1 View  Result Date: 06/19/2019 CLINICAL DATA:  Shortness of breath and unresponsiveness EXAM: PORTABLE CHEST 1 VIEW COMPARISON:  07/25/2018 FINDINGS: Cardiac shadow is enlarged but stable. Aortic calcifications are again seen. Overall inspiratory effort is poor however no focal infiltrate is seen. Mild central vascular prominence is noted. No bony abnormality is noted. IMPRESSION: Mild central vascular congestion.  No focal infiltrate is noted. Electronically Signed   By: Inez Catalina M.D.   On: 06/19/2019 22:22   ECHOCARDIOGRAM  COMPLETE  Result Date: 06/20/2019    ECHOCARDIOGRAM REPORT   Patient Name:   Erica [name] Date of Exam: 06/20/2019 Medical Rec #:  [id number]     Height:       63.0 in Accession #:    [id number]    Weight:       154.3 lb Date of Birth:  1927-06-16     BSA:          1.732 m Patient Age:    89 years      BP:           149/60 mmHg Patient Gender: F             HR:           93 bpm. Exam Location:  Inpatient Procedure: 2D Echo, Cardiac Doppler and Color Doppler Indications:    CHF-Acute Diastolic 671.24 / P80.99  History:        Patient has no prior history of Echocardiogram examinations.                 Stroke; Risk Factors:Hypertension and Non-Smoker. Sepsis. GERD.  Sonographer:    Vickie Epley RDCS Referring Phys: [id number] [place]  Sonographer Comments: Image acquisition challenging due to respiratory motion. IMPRESSIONS  1. Left ventricular ejection fraction, by estimation, is 60 to 65%. The left ventricle has normal function. The left ventricle has no regional wall motion abnormalities. There is mild left ventricular hypertrophy. Left ventricular diastolic parameters are indeterminate.  2. Right ventricular systolic function is normal. The right ventricular size is normal. There is normal pulmonary artery systolic pressure. The estimated right ventricular systolic pressure is 53.9 mmHg.  3. The mitral valve is normal in structure. No evidence of mitral valve regurgitation.  4. The aortic valve was not well visualized. Aortic valve regurgitation is not visualized. No  aortic stenosis is present.  5. The inferior vena cava is normal in size with greater than 50% respiratory variability, suggesting right atrial pressure of 3 mmHg. FINDINGS  Left Ventricle: Left ventricular ejection fraction, by estimation, is 60 to 65%. The left ventricle has normal function. The left ventricle has no regional wall motion abnormalities. The left ventricular internal cavity size was small. There is mild left ventricular  hypertrophy. Left ventricular diastolic parameters are indeterminate. Right Ventricle: The right ventricular size is normal. Right vetricular wall thickness was not assessed. Right ventricular systolic function is normal. There is normal pulmonary artery systolic pressure. The tricuspid regurgitant velocity is 2.37 m/s, and with an assumed right atrial pressure of 3 mmHg, the estimated right ventricular systolic pressure is 37.1 mmHg. Left Atrium: Left atrial size was normal in size. Right Atrium: Right atrial size was normal in size. Pericardium: Trivial pericardial effusion is present. Mitral Valve: The mitral valve is normal in structure. No evidence of mitral valve regurgitation. Tricuspid Valve: The tricuspid valve is normal in structure. Tricuspid valve regurgitation is trivial. Aortic Valve: The aortic valve was not well visualized. Aortic valve regurgitation is not visualized. No aortic stenosis is present. Pulmonic Valve: The pulmonic valve was not well visualized. Pulmonic valve regurgitation is trivial. Aorta: The aortic root and ascending aorta are structurally normal, with no evidence of dilitation. Venous: The inferior vena cava is normal in size with greater than 50% respiratory variability, suggesting right atrial pressure of 3 mmHg. IAS/Shunts: The interatrial septum was not well visualized.  LEFT VENTRICLE PLAX 2D LVIDd:         3.10 cm LVIDs:         2.00 cm LV PW:         0.90 cm LV IVS:        0.90 cm LVOT diam:     1.70 cm LV SV:         48 LV SV Index:   28 LVOT Area:     2.27 cm  RIGHT VENTRICLE TAPSE (M-mode): 2.1 cm LEFT ATRIUM             Index       RIGHT ATRIUM           Index LA diam:        3.40 cm 1.96 cm/m  RA Area:     11.60 cm LA Vol (A2C):   26.1 ml 15.07 ml/m RA Volume:   18.80 ml  10.86 ml/m LA Vol (A4C):   23.0 ml 13.28 ml/m LA Biplane Vol: 26.3 ml 15.19 ml/m  AORTIC VALVE LVOT Vmax:   109.00 cm/s LVOT Vmean:  77.800 cm/s LVOT VTI:    0.213 m  AORTA Ao Root diam: 2.90 cm  TRICUSPID VALVE TR Peak grad:   22.5 mmHg TR Vmax:        237.00 cm/s  SHUNTS Systemic VTI:  0.21 m Systemic Diam: 1.70 cm Oswaldo Milian MD Electronically signed by Oswaldo Milian MD Signature Date/Time: 06/20/2019/7:41:35 PM    Final    US Abdomen Limited RUQ  Result Date: 06/19/2019 CLINICAL DATA:  Transaminitis EXAM: ULTRASOUND ABDOMEN LIMITED RIGHT UPPER QUADRANT COMPARISON:  None. FINDINGS: Gallbladder: No gallstones or wall thickening visualized. No sonographic Murphy sign noted by sonographer. Common bile duct: Diameter: Normal caliber, 6 mm. Liver: Increased echotexture compatible with fatty infiltration. No focal abnormality or biliary ductal dilatation. Portal vein is patent on color Doppler imaging with normal direction of blood flow towards the liver. Other:  None. IMPRESSION: Fatty infiltration of the liver. No acute findings. Electronically Signed   By: Rolm Baptise M.D.   On: 06/19/2019 23:29    Assessment/Plan Hypokalemia HPOA: will try Kcl 39meq qd if tolerated, BMP one wk  Severe sepsis with acute organ dysfunction due to Gram negative bacteria (HCC) acute cholecystitis, elevated AST, ALT, Bilirubin, aspiration bibasilar PNA, UTI, VRE Klebsiella, E. Coli sepsis, treated with Zosyn, GI recommended conservative management.   Will complete Augmentin 875/125 mg bid x 5 days, CBC/BMP one week in SNF [facility] CBC in one week.   Slow transit constipation taking Bisacodyl 10mg  suppository qd prn. Desires to be off MiraLax   Vascular dementia with behavior disturbance ([place]) Memory care unit [facility], comfort care  Seizure disorder (HCC) Stable, continue Keppra.   Dysphagia Comfort feeding.   Major depressive disorder, recurrent (HCC) Depression/axiety, takes Lorazepam 0.5mg  bid prn, desires to stop Mirtazapine, Risperdal, Sertraline.   Chronic anticoagulation DVT, protein S/C deficiency, continue  Xarelto. Hx hemorrhagic CVA, right sided weakness  GERD without  esophagitis Stable, continue Omeprazole.   Essential hypertension Blood pressure is controlled, continue Metoprolol.   Adult failure to thrive Goal of care is comfort measures, Hospice service.      Family/ staff Communication: plan of care reviewed with the patient, the patient's HPOA, and charge nurse.   Labs/tests ordered:  CBC, BMP one week  Time spend 35 minutes.

## 2019-06-24 NOTE — Discharge Summary (Signed)
Physician Discharge Summary  Erica [name] WUJ:[id number] DOB: 04-20-1927 DOA: 06/19/2019  PCP: Mast, Man X, NP  Admit date: 06/19/2019 Discharge date: 06/24/2019  Admitted From: [facility] Disposition: [facility] with hospice  Recommendations for Outpatient Follow-up:  1. Follow up with PCP in 1-2 weeks 2. Please obtain BMP/CBC in one week your next doctors visit.  3. Augmentin for 5 days   Discharge Condition: Stable CODE STATUS: DNR Diet recommendation: Comfort feeding  Brief/Interim Summary: 86 year old with history of vascular dementia, previous DVT on Xarelto, hemorrhagic CVA with residual right-sided weakness, HTN, seizure disorder, GERD sent from skilled nursing facility for evaluation of lethargy and vomiting.  Initially noted to be febrile tachycardic with elevated lactic acidosis.  Transaminitis but ultrasound was negative for acute cholecystitis.  Initial CMP showed acute cholecystitis with bibasilar pneumonia.  Started on Zosyn.  Blood cultures grew E. coli, HIDA scan was a poor study.  GI recommended conservative management.  Failed speech and swallow therapy-moderate aspiration risk, MBS.   Eventually family and patient decided to transition to comfort care and hospice team follow her at her facility.  Continue her routine medications if she tolerates it, MOST form filled out.  Original is at her facility. Stable for discharge   Assessment & Plan:   Principal Problem:   Severe sepsis with acute organ dysfunction due to Gram negative bacteria (HCC) Active Problems:   Essential hypertension   GERD without esophagitis   Vascular dementia with behavior disturbance (HCC)   Chronic anticoagulation   Goals of care, counseling/discussion   Seizure disorder (HCC)   Acute cholecystitis   Left ureteral stone   Lactic acidosis   Gram-negative sepsis with organ dysfunction (HCC)   Aspiration pneumonia of both lower lobes due to gastric secretions (HCC)    Elevated AST (SGOT)   Elevated ALT measurement   Elevated bilirubin  Sepsis, unknown exact etiology intra-abdominal versus aspiration pneumonia Urinary tract infection-VRE, Klebsiella E. coli bacteremia -CT showed acute cholecystitis but right upper quadrant ultrasound is not consistent with this. -HIDA scan-poor study with incomplete intake. -Seen by general surgery and GI.  Conservative management. -I believe VRE is a colonizer therefore we will hold off on treating that.  Continue IV Rocephin.  Transition to Augmentin for 5 days -Resume home Xarelto if she tolerates.  Hypokalemia -Improved  Aspiration pneumonia versus pneumonitis Pulmonary edema -As needed bronchodilators, supplemental oxygen as needed.  IV Rocephin will be transitioned to oral Augmentin for 5 more days -Speech and swallow evaluation-moderate aspiration risk.  Plans for MBS today.  GERD without esophagitis -PPI  Left-sided nonobstructive renal stone with urinary tract infection, growing Klebsiella -supportive care.  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  Will use Lovenox instead for now  Seizure disorder -On Keppra    Discharge Diagnoses:  Principal Problem:   Severe sepsis with acute organ dysfunction due to Gram negative bacteria ([place]) Active Problems:   Essential hypertension   GERD without esophagitis   Vascular dementia with behavior disturbance (HCC)   Chronic anticoagulation   Goals of care, counseling/discussion   Seizure disorder ([place])   Acute cholecystitis   Left ureteral stone   Lactic acidosis   Gram-negative sepsis with organ dysfunction (HCC)   Aspiration pneumonia of both lower lobes due to gastric secretions (HCC)   Elevated AST (SGOT)   Elevated ALT measurement  Elevated  bilirubin     Subjective: Doing okay no complaints.  Daughter at bedside  Discharge Exam: Vitals:   06/24/19 0602 06/24/19 0745  BP: (!) 146/70   Pulse: 80   Resp: 20   Temp: 98.6 F (37 C)   SpO2: 94% 92%   Vitals:   06/23/19 2023 06/23/19 2146 06/24/19 0602 06/24/19 0745  BP:  (!) 143/70 (!) 146/70   Pulse:  75 80   Resp:  15 20   Temp:   98.6 F (37 C)   TempSrc:   Oral   SpO2: 92%  94% 92%  Weight:      Height:        General: Patient sleeping on 2 L nasal cannula Cardiovascular: Some bibasilar rhonchi Respiratory: CTA bilaterally, no wheezing, no rhonchi Abdominal: Soft, NT, ND, bowel sounds + Extremities: no edema, no cyanosis  Discharge Instructions   Allergies as of 06/24/2019      Reactions   Hydrocodone Nausea And Vomiting   Morphine And Related Nausea And Vomiting   Actonel [risedronate Sodium] Other (See Comments)   unknown   Darvon [propoxyphene Hcl] Other (See Comments)   unknown   Oxycodone Hcl Other (See Comments)   unknown   Pneumovax [pneumococcal Polysaccharide Vaccine] Other (See Comments)   unknown      Medication List    TAKE these medications   acetaminophen 325 MG tablet Commonly known as: TYLENOL Take 650 mg by mouth every 4 (four) hours as needed for mild pain.   amoxicillin-clavulanate 875-125 MG tablet Commonly known as: Augmentin Take 1 tablet by mouth every 12 (twelve) hours for 5 days.   bisacodyl 10 MG suppository Commonly known as: DULCOLAX Place 10 mg rectally daily as needed for mild constipation or moderate constipation.   hydrocortisone 2.5 % lotion Apply 1 application topically 2 (two) times daily as needed (face).   levETIRAcetam 100 MG/ML solution Commonly known as: KEPPRA Take 250 mg by mouth 2 (two) times daily. 250 mg = 2.5 ML; oral Twice A Day  Morning: 7am-10am Evening: 7pm-10pm   loratadine 10 MG tablet Commonly known as: CLARITIN Take 10 mg by mouth daily. 7am-10am.   LORazepam 0.5 MG  tablet Commonly known as: ATIVAN Take 1 tablet (0.5 mg total) by mouth 2 (two) times daily as needed (agitation).   metoprolol tartrate 25 MG tablet Commonly known as: LOPRESSOR Take 12.5 mg by mouth 2 (two) times daily. Morning: 7am-10am Evening: 7pm-10pm.   mirtazapine 15 MG tablet Commonly known as: REMERON Take 7.5 mg by mouth at bedtime. 7pm-10pm.   nystatin powder Generic drug: nystatin Apply 1 application topically daily as needed (under left breast). 100,000unit Under left breast   nystatin cream Commonly known as: MYCOSTATIN Apply 1 application topically See admin instructions. Mix with triamcinolone cream and apply to reddened area on back and buttocks with each continent episode and prn, then cover with zinc oxide barrier cream.   omeprazole 10 MG capsule Commonly known as: PRILOSEC Take 10 mg by mouth daily. 7am-10am.   polyethylene glycol 17 g packet Commonly known as: MIRALAX / GLYCOLAX Take 17 g by mouth daily. 7am-10am.   risperiDONE 0.25 MG tablet Commonly known as: RISPERDAL Take 0.25 mg by mouth every evening. 7pm-10pm   Rivaroxaban 15 MG Tabs tablet Commonly known as: XARELTO Take 15 mg by mouth every evening. 4pm-6pm.   sertraline 100 MG tablet Commonly known as: ZOLOFT Take 100 mg by mouth at bedtime. 7pm-10pm. Give 100mg  with 25mg  to =125mg   sertraline 25 MG tablet Commonly known as: ZOLOFT Take 25 mg by mouth at bedtime. 7pm-10pm. Give 25mg  along with 100mg  to =125mg .   simethicone 80 MG chewable tablet Commonly known as: MYLICON Chew 80 mg by mouth 3 (three) times daily with meals.   triamcinolone cream 0.5 % Commonly known as: KENALOG Apply 1 application topically See admin instructions. TID and PRN. Mix with Nystatin cream and apply to reddened areas on back and buttocks with each incontinent episode and prn, then cover with zinc oxide barrier cream       Follow-up Information    Mast, Man X, NP. Schedule an appointment as soon as  possible for a visit in 1 week(s).   Specialty: Internal Medicine Contact information: [address] [address] [phone]              Allergies  Allergen Reactions  . Hydrocodone Nausea And Vomiting  . Morphine And Related Nausea And Vomiting  . Actonel [Risedronate Sodium] Other (See Comments)    unknown  . Darvon [Propoxyphene Hcl] Other (See Comments)    unknown  . Oxycodone Hcl Other (See Comments)    unknown  . Pneumovax [Pneumococcal Polysaccharide Vaccine] Other (See Comments)    unknown    You were cared for by a hospitalist during your hospital stay. If you have any questions about your discharge medications or the care you received while you were in the hospital after you are discharged, you can call the unit and asked to speak with the hospitalist on call if the hospitalist that took care of you is not available. Once you are discharged, your primary care physician will handle any further medical issues. Please note that no refills for any discharge medications will be authorized once you are discharged, as it is imperative that you return to your primary care physician (or establish a relationship with a primary care physician if you do not have one) for your aftercare needs so that they can reassess your need for medications and monitor your lab values.   Procedures/Studies: NM Hepatobiliary Liver Func  Result Date: 06/20/2019 CLINICAL DATA:  Concern for cholecystitis on CT exam. Negative ultrasound exam. No biliary duct dilatation on CT exam. EXAM: NUCLEAR MEDICINE HEPATOBILIARY IMAGING TECHNIQUE: Sequential images of the abdomen were obtained out to 60 minutes following intravenous administration of radiopharmaceutical. RADIOPHARMACEUTICALS:  5.4 mCi Tc-82m  Choletec IV COMPARISON:  None. FINDINGS: Mild delay in radiotracer clearance from the blood pool. There is uniform uptake within liver. The common bile duct is not identified over a 2 hour exam.  No activity in the bowel. The gallbladder subsequently does not fill. IMPRESSION: No excretion of radiotracer into the bowel. Bile duct obstruction is not favored as the biliary tree with normal on comparison ultrasound and CT. Therefore favor hepatic dysfunction such as hepatitis. Electronically Signed   By: Suzy Bouchard M.D.   On: 06/20/2019 13:00   CT ABDOMEN PELVIS W CONTRAST  Result Date: 06/20/2019 CLINICAL DATA:  Biliary colic. EXAM: CT ABDOMEN AND PELVIS WITH CONTRAST TECHNIQUE: Multidetector CT imaging of the abdomen and pelvis was performed using the standard protocol following bolus administration of intravenous contrast. CONTRAST:  51mL OMNIPAQUE IOHEXOL 300 MG/ML  SOLN COMPARISON:  July 13, 2016 FINDINGS: Lower chest: Significant bibasilar airspace disease is noted favored to represent a combination of both atelectasis and consolidation, especially at the right lung base.The heart is enlarged. Hepatobiliary: There is decreased hepatic attenuation suggestive of hepatic steatosis. There appears to  be some gallbladder wall thickening and adjacent inflammatory changes. There appears to be pericholecystic free fluid.There is no biliary ductal dilation. Pancreas: Normal contours without ductal dilatation. No peripancreatic fluid collection. Spleen: Unremarkable. Adrenals/Urinary Tract: --Adrenal glands: Unremarkable. --Right kidney/ureter: No hydronephrosis or radiopaque kidney stones. --Left kidney/ureter: There is an at least partially duplicated left-sided collecting system. There is a nonobstructing stone measuring approximately 6 mm in the distal left ureter, nearly at the left UVJ (axial series 2, image 73). --Urinary bladder: Unremarkable. Stomach/Bowel: --Stomach/Duodenum: No hiatal hernia or other gastric abnormality. Normal duodenal course and caliber. --Small bowel: Unremarkable. --Colon: There is a very large amount of stool the level the rectum. There is scattered colonic diverticula  without CT evidence for diverticulitis. --Appendix: Not visualized. No right lower quadrant inflammation or free fluid. Vascular/Lymphatic: Atherosclerotic calcification is present within the non-aneurysmal abdominal aorta, without hemodynamically significant stenosis. --No retroperitoneal lymphadenopathy. --No mesenteric lymphadenopathy. --No pelvic or inguinal lymphadenopathy. Reproductive: Status post hysterectomy. No adnexal mass. Other: No ascites or free air. The abdominal wall is normal. Musculoskeletal. Again noted is a compression fracture of the T9 vertebral body. There has been significant interval height loss since the patient's prior study dated 07/25/2018. There is no new acute appearing compression fracture. IMPRESSION: 1. Findings are concerning for acute cholecystitis. As this is discordant with the patient's recent ultrasound, correlation with laboratory studies and HIDA scan is recommended. 2. There is a large amount of stool the level of the rectum. 3. There is a nonobstructing stone measuring approximately 6 mm in the distal left ureter, nearly at the left UVJ. 4. Significant bibasilar airspace disease favored to represent a combination of both atelectasis and consolidation, especially at the right lung base. 5. Hepatic steatosis. 6. Cardiomegaly. 7. Again noted is a T9 compression fracture with significant interval height loss since 2020. Aortic Atherosclerosis (ICD10-I70.0). Electronically Signed   By: Constance Holster M.D.   On: 06/20/2019 00:26   DG Chest Port 1 View  Result Date: 06/22/2019 CLINICAL DATA:  Dyspnea EXAM: PORTABLE CHEST 1 VIEW COMPARISON:  06/19/2019 FINDINGS: Cardiac shadow is stable. Mild vascular congestion is again seen and slightly increased. Mild interstitial edema is now noted. Patchy increased density in the left base is noted consistent with mild atelectatic change. No bony abnormality is noted. IMPRESSION: Persistent and slightly increased vascular congestion  with mild edema. Mild left basilar atelectasis. Electronically Signed   By: Inez Catalina M.D.   On: 06/22/2019 12:43   DG Chest Port 1 View  Result Date: 06/19/2019 CLINICAL DATA:  Shortness of breath and unresponsiveness EXAM: PORTABLE CHEST 1 VIEW COMPARISON:  07/25/2018 FINDINGS: Cardiac shadow is enlarged but stable. Aortic calcifications are again seen. Overall inspiratory effort is poor however no focal infiltrate is seen. Mild central vascular prominence is noted. No bony abnormality is noted. IMPRESSION: Mild central vascular congestion.  No focal infiltrate is noted. Electronically Signed   By: Inez Catalina M.D.   On: 06/19/2019 22:22   ECHOCARDIOGRAM COMPLETE  Result Date: 06/20/2019    ECHOCARDIOGRAM REPORT   Patient Name:   MARLENA BARBATO Date of Exam: 06/20/2019 Medical Rec #:  [id number]     Height:       63.0 in Accession #:    [id number]    Weight:       154.3 lb Date of Birth:  September 09, 1927     BSA:          1.732 m Patient Age:    77 years  BP:           149/60 mmHg Patient Gender: F             HR:           93 bpm. Exam Location:  Inpatient Procedure: 2D Echo, Cardiac Doppler and Color Doppler Indications:    CHF-Acute Diastolic 456.25 / W38.93  History:        Patient has no prior history of Echocardiogram examinations.                 Stroke; Risk Factors:Hypertension and Non-Smoker. Sepsis. GERD.  Sonographer:    Vickie Epley RDCS Referring Phys: [id number] [place]  Sonographer Comments: Image acquisition challenging due to respiratory motion. IMPRESSIONS  1. Left ventricular ejection fraction, by estimation, is 60 to 65%. The left ventricle has normal function. The left ventricle has no regional wall motion abnormalities. There is mild left ventricular hypertrophy. Left ventricular diastolic parameters are indeterminate.  2. Right ventricular systolic function is normal. The right ventricular size is normal. There is normal pulmonary artery systolic pressure. The estimated right  ventricular systolic pressure is 81.1 mmHg.  3. The mitral valve is normal in structure. No evidence of mitral valve regurgitation.  4. The aortic valve was not well visualized. Aortic valve regurgitation is not visualized. No aortic stenosis is present.  5. The inferior vena cava is normal in size with greater than 50% respiratory variability, suggesting right atrial pressure of 3 mmHg. FINDINGS  Left Ventricle: Left ventricular ejection fraction, by estimation, is 60 to 65%. The left ventricle has normal function. The left ventricle has no regional wall motion abnormalities. The left ventricular internal cavity size was small. There is mild left ventricular hypertrophy. Left ventricular diastolic parameters are indeterminate. Right Ventricle: The right ventricular size is normal. Right vetricular wall thickness was not assessed. Right ventricular systolic function is normal. There is normal pulmonary artery systolic pressure. The tricuspid regurgitant velocity is 2.37 m/s, and with an assumed right atrial pressure of 3 mmHg, the estimated right ventricular systolic pressure is 57.2 mmHg. Left Atrium: Left atrial size was normal in size. Right Atrium: Right atrial size was normal in size. Pericardium: Trivial pericardial effusion is present. Mitral Valve: The mitral valve is normal in structure. No evidence of mitral valve regurgitation. Tricuspid Valve: The tricuspid valve is normal in structure. Tricuspid valve regurgitation is trivial. Aortic Valve: The aortic valve was not well visualized. Aortic valve regurgitation is not visualized. No aortic stenosis is present. Pulmonic Valve: The pulmonic valve was not well visualized. Pulmonic valve regurgitation is trivial. Aorta: The aortic root and ascending aorta are structurally normal, with no evidence of dilitation. Venous: The inferior vena cava is normal in size with greater than 50% respiratory variability, suggesting right atrial pressure of 3 mmHg. IAS/Shunts:  The interatrial septum was not well visualized.  LEFT VENTRICLE PLAX 2D LVIDd:         3.10 cm LVIDs:         2.00 cm LV PW:         0.90 cm LV IVS:        0.90 cm LVOT diam:     1.70 cm LV SV:         48 LV SV Index:   28 LVOT Area:     2.27 cm  RIGHT VENTRICLE TAPSE (M-mode): 2.1 cm LEFT ATRIUM             Index  RIGHT ATRIUM           Index LA diam:        3.40 cm 1.96 cm/m  RA Area:     11.60 cm LA Vol (A2C):   26.1 ml 15.07 ml/m RA Volume:   18.80 ml  10.86 ml/m LA Vol (A4C):   23.0 ml 13.28 ml/m LA Biplane Vol: 26.3 ml 15.19 ml/m  AORTIC VALVE LVOT Vmax:   109.00 cm/s LVOT Vmean:  77.800 cm/s LVOT VTI:    0.213 m  AORTA Ao Root diam: 2.90 cm TRICUSPID VALVE TR Peak grad:   22.5 mmHg TR Vmax:        237.00 cm/s  SHUNTS Systemic VTI:  0.21 m Systemic Diam: 1.70 cm Oswaldo Milian MD Electronically signed by Oswaldo Milian MD Signature Date/Time: 06/20/2019/7:41:35 PM    Final    US Abdomen Limited RUQ  Result Date: 06/19/2019 CLINICAL DATA:  Transaminitis EXAM: ULTRASOUND ABDOMEN LIMITED RIGHT UPPER QUADRANT COMPARISON:  None. FINDINGS: Gallbladder: No gallstones or wall thickening visualized. No sonographic Murphy sign noted by sonographer. Common bile duct: Diameter: Normal caliber, 6 mm. Liver: Increased echotexture compatible with fatty infiltration. No focal abnormality or biliary ductal dilatation. Portal vein is patent on color Doppler imaging with normal direction of blood flow towards the liver. Other: None. IMPRESSION: Fatty infiltration of the liver. No acute findings. Electronically Signed   By: Rolm Baptise M.D.   On: 06/19/2019 23:29     The results of significant diagnostics from this hospitalization (including imaging, microbiology, ancillary and laboratory) are listed below for reference.     Microbiology: Recent Results (from the past 240 hour(s))  Blood Culture (routine x 2)     Status: Abnormal   Collection Time: 06/19/19  9:51 PM   Specimen: BLOOD RIGHT  HAND  Result Value Ref Range Status   Specimen Description   Final    BLOOD RIGHT HAND Performed at [facility], [place] [address] [place], [place] [address]    Special Requests   Final    BOTTLES DRAWN AEROBIC AND ANAEROBIC Blood Culture adequate volume Performed at [facility] [address] [place], [place] [address]    Culture  Setup Time   Final    AEROBIC BOTTLE ONLY GRAM NEGATIVE RODS CRITICAL RESULT CALLED TO, READ BACK BY AND VERIFIED WITH: PHARMD M HICKS [id number] AT 2 AM BY CM Performed at [facility], [place] [address], [place], [place] [address]    Culture ESCHERICHIA COLI (A)  Final   Report Status 06/23/2019 FINAL  Final   Organism ID, Bacteria ESCHERICHIA COLI  Final      Susceptibility   Escherichia coli - MIC*    AMPICILLIN 8 SENSITIVE Sensitive     CEFAZOLIN <=4 SENSITIVE Sensitive     CEFEPIME <=0.12 SENSITIVE Sensitive     CEFTAZIDIME <=1 SENSITIVE Sensitive     CEFTRIAXONE <=0.25 SENSITIVE Sensitive     CIPROFLOXACIN <=0.25 SENSITIVE Sensitive     GENTAMICIN <=1 SENSITIVE Sensitive     IMIPENEM <=0.25 SENSITIVE Sensitive     TRIMETH/SULFA <=20 SENSITIVE Sensitive     AMPICILLIN/SULBACTAM 4 SENSITIVE Sensitive     PIP/TAZO <=4 SENSITIVE Sensitive     * ESCHERICHIA COLI  Blood Culture ID Panel (Reflexed)     Status: Abnormal   Collection Time: 06/19/19  9:51 PM  Result Value Ref Range Status   Enterococcus species NOT DETECTED NOT DETECTED Final   Listeria monocytogenes NOT DETECTED  NOT DETECTED Final   Staphylococcus species NOT DETECTED NOT DETECTED Final   Staphylococcus aureus (BCID) NOT DETECTED NOT DETECTED Final   Streptococcus species NOT DETECTED NOT DETECTED Final   Streptococcus agalactiae NOT DETECTED NOT DETECTED Final   Streptococcus pneumoniae NOT DETECTED NOT DETECTED Final   Streptococcus pyogenes NOT DETECTED NOT DETECTED Final   Acinetobacter baumannii NOT DETECTED NOT DETECTED Final    Enterobacteriaceae species DETECTED (A) NOT DETECTED Final    Comment: Enterobacteriaceae represent a large family of gram-negative bacteria, not a single organism. CRITICAL RESULT CALLED TO, READ BACK BY AND VERIFIED WITH: PHARMD M HICKS [id number] AT 79 AM BY CM    Enterobacter cloacae complex NOT DETECTED NOT DETECTED Final   Escherichia coli DETECTED (A) NOT DETECTED Final    Comment: CRITICAL RESULT CALLED TO, READ BACK BY AND VERIFIED WITH: PHARMD M HICKS [id number] AR 77 AM BY CM    Klebsiella oxytoca NOT DETECTED NOT DETECTED Final   Klebsiella pneumoniae NOT DETECTED NOT DETECTED Final   Proteus species NOT DETECTED NOT DETECTED Final   Serratia marcescens NOT DETECTED NOT DETECTED Final   Carbapenem resistance NOT DETECTED NOT DETECTED Final   Haemophilus influenzae NOT DETECTED NOT DETECTED Final   Neisseria meningitidis NOT DETECTED NOT DETECTED Final   Pseudomonas aeruginosa NOT DETECTED NOT DETECTED Final   Candida albicans NOT DETECTED NOT DETECTED Final   Candida glabrata NOT DETECTED NOT DETECTED Final   Candida krusei NOT DETECTED NOT DETECTED Final   Candida parapsilosis NOT DETECTED NOT DETECTED Final   Candida tropicalis NOT DETECTED NOT DETECTED Final    Comment: Performed at [facility], [place] [address], [place], [place] [address]  Blood Culture (routine x 2)     Status: Abnormal   Collection Time: 06/19/19  9:55 PM   Specimen: BLOOD LEFT HAND  Result Value Ref Range Status   Specimen Description   Final    BLOOD LEFT HAND Performed at [facility] [address], [place], [place] [address]    Special Requests   Final    BOTTLES DRAWN AEROBIC AND ANAEROBIC Blood Culture adequate volume Performed at [facility] [address], [place], [place] [address]    Culture  Setup Time   Final    AEROBIC BOTTLE ONLY GRAM NEGATIVE RODS CRITICAL VALUE NOTED.  VALUE IS CONSISTENT WITH PREVIOUSLY REPORTED AND CALLED VALUE.     Culture (A)  Final    ESCHERICHIA COLI SUSCEPTIBILITIES PERFORMED ON PREVIOUS CULTURE WITHIN THE LAST 5 DAYS. Performed at [facility], [place] [address], [place], [place] [address]    Report Status 06/23/2019 FINAL  Final  Urine culture     Status: Abnormal   Collection Time: 06/19/19 10:00 PM   Specimen: In/Out Cath Urine  Result Value Ref Range Status   Specimen Description   Final    IN/OUT CATH URINE Performed at [facility] [address] [place], [place] [address]    Special Requests   Final    NONE Performed at [facility], [place] [address], [place], [place] [address]    Culture (A)  Final    60,000 COLONIES/mL KLEBSIELLA PNEUMONIAE 40,000 COLONIES/mL VANCOMYCIN RESISTANT ENTEROCOCCUS ISOLATED    Report Status 06/22/2019 FINAL  Final   Organism ID, Bacteria KLEBSIELLA PNEUMONIAE (A)  Final   Organism ID, Bacteria VANCOMYCIN RESISTANT ENTEROCOCCUS ISOLATED (A)  Final      Susceptibility   Klebsiella pneumoniae - MIC*  AMPICILLIN RESISTANT Resistant     CEFAZOLIN <=4 SENSITIVE Sensitive     CEFTRIAXONE <=1 SENSITIVE Sensitive     CIPROFLOXACIN <=0.25 SENSITIVE Sensitive     GENTAMICIN <=1 SENSITIVE Sensitive     IMIPENEM <=0.25 SENSITIVE Sensitive     NITROFURANTOIN <=16 SENSITIVE Sensitive     TRIMETH/SULFA <=20 SENSITIVE Sensitive     AMPICILLIN/SULBACTAM 4 SENSITIVE Sensitive     PIP/TAZO <=4 SENSITIVE Sensitive     * 60,000 COLONIES/mL KLEBSIELLA PNEUMONIAE   Vancomycin resistant enterococcus isolated - MIC*    AMPICILLIN 16 RESISTANT Resistant     NITROFURANTOIN 64 INTERMEDIATE Intermediate     VANCOMYCIN >=32 RESISTANT Resistant     * 40,000 COLONIES/mL VANCOMYCIN RESISTANT ENTEROCOCCUS ISOLATED  SARS Coronavirus 2 by RT PCR (hospital order, performed in [place] hospital lab) Nasopharyngeal Nasopharyngeal Swab     Status: None   Collection Time: 06/19/19 10:30 PM   Specimen: Nasopharyngeal Swab  Result Value  Ref Range Status   SARS Coronavirus 2 NEGATIVE NEGATIVE Final    Comment: (NOTE) SARS-CoV-2 target nucleic acids are NOT DETECTED.  The SARS-CoV-2 RNA is generally detectable in upper and lower respiratory specimens during the acute phase of infection. The lowest concentration of SARS-CoV-2 viral copies this assay can detect is 250 copies / mL. A negative result does not preclude SARS-CoV-2 infection and should not be used as the sole basis for treatment or other patient management decisions.  A negative result may occur with improper specimen collection / handling, submission of specimen other than nasopharyngeal swab, presence of viral mutation(s) within the areas targeted by this assay, and inadequate number of viral copies (<250 copies / mL). A negative result must be combined with clinical observations, patient history, and epidemiological information.  Fact Sheet for Patients:   StrictlyIdeas.no  Fact Sheet for Healthcare Providers: BankingDealers.co.za  This test is not yet approved or  cleared by the [place] FDA and has been authorized for detection and/or diagnosis of SARS-CoV-2 by FDA under an Emergency Use Authorization (EUA).  This EUA will remain in effect (meaning this test can be used) for the duration of the COVID-19 declaration under Section 564(b)(1) of the Act, 21 U.S.C. section 360bbb-3(b)(1), unless the authorization is terminated or revoked sooner.  Performed at [facility], [place] [address] [place], [place] [address]   MRSA PCR Screening     Status: None   Collection Time: 06/20/19  6:38 PM   Specimen: Nasal Mucosa; Nasopharyngeal  Result Value Ref Range Status   MRSA by PCR NEGATIVE NEGATIVE Final    Comment:        The GeneXpert MRSA Assay (FDA approved for NASAL specimens only), is one component of a comprehensive MRSA colonization surveillance program. It is not intended to  diagnose MRSA infection nor to guide or monitor treatment for MRSA infections. Performed at [facility], [place] [address], [place], [place] [address]      Labs: BNP (last 3 results) Recent Labs    06/22/19 0505 06/23/19 0435  BNP 338.4* 785.8*   Basic Metabolic Panel: Recent Labs  Lab 06/19/19 2200 06/20/19 0555 06/21/19 0441 06/22/19 0505 06/23/19 0435  NA 147* 143 144 139 135  K 3.1* 4.0 3.6 2.8* 2.8*  CL 111 114* 114* 112* 103  CO2 20* 18* 20* 19* 23  GLUCOSE 159* 158* 93 104* 104*  BUN 14 10 11  7* 6*  CREATININE 1.28* 1.06* 1.01* 0.82 0.77  CALCIUM 8.4* 7.0* 7.2* 7.4*  7.5*  MG  --   --  1.8 1.8 2.0   Liver Function Tests: Recent Labs  Lab 06/19/19 2208 06/20/19 0555 06/21/19 0441 06/22/19 0505 06/23/19 0435  AST 234* 234* 123* 62* 38  ALT 152* 175* 132* 98* 74*  ALKPHOS 144* 110 102 116 144*  BILITOT 2.1* 2.3* 2.9* 1.8* 1.5*  PROT 6.3* 5.2* 5.5* 5.5* 6.1*  ALBUMIN 3.4* 2.8* 2.9* 2.8* 3.0*   No results for input(s): LIPASE, AMYLASE in the last 168 hours. Recent Labs  Lab 06/20/19 1423  AMMONIA 32   CBC: Recent Labs  Lab 06/19/19 2200 06/19/19 2200 06/20/19 0555 06/20/19 0658 06/21/19 0441 06/22/19 0505 06/23/19 0435  WBC 7.7   < > SPECIMEN CLOTTED 14.4* 9.3 8.4 7.0  NEUTROABS 7.0  --  SPECIMEN CLOTTED 12.9*  --   --   --   HGB 15.2*   < > SPECIMEN CLOTTED 12.9 11.6* 11.4* 12.3  HCT 48.4*   < > SPECIMEN CLOTTED 42.3 37.0 35.9* 38.6  MCV 96.2   < > SPECIMEN CLOTTED 100.7* 95.4 93.7 93.2  PLT 222   < > SPECIMEN CLOTTED 210 167 163 158   < > = values in this interval not displayed.   Cardiac Enzymes: No results for input(s): CKTOTAL, CKMB, CKMBINDEX, TROPONINI in the last 168 hours. BNP: Invalid input(s): POCBNP CBG: Recent Labs  Lab 06/23/19 0610 06/23/19 1321 06/23/19 1853 06/23/19 2356 06/24/19 0558  GLUCAP 106* 106* 104* 120* 113*   D-Dimer No results for input(s): DDIMER in the last 72 hours. Hgb A1c No  results for input(s): HGBA1C in the last 72 hours. Lipid Profile No results for input(s): CHOL, HDL, LDLCALC, TRIG, CHOLHDL, LDLDIRECT in the last 72 hours. Thyroid function studies No results for input(s): TSH, T4TOTAL, T3FREE, THYROIDAB in the last 72 hours.  Invalid input(s): FREET3 Anemia work up No results for input(s): VITAMINB12, FOLATE, FERRITIN, TIBC, IRON, RETICCTPCT in the last 72 hours. Urinalysis    Component Value Date/Time   COLORURINE YELLOW 06/19/2019 2200   APPEARANCEUR CLEAR 06/19/2019 2200   LABSPEC 1.012 06/19/2019 2200   PHURINE 5.0 06/19/2019 2200   GLUCOSEU NEGATIVE 06/19/2019 2200   HGBUR MODERATE (A) 06/19/2019 2200   BILIRUBINUR NEGATIVE 06/19/2019 2200   KETONESUR NEGATIVE 06/19/2019 2200   PROTEINUR NEGATIVE 06/19/2019 2200   NITRITE POSITIVE (A) 06/19/2019 2200   LEUKOCYTESUR TRACE (A) 06/19/2019 2200   Sepsis Labs Invalid input(s): PROCALCITONIN,  WBC,  LACTICIDVEN Microbiology Recent Results (from the past 240 hour(s))  Blood Culture (routine x 2)     Status: Abnormal   Collection Time: 06/19/19  9:51 PM   Specimen: BLOOD RIGHT HAND  Result Value Ref Range Status   Specimen Description   Final    BLOOD RIGHT HAND Performed at [facility], [place] [address], [place], [place] [address]    Special Requests   Final    BOTTLES DRAWN AEROBIC AND ANAEROBIC Blood Culture adequate volume Performed at [place] [address], [place], [place] [address]    Culture  Setup Time   Final    AEROBIC BOTTLE ONLY GRAM NEGATIVE RODS CRITICAL RESULT CALLED TO, READ BACK BY AND VERIFIED WITH: PHARMD M HICKS [id number] AT 78 AM BY CM Performed at [facility], [place] [address] [place], [place] [address]    Culture ESCHERICHIA COLI (A)  Final   Report Status 06/23/2019 FINAL  Final   Organism ID, Bacteria ESCHERICHIA COLI  Final  Susceptibility   Escherichia coli - MIC*    AMPICILLIN 8 SENSITIVE Sensitive      CEFAZOLIN <=4 SENSITIVE Sensitive     CEFEPIME <=0.12 SENSITIVE Sensitive     CEFTAZIDIME <=1 SENSITIVE Sensitive     CEFTRIAXONE <=0.25 SENSITIVE Sensitive     CIPROFLOXACIN <=0.25 SENSITIVE Sensitive     GENTAMICIN <=1 SENSITIVE Sensitive     IMIPENEM <=0.25 SENSITIVE Sensitive     TRIMETH/SULFA <=20 SENSITIVE Sensitive     AMPICILLIN/SULBACTAM 4 SENSITIVE Sensitive     PIP/TAZO <=4 SENSITIVE Sensitive     * ESCHERICHIA COLI  Blood Culture ID Panel (Reflexed)     Status: Abnormal   Collection Time: 06/19/19  9:51 PM  Result Value Ref Range Status   Enterococcus species NOT DETECTED NOT DETECTED Final   Listeria monocytogenes NOT DETECTED NOT DETECTED Final   Staphylococcus species NOT DETECTED NOT DETECTED Final   Staphylococcus aureus (BCID) NOT DETECTED NOT DETECTED Final   Streptococcus species NOT DETECTED NOT DETECTED Final   Streptococcus agalactiae NOT DETECTED NOT DETECTED Final   Streptococcus pneumoniae NOT DETECTED NOT DETECTED Final   Streptococcus pyogenes NOT DETECTED NOT DETECTED Final   Acinetobacter baumannii NOT DETECTED NOT DETECTED Final   Enterobacteriaceae species DETECTED (A) NOT DETECTED Final    Comment: Enterobacteriaceae represent a large family of gram-negative bacteria, not a single organism. CRITICAL RESULT CALLED TO, READ BACK BY AND VERIFIED WITH: PHARMD M HICKS [id number] AT 58 AM BY CM    Enterobacter cloacae complex NOT DETECTED NOT DETECTED Final   Escherichia coli DETECTED (A) NOT DETECTED Final    Comment: CRITICAL RESULT CALLED TO, READ BACK BY AND VERIFIED WITH: PHARMD M HICKS [id number] AR 34 AM BY CM    Klebsiella oxytoca NOT DETECTED NOT DETECTED Final   Klebsiella pneumoniae NOT DETECTED NOT DETECTED Final   Proteus species NOT DETECTED NOT DETECTED Final   Serratia marcescens NOT DETECTED NOT DETECTED Final   Carbapenem resistance NOT DETECTED NOT DETECTED Final   Haemophilus influenzae NOT DETECTED NOT DETECTED Final   Neisseria  meningitidis NOT DETECTED NOT DETECTED Final   Pseudomonas aeruginosa NOT DETECTED NOT DETECTED Final   Candida albicans NOT DETECTED NOT DETECTED Final   Candida glabrata NOT DETECTED NOT DETECTED Final   Candida krusei NOT DETECTED NOT DETECTED Final   Candida parapsilosis NOT DETECTED NOT DETECTED Final   Candida tropicalis NOT DETECTED NOT DETECTED Final    Comment: Performed at [facility], [place]. [address] [place], [place] [address]  Blood Culture (routine x 2)     Status: Abnormal   Collection Time: 06/19/19  9:55 PM   Specimen: BLOOD LEFT HAND  Result Value Ref Range Status   Specimen Description   Final    BLOOD LEFT HAND Performed at [facility] [address], [place], [place] [address]    Special Requests   Final    BOTTLES DRAWN AEROBIC AND ANAEROBIC Blood Culture adequate volume Performed at [place] [facility] [place] [address], [place], [place] [address]    Culture  Setup Time   Final    AEROBIC BOTTLE ONLY GRAM NEGATIVE RODS CRITICAL VALUE NOTED.  VALUE IS CONSISTENT WITH PREVIOUSLY REPORTED AND CALLED VALUE.    Culture (A)  Final    ESCHERICHIA COLI SUSCEPTIBILITIES PERFORMED ON PREVIOUS CULTURE WITHIN THE LAST 5 DAYS. Performed at [facility], [place] [address] [place], [place] [address]    Report Status 06/23/2019 FINAL  Final  Urine culture     Status: Abnormal   Collection Time: 06/19/19 10:00 PM   Specimen: In/Out Cath Urine  Result Value Ref Range Status   Specimen Description   Final    IN/OUT CATH URINE Performed at [facility] [address], [place], [place] [address]    Special Requests   Final    NONE Performed at [facility], [place] [address], [place], [place] [address]    Culture (A)  Final    60,000 COLONIES/mL KLEBSIELLA PNEUMONIAE 40,000 COLONIES/mL VANCOMYCIN RESISTANT ENTEROCOCCUS ISOLATED    Report Status 06/22/2019 FINAL  Final   Organism ID, Bacteria  KLEBSIELLA PNEUMONIAE (A)  Final   Organism ID, Bacteria VANCOMYCIN RESISTANT ENTEROCOCCUS ISOLATED (A)  Final      Susceptibility   Klebsiella pneumoniae - MIC*    AMPICILLIN RESISTANT Resistant     CEFAZOLIN <=4 SENSITIVE Sensitive     CEFTRIAXONE <=1 SENSITIVE Sensitive     CIPROFLOXACIN <=0.25 SENSITIVE Sensitive     GENTAMICIN <=1 SENSITIVE Sensitive     IMIPENEM <=0.25 SENSITIVE Sensitive     NITROFURANTOIN <=16 SENSITIVE Sensitive     TRIMETH/SULFA <=20 SENSITIVE Sensitive     AMPICILLIN/SULBACTAM 4 SENSITIVE Sensitive     PIP/TAZO <=4 SENSITIVE Sensitive     * 60,000 COLONIES/mL KLEBSIELLA PNEUMONIAE   Vancomycin resistant enterococcus isolated - MIC*    AMPICILLIN 16 RESISTANT Resistant     NITROFURANTOIN 64 INTERMEDIATE Intermediate     VANCOMYCIN >=32 RESISTANT Resistant     * 40,000 COLONIES/mL VANCOMYCIN RESISTANT ENTEROCOCCUS ISOLATED  SARS Coronavirus 2 by RT PCR (hospital order, performed in [place] hospital lab) Nasopharyngeal Nasopharyngeal Swab     Status: None   Collection Time: 06/19/19 10:30 PM   Specimen: Nasopharyngeal Swab  Result Value Ref Range Status   SARS Coronavirus 2 NEGATIVE NEGATIVE Final    Comment: (NOTE) SARS-CoV-2 target nucleic acids are NOT DETECTED.  The SARS-CoV-2 RNA is generally detectable in upper and lower respiratory specimens during the acute phase of infection. The lowest concentration of SARS-CoV-2 viral copies this assay can detect is 250 copies / mL. A negative result does not preclude SARS-CoV-2 infection and should not be used as the sole basis for treatment or other patient management decisions.  A negative result may occur with improper specimen collection / handling, submission of specimen other than nasopharyngeal swab, presence of viral mutation(s) within the areas targeted by this assay, and inadequate number of viral copies (<250 copies / mL). A negative result must be combined with clinical observations, patient  history, and epidemiological information.  Fact Sheet for Patients:   StrictlyIdeas.no  Fact Sheet for Healthcare Providers: BankingDealers.co.za  This test is not yet approved or  cleared by the [place] FDA and has been authorized for detection and/or diagnosis of SARS-CoV-2 by FDA under an Emergency Use Authorization (EUA).  This EUA will remain in effect (meaning this test can be used) for the duration of the COVID-19 declaration under Section 564(b)(1) of the Act, 21 U.S.C. section 360bbb-3(b)(1), unless the authorization is terminated or revoked sooner.  Performed at [facility], [place] [address] [place], [place] [address]   MRSA PCR Screening     Status: None   Collection Time: 06/20/19  6:38 PM   Specimen: Nasal Mucosa; Nasopharyngeal  Result Value Ref Range Status   MRSA by PCR NEGATIVE NEGATIVE Final    Comment:        The GeneXpert MRSA  Assay (FDA approved for NASAL specimens only), is one component of a comprehensive MRSA colonization surveillance program. It is not intended to diagnose MRSA infection nor to guide or monitor treatment for MRSA infections. Performed at [facility], [place] [address] [place], [place] [address]      Time coordinating discharge:  I have spent 35 minutes face to face with the patient and on the ward discussing the patients care, assessment, plan and disposition with other care givers. >50% of the time was devoted counseling the patient about the risks and benefits of treatment/Discharge disposition and coordinating care.   SIGNED:   Damita Lack, MD  [facility] 06/24/2019, 10:42 AM   If 7PM-7AM, please contact night-coverage

## 2019-06-27 ENCOUNTER — Encounter: Payer: Self-pay | Admitting: Nurse Practitioner

## 2019-06-28 ENCOUNTER — Non-Acute Institutional Stay (SKILLED_NURSING_FACILITY): Payer: Medicare Other | Admitting: Internal Medicine

## 2019-06-28 ENCOUNTER — Encounter: Payer: Self-pay | Admitting: Internal Medicine

## 2019-06-28 DIAGNOSIS — D6859 Other primary thrombophilia: Secondary | ICD-10-CM | POA: Diagnosis not present

## 2019-06-28 DIAGNOSIS — Z22338 Carrier of other streptococcus: Secondary | ICD-10-CM | POA: Diagnosis not present

## 2019-06-28 DIAGNOSIS — R1310 Dysphagia, unspecified: Secondary | ICD-10-CM | POA: Diagnosis not present

## 2019-06-28 DIAGNOSIS — I10 Essential (primary) hypertension: Secondary | ICD-10-CM | POA: Diagnosis not present

## 2019-06-28 DIAGNOSIS — A4150 Gram-negative sepsis, unspecified: Secondary | ICD-10-CM

## 2019-06-28 DIAGNOSIS — G40909 Epilepsy, unspecified, not intractable, without status epilepticus: Secondary | ICD-10-CM | POA: Diagnosis not present

## 2019-06-28 DIAGNOSIS — R6520 Severe sepsis without septic shock: Secondary | ICD-10-CM

## 2019-06-28 DIAGNOSIS — E876 Hypokalemia: Secondary | ICD-10-CM | POA: Diagnosis not present

## 2019-06-28 LAB — BASIC METABOLIC PANEL
BUN: 11 (ref 4–21)
CO2: 26 — AB (ref 13–22)
Creatinine: 0.8 (ref 0.5–1.1)
Glucose: 90
Potassium: 3.6 (ref 3.4–5.3)
Sodium: 146 (ref 137–147)

## 2019-06-28 LAB — COMPREHENSIVE METABOLIC PANEL
Calcium: 8.2 — AB (ref 8.7–10.7)
GFR calc Af Amer: 82
GFR calc non Af Amer: 71

## 2019-06-28 NOTE — Progress Notes (Signed)
Provider:  [facility] Location:  [place] Room Number: 109 Place of Service:  SNF (31)  PCP: Mast, Man X, NP Patient Care Team: Mast, Man X, NP as PCP - General (Internal Medicine) Mast, Man X, NP as Nurse Practitioner (Internal Medicine) Juluis Rainier as Consulting Physician (Optometry) Monna Fam, MD as Consulting Physician (Ophthalmology) Lucky Cowboy, Gordy Clement, NP as Nurse Practitioner (Hospice and Palliative Medicine) Virgie Dad, MD as Consulting Physician (Internal Medicine)  Extended Emergency Contact Information Primary Emergency Contact: [facility] Address: [address], [place] [address] [name] of [place] Phone: [phone] Work Phone: [phone] Mobile Phone: [phone] Relation: Daughter Secondary Emergency Contact: Jenne Campus Home Phone: [phone] Relation: Daughter  Code Status: DNR Goals of Care: Advanced Directive information Advanced Directives 06/19/2019  Does Patient Have a Medical Advance Directive? Yes  Type of Paramedic of [place];Out of facility DNR (pink MOST or yellow form)  Does patient want to make changes to medical advance directive? No - Patient declined  Copy of [place] in Chart? Yes - validated most recent copy scanned in chart (See row information)  Pre-existing out of facility DNR order (yellow form or pink MOST form) Yellow form placed in chart (order not valid for inpatient use)      Chief Complaint  Patient presents with  . Readmit To SNF    Readmission    HPI: Patient is a 86 y.o. female seen today for admission to Readmission to SNF  Patient has a history of CVA,vascular dementia with behavior issues, hypertension history of DVT on chronic Xarelto.H/o Seizures  Was admitted in the hospital from 6/13-6/18 with E. coli sepsis, acute cholecystitis and UTI.  And pneumonia  She was  sent to the hospital for evaluation of lethargy , vomiting and fever.  Her blood cultures grew E. Coli.  CT showed acute cholecystitis.  Her liver functions were elevated.  HIDA scan was more Compatible with Hepatitis.   Was seen both by GI and general surgery and they all recommended conservative management. She was also diagnosed with aspiration pneumonia and is now discharged on oxygen and oral Augmentin. Also had Klebsiella and VRE in her Urine. Also found to have an incidental finding of left nonobstructive renal stone. Patient was eventually transition to hospice care as per family wishes. She is now enrolled in Hospice. Unable to give any history. Does respond to her name. Does not follows any commands Eating 25 % of her meals   Past Medical History:  Diagnosis Date  . Acute encephalopathy   . Aphasia following unspecified cerebrovascular disease   . 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 Depression   . Dysphagia following cerebrovascular disease   . Hepatic steatosis   . Hydroureteronephrosis   . Hypertension   . Insomnia   . Macular degeneration   . Major depressive disorder, recurrent ([place])   . Neuralgia and neuritis, unspecified ([place])   . Nontraumatic intracranial hemorrhage ([place])   . Stroke ([place])   . 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   . 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 ABDOMINAL HYSTERECTOMY    . APPENDECTOMY    . BREAST SURGERY    . MASTECTOMY Right     reports that she has never  smoked. She has never used smokeless tobacco. She reports that she does not drink alcohol and does not use drugs. Social History   Socioeconomic History  . Marital status: Widowed    Spouse name: Not on file  . Number of children: Not on file  . Years of education: Not on file  . Highest education level: Not on file  Occupational History  . Not on  file  Tobacco Use  . Smoking status: Never Smoker  . Smokeless tobacco: Never Used  Substance and Sexual Activity  . Alcohol use: No  . Drug use: No  . Sexual activity: Never  Other Topics Concern  . Not on file  Social History Narrative   Lives at [facility], [place] since 04/25/2013   Widowed   Never smoked   Alcohol none   DNR, POA, Living Will   Social Determinants of Health   Financial Resource Strain:   . Difficulty of Paying Living Expenses:   Food Insecurity:   . Worried About Charity fundraiser in the Last Year:   . Arboriculturist in the Last Year:   Transportation Needs:   . Film/video editor (Medical):   Marland Kitchen Lack of Transportation (Non-Medical):   Physical Activity:   . Days of Exercise per Week:   . Minutes of Exercise per Session:   Stress:   . Feeling of Stress :   Social Connections:   . Frequency of Communication with Friends and Family:   . Frequency of Social Gatherings with Friends and Family:   . Attends Religious Services:   . Active Member of Clubs or Organizations:   . Attends Archivist Meetings:   Marland Kitchen Marital Status:   Intimate Partner Violence:   . Fear of Current or Ex-Partner:   . Emotionally Abused:   Marland Kitchen Physically Abused:   . Sexually Abused:     Functional Status Survey:    History reviewed. No pertinent family history.  Health Maintenance  Topic Date Due  . COVID-19 Vaccine (2 - Moderna 2-dose series) 02/05/2019  . INFLUENZA VACCINE  08/07/2019  . TETANUS/TDAP  09/20/2026  . DEXA SCAN  Discontinued    Allergies  Allergen Reactions  . Hydrocodone Nausea And Vomiting  . Morphine And Related Nausea And Vomiting  . Actonel [Risedronate Sodium] Other (See Comments)    unknown  . Darvon [Propoxyphene Hcl] Other (See Comments)    unknown  . Oxycodone Hcl Other (See Comments)    unknown  . Pneumovax [Pneumococcal Polysaccharide Vaccine] Other (See Comments)    unknown    Outpatient Encounter Medications  as of 06/28/2019  Medication Sig  . acetaminophen (TYLENOL) 325 MG tablet Take 650 mg by mouth every 4 (four) hours as needed for mild pain.   Marland Kitchen amoxicillin-clavulanate (AUGMENTIN) 875-125 MG tablet Take 1 tablet by mouth every 12 (twelve) hours for 5 days.  . bisacodyl (DULCOLAX) 10 MG suppository Place 10 mg rectally as needed for moderate constipation.  . hydrocortisone 2.5 % lotion Apply topically as needed.  . levETIRAcetam (KEPPRA) 100 MG/ML solution Take 250 mg by mouth 2 (two) times daily.  Marland Kitchen LORazepam (ATIVAN) 0.5 MG tablet Take 0.5 mg by mouth 2 (two) times daily as needed for anxiety.  . metoprolol tartrate (LOPRESSOR) 25 MG tablet Take 12.5 mg by mouth 2 (two) times daily.  Marland Kitchen nystatin (MYCOSTATIN/NYSTOP) powder 1 application. Apply 1 application topically daily as needed under left breast. As Needed  . nystatin cream (MYCOSTATIN) 1  application. Apply 1 application prn TID. Mix with trimcinolone cream and apply to reddened area on back and buttocks with each incontinent episode and prn, then cover with zinc oxide barrier cream. As Needed  . omeprazole (PRILOSEC) 10 MG capsule Take 10 mg by mouth daily.  . Potassium Chloride ER 20 MEQ TBCR Take by mouth. Give 2 tabs to equal 40 mEq Once A Day  . Rivaroxaban (XARELTO) 15 MG TABS tablet Take 15 mg by mouth daily.  Marland Kitchen triamcinolone cream (KENALOG) 0.5 % Apply 1 application topically 3 (three) times daily. Mix with Nystatin cream and apply to reddened areas on back and buttocks with each incontinent episode and prn, then cover with zinc oxide barrier cream.   No facility-administered encounter medications on file as of 06/28/2019.    Review of Systems  Unable to perform ROS: Dementia    Vitals:   06/28/19 1408  BP: (!) 150/80  Pulse: 68  Resp: 20  Temp: 99.3 F (37.4 C)  SpO2: 94%  Weight: 157 lb 1.6 oz (71.3 kg)  Height: 5\' 3"  (1.6 m)   Body mass index is 27.83 kg/m. Physical Exam Vitals reviewed.  Constitutional:       Comments: Keeping her eyes closed but respond to her name  HENT:     Head: Normocephalic.     Nose: Nose normal.     Mouth/Throat:     Mouth: Mucous membranes are moist.     Pharynx: Oropharynx is clear.  Eyes:     Pupils: Pupils are equal, round, and reactive to light.  Cardiovascular:     Rate and Rhythm: Normal rate and regular rhythm.     Pulses: Normal pulses.  Pulmonary:     Effort: Pulmonary effort is normal. No respiratory distress.     Breath sounds: Normal breath sounds. No wheezing or rales.  Abdominal:     General: Abdomen is flat. Bowel sounds are normal. There is no distension.     Palpations: Abdomen is soft.     Tenderness: There is no abdominal tenderness.  Musculoskeletal:        General: No swelling.     Cervical back: Neck supple.  Skin:    General: Skin is warm.  Neurological:     General: No focal deficit present.     Mental Status: She is alert.     Comments: Does not follows any commands. Respond and opens her eyes to her name  Psychiatric:        Mood and Affect: Mood normal.        Thought Content: Thought content normal.     Labs reviewed: Basic Metabolic Panel: Recent Labs    06/21/19 0441 06/22/19 0505 06/23/19 0435  NA 144 139 135  K 3.6 2.8* 2.8*  CL 114* 112* 103  CO2 20* 19* 23  GLUCOSE 93 104* 104*  BUN 11 7* 6*  CREATININE 1.01* 0.82 0.77  CALCIUM 7.2* 7.4* 7.5*  MG 1.8 1.8 2.0   Liver Function Tests: Recent Labs    06/21/19 0441 06/22/19 0505 06/23/19 0435  AST 123* 62* 38  ALT 132* 98* 74*  ALKPHOS 102 116 144*  BILITOT 2.9* 1.8* 1.5*  PROT 5.5* 5.5* 6.1*  ALBUMIN 2.9* 2.8* 3.0*   Recent Labs    07/25/18 1032  LIPASE 34   Recent Labs    06/20/19 1423  AMMONIA 32   CBC: Recent Labs    06/19/19 2200 06/19/19 2200 06/20/19 0555 06/20/19 0555 06/20/19 0658 06/20/19  7989 06/21/19 0441 06/22/19 0505 06/23/19 0435  WBC 7.7   < > SPECIMEN CLOTTED   < > 14.4*   < > 9.3 8.4 7.0  NEUTROABS 7.0  --   SPECIMEN CLOTTED  --  12.9*  --   --   --   --   HGB 15.2*   < > SPECIMEN CLOTTED   < > 12.9   < > 11.6* 11.4* 12.3  HCT 48.4*   < > SPECIMEN CLOTTED   < > 42.3   < > 37.0 35.9* 38.6  MCV 96.2   < > SPECIMEN CLOTTED   < > 100.7*   < > 95.4 93.7 93.2  PLT 222   < > SPECIMEN CLOTTED   < > 210   < > 167 163 158   < > = values in this interval not displayed.   Cardiac Enzymes: No results for input(s): CKTOTAL, CKMB, CKMBINDEX, TROPONINI in the last 8760 hours. BNP: Invalid input(s): POCBNP Lab Results  Component Value Date   HGBA1C 5.5 12/02/2016   Lab Results  Component Value Date   TSH 2.36 06/09/2019   Lab Results  Component Value Date   VITAMINB12 192 07/14/2016   No results found for: FOLATE No results found for: IRON, TIBC, FERRITIN  Imaging and Procedures obtained prior to SNF admission: NM Hepatobiliary Liver Func  Result Date: 06/20/2019 CLINICAL DATA:  Concern for cholecystitis on CT exam. Negative ultrasound exam. No biliary duct dilatation on CT exam. EXAM: NUCLEAR MEDICINE HEPATOBILIARY IMAGING TECHNIQUE: Sequential images of the abdomen were obtained out to 60 minutes following intravenous administration of radiopharmaceutical. RADIOPHARMACEUTICALS:  5.4 mCi Tc-30m  Choletec IV COMPARISON:  None. FINDINGS: Mild delay in radiotracer clearance from the blood pool. There is uniform uptake within liver. The common bile duct is not identified over a 2 hour exam. No activity in the bowel. The gallbladder subsequently does not fill. IMPRESSION: No excretion of radiotracer into the bowel. Bile duct obstruction is not favored as the biliary tree with normal on comparison ultrasound and CT. Therefore favor hepatic dysfunction such as hepatitis. Electronically Signed   By: Suzy Bouchard M.D.   On: 06/20/2019 13:00   CT ABDOMEN PELVIS W CONTRAST  Result Date: 06/20/2019 CLINICAL DATA:  Biliary colic. EXAM: CT ABDOMEN AND PELVIS WITH CONTRAST TECHNIQUE: Multidetector CT imaging of  the abdomen and pelvis was performed using the standard protocol following bolus administration of intravenous contrast. CONTRAST:  49mL OMNIPAQUE IOHEXOL 300 MG/ML  SOLN COMPARISON:  July 13, 2016 FINDINGS: Lower chest: Significant bibasilar airspace disease is noted favored to represent a combination of both atelectasis and consolidation, especially at the right lung base.The heart is enlarged. Hepatobiliary: There is decreased hepatic attenuation suggestive of hepatic steatosis. There appears to be some gallbladder wall thickening and adjacent inflammatory changes. There appears to be pericholecystic free fluid.There is no biliary ductal dilation. Pancreas: Normal contours without ductal dilatation. No peripancreatic fluid collection. Spleen: Unremarkable. Adrenals/Urinary Tract: --Adrenal glands: Unremarkable. --Right kidney/ureter: No hydronephrosis or radiopaque kidney stones. --Left kidney/ureter: There is an at least partially duplicated left-sided collecting system. There is a nonobstructing stone measuring approximately 6 mm in the distal left ureter, nearly at the left UVJ (axial series 2, image 73). --Urinary bladder: Unremarkable. Stomach/Bowel: --Stomach/Duodenum: No hiatal hernia or other gastric abnormality. Normal duodenal course and caliber. --Small bowel: Unremarkable. --Colon: There is a very large amount of stool the level the rectum. There is scattered colonic diverticula without CT evidence for diverticulitis. --Appendix:  Not visualized. No right lower quadrant inflammation or free fluid. Vascular/Lymphatic: Atherosclerotic calcification is present within the non-aneurysmal abdominal aorta, without hemodynamically significant stenosis. --No retroperitoneal lymphadenopathy. --No mesenteric lymphadenopathy. --No pelvic or inguinal lymphadenopathy. Reproductive: Status post hysterectomy. No adnexal mass. Other: No ascites or free air. The abdominal wall is normal. Musculoskeletal. Again noted is a  compression fracture of the T9 vertebral body. There has been significant interval height loss since the patient's prior study dated 07/25/2018. There is no new acute appearing compression fracture. IMPRESSION: 1. Findings are concerning for acute cholecystitis. As this is discordant with the patient's recent ultrasound, correlation with laboratory studies and HIDA scan is recommended. 2. There is a large amount of stool the level of the rectum. 3. There is a nonobstructing stone measuring approximately 6 mm in the distal left ureter, nearly at the left UVJ. 4. Significant bibasilar airspace disease favored to represent a combination of both atelectasis and consolidation, especially at the right lung base. 5. Hepatic steatosis. 6. Cardiomegaly. 7. Again noted is a T9 compression fracture with significant interval height loss since 2020. Aortic Atherosclerosis (ICD10-I70.0). Electronically Signed   By: Constance Holster M.D.   On: 06/20/2019 00:26   DG Chest Port 1 View  Result Date: 06/19/2019 CLINICAL DATA:  Shortness of breath and unresponsiveness EXAM: PORTABLE CHEST 1 VIEW COMPARISON:  07/25/2018 FINDINGS: Cardiac shadow is enlarged but stable. Aortic calcifications are again seen. Overall inspiratory effort is poor however no focal infiltrate is seen. Mild central vascular prominence is noted. No bony abnormality is noted. IMPRESSION: Mild central vascular congestion.  No focal infiltrate is noted. Electronically Signed   By: Inez Catalina M.D.   On: 06/19/2019 22:22   ECHOCARDIOGRAM COMPLETE  Result Date: 06/20/2019    ECHOCARDIOGRAM REPORT   Patient Name:   NOLITA KUTTER Date of Exam: 06/20/2019 Medical Rec #:  [id number]     Height:       63.0 in Accession #:    [id number]    Weight:       154.3 lb Date of Birth:  1927-04-09     BSA:          1.732 m Patient Age:    86 years      BP:           149/60 mmHg Patient Gender: F             HR:           93 bpm. Exam Location:  Inpatient Procedure: 2D Echo,  Cardiac Doppler and Color Doppler Indications:    CHF-Acute Diastolic 546.27 / O35.00  History:        Patient has no prior history of Echocardiogram examinations.                 Stroke; Risk Factors:Hypertension and Non-Smoker. Sepsis. GERD.  Sonographer:    Vickie Epley RDCS Referring Phys: [id number] [place]  Sonographer Comments: Image acquisition challenging due to respiratory motion. IMPRESSIONS  1. Left ventricular ejection fraction, by estimation, is 60 to 65%. The left ventricle has normal function. The left ventricle has no regional wall motion abnormalities. There is mild left ventricular hypertrophy. Left ventricular diastolic parameters are indeterminate.  2. Right ventricular systolic function is normal. The right ventricular size is normal. There is normal pulmonary artery systolic pressure. The estimated right ventricular systolic pressure is 93.7 mmHg.  3. The mitral valve is normal in structure. No evidence of mitral valve regurgitation.  4. The  aortic valve was not well visualized. Aortic valve regurgitation is not visualized. No aortic stenosis is present.  5. The inferior vena cava is normal in size with greater than 50% respiratory variability, suggesting right atrial pressure of 3 mmHg. FINDINGS  Left Ventricle: Left ventricular ejection fraction, by estimation, is 60 to 65%. The left ventricle has normal function. The left ventricle has no regional wall motion abnormalities. The left ventricular internal cavity size was small. There is mild left ventricular hypertrophy. Left ventricular diastolic parameters are indeterminate. Right Ventricle: The right ventricular size is normal. Right vetricular wall thickness was not assessed. Right ventricular systolic function is normal. There is normal pulmonary artery systolic pressure. The tricuspid regurgitant velocity is 2.37 m/s, and with an assumed right atrial pressure of 3 mmHg, the estimated right ventricular systolic pressure is 73.5  mmHg. Left Atrium: Left atrial size was normal in size. Right Atrium: Right atrial size was normal in size. Pericardium: Trivial pericardial effusion is present. Mitral Valve: The mitral valve is normal in structure. No evidence of mitral valve regurgitation. Tricuspid Valve: The tricuspid valve is normal in structure. Tricuspid valve regurgitation is trivial. Aortic Valve: The aortic valve was not well visualized. Aortic valve regurgitation is not visualized. No aortic stenosis is present. Pulmonic Valve: The pulmonic valve was not well visualized. Pulmonic valve regurgitation is trivial. Aorta: The aortic root and ascending aorta are structurally normal, with no evidence of dilitation. Venous: The inferior vena cava is normal in size with greater than 50% respiratory variability, suggesting right atrial pressure of 3 mmHg. IAS/Shunts: The interatrial septum was not well visualized.  LEFT VENTRICLE PLAX 2D LVIDd:         3.10 cm LVIDs:         2.00 cm LV PW:         0.90 cm LV IVS:        0.90 cm LVOT diam:     1.70 cm LV SV:         48 LV SV Index:   28 LVOT Area:     2.27 cm  RIGHT VENTRICLE TAPSE (M-mode): 2.1 cm LEFT ATRIUM             Index       RIGHT ATRIUM           Index LA diam:        3.40 cm 1.96 cm/m  RA Area:     11.60 cm LA Vol (A2C):   26.1 ml 15.07 ml/m RA Volume:   18.80 ml  10.86 ml/m LA Vol (A4C):   23.0 ml 13.28 ml/m LA Biplane Vol: 26.3 ml 15.19 ml/m  AORTIC VALVE LVOT Vmax:   109.00 cm/s LVOT Vmean:  77.800 cm/s LVOT VTI:    0.213 m  AORTA Ao Root diam: 2.90 cm TRICUSPID VALVE TR Peak grad:   22.5 mmHg TR Vmax:        237.00 cm/s  SHUNTS Systemic VTI:  0.21 m Systemic Diam: 1.70 cm Oswaldo Milian MD Electronically signed by Oswaldo Milian MD Signature Date/Time: 06/20/2019/7:41:35 PM    Final    US Abdomen Limited RUQ  Result Date: 06/19/2019 CLINICAL DATA:  Transaminitis EXAM: ULTRASOUND ABDOMEN LIMITED RIGHT UPPER QUADRANT COMPARISON:  None. FINDINGS: Gallbladder: No  gallstones or wall thickening visualized. No sonographic Murphy sign noted by sonographer. Common bile duct: Diameter: Normal caliber, 6 mm. Liver: Increased echotexture compatible with fatty infiltration. No focal abnormality or biliary ductal dilatation. Portal vein is patent on  color Doppler imaging with normal direction of blood flow towards the liver. Other: None. IMPRESSION: Fatty infiltration of the liver. No acute findings. Electronically Signed   By: Rolm Baptise M.D.   On: 06/19/2019 23:29    Assessment/Plan  Severe sepsis with acute organ dysfunction due to Gram negative bacteria (HCC) With Aspiration POneumonia and UTi Responded to Antibitocs On Augmentin and Oxygen  VRE Colonization In Contact Precautions right now   Seizure disorder ([place]) Continue keppra for now  Dysphagia, unspecified type Modified diet Regular Puree diet with Thin liquids Continues to be aspiration risk Hypokalemia Potassium supplement Repeat BMP pending  Essential hypertension On  Metoprolol Protein C deficiency (HCC) Right now continue Xarelto Anxiety and dementia Was taken off Risperdal and Remeron and Zoloft Ativan PRN ACP Enrolled on Hospice. Continuing some of her meds for now as per her daughter   Family/ staff Communication:   Labs/tests ordered:  Total time spent in this patient care encounter was 45 _  minutes; greater than 50% of the visit spent counseling patient and staff, reviewing records , Labs and coordinating care for problems addressed at this encounter.

## 2019-07-07 DIAGNOSIS — R569 Unspecified convulsions: Secondary | ICD-10-CM | POA: Diagnosis not present

## 2019-07-07 DIAGNOSIS — K810 Acute cholecystitis: Secondary | ICD-10-CM | POA: Diagnosis not present

## 2019-07-07 DIAGNOSIS — N201 Calculus of ureter: Secondary | ICD-10-CM | POA: Diagnosis not present

## 2019-07-07 DIAGNOSIS — R1310 Dysphagia, unspecified: Secondary | ICD-10-CM | POA: Diagnosis not present

## 2019-07-07 DIAGNOSIS — R7401 Elevation of levels of liver transaminase levels: Secondary | ICD-10-CM | POA: Diagnosis not present

## 2019-07-07 DIAGNOSIS — E872 Acidosis: Secondary | ICD-10-CM | POA: Diagnosis not present

## 2019-07-07 DIAGNOSIS — F0151 Vascular dementia with behavioral disturbance: Secondary | ICD-10-CM | POA: Diagnosis not present

## 2019-07-07 DIAGNOSIS — R1312 Dysphagia, oropharyngeal phase: Secondary | ICD-10-CM | POA: Diagnosis not present

## 2019-07-14 DIAGNOSIS — R569 Unspecified convulsions: Secondary | ICD-10-CM | POA: Diagnosis not present

## 2019-07-14 DIAGNOSIS — K810 Acute cholecystitis: Secondary | ICD-10-CM | POA: Diagnosis not present

## 2019-07-14 DIAGNOSIS — R1310 Dysphagia, unspecified: Secondary | ICD-10-CM | POA: Diagnosis not present

## 2019-07-14 DIAGNOSIS — F0151 Vascular dementia with behavioral disturbance: Secondary | ICD-10-CM | POA: Diagnosis not present

## 2019-07-14 DIAGNOSIS — R7401 Elevation of levels of liver transaminase levels: Secondary | ICD-10-CM | POA: Diagnosis not present

## 2019-07-14 DIAGNOSIS — E872 Acidosis: Secondary | ICD-10-CM | POA: Diagnosis not present

## 2019-07-14 DIAGNOSIS — R1312 Dysphagia, oropharyngeal phase: Secondary | ICD-10-CM | POA: Diagnosis not present

## 2019-07-14 DIAGNOSIS — N201 Calculus of ureter: Secondary | ICD-10-CM | POA: Diagnosis not present

## 2019-07-20 ENCOUNTER — Non-Acute Institutional Stay (SKILLED_NURSING_FACILITY): Payer: Medicare PPO | Admitting: Nurse Practitioner

## 2019-07-20 ENCOUNTER — Encounter: Payer: Self-pay | Admitting: Nurse Practitioner

## 2019-07-20 DIAGNOSIS — G40909 Epilepsy, unspecified, not intractable, without status epilepticus: Secondary | ICD-10-CM | POA: Diagnosis not present

## 2019-07-20 DIAGNOSIS — I8291 Chronic embolism and thrombosis of unspecified vein: Secondary | ICD-10-CM | POA: Diagnosis not present

## 2019-07-20 DIAGNOSIS — K219 Gastro-esophageal reflux disease without esophagitis: Secondary | ICD-10-CM | POA: Diagnosis not present

## 2019-07-20 DIAGNOSIS — F0151 Vascular dementia with behavioral disturbance: Secondary | ICD-10-CM

## 2019-07-20 DIAGNOSIS — R627 Adult failure to thrive: Secondary | ICD-10-CM | POA: Diagnosis not present

## 2019-07-20 DIAGNOSIS — R6520 Severe sepsis without septic shock: Secondary | ICD-10-CM

## 2019-07-20 DIAGNOSIS — A4150 Gram-negative sepsis, unspecified: Secondary | ICD-10-CM | POA: Diagnosis not present

## 2019-07-20 DIAGNOSIS — I10 Essential (primary) hypertension: Secondary | ICD-10-CM | POA: Diagnosis not present

## 2019-07-20 DIAGNOSIS — I639 Cerebral infarction, unspecified: Secondary | ICD-10-CM | POA: Diagnosis not present

## 2019-07-20 DIAGNOSIS — F331 Major depressive disorder, recurrent, moderate: Secondary | ICD-10-CM

## 2019-07-20 DIAGNOSIS — F01518 Vascular dementia, unspecified severity, with other behavioral disturbance: Secondary | ICD-10-CM

## 2019-07-20 DIAGNOSIS — R1310 Dysphagia, unspecified: Secondary | ICD-10-CM

## 2019-07-20 DIAGNOSIS — E876 Hypokalemia: Secondary | ICD-10-CM

## 2019-07-20 NOTE — Assessment & Plan Note (Signed)
End stage, continue supportive care, under Hospice service.

## 2019-07-20 NOTE — Assessment & Plan Note (Addendum)
Afebrile, fully treated, contact precaution

## 2019-07-20 NOTE — Assessment & Plan Note (Signed)
Blood pressure is controlled, continue Metoprolol. 

## 2019-07-20 NOTE — Assessment & Plan Note (Signed)
Continue Xarelto in setting of Protein C/S deficiency.

## 2019-07-20 NOTE — Progress Notes (Signed)
Location:   SNF [facility] Room Number: 403 KVQQV of Service:  SNF (31) Provider: Lennie Odor Ziare Orrick NP  Jeanett Antonopoulos X, NP  Patient Care Team: Gurkirat Basher X, NP as PCP - General (Internal Medicine) Teddi Badalamenti X, NP as Nurse Practitioner (Internal Medicine) Dunn, Warnell Bureau as Consulting Physician (Optometry) Monna Fam, MD as Consulting Physician (Ophthalmology) Lucky Cowboy, Gordy Clement, NP as Nurse Practitioner (Hospice and Palliative Medicine) Virgie Dad, MD as Consulting Physician (Internal Medicine)  Extended Emergency Contact Information Primary Emergency Contact: [facility] Address: [address], [place] [address] [name] of [place] Phone: [phone] Work Phone: [phone] Mobile Phone: [phone] Relation: Daughter Secondary Emergency Contact: Jenne Campus Home Phone: [phone] Relation: Daughter  Code Status:  DNR Goals of care: Advanced Directive information Advanced Directives 07/20/2019  Does Patient Have a Medical Advance Directive? Yes  Type of Paramedic of [place];Living will;Out of facility DNR (pink MOST or yellow form)  Does patient want to make changes to medical advance directive? No - Patient declined  Copy of [place] in Chart? Yes - validated most recent copy scanned in chart (See row information)  Pre-existing out of facility DNR order (yellow form or pink MOST form) Yellow form placed in chart (order not valid for inpatient use)     Chief Complaint  Patient presents with   Medical Management of Chronic Issues    Routine visit    HPI:  Pt is a 86 y.o. female seen today for medical management of chronic diseases.      Hypokalemia, takes Kcl  FTT, comfort measures  Dysphagia, comfort feeding  DVT, protein C/S deficiency, takes Xarelto  Hx of CVA, R sided weakness  Seizures, tales Keppra  GERD, takes Omeprazole  HTN, takes Metoprolol  Depression,  takes Lorazepam  VRE colonization, contact precaution.   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    right   Past Surgical History:  Procedure Laterality Date   ABDOMINAL HYSTERECTOMY     APPENDECTOMY     BREAST SURGERY     MASTECTOMY Right     Allergies  Allergen Reactions   Hydrocodone Nausea And Vomiting   Morphine And Related Nausea And Vomiting   Actonel [Risedronate Sodium] Other (See Comments)    unknown   Darvon [Propoxyphene Hcl] Other (See Comments)    unknown   Oxycodone Hcl Other (See Comments)    unknown   Pneumovax [Pneumococcal Polysaccharide Vaccine] Other (See Comments)    unknown    Allergies as of 07/20/2019      Reactions   Hydrocodone Nausea And Vomiting   Morphine And Related Nausea And Vomiting   Actonel [risedronate Sodium] Other (See Comments)   unknown   Darvon [propoxyphene Hcl] Other (See Comments)   unknown   Oxycodone Hcl  Other (See Comments)   unknown   Pneumovax [pneumococcal Polysaccharide Vaccine] Other (See Comments)   unknown      Medication List       Accurate as of July 20, 2019 11:59 PM. If you have any questions, ask your nurse or doctor.        STOP taking these medications   potassium chloride SA 20 MEQ tablet Commonly known as: KLOR-CON Stopped by: Tynika Luddy X Dea Bitting, NP     TAKE these medications   acetaminophen 325 MG  tablet Commonly known as: TYLENOL Take 650 mg by mouth every 4 (four) hours as needed for mild pain.   acetaminophen 500 MG tablet Commonly known as: TYLENOL Take 500 mg by mouth 4 (four) times daily. 1 tablet, oral, Four Times A Day, NOT TO EXCEED 3000MG /24 HOURS   bisacodyl 10 MG suppository Commonly known as: DULCOLAX Place 10 mg rectally as needed for moderate constipation.   hydrocortisone 2.5 % lotion Apply topically as needed.   levETIRAcetam 100 MG/ML solution Commonly known as: KEPPRA Take 250 mg by mouth 2 (two) times daily.   LORazepam 0.5 MG tablet Commonly known as: ATIVAN Take 0.5 mg by mouth 2 (two) times daily as needed for anxiety.   metoprolol tartrate 25 MG tablet Commonly known as: LOPRESSOR Take 12.5 mg by mouth 2 (two) times daily.   nystatin cream Commonly known as: MYCOSTATIN 1 application. Apply 1 application prn TID. Mix with trimcinolone cream and apply to reddened area on back and buttocks with each incontinent episode and prn, then cover with zinc oxide barrier cream. As Needed   nystatin powder Commonly known as: MYCOSTATIN/NYSTOP 1 application. Apply 1 application topically daily as needed under left breast. As Needed   omeprazole 10 MG capsule Commonly known as: PRILOSEC Take 10 mg by mouth daily.   Potassium Chloride ER 20 MEQ Tbcr Take by mouth. Give 2 tabs to equal 40 mEq Once A Day   Rivaroxaban 15 MG Tabs tablet Commonly known as: XARELTO Take 15 mg by mouth daily.   triamcinolone cream 0.5 % Commonly known as: KENALOG Apply 1 application topically 3 (three) times daily. Mix with Nystatin cream and apply to reddened areas on back and buttocks with each incontinent episode and prn, then cover with zinc oxide barrier cream.       Review of Systems  Constitutional: Positive for fatigue. Negative for fever.  HENT: Positive for hearing loss and trouble swallowing. Negative for congestion.   Eyes: Negative for visual  disturbance.  Respiratory: Positive for shortness of breath. Negative for cough.        O2 2lpm via [place]  Cardiovascular: Negative for chest pain, palpitations and leg swelling.  Gastrointestinal: Negative for abdominal pain, constipation, nausea and vomiting.  Genitourinary: Negative for dysuria, hematuria and urgency.       Incontinent of urine.   Musculoskeletal: Positive for arthralgias.       No longer ambulatory.   Skin: Negative for color change.  Neurological: Positive for speech difficulty and weakness. Negative for seizures and light-headedness.       Dementia. Expressive aphasia. Right side weakness with muscle strength 5/5  Psychiatric/Behavioral: Negative for dysphoric mood, hallucinations and sleep disturbance. The patient is not nervous/anxious.     Immunization History  Administered Date(s) Administered   Influenza Whole 10/19/2017   Influenza, High Dose Seasonal PF 10/08/2018   Influenza-Unspecified 11/20/2013, 09/26/2014, 10/24/2015, 11/03/2016   Moderna SARS-COVID-2 Vaccination 01/08/2019   Tdap 09/19/2016   Zoster 01/07/2012  Pertinent  Health Maintenance Due  Topic Date Due   INFLUENZA VACCINE  08/07/2019   DEXA SCAN  Discontinued   Fall Risk  09/15/2017 09/05/2016  Falls in the past year? No No   Functional Status Survey:    Vitals:   07/20/19 1625  BP: (!) 146/84  Pulse: 85  Resp: 20  Temp: (!) 97.5 F (36.4 C)  SpO2: 97%  Weight: 157 lb 9.6 oz (71.5 kg)  Height: 5\' 3"  (1.6 m)   Body mass index is 27.92 kg/m. Physical Exam Vitals and nursing note reviewed.  HENT:     Head: Normocephalic and atraumatic.     Mouth/Throat:     Mouth: Mucous membranes are dry.  Eyes:     Extraocular Movements: Extraocular movements intact.     Conjunctiva/sclera: Conjunctivae normal.     Pupils: Pupils are equal, round, and reactive to light.  Cardiovascular:     Rate and Rhythm: Normal rate and regular rhythm.     Heart sounds: No murmur heard.    Pulmonary:     Breath sounds: No rales.     Comments: O2 via [place] Abdominal:     General: Bowel sounds are normal.     Palpations: Abdomen is soft.     Tenderness: There is no abdominal tenderness.  Musculoskeletal:     Cervical back: Normal range of motion and neck supple.     Right lower leg: No edema.     Left lower leg: No edema.     Comments: Trace edema BLE  Skin:    General: Skin is warm and dry.  Neurological:     Mental Status: She is alert.     Comments: Not oriented to person, place, and times. Followed simple direction to open eyes, answered to yes or no question, smiled.   Psychiatric:     Comments: Confused, but answered to yes or no questions, smiled, opened eyes when spoke to      Labs reviewed: Recent Labs    06/21/19 0441 06/22/19 0505 06/23/19 0435  NA 144 139 135  K 3.6 2.8* 2.8*  CL 114* 112* 103  CO2 20* 19* 23  GLUCOSE 93 104* 104*  BUN 11 7* 6*  CREATININE 1.01* 0.82 0.77  CALCIUM 7.2* 7.4* 7.5*  MG 1.8 1.8 2.0   Recent Labs    06/21/19 0441 06/22/19 0505 06/23/19 0435  AST 123* 62* 38  ALT 132* 98* 74*  ALKPHOS 102 116 144*  BILITOT 2.9* 1.8* 1.5*  PROT 5.5* 5.5* 6.1*  ALBUMIN 2.9* 2.8* 3.0*   Recent Labs    06/19/19 2200 06/19/19 2200 06/20/19 0555 06/20/19 0555 06/20/19 0658 06/20/19 0658 06/21/19 0441 06/22/19 0505 06/23/19 0435  WBC 7.7   < > SPECIMEN CLOTTED   < > 14.4*   < > 9.3 8.4 7.0  NEUTROABS 7.0  --  SPECIMEN CLOTTED  --  12.9*  --   --   --   --   HGB 15.2*   < > SPECIMEN CLOTTED   < > 12.9   < > 11.6* 11.4* 12.3  HCT 48.4*   < > SPECIMEN CLOTTED   < > 42.3   < > 37.0 35.9* 38.6  MCV 96.2   < > SPECIMEN CLOTTED   < > 100.7*   < > 95.4 93.7 93.2  PLT 222   < > SPECIMEN CLOTTED   < > 210   < > 167 163 158   < > = values  in this interval not displayed.   Lab Results  Component Value Date   TSH 2.36 06/09/2019   Lab Results  Component Value Date   HGBA1C 5.5 12/02/2016   Lab Results  Component Value Date    CHOL 174 10/21/2016   HDL 45 10/21/2016   LDLCALC 103 10/21/2016   TRIG 158 10/21/2016    Significant Diagnostic Results in last 30 days:  DG Chest Port 1 View  Result Date: 06/22/2019 CLINICAL DATA:  Dyspnea EXAM: PORTABLE CHEST 1 VIEW COMPARISON:  06/19/2019 FINDINGS: Cardiac shadow is stable. Mild vascular congestion is again seen and slightly increased. Mild interstitial edema is now noted. Patchy increased density in the left base is noted consistent with mild atelectatic change. No bony abnormality is noted. IMPRESSION: Persistent and slightly increased vascular congestion with mild edema. Mild left basilar atelectasis. Electronically Signed   By: Inez Catalina M.D.   On: 06/22/2019 12:43    Assessment/Plan  Chronic embolism and thrombosis of unspecified vein Continue Xarelto in setting of Protein C/S deficiency.   CVA (cerebral vascular accident) Slightly right sided weakne. Left parietal and occipital hemorrhagic stroke 11/2011  Essential hypertension Blood pressure is controlled, continue Metoprolol.   Dysphagia Comfort feeding.   GERD without esophagitis Stable, continue Omeprazole.   Seizure disorder (HCC) Stable, continue Keppra.   Vascular dementia with behavior disturbance (HCC) End stage, continue supportive care, under Hospice service.   Adult failure to thrive Continue supportive measures  Gram-negative sepsis with organ dysfunction (HCC) Afebrile, fully treated, contact precaution  Hypokalemia Continue Kcl supplement, serum K 3.6 06/28/19 at [facility]  Major depressive disorder, recurrent ([place]) Continue prn Lorazepam   Family/ staff Communication: plan of care reviewed with the patient, the patient's daughter, and charge nurse  Labs/tests ordered:  none  Time spend 25 minutes.

## 2019-07-20 NOTE — Assessment & Plan Note (Signed)
Continue Kcl supplement, serum K 3.6 06/28/19 at [facility] [place]

## 2019-07-20 NOTE — Assessment & Plan Note (Signed)
Slightly right sided weakne. Left parietal and occipital hemorrhagic stroke 11/2011

## 2019-07-20 NOTE — Assessment & Plan Note (Signed)
Continue prn Lorazepam.  °

## 2019-07-20 NOTE — Assessment & Plan Note (Signed)
Comfort feeding.  °

## 2019-07-20 NOTE — Assessment & Plan Note (Signed)
Stable, continue Keppra 

## 2019-07-20 NOTE — Assessment & Plan Note (Addendum)
Stable, continue Omeprazole.  

## 2019-07-20 NOTE — Assessment & Plan Note (Signed)
Continue supportive measures

## 2019-07-21 ENCOUNTER — Encounter: Payer: Self-pay | Admitting: Nurse Practitioner

## 2019-08-01 ENCOUNTER — Non-Acute Institutional Stay (SKILLED_NURSING_FACILITY): Payer: Medicare PPO | Admitting: Nurse Practitioner

## 2019-08-01 ENCOUNTER — Encounter: Payer: Self-pay | Admitting: Nurse Practitioner

## 2019-08-01 DIAGNOSIS — K219 Gastro-esophageal reflux disease without esophagitis: Secondary | ICD-10-CM

## 2019-08-01 DIAGNOSIS — I10 Essential (primary) hypertension: Secondary | ICD-10-CM

## 2019-08-01 DIAGNOSIS — G40909 Epilepsy, unspecified, not intractable, without status epilepticus: Secondary | ICD-10-CM | POA: Diagnosis not present

## 2019-08-01 DIAGNOSIS — F0151 Vascular dementia with behavioral disturbance: Secondary | ICD-10-CM | POA: Diagnosis not present

## 2019-08-01 DIAGNOSIS — I8291 Chronic embolism and thrombosis of unspecified vein: Secondary | ICD-10-CM

## 2019-08-01 DIAGNOSIS — F01518 Vascular dementia, unspecified severity, with other behavioral disturbance: Secondary | ICD-10-CM

## 2019-08-01 DIAGNOSIS — R21 Rash and other nonspecific skin eruption: Secondary | ICD-10-CM

## 2019-08-01 NOTE — Assessment & Plan Note (Signed)
Stable, continue Keppra 

## 2019-08-01 NOTE — Assessment & Plan Note (Signed)
Blood pressure is controlled, continue Metoprolol. 

## 2019-08-01 NOTE — Progress Notes (Signed)
Location:   SNF [place] Room Number: 098 JXBJY of Service:  SNF (31) Provider: Lennie Odor Azizah Lisle NP  Addis Tuohy X, NP  Patient Care Team: Shalisa Mcquade X, NP as PCP - General (Internal Medicine) Wilburn Keir X, NP as Nurse Practitioner (Internal Medicine) Dunn, Warnell Bureau as Consulting Physician (Optometry) Monna Fam, MD as Consulting Physician (Ophthalmology) Lucky Cowboy, Gordy Clement, NP as Nurse Practitioner (Hospice and Palliative Medicine) Virgie Dad, MD as Consulting Physician (Internal Medicine)  Extended Emergency Contact Information Primary Emergency Contact: [facility] Address: [address], [place] [address] [name] of [place] Phone: [phone] Work Phone: [phone] Mobile Phone: [phone] Relation: Daughter Secondary Emergency Contact: Jenne Campus Home Phone: [phone] Relation: Daughter  Code Status: DNR Goals of care: Advanced Directive information Advanced Directives 08/01/2019  Does Patient Have a Medical Advance Directive? Yes  Type of Paramedic of [place];Living will;Out of facility DNR (pink MOST or yellow form)  Does patient want to make changes to medical advance directive? No - Patient declined  Copy of [place] in Chart? Yes - validated most recent copy scanned in chart (See row information)  Pre-existing out of facility DNR order (yellow form or pink MOST form) Yellow form placed in chart (order not valid for inpatient use)     Chief Complaint  Patient presents with  . Acute Visit    Skin breakdown;buttocks    HPI:  Pt is a 86 y.o. female seen today for an acute visit for persisted heat rash in back and buttocks, Nystatin cream, 2.5% Hydrocortisone cream q shift presently.    Comfort measure is her goal of care, prn Lorazepam, Tylenol,   HTN, takes Metoprolol  GERD takes Omeprazole  VTE/protein S/C deficiency, takes Rivaroxaban  Hypokalemia,  takes Kcl   Seizures, takes Keppra     Past Medical History:  Diagnosis Date  . Acute encephalopathy   . Aphasia following unspecified cerebrovascular disease   . 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 Depression   . Dysphagia following cerebrovascular disease   . Hepatic steatosis   . Hydroureteronephrosis   . Hypertension   . Insomnia   . Macular degeneration   . Major depressive disorder, recurrent ([place])   . Neuralgia and neuritis, unspecified (CODE)   . Nontraumatic intracranial hemorrhage ([place])   . Stroke ([place])   . 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   . 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 ABDOMINAL HYSTERECTOMY    . APPENDECTOMY    . BREAST SURGERY    . MASTECTOMY Right     Allergies  Allergen Reactions  . Hydrocodone Nausea And Vomiting  . Morphine And Related Nausea And Vomiting  . Actonel [Risedronate Sodium] Other (See Comments)    unknown  . Darvon [Propoxyphene Hcl] Other (See Comments)    unknown  . Oxycodone Hcl Other (See Comments)    unknown  . Pneumovax [Pneumococcal Polysaccharide Vaccine] Other (See Comments)    unknown    Allergies as of 08/01/2019      Reactions   Hydrocodone Nausea And Vomiting   Morphine And Related Nausea And Vomiting   Actonel [risedronate Sodium] Other (See Comments)   unknown   Darvon [propoxyphene Hcl] Other (See Comments)   unknown   Oxycodone Hcl Other (  See Comments)   unknown   Pneumovax [pneumococcal Polysaccharide Vaccine] Other (See Comments)   unknown      Medication List       Accurate as of August 01, 2019 11:59 PM. If you have any questions, ask your nurse or doctor.        STOP taking these medications   LORazepam 0.5 MG tablet Commonly known as: ATIVAN Stopped by: Jama Krichbaum X Lonnie Reth, NP     TAKE these medications   acetaminophen 325 MG tablet Commonly known as:  TYLENOL Take 650 mg by mouth every 4 (four) hours as needed for mild pain.   acetaminophen 500 MG tablet Commonly known as: TYLENOL Take 500 mg by mouth 4 (four) times daily. 1 tablet, oral, Four Times A Day, NOT TO EXCEED 3000MG /24 HOURS   bisacodyl 10 MG suppository Commonly known as: DULCOLAX Place 10 mg rectally as needed for moderate constipation.   hydrocortisone 2.5 % lotion Apply topically as needed.   levETIRAcetam 100 MG/ML solution Commonly known as: KEPPRA Take 250 mg by mouth 2 (two) times daily.   metoprolol tartrate 25 MG tablet Commonly known as: LOPRESSOR Take 12.5 mg by mouth 2 (two) times daily.   nystatin cream Commonly known as: MYCOSTATIN 1 application. Apply 1 application prn TID. Mix with trimcinolone cream and apply to reddened area on back and buttocks with each incontinent episode and prn, then cover with zinc oxide barrier cream. As Needed   nystatin powder Commonly known as: MYCOSTATIN/NYSTOP 1 application. Apply 1 application topically daily as needed under left breast. As Needed   omeprazole 10 MG capsule Commonly known as: PRILOSEC Take 10 mg by mouth daily.   Potassium Chloride ER 20 MEQ Tbcr Take by mouth. Give 2 tabs to equal 40 mEq Once A Day   Rivaroxaban 15 MG Tabs tablet Commonly known as: XARELTO Take 15 mg by mouth daily.   triamcinolone cream 0.5 % Commonly known as: KENALOG Apply 1 application topically 3 (three) times daily. Mix with Nystatin cream and apply to reddened areas on back and buttocks with each incontinent episode and prn, then cover with zinc oxide barrier cream.       Review of Systems  Constitutional: Positive for fatigue. Negative for fever.  HENT: Positive for hearing loss and trouble swallowing. Negative for congestion.   Eyes: Negative for visual disturbance.  Respiratory: Positive for shortness of breath. Negative for cough.        O2 2lpm via [place]  Cardiovascular: Negative for chest pain,  palpitations and leg swelling.  Gastrointestinal: Negative for abdominal pain, constipation, nausea and vomiting.  Genitourinary: Negative for dysuria, hematuria and urgency.       Incontinent of urine.   Musculoskeletal: Positive for arthralgias.       No longer ambulatory.   Skin: Positive for rash. Negative for color change.  Neurological: Positive for speech difficulty and weakness. Negative for seizures and light-headedness.       Dementia. Expressive aphasia. Right side weakness with muscle strength 5/5  Psychiatric/Behavioral: Negative for dysphoric mood, hallucinations and sleep disturbance. The patient is not nervous/anxious.     Immunization History  Administered Date(s) Administered  . Influenza Whole 10/19/2017  . Influenza, High Dose Seasonal PF 10/08/2018  . Influenza-Unspecified 11/20/2013, 09/26/2014, 10/24/2015, 11/03/2016  . Moderna SARS-COVID-2 Vaccination 01/08/2019  . Tdap 09/19/2016  . Zoster 01/07/2012   Pertinent  Health Maintenance Due  Topic Date Due  . INFLUENZA VACCINE  08/07/2019  . DEXA SCAN  Discontinued  Fall Risk  09/15/2017 09/05/2016  Falls in the past year? No No   Functional Status Survey:    Vitals:   08/01/19 1049  BP: (!) 140/70  Pulse: 76  Resp: 20  Temp: 98.2 F (36.8 C)  SpO2: 95%  Weight: 157 lb 9.6 oz (71.5 kg)  Height: 5\' 3"  (1.6 m)   Body mass index is 27.92 kg/m. Physical Exam Vitals and nursing note reviewed.  HENT:     Head: Normocephalic and atraumatic.     Mouth/Throat:     Mouth: Mucous membranes are dry.  Eyes:     Extraocular Movements: Extraocular movements intact.     Conjunctiva/sclera: Conjunctivae normal.     Pupils: Pupils are equal, round, and reactive to light.  Cardiovascular:     Rate and Rhythm: Normal rate and regular rhythm.     Heart sounds: No murmur heard.   Pulmonary:     Breath sounds: No rales.     Comments: O2 via [place] Abdominal:     General: Bowel sounds are normal.     Palpations:  Abdomen is soft.     Tenderness: There is no abdominal tenderness.  Musculoskeletal:     Cervical back: Normal range of motion and neck supple.     Right lower leg: No edema.     Left lower leg: No edema.     Comments: Trace edema BLE  Skin:    General: Skin is warm and dry.     Findings: Rash present.     Comments: Red maculopapular scattered satellite patterned rash back/buttokcs.   Neurological:     Mental Status: She is alert.     Comments: Not oriented to person, place, and times. Followed simple direction to open eyes, answered to yes or no question, smiled.   Psychiatric:     Comments: Confused, but answered to yes or no questions, smiled, opened eyes when spoke to      Labs reviewed: Recent Labs    06/21/19 0441 06/21/19 0441 06/22/19 0505 06/23/19 0435 06/28/19 0000  NA 144   < > 139 135 146  K 3.6   < > 2.8* 2.8* 3.6  CL 114*  --  112* 103  --   CO2 20*   < > 19* 23 26*  GLUCOSE 93  --  104* 104*  --   BUN 11   < > 7* 6* 11  CREATININE 1.01*   < > 0.82 0.77 0.8  CALCIUM 7.2*   < > 7.4* 7.5* 8.2*  MG 1.8  --  1.8 2.0  --    < > = values in this interval not displayed.   Recent Labs    06/21/19 0441 06/22/19 0505 06/23/19 0435  AST 123* 62* 38  ALT 132* 98* 74*  ALKPHOS 102 116 144*  BILITOT 2.9* 1.8* 1.5*  PROT 5.5* 5.5* 6.1*  ALBUMIN 2.9* 2.8* 3.0*   Recent Labs    06/19/19 2200 06/19/19 2200 06/20/19 0555 06/20/19 0555 06/20/19 0658 06/20/19 0658 06/21/19 0441 06/22/19 0505 06/23/19 0435  WBC 7.7   < > SPECIMEN CLOTTED   < > 14.4*   < > 9.3 8.4 7.0  NEUTROABS 7.0  --  SPECIMEN CLOTTED  --  12.9*  --   --   --   --   HGB 15.2*   < > SPECIMEN CLOTTED   < > 12.9   < > 11.6* 11.4* 12.3  HCT 48.4*   < > SPECIMEN CLOTTED   < >  42.3   < > 37.0 35.9* 38.6  MCV 96.2   < > SPECIMEN CLOTTED   < > 100.7*   < > 95.4 93.7 93.2  PLT 222   < > SPECIMEN CLOTTED   < > 210   < > 167 163 158   < > = values in this interval not displayed.   Lab Results   Component Value Date   TSH 2.36 06/09/2019   Lab Results  Component Value Date   HGBA1C 5.5 12/02/2016   Lab Results  Component Value Date   CHOL 174 10/21/2016   HDL 45 10/21/2016   LDLCALC 103 10/21/2016   TRIG 158 10/21/2016    Significant Diagnostic Results in last 30 days:  No results found.  Assessment/Plan: Chronic embolism and thrombosis of unspecified vein In setting of Protein C/S deficiency, continue Rivaroxaban.   Essential hypertension Blood pressure is controlled, continue Metoprolol.   GERD without esophagitis Stable, continue Omeprazole.   Seizure disorder ([place]) Stable, continue Keppra  Vascular dementia with behavior disturbance ([place]) Advanced, comfort measures, continue Hospice service, continue Lorazepam prn, Tylenol   Rash Persisted back, buttock satellite pattern red papules, heat and moist are contributory, will apply 0.5% Triamcinolone cream/Nystatin cream q shift, turn from side to side q 2-3 hours until rash is healed, leave adult depends off while in bed. Observe     Family/ staff Communication: plan of care reviewed with the patient and charge nurse.   Labs/tests ordered: none  Time spend 25 minutes.

## 2019-08-01 NOTE — Assessment & Plan Note (Signed)
Persisted back, buttock satellite pattern red papules, heat and moist are contributory, will apply 0.5% Triamcinolone cream/Nystatin cream q shift, turn from side to side q 2-3 hours until rash is healed, leave adult depends off while in bed. Observe

## 2019-08-01 NOTE — Assessment & Plan Note (Signed)
Stable, continue Omeprazole.  

## 2019-08-01 NOTE — Assessment & Plan Note (Signed)
Advanced, comfort measures, continue Hospice service, continue Lorazepam prn, Tylenol

## 2019-08-01 NOTE — Assessment & Plan Note (Signed)
In setting of Protein C/S deficiency, continue Rivaroxaban.

## 2019-08-02 ENCOUNTER — Encounter: Payer: Self-pay | Admitting: Nurse Practitioner

## 2019-08-18 DIAGNOSIS — M6281 Muscle weakness (generalized): Secondary | ICD-10-CM | POA: Diagnosis not present

## 2019-08-18 LAB — BASIC METABOLIC PANEL
BUN: 11 (ref 4–21)
CO2: 19 (ref 13–22)
Chloride: 114 — AB (ref 99–108)
Creatinine: 0.8 (ref 0.5–1.1)
Glucose: 82
Potassium: 3.2 — AB (ref 3.4–5.3)
Sodium: 145 (ref 137–147)

## 2019-08-18 LAB — COMPREHENSIVE METABOLIC PANEL: Calcium: 8.9 (ref 8.7–10.7)

## 2019-09-06 ENCOUNTER — Encounter: Payer: Self-pay | Admitting: Nurse Practitioner

## 2019-09-06 ENCOUNTER — Non-Acute Institutional Stay (SKILLED_NURSING_FACILITY): Payer: Medicare Other | Admitting: Nurse Practitioner

## 2019-09-06 DIAGNOSIS — K219 Gastro-esophageal reflux disease without esophagitis: Secondary | ICD-10-CM | POA: Diagnosis not present

## 2019-09-06 DIAGNOSIS — I8291 Chronic embolism and thrombosis of unspecified vein: Secondary | ICD-10-CM

## 2019-09-06 DIAGNOSIS — I10 Essential (primary) hypertension: Secondary | ICD-10-CM | POA: Diagnosis not present

## 2019-09-06 DIAGNOSIS — G40909 Epilepsy, unspecified, not intractable, without status epilepticus: Secondary | ICD-10-CM

## 2019-09-06 DIAGNOSIS — E876 Hypokalemia: Secondary | ICD-10-CM | POA: Diagnosis not present

## 2019-09-06 NOTE — Assessment & Plan Note (Signed)
Stable, continue Omeprazole.  

## 2019-09-06 NOTE — Progress Notes (Signed)
Location:   SNF [place] Room Number: N 109/A Place of Service:  SNF (31) Provider: Lennie Odor Kimble Delaurentis NP  Lissett Favorite X, NP  Patient Care Team: Breanah Faddis X, NP as PCP - General (Internal Medicine) Aedan Geimer X, NP as Nurse Practitioner (Internal Medicine) Dunn, Warnell Bureau as Consulting Physician (Optometry) Monna Fam, MD as Consulting Physician (Ophthalmology) Lucky Cowboy, Gordy Clement, NP as Nurse Practitioner (Hospice and Palliative Medicine) Virgie Dad, MD as Consulting Physician (Internal Medicine)  Extended Emergency Contact Information Primary Emergency Contact: [facility] Address: [address], [place] [address] [name] of [place] Phone: [phone] Work Phone: [phone] Mobile Phone: [phone] Relation: Daughter Secondary Emergency Contact: Jenne Campus Home Phone: [phone] Relation: Daughter  Code Status:  DNR Goals of care: Advanced Directive information Advanced Directives 09/06/2019  Does Patient Have a Medical Advance Directive? Yes  Type of Paramedic of [place];Living will;Out of facility DNR (pink MOST or yellow form)  Does patient want to make changes to medical advance directive? No - Patient declined  Copy of [place] in Chart? Yes - validated most recent copy scanned in chart (See row information)  Pre-existing out of facility DNR order (yellow form or pink MOST form) Yellow form placed in chart (order not valid for inpatient use)     Chief Complaint  Patient presents with   Medical Management of Chronic Issues    Routine Visit of Medical Management   Immunizations    Covid Vaccine second injection    HPI:  Pt is a 86 y.o. female seen today for medical management of chronic diseases.    AFF. Comfort measure is her goal of care, prn Lorazepam, Tylenol             HTN, takes Metoprolol             GERD takes Omeprazole             VTE/protein  S/C deficiency, takes Rivaroxaban             Hypokalemia, takes Kcl              Seizures, takes Keppra    Past Medical History:  Diagnosis Date   Acute encephalopathy    Aphasia following unspecified cerebrovascular disease    Chronic embolism and thrombosis of unspecified vein    Depression    Dysphagia following cerebrovascular disease    Hepatic steatosis    Hydroureteronephrosis    Hypertension    Insomnia    Macular degeneration    Major depressive disorder, recurrent (HCC)    Neuralgia and neuritis, unspecified (CODE)    Nontraumatic intracranial hemorrhage (HCC)    Stroke ([place])    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    right   Past Surgical History:  Procedure Laterality Date   ABDOMINAL HYSTERECTOMY     APPENDECTOMY     BREAST SURGERY     MASTECTOMY Right     Allergies  Allergen Reactions   Hydrocodone Nausea And Vomiting   Morphine And Related Nausea And Vomiting   Actonel [Risedronate Sodium] Other (See Comments)    unknown   Darvon [Propoxyphene Hcl] Other (See Comments)    unknown   Oxycodone Hcl Other (See Comments)    unknown   Pneumovax [Pneumococcal Polysaccharide Vaccine] Other (See Comments)  unknown    Allergies as of 09/06/2019      Reactions   Hydrocodone Nausea And Vomiting   Morphine And Related Nausea And Vomiting   Actonel [risedronate Sodium] Other (See Comments)   unknown   Darvon [propoxyphene Hcl] Other (See Comments)   unknown   Oxycodone Hcl Other (See Comments)   unknown   Pneumovax [pneumococcal Polysaccharide Vaccine] Other (See Comments)   unknown      Medication List       Accurate as of September 06, 2019 11:59 PM. If you have any questions, ask your nurse or doctor.        acetaminophen 325 MG tablet Commonly known as: TYLENOL Take 650 mg by mouth every 4 (four) hours as needed for mild pain.     acetaminophen 500 MG tablet Commonly known as: TYLENOL Take 500 mg by mouth 4 (four) times daily. 1 tablet, oral, Four Times A Day, NOT TO EXCEED 3000MG /24 HOURS   Ativan 0.5 MG tablet Generic drug: LORazepam Take 0.5 mg by mouth every 6 (six) hours as needed for anxiety.   bisacodyl 10 MG suppository Commonly known as: DULCOLAX Place 10 mg rectally as needed for moderate constipation.   hydrocortisone 2.5 % lotion Apply 1 application topically 2 (two) times daily as needed. To Face   levETIRAcetam 100 MG/ML solution Commonly known as: KEPPRA Take 250 mg by mouth 2 (two) times daily.   metoprolol tartrate 25 MG tablet Commonly known as: LOPRESSOR Take 12.5 mg by mouth 2 (two) times daily.   nystatin cream Commonly known as: MYCOSTATIN 1 application. Apply 1 application prn TID. Mix with trimcinolone cream and apply to reddened area on back and buttocks with each incontinent episode and prn, then cover with zinc oxide barrier cream. As Needed   nystatin powder Commonly known as: MYCOSTATIN/NYSTOP 1 application. Apply 1 application topically daily as needed under left breast. As Needed   nystatin cream Commonly known as: MYCOSTATIN Apply 1 application topically 3 (three) times daily. Special Instructions: Apply to back and buttocks   omeprazole 10 MG capsule Commonly known as: PRILOSEC Take 10 mg by mouth daily.   Potassium Chloride ER 20 MEQ Tbcr Take 40 mg by mouth daily. Give 2 tabs to equal 40 mEq Once A Day   Rivaroxaban 15 MG Tabs tablet Commonly known as: XARELTO Take 15 mg by mouth daily.   triamcinolone cream 0.5 % Commonly known as: KENALOG Apply 1 application topically 3 (three) times daily. Mix with Nystatin cream and apply to reddened areas on back and buttocks with each incontinent episode and prn, then cover with zinc oxide barrier cream.   triamcinolone cream 0.5 % Commonly known as: KENALOG Apply 1 application topically 3 (three) times daily.        Review of Systems  Constitutional: Positive for appetite change and fatigue. Negative for fever.  HENT: Positive for hearing loss and trouble swallowing. Negative for congestion.   Eyes: Negative for visual disturbance.  Respiratory: Positive for shortness of breath. Negative for cough.        O2 2lpm via   Cardiovascular: Negative for leg swelling.  Gastrointestinal: Negative for abdominal pain and constipation.  Genitourinary: Negative for dysuria, hematuria and urgency.       Incontinent of urine.   Musculoskeletal: Positive for arthralgias.       No longer ambulatory.   Skin: Positive for rash.  Neurological: Positive for speech difficulty and weakness. Negative for seizures and light-headedness.  Dementia. Expressive aphasia. Right side weakness with muscle strength 5/5  Psychiatric/Behavioral: Positive for confusion. Negative for hallucinations and sleep disturbance. The patient is not nervous/anxious.        Smiled, more alert.     Immunization History  Administered Date(s) Administered   Influenza Whole 10/19/2017   Influenza, High Dose Seasonal PF 10/08/2018   Influenza-Unspecified 11/20/2013, 09/26/2014, 10/24/2015, 11/03/2016   Moderna SARS-COVID-2 Vaccination 01/08/2019   Tdap 09/19/2016   Zoster 01/07/2012   Pertinent  Health Maintenance Due  Topic Date Due   INFLUENZA VACCINE  08/07/2019   DEXA SCAN  Discontinued   Fall Risk  09/15/2017 09/05/2016  Falls in the past year? No No   Functional Status Survey:    Vitals:   09/06/19 1419  BP: 102/72  Pulse: 73  Resp: 18  Temp: 97.9 F (36.6 C)  TempSrc: Oral  SpO2: 95%  Weight: 137 lb 6.4 oz (62.3 kg)  Height: 5\' 3"  (1.6 m)   Body mass index is 24.34 kg/m. Physical Exam Vitals and nursing note reviewed.  HENT:     Head: Normocephalic and atraumatic.     Mouth/Throat:     Mouth: Mucous membranes are dry.  Eyes:     Extraocular Movements: Extraocular movements intact.      Conjunctiva/sclera: Conjunctivae normal.     Pupils: Pupils are equal, round, and reactive to light.  Cardiovascular:     Rate and Rhythm: Normal rate and regular rhythm.     Heart sounds: No murmur heard.   Pulmonary:     Breath sounds: No rales.     Comments: O2 via [place] Abdominal:     General: Bowel sounds are normal.     Palpations: Abdomen is soft.     Tenderness: There is no abdominal tenderness.  Musculoskeletal:     Cervical back: Normal range of motion and neck supple.     Right lower leg: No edema.     Left lower leg: No edema.     Comments: Trace edema BLE  Skin:    General: Skin is warm and dry.     Findings: Rash present.     Comments: Red maculopapular scattered satellite patterned rash middle back, improved, bed ridden/lying on her back is contributory, the patient only can be assisted to lie on her side with support for a short period of time.   Neurological:     Mental Status: She is alert.     Comments: Not oriented to person, place, and times. Followed simple direction to open eyes, answered to yes or no question, smiled.   Psychiatric:     Comments: Confused, but answered to yes or no questions, smiled, opened eyes when spoke to      Labs reviewed: Recent Labs    06/21/19 0441 06/21/19 0441 06/22/19 0505 06/23/19 0435 06/28/19 0000  NA 144   < > 139 135 146  K 3.6   < > 2.8* 2.8* 3.6  CL 114*  --  112* 103  --   CO2 20*   < > 19* 23 26*  GLUCOSE 93  --  104* 104*  --   BUN 11   < > 7* 6* 11  CREATININE 1.01*   < > 0.82 0.77 0.8  CALCIUM 7.2*   < > 7.4* 7.5* 8.2*  MG 1.8  --  1.8 2.0  --    < > = values in this interval not displayed.   Recent Labs    06/21/19 0441  06/22/19 0505 06/23/19 0435  AST 123* 62* 38  ALT 132* 98* 74*  ALKPHOS 102 116 144*  BILITOT 2.9* 1.8* 1.5*  PROT 5.5* 5.5* 6.1*  ALBUMIN 2.9* 2.8* 3.0*   Recent Labs    06/19/19 2200 06/19/19 2200 06/20/19 0555 06/20/19 0555 06/20/19 0658 06/20/19 0658 06/21/19 0441  06/22/19 0505 06/23/19 0435  WBC 7.7   < > SPECIMEN CLOTTED   < > 14.4*   < > 9.3 8.4 7.0  NEUTROABS 7.0  --  SPECIMEN CLOTTED  --  12.9*  --   --   --   --   HGB 15.2*   < > SPECIMEN CLOTTED   < > 12.9   < > 11.6* 11.4* 12.3  HCT 48.4*   < > SPECIMEN CLOTTED   < > 42.3   < > 37.0 35.9* 38.6  MCV 96.2   < > SPECIMEN CLOTTED   < > 100.7*   < > 95.4 93.7 93.2  PLT 222   < > SPECIMEN CLOTTED   < > 210   < > 167 163 158   < > = values in this interval not displayed.   Lab Results  Component Value Date   TSH 2.36 06/09/2019   Lab Results  Component Value Date   HGBA1C 5.5 12/02/2016   Lab Results  Component Value Date   CHOL 174 10/21/2016   HDL 45 10/21/2016   LDLCALC 103 10/21/2016   TRIG 158 10/21/2016    Significant Diagnostic Results in last 30 days:  No results found.  Assessment/Plan  Chronic embolism and thrombosis of unspecified vein Chronic protein S, protein C deficiency, continue Xarelto  Essential hypertension Blood pressure is controlled, continue Metoprolol.   GERD without esophagitis Stable, continue Omeprazole.   Seizure disorder ([place]) Stable, continue Keppra  Hypokalemia 08/18/19 K 3.2, continue Kcl daily.    Family/ staff Communication: plan of care reviewed with the patient and charge nurse.   Labs/tests ordered:  none  Time spend 15 minutes.

## 2019-09-06 NOTE — Assessment & Plan Note (Signed)
Blood pressure is controlled, continue Metoprolol. 

## 2019-09-06 NOTE — Assessment & Plan Note (Signed)
Stable, continue Keppra 

## 2019-09-06 NOTE — Assessment & Plan Note (Signed)
Chronic protein S, protein C deficiency, continue Xarelto

## 2019-09-06 NOTE — Assessment & Plan Note (Signed)
08/18/19 K 3.2, continue Kcl daily.

## 2019-09-07 ENCOUNTER — Encounter: Payer: Self-pay | Admitting: Nurse Practitioner

## 2019-09-20 DIAGNOSIS — L853 Xerosis cutis: Secondary | ICD-10-CM | POA: Diagnosis not present

## 2019-09-20 DIAGNOSIS — L570 Actinic keratosis: Secondary | ICD-10-CM | POA: Diagnosis not present

## 2019-09-20 DIAGNOSIS — D1800 Hemangioma unspecified site: Secondary | ICD-10-CM | POA: Diagnosis not present

## 2019-09-20 DIAGNOSIS — E876 Hypokalemia: Secondary | ICD-10-CM | POA: Diagnosis not present

## 2019-09-20 DIAGNOSIS — L309 Dermatitis, unspecified: Secondary | ICD-10-CM | POA: Diagnosis not present

## 2019-09-20 LAB — BASIC METABOLIC PANEL
BUN: 5 (ref 4–21)
CO2: 19 (ref 13–22)
Chloride: 110 — AB (ref 99–108)
Creatinine: 0.8 (ref 0.5–1.1)
Glucose: 70
Potassium: 3.7 (ref 3.4–5.3)
Sodium: 141 (ref 137–147)

## 2019-09-20 LAB — COMPREHENSIVE METABOLIC PANEL: Calcium: 8.3 — AB (ref 8.7–10.7)

## 2019-09-22 ENCOUNTER — Non-Acute Institutional Stay (SKILLED_NURSING_FACILITY): Payer: Medicare Other | Admitting: Internal Medicine

## 2019-09-22 ENCOUNTER — Encounter: Payer: Self-pay | Admitting: Internal Medicine

## 2019-09-22 DIAGNOSIS — F0151 Vascular dementia with behavioral disturbance: Secondary | ICD-10-CM | POA: Diagnosis not present

## 2019-09-22 DIAGNOSIS — R627 Adult failure to thrive: Secondary | ICD-10-CM | POA: Diagnosis not present

## 2019-09-22 DIAGNOSIS — F01518 Vascular dementia, unspecified severity, with other behavioral disturbance: Secondary | ICD-10-CM

## 2019-09-22 DIAGNOSIS — G40909 Epilepsy, unspecified, not intractable, without status epilepticus: Secondary | ICD-10-CM

## 2019-09-22 DIAGNOSIS — E876 Hypokalemia: Secondary | ICD-10-CM | POA: Diagnosis not present

## 2019-09-22 DIAGNOSIS — I8291 Chronic embolism and thrombosis of unspecified vein: Secondary | ICD-10-CM | POA: Diagnosis not present

## 2019-09-22 NOTE — Progress Notes (Signed)
Location:  [facility] Room Number: 109 Place of Service:  SNF (31)  Provider:   Code Status: DNR  Goals of Care:  Advanced Directives 09/22/2019  Does Patient Have a Medical Advance Directive? Yes  Type of Advance Directive Out of facility DNR (pink MOST or yellow form);Living will;Healthcare Power of Attorney  Does patient want to make changes to medical advance directive? No - Patient declined  Copy of [place] in Chart? Yes - validated most recent copy scanned in chart (See row information)  Pre-existing out of facility DNR order (yellow form or pink MOST form) Yellow form placed in chart (order not valid for inpatient use)     Chief Complaint  Patient presents with  . Medical Management of Chronic Issues  . Health Maintenance    COVID 19 2nd dose    HPI: Patient is a 86 y.o. female seen today for medical management of chronic diseases.    Patient has a history of CVA,vascular dementia with behavior issues, hypertension history of DVT on chronic Xarelto.H/o Seizures Was admitted in the hospital from 6/13-6/18 with E. coli sepsis, acute cholecystitis and UTI.  And pneumonia Has been Hospice Care since then  She has lost almost 20lbs. Continue to stay in her Room. Very Weak and Anxious.  Refuses her Meds sometimes Also eating only 25 % Unable to get much history due the her dementia  Past Medical History:  Diagnosis Date  . Acute encephalopathy   . Aphasia following unspecified cerebrovascular disease   . 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 Depression   . Dysphagia following cerebrovascular disease   . Hepatic steatosis   . Hydroureteronephrosis   . Hypertension   . Insomnia   . Macular degeneration   . Major depressive disorder, recurrent ([place])   . Neuralgia and neuritis, unspecified (CODE)   . Nontraumatic intracranial hemorrhage ([place])   . Stroke ([place])   . Unspecified type of carcinoma in situ  of unspecified breast   . 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 ABDOMINAL HYSTERECTOMY    . APPENDECTOMY    . BREAST SURGERY    . MASTECTOMY Right     Allergies  Allergen Reactions  . Hydrocodone Nausea And Vomiting  . Morphine And Related Nausea And Vomiting  . Actonel [Risedronate Sodium] Other (See Comments)    unknown  . Darvon [Propoxyphene Hcl] Other (See Comments)    unknown  . Oxycodone Hcl Other (See Comments)    unknown  . Pneumovax [Pneumococcal Polysaccharide Vaccine] Other (See Comments)    unknown    Outpatient Encounter Medications as of 09/22/2019  Medication Sig  . acetaminophen (TYLENOL) 325 MG tablet Take 650 mg by mouth every 4 (four) hours as needed for mild pain.   Marland Kitchen acetaminophen (TYLENOL) 500 MG tablet Take 500 mg by mouth 4 (four) times daily. 1 tablet, oral, Four Times A Day, NOT TO EXCEED 3000MG /24 HOURS  . bisacodyl (DULCOLAX) 10 MG suppository Place 10 mg rectally as needed for moderate constipation.  . hydrocortisone 2.5 % lotion Apply 1 application topically 2 (two) times daily as needed. To Face  . levETIRAcetam (KEPPRA) 100 MG/ML solution Take 250 mg by mouth 2 (two) times daily.  Marland Kitchen LORazepam (ATIVAN) 0.5 MG tablet Take 0.5 mg by mouth every 6 (six) hours as needed for anxiety.  . metoprolol  tartrate (LOPRESSOR) 25 MG tablet Take 12.5 mg by mouth 2 (two) times daily.  Marland Kitchen nystatin (MYCOSTATIN/NYSTOP) powder 1 application. Apply 1 application topically daily as needed under left breast. As Needed  . nystatin cream (MYCOSTATIN) 1 application. Apply 1 application prn TID. Mix with trimcinolone cream and apply to reddened area on back and buttocks with each incontinent episode and prn, then cover with zinc oxide barrier cream. As Needed  . nystatin cream (MYCOSTATIN) Apply 1 application topically 3 (three) times daily. Special Instructions: Apply to back and  buttocks  . omeprazole (PRILOSEC) 10 MG capsule Take 10 mg by mouth daily.  . Potassium Chloride ER 20 MEQ TBCR Take 40 mg by mouth daily. Give 2 tabs to equal 40 mEq Once A Day   . Rivaroxaban (XARELTO) 15 MG TABS tablet Take 15 mg by mouth daily.  Marland Kitchen triamcinolone cream (KENALOG) 0.5 % Apply 1 application topically 3 (three) times daily. Mix with Nystatin cream and apply to reddened areas on back and buttocks with each incontinent episode and prn, then cover with zinc oxide barrier cream.  . triamcinolone cream (KENALOG) 0.5 % Apply 1 application topically 3 (three) times daily.   No facility-administered encounter medications on file as of 09/22/2019.    Review of Systems:  Review of Systems  Unable to perform ROS: Dementia    Health Maintenance  Topic Date Due  . COVID-19 Vaccine (2 - Moderna 2-dose series) 02/05/2019  . INFLUENZA VACCINE  08/07/2019  . TETANUS/TDAP  09/20/2026  . DEXA SCAN  Discontinued    Physical Exam: Vitals:   09/22/19 1333  BP: 140/80  Pulse: 76  Resp: 16  Temp: (!) 97.1 F (36.2 C)  SpO2: 94%  Weight: 129 lb 12.8 oz (58.9 kg)  Height: 5\' 3"  (1.6 m)   Body mass index is 22.99 kg/m. Physical Exam Vitals reviewed.  Constitutional:      Comments: Very Frail  HENT:     Head: Normocephalic.     Nose: Nose normal.     Mouth/Throat:     Mouth: Mucous membranes are moist.     Pharynx: Oropharynx is clear.  Eyes:     Pupils: Pupils are equal, round, and reactive to light.  Cardiovascular:     Rate and Rhythm: Normal rate and regular rhythm.     Pulses: Normal pulses.  Pulmonary:     Effort: Pulmonary effort is normal.     Breath sounds: Normal breath sounds.  Abdominal:     General: Abdomen is flat. Bowel sounds are normal.     Palpations: Abdomen is soft.     Comments: Mild Tender in Lower Abdomen  Musculoskeletal:        General: Swelling present.     Cervical back: Neck supple.  Skin:    General: Skin is warm.  Neurological:      General: No focal deficit present.     Mental Status: She is alert.     Comments: Does respond to her Name but not Follow any commands  Psychiatric:        Mood and Affect: Mood normal.        Thought Content: Thought content normal.     Comments: Get Very Anxious     Labs reviewed: Basic Metabolic Panel: Recent Labs    06/09/19 0000 06/19/19 2200 06/21/19 0441 06/21/19 0441 06/22/19 0505 06/22/19 0505 06/23/19 0435 06/23/19 0435 06/28/19 0000 08/18/19 0000 09/20/19 0000  NA 142   < > 144   < >  139   < > 135  --  146 145 141  K 3.7   < > 3.6   < > 2.8*   < > 2.8*   < > 3.6 3.2* 3.7  CL 109*   < > 114*   < > 112*   < > 103  --   --  114* 110*  CO2 22   < > 20*   < > 19*   < > 23   < > 26* 19 19  GLUCOSE  --    < > 93  --  104*  --  104*  --   --   --   --   BUN 12   < > 11   < > 7*   < > 6*  --  11 11 5   CREATININE 1.0   < > 1.01*   < > 0.82   < > 0.77  --  0.8 0.8 0.8  CALCIUM 8.8   < > 7.2*   < > 7.4*   < > 7.5*   < > 8.2* 8.9 8.3*  MG  --   --  1.8  --  1.8  --  2.0  --   --   --   --   TSH 2.36  --   --   --   --   --   --   --   --   --   --    < > = values in this interval not displayed.   Liver Function Tests: Recent Labs    06/21/19 0441 06/22/19 0505 06/23/19 0435  AST 123* 62* 38  ALT 132* 98* 74*  ALKPHOS 102 116 144*  BILITOT 2.9* 1.8* 1.5*  PROT 5.5* 5.5* 6.1*  ALBUMIN 2.9* 2.8* 3.0*   No results for input(s): LIPASE, AMYLASE in the last 8760 hours. Recent Labs    06/20/19 1423  AMMONIA 32   CBC: Recent Labs    06/19/19 2200 06/19/19 2200 06/20/19 0555 06/20/19 0555 06/20/19 0658 06/20/19 0658 06/21/19 0441 06/22/19 0505 06/23/19 0435  WBC 7.7   < > SPECIMEN CLOTTED   < > 14.4*   < > 9.3 8.4 7.0  NEUTROABS 7.0  --  SPECIMEN CLOTTED  --  12.9*  --   --   --   --   HGB 15.2*   < > SPECIMEN CLOTTED   < > 12.9   < > 11.6* 11.4* 12.3  HCT 48.4*   < > SPECIMEN CLOTTED   < > 42.3   < > 37.0 35.9* 38.6  MCV 96.2   < > SPECIMEN CLOTTED   < >  100.7*   < > 95.4 93.7 93.2  PLT 222   < > SPECIMEN CLOTTED   < > 210   < > 167 163 158   < > = values in this interval not displayed.   Lipid Panel: No results for input(s): CHOL, HDL, LDLCALC, TRIG, CHOLHDL, LDLDIRECT in the last 8760 hours. Lab Results  Component Value Date   HGBA1C 5.5 12/02/2016    Procedures since last visit: No results found.  Assessment/Plan Chronic embolism and thrombosis of unspecified vein Continue on Xarelto for now  Seizure disorder (HCC) On Keppra Hypokalemia Will continue Potassium  Her Potassium today was 3.7 Hypertension On low Dose of Lopressor Vascular dementia with behavior disturbance (HCC) Supportive care D/W hospice and start her on Ativan QD Adult failure to thrive Supportive care  Labs/tests ordered:  * No order type specified * Next appt:  Visit date not found

## 2019-09-30 ENCOUNTER — Other Ambulatory Visit: Payer: Self-pay | Admitting: *Deleted

## 2019-09-30 MED ORDER — LORAZEPAM 0.5 MG PO TABS: 0.5000 mg | ORAL_TABLET | Freq: Four times a day (QID) | ORAL | 0 refills | Status: DC | PRN

## 2019-09-30 NOTE — Telephone Encounter (Signed)
Received fax from [facility] Pended Rx and sent to Dr. Gupta for approval.  

## 2019-10-10 ENCOUNTER — Encounter: Payer: Self-pay | Admitting: Nurse Practitioner

## 2019-10-10 ENCOUNTER — Non-Acute Institutional Stay (SKILLED_NURSING_FACILITY): Payer: Medicare PPO | Admitting: Nurse Practitioner

## 2019-10-10 DIAGNOSIS — W19XXXA Unspecified fall, initial encounter: Secondary | ICD-10-CM | POA: Diagnosis not present

## 2019-10-10 DIAGNOSIS — E876 Hypokalemia: Secondary | ICD-10-CM | POA: Diagnosis not present

## 2019-10-10 DIAGNOSIS — I10 Essential (primary) hypertension: Secondary | ICD-10-CM | POA: Diagnosis not present

## 2019-10-10 DIAGNOSIS — L299 Pruritus, unspecified: Secondary | ICD-10-CM

## 2019-10-10 DIAGNOSIS — D6859 Other primary thrombophilia: Secondary | ICD-10-CM | POA: Diagnosis not present

## 2019-10-10 DIAGNOSIS — K219 Gastro-esophageal reflux disease without esophagitis: Secondary | ICD-10-CM

## 2019-10-10 DIAGNOSIS — F01518 Vascular dementia, unspecified severity, with other behavioral disturbance: Secondary | ICD-10-CM

## 2019-10-10 DIAGNOSIS — R627 Adult failure to thrive: Secondary | ICD-10-CM

## 2019-10-10 DIAGNOSIS — R269 Unspecified abnormalities of gait and mobility: Secondary | ICD-10-CM | POA: Diagnosis not present

## 2019-10-10 DIAGNOSIS — G40909 Epilepsy, unspecified, not intractable, without status epilepticus: Secondary | ICD-10-CM | POA: Diagnosis not present

## 2019-10-10 DIAGNOSIS — S22070A Wedge compression fracture of T9-T10 vertebra, initial encounter for closed fracture: Secondary | ICD-10-CM

## 2019-10-10 DIAGNOSIS — F0151 Vascular dementia with behavioral disturbance: Secondary | ICD-10-CM | POA: Diagnosis not present

## 2019-10-10 NOTE — Assessment & Plan Note (Signed)
Supplemented, continue Kcl

## 2019-10-10 NOTE — Assessment & Plan Note (Signed)
Blood pressure is controlled, continue Metoprolol. 

## 2019-10-10 NOTE — Assessment & Plan Note (Signed)
Stable, continue Keppra 

## 2019-10-10 NOTE — Assessment & Plan Note (Signed)
Chronic back pain, dc Tylenol-not effective, will try Morphine 5mg  qd and q6hr prn, monitor for possible s/s of N/V per record.

## 2019-10-10 NOTE — Assessment & Plan Note (Signed)
Later entry: Hospice/HPOA desire to dc all meds except prn Morphine 5mg  qd and q6h prn, Lorazepam qd and q6hr prn, Hydroxyzine prn, suppository prn.

## 2019-10-10 NOTE — Assessment & Plan Note (Signed)
fall reported 10/08/19 when the patient was found on floor, no apparent injury, the patient is no longer walker or self transfer, she is unable to call for assistance related to her advanced dementia. The patient resides in [facility] unit for safety, care assistance, her goal of care is comfort measures, under Hospice service.

## 2019-10-10 NOTE — Progress Notes (Signed)
Location:    [facility] Room Number: [place]:  SNF (31) Provider: Marlana Latus NP  Bertine Schlottman X, NP  Patient Care Team: Jhamal Plucinski X, NP as PCP - General (Internal Medicine) Akul Leggette X, NP as Nurse Practitioner (Internal Medicine) Idolina Primer, Warnell Bureau as Consulting Physician (Optometry) Monna Fam, MD as Consulting Physician (Ophthalmology) Lucky Cowboy, Gordy Clement, NP as Nurse Practitioner (Hospice and Palliative Medicine) Virgie Dad, MD as Consulting Physician (Internal Medicine)  Extended Emergency Contact Information Primary Emergency Contact: [facility] Address: [address], [place] [address] [name] of [place] Phone: [phone] Work Phone: [phone] Mobile Phone: [phone] Relation: Daughter Secondary Emergency Contact: Jenne Campus Home Phone: [phone] Relation: Daughter  Code Status: DNR Goals of care: Advanced Directive information Advanced Directives 09/22/2019  Does Patient Have a Medical Advance Directive? Yes  Type of Advance Directive Out of facility DNR (pink MOST or yellow form);Living will;Healthcare Power of Attorney  Does patient want to make changes to medical advance directive? No - Patient declined  Copy of [place] in Chart? Yes - validated most recent copy scanned in chart (See row information)  Pre-existing out of facility DNR order (yellow form or pink MOST form) Yellow form placed in chart (order not valid for inpatient use)     Chief Complaint  Patient presents with   Acute Visit    Fall    HPI:  Pt is a 86 y.o. female seen today for an acute visit for fall reported 10/08/19 when the patient was found on floor, no apparent injury, the patient is no longer walker or self transfer, she is unable to call for assistance related to her advanced dementia. The patient resides in [place] unit for safety, care assistance, her goal of care  is comfort measures, under Hospice service.   AFT. Comfort measure is her goal of care, prn Lorazepam, Tylenol HTN, takes Metoprolol GERD takes Omeprazole VTE/protein S/C deficiency, takes Rivaroxaban Hypokalemia, takes Kcl  Seizures, takes Keppra     Past Medical History:  Diagnosis Date   Acute encephalopathy    Aphasia following unspecified cerebrovascular disease    Chronic embolism and thrombosis of unspecified vein    Depression    Dysphagia following cerebrovascular disease    Hepatic steatosis    Hydroureteronephrosis    Hypertension    Insomnia    Macular degeneration    Major depressive disorder, recurrent (HCC)    Neuralgia and neuritis, unspecified (CODE)    Nontraumatic intracranial hemorrhage (HCC)    Stroke ([place])    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    right   Past Surgical History:  Procedure Laterality Date   ABDOMINAL HYSTERECTOMY     APPENDECTOMY     BREAST SURGERY     MASTECTOMY Right     Allergies  Allergen Reactions   Hydrocodone Nausea And Vomiting   Morphine And Related Nausea And Vomiting   Actonel [Risedronate Sodium] Other (See Comments)    unknown   Darvon [Propoxyphene Hcl] Other (See Comments)    unknown   Oxycodone Hcl Other (See Comments)    unknown   Pneumovax [Pneumococcal Polysaccharide Vaccine] Other (See Comments)    unknown    Allergies as of 10/10/2019      Reactions   Hydrocodone Nausea And Vomiting  Morphine And Related Nausea And Vomiting   Actonel [risedronate Sodium] Other (See Comments)   unknown   Darvon [propoxyphene Hcl] Other (See Comments)   unknown   Oxycodone Hcl Other (See Comments)   unknown   Pneumovax [pneumococcal Polysaccharide Vaccine] Other (See Comments)   unknown      Medication List       Accurate as of October 10, 2019 11:59 PM. If you have any questions, ask your nurse or doctor.        acetaminophen 325 MG tablet Commonly known as: TYLENOL Take 650 mg by mouth every 4 (four) hours as needed for mild pain.   acetaminophen 500 MG tablet Commonly known as: TYLENOL Take 500 mg by mouth 4 (four) times daily. 1 tablet, oral, Four Times A Day, NOT TO EXCEED 3000MG /24 HOURS   bisacodyl 10 MG suppository Commonly known as: DULCOLAX Place 10 mg rectally as needed for moderate constipation.   hydrocortisone 2.5 % lotion Apply 1 application topically 2 (two) times daily as needed. To Face   levETIRAcetam 100 MG/ML solution Commonly known as: KEPPRA Take 250 mg by mouth 2 (two) times daily.   LORazepam 0.5 MG tablet Commonly known as: ATIVAN Take 0.5 mg by mouth daily.   LORazepam 0.5 MG tablet Commonly known as: Ativan Take 1 tablet (0.5 mg total) by mouth every 6 (six) hours as needed for anxiety.   metoprolol tartrate 25 MG tablet Commonly known as: LOPRESSOR Take 12.5 mg by mouth 2 (two) times daily.   nystatin cream Commonly known as: MYCOSTATIN 1 application. Apply 1 application prn TID. Mix with trimcinolone cream and apply to reddened area on back and buttocks with each incontinent episode and prn, then cover with zinc oxide barrier cream. As Needed What changed: Another medication with the same name was removed. Continue taking this medication, and follow the directions you see here. Changed by: Keyetta Hollingworth X Cinthya Bors, NP   nystatin powder Commonly known as: MYCOSTATIN/NYSTOP 1 application. Apply 1 application topically daily as needed under left breast. As Needed What changed: Another medication with the same name was removed. Continue taking this medication, and follow the directions you see here. Changed by: Cameshia Cressman X Khoury Siemon, NP   omeprazole 10 MG capsule Commonly known as: PRILOSEC Take 10 mg by mouth daily.   Potassium Chloride ER 20 MEQ Tbcr Take 40 mg by mouth daily. Give 2 tabs to  equal 40 mEq Once A Day   Rivaroxaban 15 MG Tabs tablet Commonly known as: XARELTO Take 15 mg by mouth daily.   triamcinolone cream 0.5 % Commonly known as: KENALOG Apply 1 application topically 3 (three) times daily. Mix with Nystatin cream and apply to reddened areas on back and buttocks with each incontinent episode and prn, then cover with zinc oxide barrier cream. What changed: Another medication with the same name was removed. Continue taking this medication, and follow the directions you see here. Changed by: Onesimo Lingard X Shericka [place], NP       Review of Systems  Constitutional: Positive for appetite change, fatigue and unexpected weight change. Negative for fever.       Lost #5Ibs in 2-3 weeks.   HENT: Positive for hearing loss and trouble swallowing. Negative for congestion.   Eyes: Negative for visual disturbance.  Respiratory: Positive for shortness of breath. Negative for cough.        O2 2lpm via [place]  Cardiovascular: Negative for leg swelling.  Gastrointestinal: Negative for abdominal pain and constipation.  Genitourinary: Negative  for dysuria, hematuria and urgency.       Incontinent of urine.   Musculoskeletal: Positive for arthralgias and back pain.       No longer ambulatory.   Skin: Positive for rash.       Itching back  Neurological: Positive for speech difficulty and weakness. Negative for dizziness, seizures and headaches.       Dementia. Expressive aphasia. Right side weakness with muscle strength 5/5  Psychiatric/Behavioral: Positive for agitation and confusion. Negative for hallucinations and sleep disturbance. The patient is nervous/anxious.        Smiled, more alert.     Immunization History  Administered Date(s) Administered   Influenza Whole 10/19/2017   Influenza, High Dose Seasonal PF 10/08/2018   Influenza-Unspecified 11/20/2013, 09/26/2014, 10/24/2015, 11/03/2016   Moderna SARS-COVID-2 Vaccination 01/08/2019   Tdap 09/19/2016   Zoster 01/07/2012    Pertinent  Health Maintenance Due  Topic Date Due   INFLUENZA VACCINE  08/07/2019   DEXA SCAN  Discontinued   Fall Risk  09/15/2017 09/05/2016  Falls in the past year? No No   Functional Status Survey:    Vitals:   10/10/19 1017  BP: 116/78  Pulse: 98  Resp: 18  Temp: (!) 96.7 F (35.9 C)  SpO2: 96%  Weight: 124 lb 3.2 oz (56.3 kg)  Height: 5\' 3"  (1.6 m)   Body mass index is 22 kg/m. Physical Exam Vitals and nursing note reviewed.  HENT:     Head: Normocephalic and atraumatic.     Mouth/Throat:     Mouth: Mucous membranes are dry.  Eyes:     Extraocular Movements: Extraocular movements intact.     Conjunctiva/sclera: Conjunctivae normal.     Pupils: Pupils are equal, round, and reactive to light.  Cardiovascular:     Rate and Rhythm: Normal rate and regular rhythm.     Heart sounds: No murmur heard.   Pulmonary:     Breath sounds: No rales.     Comments: O2 via [place] Abdominal:     General: Bowel sounds are normal.     Palpations: Abdomen is soft.     Tenderness: There is no abdominal tenderness.  Musculoskeletal:     Cervical back: Normal range of motion and neck supple.     Right lower leg: No edema.     Left lower leg: No edema.     Comments: Trace edema BLE  Skin:    General: Skin is warm and dry.     Findings: Rash present.     Comments: Red maculopapular scattered satellite patterned rash middle back, improved, bed ridden/lying on her back is contributory  Neurological:     Mental Status: She is alert.     Comments: Not oriented to person, place, and times. Followed simple direction to open eyes, answered to yes or no question, smiled.   Psychiatric:     Comments: Confused, but answered to yes or no questions, smiled, opened eyes when spoke to      Labs reviewed: Recent Labs    06/21/19 0441 06/21/19 0441 06/22/19 0505 06/22/19 0505 06/23/19 0435 06/23/19 0435 06/28/19 0000 08/18/19 0000 09/20/19 0000  NA 144   < > 139   < > 135  --   146 145 141  K 3.6   < > 2.8*   < > 2.8*   < > 3.6 3.2* 3.7  CL 114*   < > 112*   < > 103  --   --  114*  110*  CO2 20*   < > 19*   < > 23   < > 26* 19 19  GLUCOSE 93  --  104*  --  104*  --   --   --   --   BUN 11   < > 7*   < > 6*  --  11 11 5   CREATININE 1.01*   < > 0.82   < > 0.77  --  0.8 0.8 0.8  CALCIUM 7.2*   < > 7.4*   < > 7.5*   < > 8.2* 8.9 8.3*  MG 1.8  --  1.8  --  2.0  --   --   --   --    < > = values in this interval not displayed.   Recent Labs    06/21/19 0441 06/22/19 0505 06/23/19 0435  AST 123* 62* 38  ALT 132* 98* 74*  ALKPHOS 102 116 144*  BILITOT 2.9* 1.8* 1.5*  PROT 5.5* 5.5* 6.1*  ALBUMIN 2.9* 2.8* 3.0*   Recent Labs    06/19/19 2200 06/19/19 2200 06/20/19 0555 06/20/19 0555 06/20/19 0658 06/20/19 0658 06/21/19 0441 06/22/19 0505 06/23/19 0435  WBC 7.7   < > SPECIMEN CLOTTED   < > 14.4*   < > 9.3 8.4 7.0  NEUTROABS 7.0  --  SPECIMEN CLOTTED  --  12.9*  --   --   --   --   HGB 15.2*   < > SPECIMEN CLOTTED   < > 12.9   < > 11.6* 11.4* 12.3  HCT 48.4*   < > SPECIMEN CLOTTED   < > 42.3   < > 37.0 35.9* 38.6  MCV 96.2   < > SPECIMEN CLOTTED   < > 100.7*   < > 95.4 93.7 93.2  PLT 222   < > SPECIMEN CLOTTED   < > 210   < > 167 163 158   < > = values in this interval not displayed.   Lab Results  Component Value Date   TSH 2.36 06/09/2019   Lab Results  Component Value Date   HGBA1C 5.5 12/02/2016   Lab Results  Component Value Date   CHOL 174 10/21/2016   HDL 45 10/21/2016   LDLCALC 103 10/21/2016   TRIG 158 10/21/2016    Significant Diagnostic Results in last 30 days:  No results found.  Assessment/Plan: Gait abnormality Reported 10/08/19 the patient was found on the floor, no apparent injury, agitated, Ativan x1, itching back, Atarax was stopped 10/05/19. Will have Hydroxyzine 25mg  q6hr prn available for itching, anxiety. Observe.   Fall fall reported 10/08/19 when the patient was found on floor, no apparent injury, the patient is no  longer walker or self transfer, she is unable to call for assistance related to her advanced dementia. The patient resides in [place] unit for safety, care assistance, her goal of care is comfort measures, under Hospice service.    Vascular dementia with behavior disturbance (HCC) AFT. Comfort measure is her goal of care, prn Lorazepam, Tylenol  Essential hypertension Blood pressure is controlled, continue Metoprolol.   GERD without esophagitis Stable, continue Omeprazole.   Protein C deficiency ([place]) VTE/protein S/C deficiency, takes Rivaroxaban   Hypokalemia Supplemented, continue Kcl  Seizure disorder (HCC) Stable, continue Keppra  Pruritus Chronic back itching, even the rash has improved, will have prn Hydroxyzine prn available for symptomatic management.   Adult failure to thrive  Later entry: Hospice/HPOA desire to dc  all meds except prn Morphine 5mg  qd and q6h prn, Lorazepam qd and q6hr prn, Hydroxyzine prn, suppository prn.   Compression fracture of T9 vertebra (HCC) Chronic back pain, dc Tylenol-not effective, will try Morphine 5mg  qd and q6hr prn, monitor for possible s/s of N/V per record.     Family/ staff Communication: plan of care reviewed with the patient and charge nurse.   Labs/tests ordered:  None  Time spend 25 minutes.

## 2019-10-10 NOTE — Assessment & Plan Note (Signed)
Reported 10/08/19 the patient was found on the floor, no apparent injury, agitated, Ativan x1, itching back, Atarax was stopped 10/05/19. Will have Hydroxyzine 25mg  q6hr prn available for itching, anxiety. Observe.

## 2019-10-10 NOTE — Assessment & Plan Note (Signed)
AFT. Comfort measure is her goal of care, prn Lorazepam, Tylenol

## 2019-10-10 NOTE — Assessment & Plan Note (Signed)
Chronic back itching, even the rash has improved, will have prn Hydroxyzine prn available for symptomatic management.

## 2019-10-10 NOTE — Assessment & Plan Note (Signed)
VTE/protein S/C deficiency, takes Rivaroxaban

## 2019-10-10 NOTE — Assessment & Plan Note (Signed)
Stable, continue Omeprazole.  

## 2019-10-11 ENCOUNTER — Encounter: Payer: Self-pay | Admitting: Nurse Practitioner

## 2019-11-02 ENCOUNTER — Non-Acute Institutional Stay (SKILLED_NURSING_FACILITY): Payer: Medicare PPO | Admitting: Nurse Practitioner

## 2019-11-02 ENCOUNTER — Encounter: Payer: Self-pay | Admitting: Nurse Practitioner

## 2019-11-02 DIAGNOSIS — L299 Pruritus, unspecified: Secondary | ICD-10-CM

## 2019-11-02 DIAGNOSIS — I10 Essential (primary) hypertension: Secondary | ICD-10-CM

## 2019-11-02 DIAGNOSIS — G40909 Epilepsy, unspecified, not intractable, without status epilepticus: Secondary | ICD-10-CM | POA: Diagnosis not present

## 2019-11-02 DIAGNOSIS — F0151 Vascular dementia with behavioral disturbance: Secondary | ICD-10-CM

## 2019-11-02 DIAGNOSIS — K5901 Slow transit constipation: Secondary | ICD-10-CM | POA: Diagnosis not present

## 2019-11-02 DIAGNOSIS — F01518 Vascular dementia, unspecified severity, with other behavioral disturbance: Secondary | ICD-10-CM

## 2019-11-02 DIAGNOSIS — R627 Adult failure to thrive: Secondary | ICD-10-CM

## 2019-11-02 DIAGNOSIS — K219 Gastro-esophageal reflux disease without esophagitis: Secondary | ICD-10-CM

## 2019-11-02 DIAGNOSIS — I8291 Chronic embolism and thrombosis of unspecified vein: Secondary | ICD-10-CM

## 2019-11-02 NOTE — Assessment & Plan Note (Signed)
Controlled blood pressure, off meds.  

## 2019-11-02 NOTE — Assessment & Plan Note (Signed)
Mostly in back, continue Triamcinolone topical cream, prn Hydroxyzine.

## 2019-11-02 NOTE — Assessment & Plan Note (Signed)
FTT, comfort measures, Hospice service.

## 2019-11-02 NOTE — Progress Notes (Addendum)
Location:   [facility] Room Number: 109 Place of Service:  SNF (31) Provider:  Keyera Hattabaugh, Lennie Odor NP  Zell Hylton X, NP  Patient Care Team: Osha Errico X, NP as PCP - General (Internal Medicine) Elgar Scoggins X, NP as Nurse Practitioner (Internal Medicine) Dunn, Warnell Bureau as Consulting Physician (Optometry) Monna Fam, MD as Consulting Physician (Ophthalmology) Lucky Cowboy, Gordy Clement, NP as Nurse Practitioner (Hospice and Palliative Medicine) Virgie Dad, MD as Consulting Physician (Internal Medicine)  Extended Emergency Contact Information Primary Emergency Contact: [facility] Address: [address]          [place], [place] [address] [name] of [place] Phone: [phone] Work Phone: [phone] Mobile Phone: [phone] Relation: Daughter Secondary Emergency Contact: Jenne Campus Home Phone: [phone] Relation: Daughter  Code Status:  DNR Goals of care: Advanced Directive information Advanced Directives 11/02/2019  Does Patient Have a Medical Advance Directive? Yes  Type of Advance Directive Out of facility DNR (pink MOST or yellow form);Living will;Healthcare Power of Attorney  Does patient want to make changes to medical advance directive? No - Patient declined  Copy of Lup[place] in Chart? Yes - validated most recent copy scanned in chart (See row information)  Pre-existing out of facility DNR order (yellow form or pink MOST form) Yellow form placed in chart (order not valid for inpatient use)     Chief Complaint  Patient presents with  . Medical Management of Chronic Issues    HPI:  Pt is a 86 y.o. female seen today for medical management of chronic diseases.    FTT.Comfort measure is her goal of care, prn Lorazepam, Tylenol, Morphine.   HTN, off meds.  GERD off meds.  VTE/protein S/C deficiency, off anticoagulation.  Seizures, off Keppra  Itching,  prn Hydroxyzine, Triamcinolone cream.   Constipation, takes Bisacodyl 10mg  prn     Past Medical History:  Diagnosis Date  . Acute encephalopathy   . Aphasia following unspecified cerebrovascular disease   . 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 Depression   . Dysphagia following cerebrovascular disease   . Hepatic steatosis   . Hydroureteronephrosis   . Hypertension   . Insomnia   . Macular degeneration   . Major depressive disorder, recurrent ([place])   . Neuralgia and neuritis, unspecified (CODE)   . Nontraumatic intracranial hemorrhage ([place])   . Stroke ([place])   . 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   . 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 ABDOMINAL HYSTERECTOMY    . APPENDECTOMY    . BREAST SURGERY    . MASTECTOMY Right     Allergies  Allergen Reactions  . Hydrocodone Nausea And Vomiting  . Morphine And Related Nausea And Vomiting  . Actonel [Risedronate Sodium] Other (See Comments)    unknown  . Darvon [Propoxyphene Hcl] Other (See Comments)    unknown  . Oxycodone Hcl Other (See Comments)    unknown  . Pneumovax [Pneumococcal Polysaccharide Vaccine] Other (See Comments)    unknown    Allergies as of 11/02/2019      Reactions   Hydrocodone Nausea And Vomiting   Morphine And Related Nausea And Vomiting   Actonel [risedronate Sodium] Other (See Comments)   unknown   Darvon [propoxyphene Hcl] Other (See Comments)   unknown   Oxycodone Hcl Other (See Comments)   unknown  Pneumovax [pneumococcal Polysaccharide Vaccine] Other (See Comments)   unknown      Medication List       Accurate as of November 02, 2019 11:59 PM. If you have any questions, ask your nurse or doctor.        STOP taking these medications   levETIRAcetam 100 MG/ML solution Commonly known as: KEPPRA Stopped by: Kimiyo Carmicheal X Donnavan Covault, NP   metoprolol tartrate 25 MG  tablet Commonly known as: LOPRESSOR Stopped by: Ernestyne Caldwell X Mccauley Diehl, NP   omeprazole 10 MG capsule Commonly known as: PRILOSEC Stopped by: Tracey Stewart X Debe Anfinson, NP   Potassium Chloride ER 20 MEQ Tbcr Stopped by: Kelis Plasse X Glennis Borger, NP   Rivaroxaban 15 MG Tabs tablet Commonly known as: XARELTO Stopped by: Tilmon Wisehart X Angelisa Winthrop, NP     TAKE these medications   acetaminophen 325 MG tablet Commonly known as: TYLENOL Take 650 mg by mouth every 4 (four) hours as needed for mild pain. What changed: Another medication with the same name was removed. Continue taking this medication, and follow the directions you see here. Changed by: Braelyn Jenson X Giles Currie, NP   bisacodyl 10 MG suppository Commonly known as: DULCOLAX Place 10 mg rectally as needed for moderate constipation.   hydrocortisone 2.5 % lotion Apply 1 application topically 2 (two) times daily as needed. To Face   hydrOXYzine 25 MG tablet Commonly known as: ATARAX/VISTARIL Take 25 mg by mouth daily.   LORazepam 0.5 MG tablet Commonly known as: ATIVAN Take 0.5 mg by mouth daily.   LORazepam 0.5 MG tablet Commonly known as: Ativan Take 1 tablet (0.5 mg total) by mouth every 6 (six) hours as needed for anxiety.   morphine 20 MG/ML concentrated solution Commonly known as: ROXANOL Take by mouth daily. 100 mg/5 mL (20 mg/mL); amt: 0.25 ml   morphine 20 MG/ML concentrated solution Commonly known as: ROXANOL Take by mouth every 6 (six) hours as needed for severe pain. 100 mg/5 mL (20 mg/mL); amt: 0.25 ml   nystatin cream Commonly known as: MYCOSTATIN 1 application. Apply 1 application prn TID. Mix with trimcinolone cream and apply to reddened area on back and buttocks with each incontinent episode and prn, then cover with zinc oxide barrier cream. As Needed   nystatin powder Commonly known as: MYCOSTATIN/NYSTOP 1 application. Apply 1 application topically daily as needed under left breast. As Needed   triamcinolone cream 0.5 % Commonly known as: KENALOG Apply 1  application topically 3 (three) times daily. Mix with Nystatin cream and apply to reddened areas on back and buttocks with each incontinent episode and prn, then cover with zinc oxide barrier cream.       Review of Systems  Constitutional: Positive for fatigue. Negative for fever and unexpected weight change.       Lost #5Ibs in 2-3 weeks.   HENT: Positive for hearing loss and trouble swallowing. Negative for congestion.   Eyes: Negative for visual disturbance.  Respiratory: Positive for shortness of breath. Negative for cough.        O2 2lpm via [place]  Cardiovascular: Negative for leg swelling.  Gastrointestinal: Positive for constipation. Negative for abdominal pain.  Genitourinary: Negative for dysuria, hematuria and urgency.       Incontinent of urine.   Musculoskeletal: Positive for arthralgias and back pain.       No longer ambulatory.   Skin: Positive for rash.       Itching back  Neurological: Positive for speech difficulty and weakness. Negative for seizures, light-headedness and  headaches.       Dementia. Expressive aphasia. Right side weakness with muscle strength 5/5  Psychiatric/Behavioral: Positive for agitation and confusion. Negative for hallucinations and sleep disturbance. The patient is nervous/anxious.        Smiled, more alert.     Immunization History  Administered Date(s) Administered  . Influenza Whole 10/19/2017  . Influenza, High Dose Seasonal PF 10/08/2018  . Influenza-Unspecified 11/20/2013, 09/26/2014, 10/24/2015, 11/03/2016, 10/18/2019  . Moderna SARS-COVID-2 Vaccination 01/08/2019  . Tdap 09/19/2016  . Zoster 01/07/2012   Pertinent  Health Maintenance Due  Topic Date Due  . INFLUENZA VACCINE  Completed  . DEXA SCAN  Discontinued   Fall Risk  09/15/2017 09/05/2016  Falls in the past year? No No   Functional Status Survey:    Vitals:   11/02/19 0844  BP: 110/66  Pulse: 82  Resp: 20  Temp: 98.6 F (37 C)  SpO2: 94%  Weight: 124 lb 3.2 oz  (56.3 kg)  Height: 5\' 3"  (1.6 m)   Body mass index is 22 kg/m. Physical Exam Vitals and nursing note reviewed.  HENT:     Head: Normocephalic and atraumatic.     Mouth/Throat:     Mouth: Mucous membranes are dry.  Eyes:     Extraocular Movements: Extraocular movements intact.     Conjunctiva/sclera: Conjunctivae normal.     Pupils: Pupils are equal, round, and reactive to light.  Cardiovascular:     Rate and Rhythm: Normal rate and regular rhythm.     Heart sounds: No murmur heard.   Pulmonary:     Breath sounds: No rales.     Comments: O2 via [place] Abdominal:     General: Bowel sounds are normal.     Palpations: Abdomen is soft.     Tenderness: There is no abdominal tenderness.  Musculoskeletal:     Cervical back: Normal range of motion and neck supple.     Right lower leg: No edema.     Left lower leg: No edema.     Comments: Trace edema BLE  Skin:    General: Skin is warm and dry.     Findings: Rash present.     Comments: Red maculopapular scattered satellite patterned rash middle back, improved, bed ridden/lying on her back is contributory  Neurological:     Mental Status: She is alert.     Comments: Not oriented to person, place, and times. Followed simple direction to open eyes, answered to yes or no question, smiled.   Psychiatric:     Comments: Confused, but answered to yes or no questions, smiled, opened eyes when spoke to      Labs reviewed: Recent Labs    06/21/19 0441 06/21/19 0441 06/22/19 0505 06/22/19 0505 06/23/19 0435 06/23/19 0435 06/28/19 0000 08/18/19 0000 09/20/19 0000  NA 144   < > 139   < > 135  --  146 145 141  K 3.6   < > 2.8*   < > 2.8*   < > 3.6 3.2* 3.7  CL 114*   < > 112*   < > 103  --   --  114* 110*  CO2 20*   < > 19*   < > 23   < > 26* 19 19  GLUCOSE 93  --  104*  --  104*  --   --   --   --   BUN 11   < > 7*   < > 6*  --  11 11 5   CREATININE 1.01*   < > 0.82   < > 0.77  --  0.8 0.8 0.8  CALCIUM 7.2*   < > 7.4*   < > 7.5*   <  > 8.2* 8.9 8.3*  MG 1.8  --  1.8  --  2.0  --   --   --   --    < > = values in this interval not displayed.   Recent Labs    06/21/19 0441 06/22/19 0505 06/23/19 0435  AST 123* 62* 38  ALT 132* 98* 74*  ALKPHOS 102 116 144*  BILITOT 2.9* 1.8* 1.5*  PROT 5.5* 5.5* 6.1*  ALBUMIN 2.9* 2.8* 3.0*   Recent Labs    06/19/19 2200 06/19/19 2200 06/20/19 0555 06/20/19 0555 06/20/19 0658 06/20/19 0658 06/21/19 0441 06/22/19 0505 06/23/19 0435  WBC 7.7   < > SPECIMEN CLOTTED   < > 14.4*   < > 9.3 8.4 7.0  NEUTROABS 7.0  --  SPECIMEN CLOTTED  --  12.9*  --   --   --   --   HGB 15.2*   < > SPECIMEN CLOTTED   < > 12.9   < > 11.6* 11.4* 12.3  HCT 48.4*   < > SPECIMEN CLOTTED   < > 42.3   < > 37.0 35.9* 38.6  MCV 96.2   < > SPECIMEN CLOTTED   < > 100.7*   < > 95.4 93.7 93.2  PLT 222   < > SPECIMEN CLOTTED   < > 210   < > 167 163 158   < > = values in this interval not displayed.   Lab Results  Component Value Date   TSH 2.36 06/09/2019   Lab Results  Component Value Date   HGBA1C 5.5 12/02/2016   Lab Results  Component Value Date   CHOL 174 10/21/2016   HDL 45 10/21/2016   LDLCALC 103 10/21/2016   TRIG 158 10/21/2016    Significant Diagnostic Results in last 30 days:  No results found.  Assessment/Plan Chronic embolism and thrombosis of unspecified vein Off anticoagulation, goal of care is comfort measures.   Essential hypertension Controlled blood pressure, off meds.   GERD without esophagitis Stable, off acid reducer   Slow transit constipation continue Prn Bisacodyl, adding Senokot   Seizure disorder (HCC) Stable, off Keppra.   Vascular dementia with behavior disturbance (HCC) FTT, comfort measures, Hospice service.   Pruritus Mostly in back, continue Triamcinolone topical cream, prn Hydroxyzine.   Adult failure to thrive Actually her weight has been stabilized, continue supportive care in memory care unit [facility]    Family/ staff Communication: Plan of  care reviewed with the patient and charge nurse.   Labs/tests ordered:  none  Time spend 25 minutes.

## 2019-11-02 NOTE — Assessment & Plan Note (Signed)
Off anticoagulation, goal of care is comfort measures.

## 2019-11-02 NOTE — Assessment & Plan Note (Signed)
Stable, off Keppra.

## 2019-11-02 NOTE — Assessment & Plan Note (Addendum)
continue Prn Bisacodyl, adding Senokot

## 2019-11-02 NOTE — Assessment & Plan Note (Signed)
Stable, off acid reducer 

## 2019-11-02 NOTE — Assessment & Plan Note (Signed)
Actually her weight has been stabilized, continue supportive care in memory care unit [facility]

## 2019-11-03 ENCOUNTER — Encounter: Payer: Self-pay | Admitting: Nurse Practitioner

## 2019-11-23 ENCOUNTER — Non-Acute Institutional Stay (SKILLED_NURSING_FACILITY): Payer: Medicare Other | Admitting: Nurse Practitioner

## 2019-11-23 ENCOUNTER — Encounter: Payer: Self-pay | Admitting: Nurse Practitioner

## 2019-11-23 ENCOUNTER — Other Ambulatory Visit: Payer: Self-pay | Admitting: *Deleted

## 2019-11-23 DIAGNOSIS — R627 Adult failure to thrive: Secondary | ICD-10-CM | POA: Diagnosis not present

## 2019-11-23 DIAGNOSIS — K5901 Slow transit constipation: Secondary | ICD-10-CM | POA: Diagnosis not present

## 2019-11-23 DIAGNOSIS — L299 Pruritus, unspecified: Secondary | ICD-10-CM

## 2019-11-23 DIAGNOSIS — F32A Depression, unspecified: Secondary | ICD-10-CM

## 2019-11-23 DIAGNOSIS — F419 Anxiety disorder, unspecified: Secondary | ICD-10-CM | POA: Diagnosis not present

## 2019-11-23 DIAGNOSIS — S22070A Wedge compression fracture of T9-T10 vertebra, initial encounter for closed fracture: Secondary | ICD-10-CM

## 2019-11-23 MED ORDER — LORAZEPAM 0.5 MG PO TABS: ORAL_TABLET | ORAL | 0 refills | Status: AC

## 2019-11-23 NOTE — Progress Notes (Signed)
Location:  [place] Room Number: 109-A Place of Service:  SNF (31) Provider:  Deavion Strider X, NP  Patient Care Team: Kaitlynd Phillips X, NP as PCP - General (Internal Medicine) Carrie Usery X, NP as Nurse Practitioner (Internal Medicine) Idolina Primer, Warnell Bureau as Consulting Physician (Optometry) Monna Fam, MD as Consulting Physician (Ophthalmology) Lucky Cowboy, Gordy Clement, NP as Nurse Practitioner (Hospice and Palliative Medicine) Virgie Dad, MD as Consulting Physician (Internal Medicine)  Extended Emergency Contact Information Primary Emergency Contact: [facility] Address: [address], [place] [address] [name] of [place] Phone: [phone] Work Phone: [phone] Mobile Phone: [phone] Relation: Daughter Secondary Emergency Contact: Jenne Campus Home Phone: [phone] Relation: Daughter  Code Status:  DNR Goals of care: Advanced Directive information Advanced Directives 11/23/2019  Does Patient Have a Medical Advance Directive? Yes  Type of Paramedic of [place];Living will;Out of facility DNR (pink MOST or yellow form)  Does patient want to make changes to medical advance directive? No - Patient declined  Copy of [place] in Chart? Yes - validated most recent copy scanned in chart (See row information)  Pre-existing out of facility DNR order (yellow form or pink MOST form) Yellow form placed in chart (order not valid for inpatient use)     Chief Complaint  Patient presents with  . Medical Management of Chronic Issues    Routine [facility] visit    HPI:  Pt is a 86 y.o. female seen today for medical management of chronic diseases.    FTT, supportive care in memory care unit [facility], under Hospice service, gradual weight loss  Constipation, takes Bisacodyl 10mg  prn, Senna 34ml qd  OA pain, in general, takes Morphine, Tylenol.   Anxiety, prn  Lorazepam is available to her.   Pruritus, mainly located in back, uses Triamcinolone cream, Hydroxyzine, Hydrocortisone cream.    Past Medical History:  Diagnosis Date  . Acute encephalopathy   . Aphasia following unspecified cerebrovascular disease   . 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 Depression   . Dysphagia following cerebrovascular disease   . Hepatic steatosis   . Hydroureteronephrosis   . Hypertension   . Insomnia   . Macular degeneration   . Major depressive disorder, recurrent ([place])   . Neuralgia and neuritis, unspecified (CODE)   . Nontraumatic intracranial hemorrhage ([place])   . Stroke ([place])   . 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   . 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 ABDOMINAL HYSTERECTOMY    . APPENDECTOMY    . BREAST SURGERY    . MASTECTOMY Right     Allergies  Allergen Reactions  . Hydrocodone Nausea And Vomiting  . Morphine And Related Nausea And Vomiting  . Actonel [Risedronate Sodium] Other (See Comments)    unknown  . Darvon [Propoxyphene Hcl] Other (See Comments)    unknown  . Oxycodone Hcl Other (See Comments)    unknown  . Pneumovax [Pneumococcal Polysaccharide Vaccine] Other (See Comments)    unknown    Outpatient Encounter Medications as of 11/23/2019  Medication Sig  . acetaminophen (TYLENOL) 325 MG tablet Take 650 mg by mouth every 4 (four) hours as needed for mild pain.   . bisacodyl (DULCOLAX) 10 MG suppository Place 10 mg rectally as  needed for moderate constipation.  . hydrocortisone 2.5 % lotion Apply 1 application topically 2 (two) times daily as needed. To Face  . hydrOXYzine (ATARAX/VISTARIL) 25 MG tablet Take 25 mg by mouth daily.  [place] Kitchen LORazepam (ATIVAN) 0.5 MG tablet Take one tablet by mouth daily, and take One tablet by mouth every 6 hours as needed for anxiety.  [place] Kitchen morphine (ROXANOL) 20 MG/ML  concentrated solution Take by mouth daily. 100 mg/5 mL (20 mg/mL); amt: 0.25 ml  . morphine (ROXANOL) 20 MG/ML concentrated solution Take by mouth every 6 (six) hours as needed for severe pain. 100 mg/5 mL (20 mg/mL); amt: 0.25 ml  . nystatin (MYCOSTATIN/NYSTOP) powder 1 application. Apply 1 application topically daily as needed under left breast. As Needed  . Sennosides (SENNA) 8.8 MG/5ML LIQD Take 10 mLs by mouth daily. Hold for diarrhea  . triamcinolone cream (KENALOG) 0.5 % Apply 1 application topically daily.   . [DISCONTINUED] LORazepam (ATIVAN) 0.5 MG tablet Take 0.5 mg by mouth daily.  . [DISCONTINUED] nystatin cream (MYCOSTATIN) 1 application. Apply 1 application prn TID. Mix with trimcinolone cream and apply to reddened area on back and buttocks with each incontinent episode and prn, then cover with zinc oxide barrier cream. As Needed   No facility-administered encounter medications on file as of 11/23/2019.    Review of Systems  Constitutional: Positive for fatigue and unexpected weight change. Negative for fever.       Lost #8Ibs in the past 3 weeks.   HENT: Positive for hearing loss and trouble swallowing. Negative for congestion.   Eyes: Negative for visual disturbance.  Respiratory: Positive for shortness of breath. Negative for cough.        O2 2lpm via [place]  Cardiovascular: Negative for leg swelling.  Gastrointestinal: Negative for abdominal pain and constipation.  Genitourinary: Negative for dysuria.       Incontinent of urine.   Musculoskeletal: Positive for arthralgias and back pain.       No longer ambulatory.   Skin: Positive for rash.       Itching back  Neurological: Positive for speech difficulty and weakness. Negative for seizures and headaches.       Dementia. Expressive aphasia. Right side weakness with muscle strength 5/5  Psychiatric/Behavioral: Positive for agitation and confusion. The patient is nervous/anxious.        Smiled, more alert.      Immunization History  Administered Date(s) Administered  . Influenza Whole 10/19/2017  . Influenza, High Dose Seasonal PF 10/08/2018  . Influenza-Unspecified 11/20/2013, 09/26/2014, 10/24/2015, 11/03/2016, 10/18/2019  . Moderna SARS-COVID-2 Vaccination 01/08/2019, 02/05/2019  . Tdap 09/19/2016  . Zoster 01/07/2012   Pertinent  Health Maintenance Due  Topic Date Due  . INFLUENZA VACCINE  Completed  . DEXA SCAN  Discontinued   Fall Risk  09/15/2017 09/05/2016  Falls in the past year? No No   Functional Status Survey:    Vitals:   11/23/19 1435  BP: 110/74  Pulse: 98  Resp: 14  Temp: (!) 97.5 F (36.4 C)  TempSrc: Oral  SpO2: 95%  Weight: 116 lb (52.6 kg)  Height: 5\' 3"  (1.6 m)   Body mass index is 20.55 kg/m. Physical Exam Vitals and nursing note reviewed.  HENT:     Head: Normocephalic and atraumatic.  Eyes:     Extraocular Movements: Extraocular movements intact.     Conjunctiva/sclera: Conjunctivae normal.     Pupils: Pupils are equal, round, and reactive to light.  Cardiovascular:  Rate and Rhythm: Normal rate and regular rhythm.     Heart sounds: No murmur heard.   Pulmonary:     Breath sounds: No rales.     Comments: O2 via [place] Abdominal:     General: Bowel sounds are normal.     Palpations: Abdomen is soft.     Tenderness: There is no abdominal tenderness.  Musculoskeletal:     Cervical back: Normal range of motion and neck supple.     Right lower leg: No edema.     Left lower leg: No edema.     Comments: Trace edema BLE  Skin:    General: Skin is warm and dry.     Findings: Erythema present.     Comments: Mild erythema in back  Neurological:     Mental Status: She is alert.     Comments: Not oriented to person, place, and times. Followed simple direction to open eyes, answered to yes or no question, smiled.   Psychiatric:        Mood and Affect: Mood normal.     Comments: Confused, but answered to yes or no questions, smiled, opened eyes  when spoke to      Labs reviewed: Recent Labs    06/21/19 0441 06/21/19 0441 06/22/19 0505 06/22/19 0505 06/23/19 0435 06/23/19 0435 06/28/19 0000 08/18/19 0000 09/20/19 0000  NA 144   < > 139   < > 135  --  146 145 141  K 3.6   < > 2.8*   < > 2.8*   < > 3.6 3.2* 3.7  CL 114*   < > 112*   < > 103  --   --  114* 110*  CO2 20*   < > 19*   < > 23   < > 26* 19 19  GLUCOSE 93  --  104*  --  104*  --   --   --   --   BUN 11   < > 7*   < > 6*  --  11 11 5   CREATININE 1.01*   < > 0.82   < > 0.77  --  0.8 0.8 0.8  CALCIUM 7.2*   < > 7.4*   < > 7.5*   < > 8.2* 8.9 8.3*  MG 1.8  --  1.8  --  2.0  --   --   --   --    < > = values in this interval not displayed.   Recent Labs    06/21/19 0441 06/22/19 0505 06/23/19 0435  AST 123* 62* 38  ALT 132* 98* 74*  ALKPHOS 102 116 144*  BILITOT 2.9* 1.8* 1.5*  PROT 5.5* 5.5* 6.1*  ALBUMIN 2.9* 2.8* 3.0*   Recent Labs    06/19/19 2200 06/19/19 2200 06/20/19 0555 06/20/19 0555 06/20/19 0658 06/20/19 0658 06/21/19 0441 06/22/19 0505 06/23/19 0435  WBC 7.7   < > SPECIMEN CLOTTED   < > 14.4*   < > 9.3 8.4 7.0  NEUTROABS 7.0  --  SPECIMEN CLOTTED  --  12.9*  --   --   --   --   HGB 15.2*   < > SPECIMEN CLOTTED   < > 12.9   < > 11.6* 11.4* 12.3  HCT 48.4*   < > SPECIMEN CLOTTED   < > 42.3   < > 37.0 35.9* 38.6  MCV 96.2   < > SPECIMEN CLOTTED   < > 100.7*   < >  95.4 93.7 93.2  PLT 222   < > SPECIMEN CLOTTED   < > 210   < > 167 163 158   < > = values in this interval not displayed.   Lab Results  Component Value Date   TSH 2.36 06/09/2019   Lab Results  Component Value Date   HGBA1C 5.5 12/02/2016   Lab Results  Component Value Date   CHOL 174 10/21/2016   HDL 45 10/21/2016   LDLCALC 103 10/21/2016   TRIG 158 10/21/2016    Significant Diagnostic Results in last 30 days:  No results found.  Assessment/Plan Adult failure to thrive FTT, supportive care in memory care unit [facility], under Hospice service, gradual weight  loss   Slow transit constipation Constipation, takes Bisacodyl 10mg  prn, Senna 16ml qd   Compression fracture of T9 vertebra (HCC) OA pain, in general, takes Morphine, Tylenol.    Anxiety and depression Anxiety, prn Lorazepam is available to her.   Pruritus Pruritus, mainly located in back, uses Triamcinolone cream, Hydroxyzine, Hydrocortisone cream.      Family/ staff Communication: plan of care reviewed with the patient and charge nurse.   Labs/tests ordered:  none  Time spend 25 minutes.

## 2019-11-23 NOTE — Assessment & Plan Note (Signed)
OA pain, in general, takes Morphine, Tylenol.

## 2019-11-23 NOTE — Assessment & Plan Note (Signed)
Constipation, takes Bisacodyl 10mg  prn, Senna 39ml qd

## 2019-11-23 NOTE — Assessment & Plan Note (Signed)
Pruritus, mainly located in back, uses Triamcinolone cream, Hydroxyzine, Hydrocortisone cream.

## 2019-11-23 NOTE — Assessment & Plan Note (Signed)
Anxiety, prn Lorazepam is available to her.

## 2019-11-23 NOTE — Telephone Encounter (Signed)
Received fax from [facility]. Pended Rx and sent to [facility] for approval.

## 2019-11-23 NOTE — Assessment & Plan Note (Signed)
FTT, supportive care in memory care unit [facility], under Hospice service, gradual weight loss

## 2019-12-07 DEATH — deceased

## 2021-03-22 IMAGING — US US ABDOMEN LIMITED
1 series · 14 of 25 positions shown · non-contrast
Comparison: None.

CLINICAL DATA: Transaminitis

EXAM:
ULTRASOUND ABDOMEN LIMITED RIGHT UPPER QUADRANT

[Series 1: us abdomen limited · 14 of 38 slices shown]
[im 1/38]
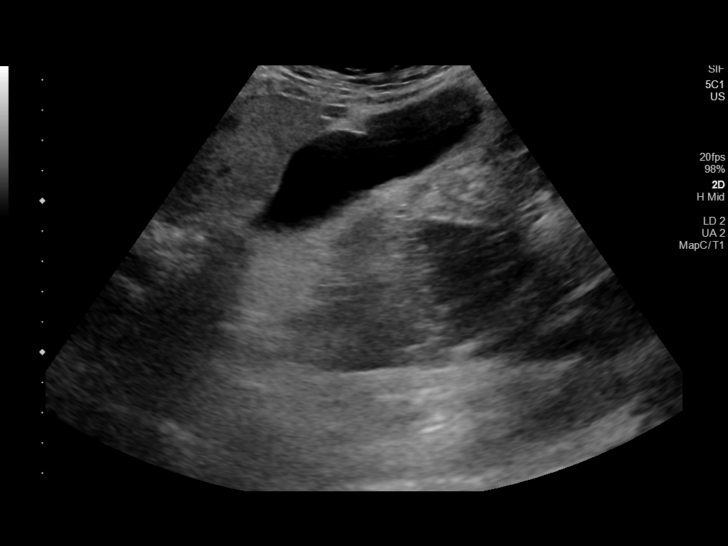
[im 4/38]
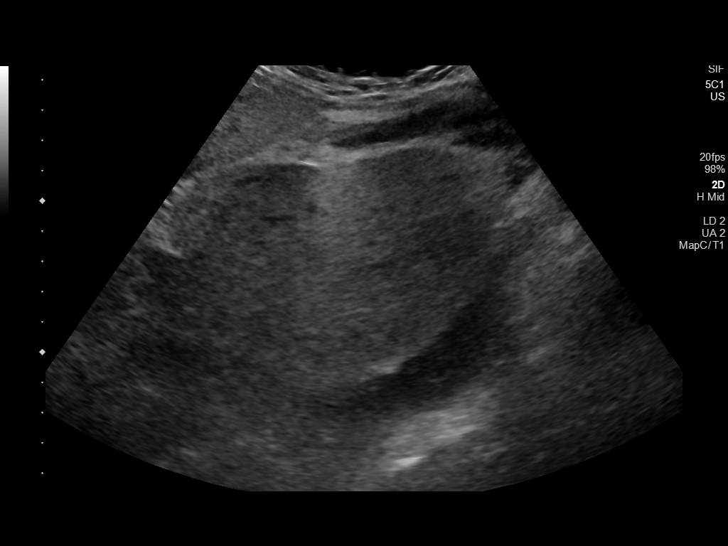
[im 7/38]
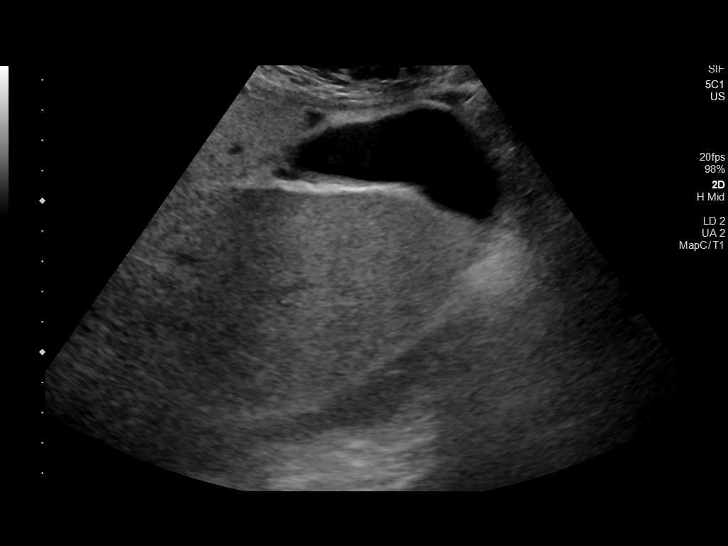
[im 10/38]
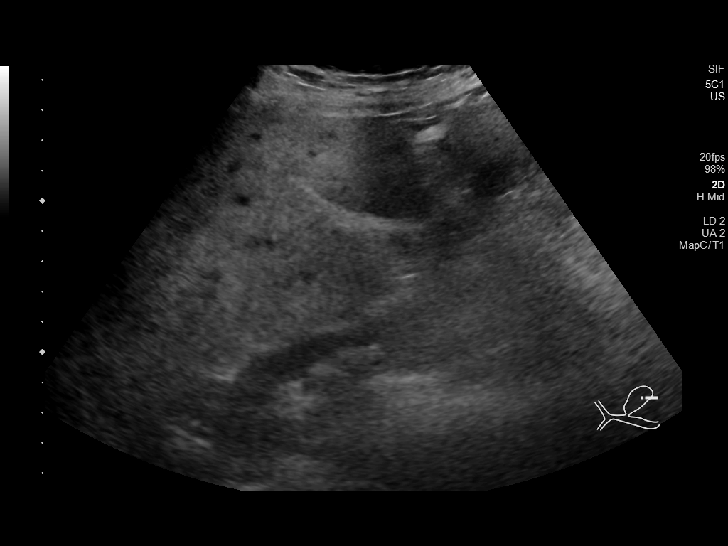
[im 13/38]
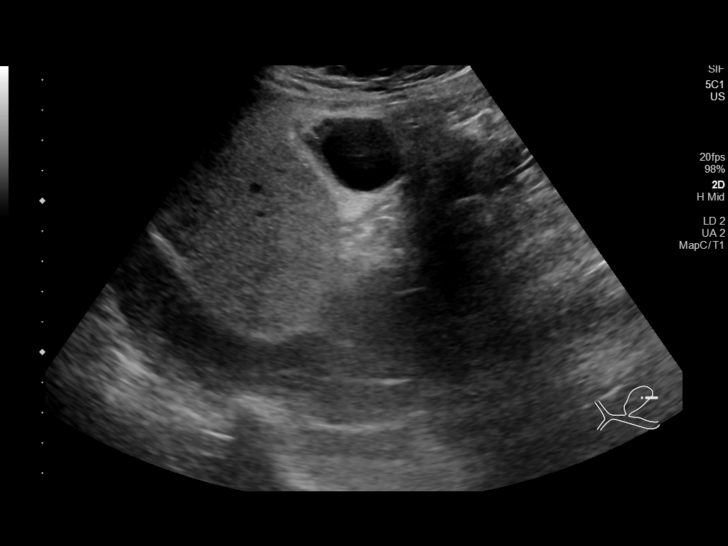
[im 14/38]
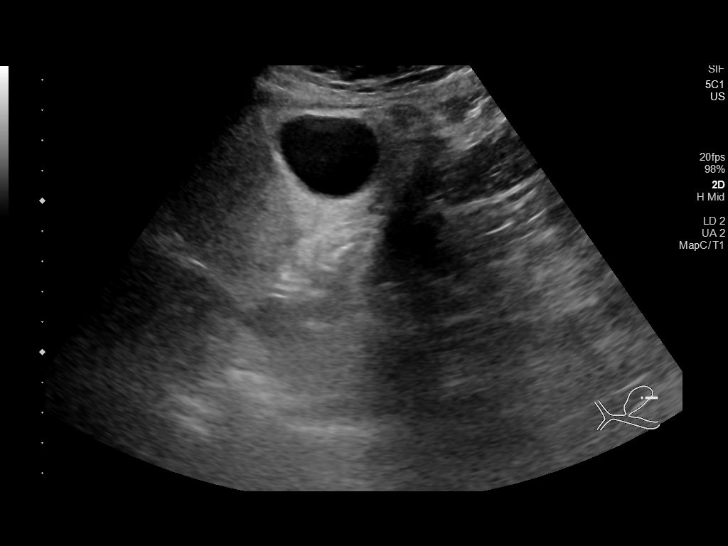
[im 17/38]
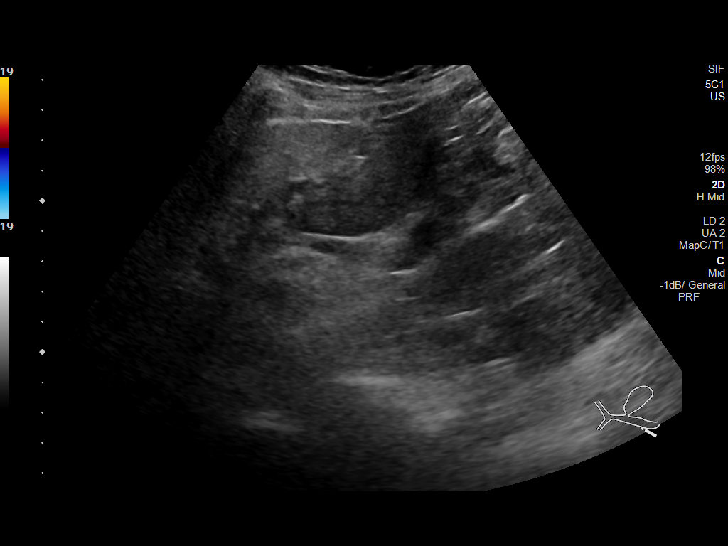
[im 21/38]
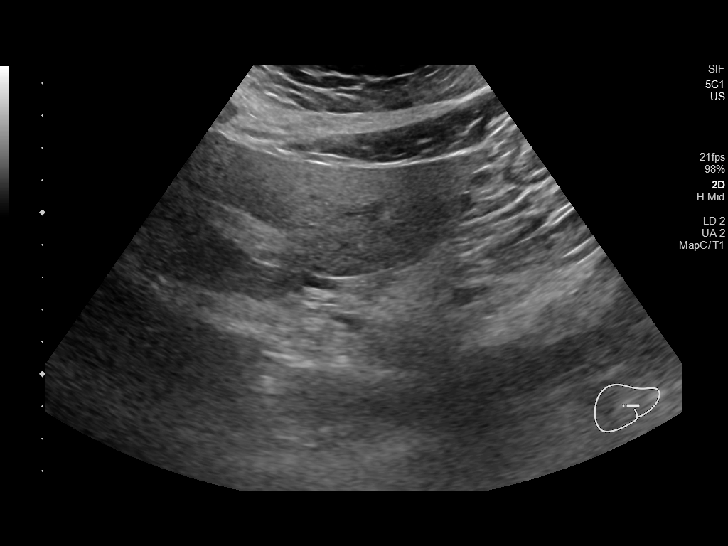
[im 24/38]
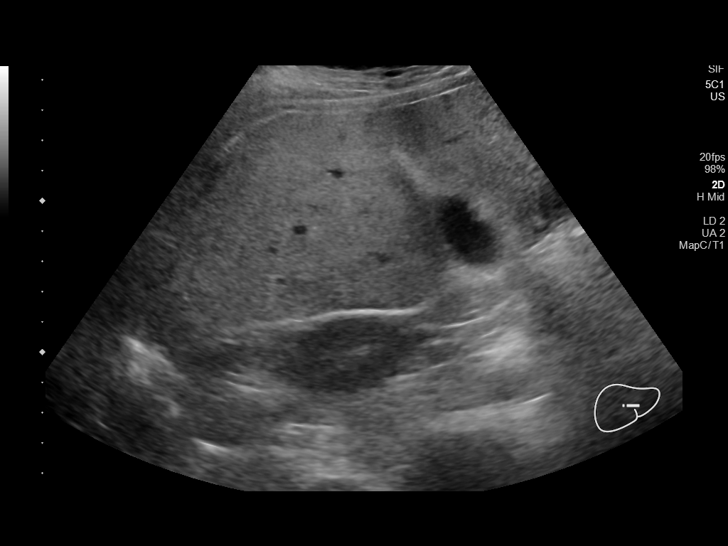
[im 25/38]
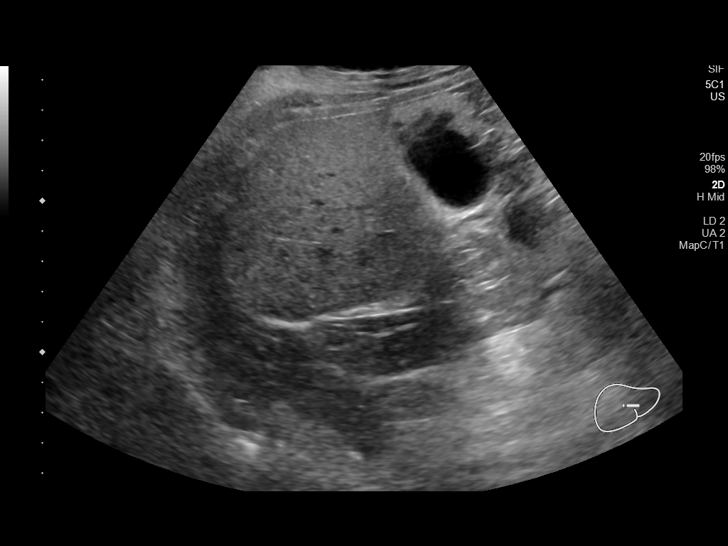
[im 28/38]
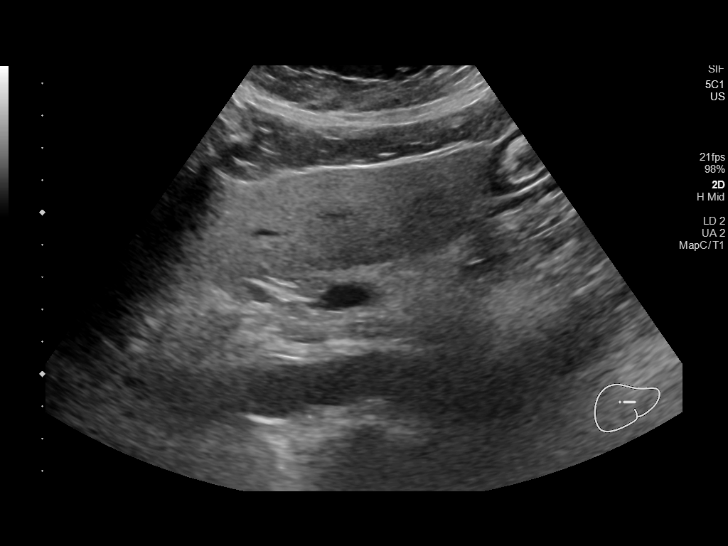
[im 31/38]
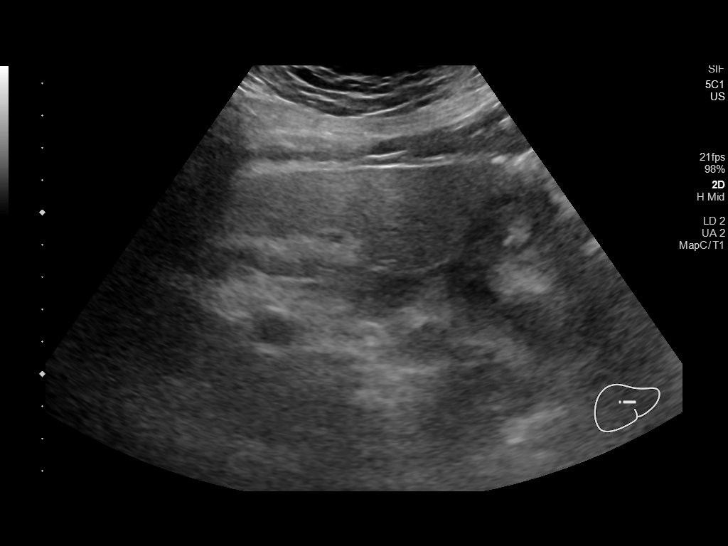
[im 34/38]
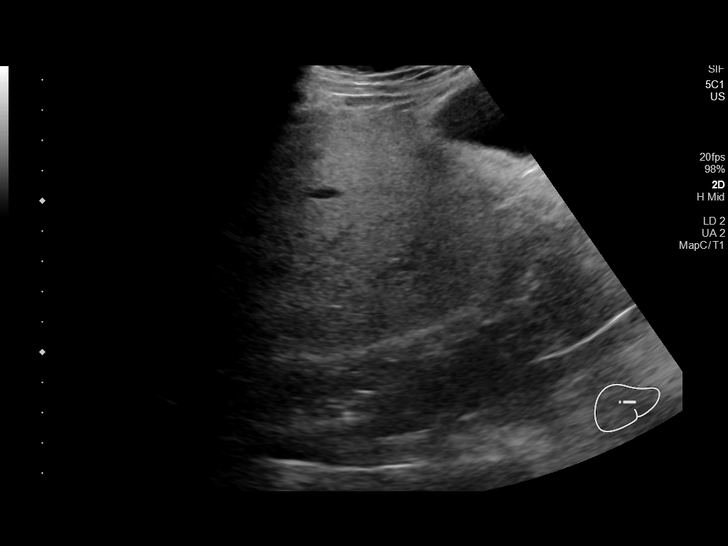
[im 38/38]
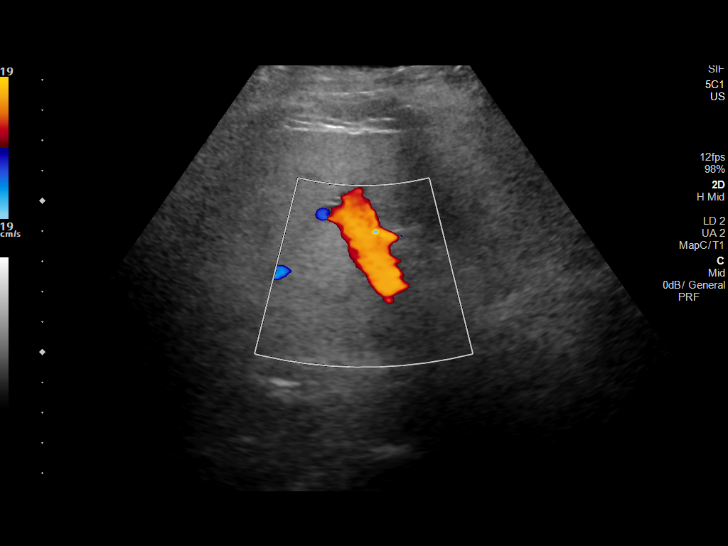

[14 of 25 positions shown; findings below may reference images not displayed]

FINDINGS: Gallbladder:

No gallstones or wall thickening visualized. No sonographic Murphy
sign noted by sonographer.

Common bile duct:

Diameter: Normal caliber, 6 mm.

Liver:

Increased echotexture compatible with fatty infiltration. No focal
abnormality or biliary ductal dilatation. Portal vein is patent on
color Doppler imaging with normal direction of blood flow towards
the liver.

Other: None.
IMPRESSION: Fatty infiltration of the liver.

No acute findings.

## 2021-03-22 IMAGING — CT CT ABD-PELV W/ CM
2 of 5 series · 15 of 46 positions shown, 17 images · IV contrast (omnipaque)
Comparison: July 13, 2016

CLINICAL DATA: Biliary colic.

EXAM:
CT ABDOMEN AND PELVIS WITH CONTRAST
TECHNIQUE: Multidetector CT imaging of the abdomen and pelvis was performed
using the standard protocol following bolus administration of
intravenous contrast.
CONTRAST:  80mL OMNIPAQUE IOHEXOL 300 MG/ML  SOLN

[Series 2: axial st · axial · 0.79mm/px · z∈[-453,-53]mm · 12 of 90 slices shown, 14 images]
[im 5/90  soft-tissue]
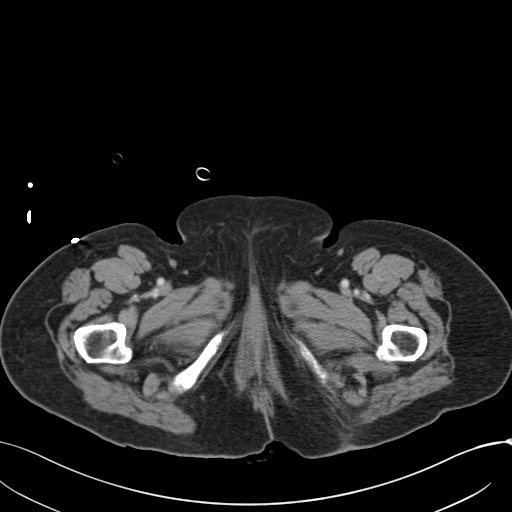
[im 5/90  bone]
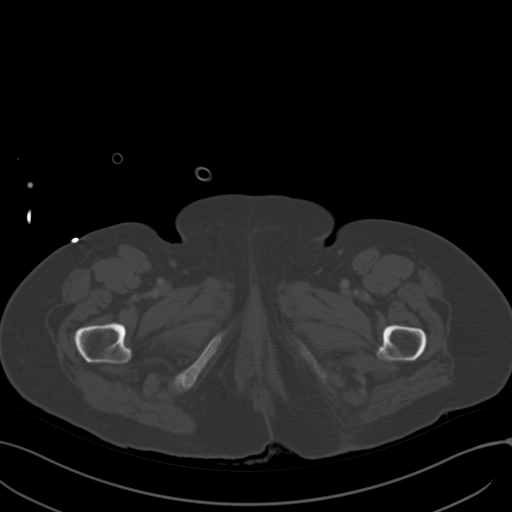
[im 15/90  soft-tissue]
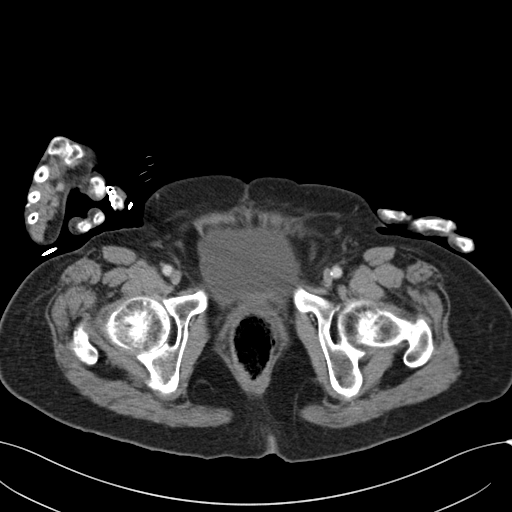
[im 20/90  soft-tissue]
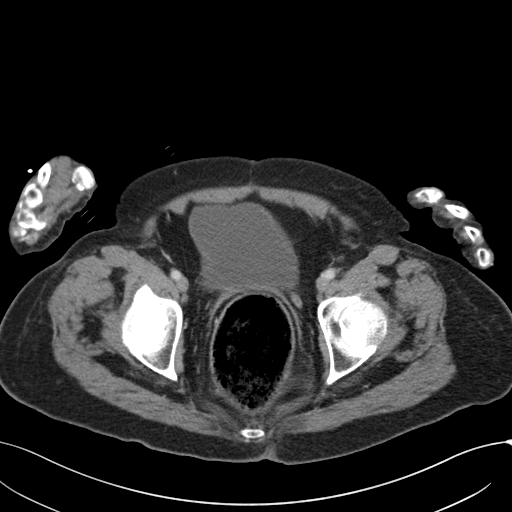
[im 25/90  soft-tissue]
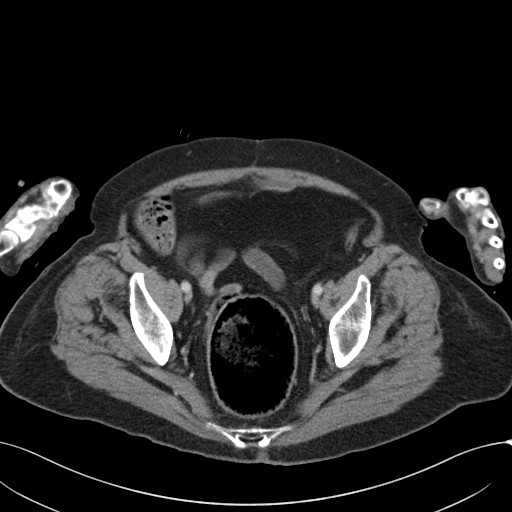
[im 35/90  soft-tissue]
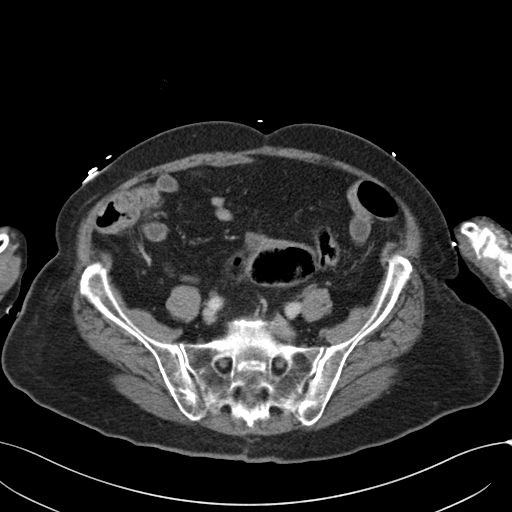
[im 40/90  soft-tissue]
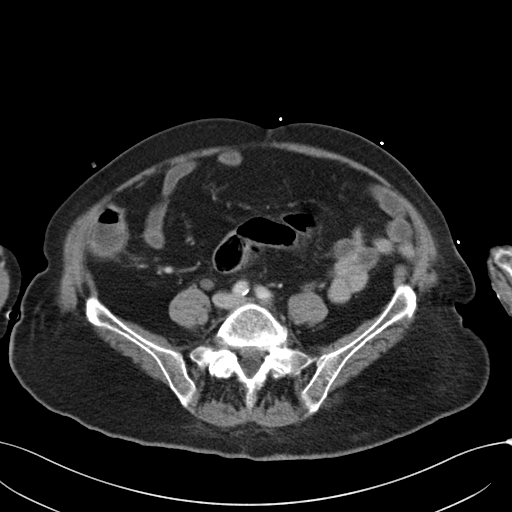
[im 50/90  soft-tissue]
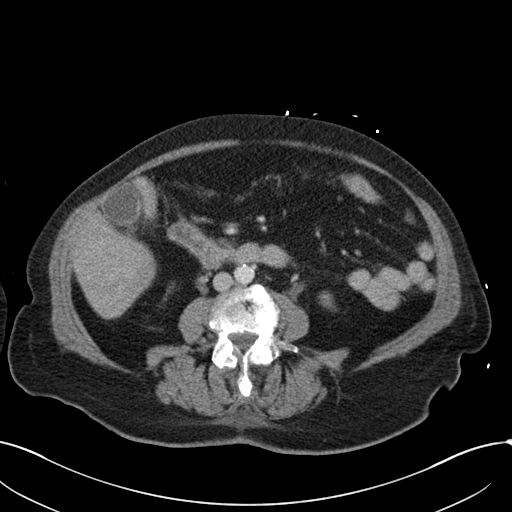
[im 55/90  soft-tissue]
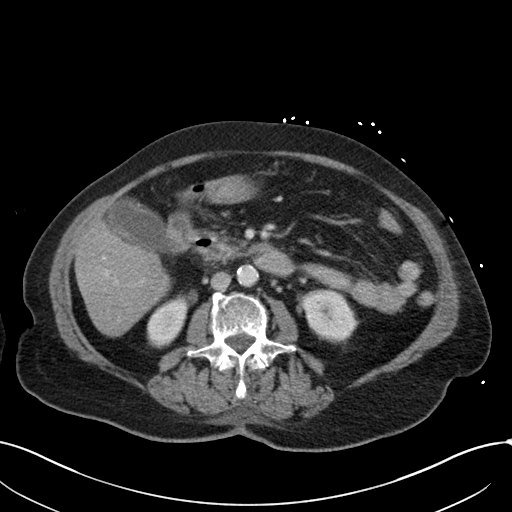
[im 65/90  soft-tissue]
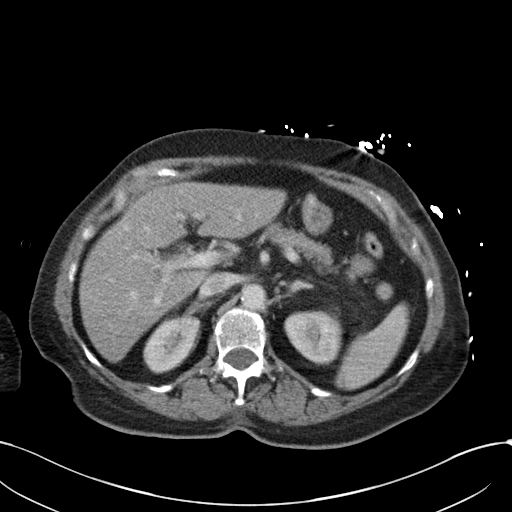
[im 65/90  bone]
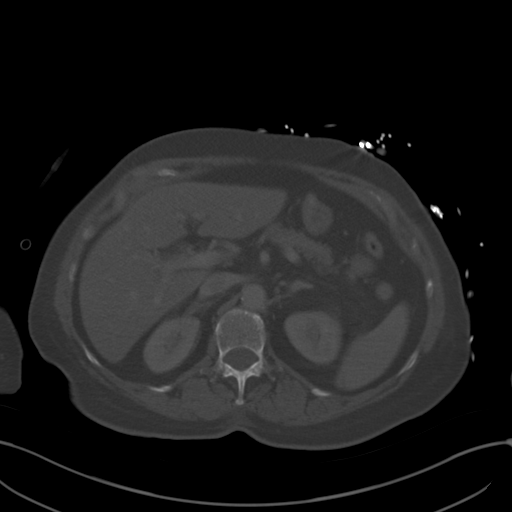
[im 70/90  soft-tissue]
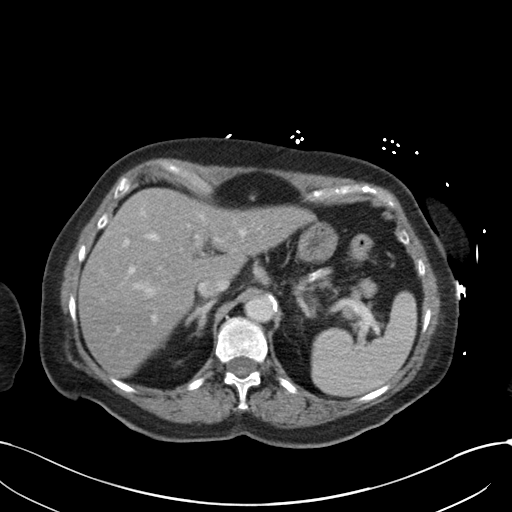
[im 75/90  soft-tissue]
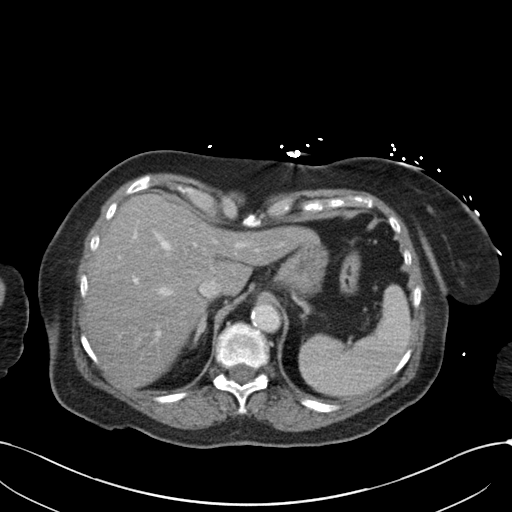
[im 85/90  soft-tissue]
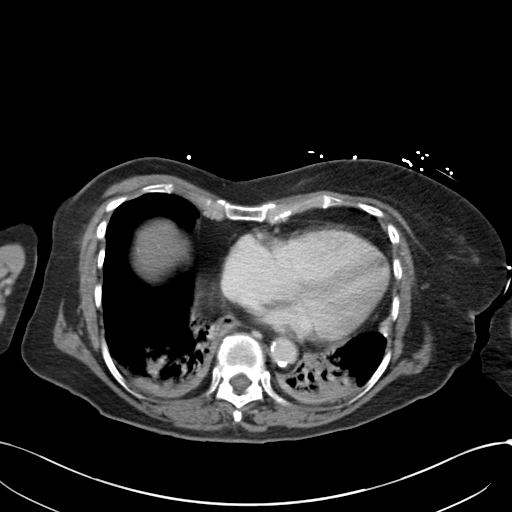

[Series 5: coronal st · coronal · 0.75mm/px · 3 of 153 slices shown]
[im 51/153  soft-tissue]
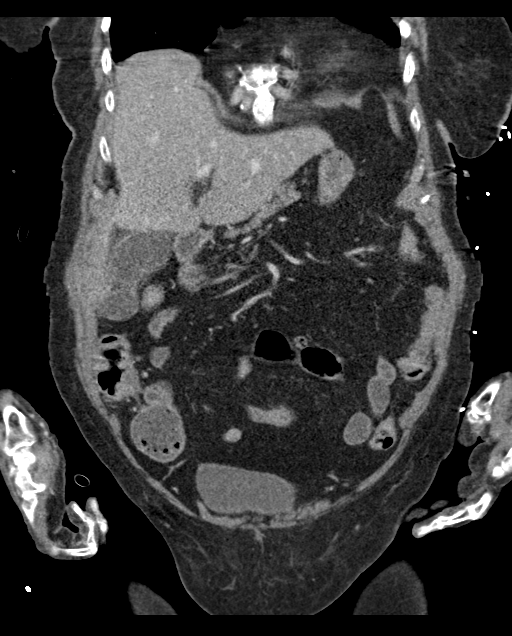
[im 68/153  soft-tissue]
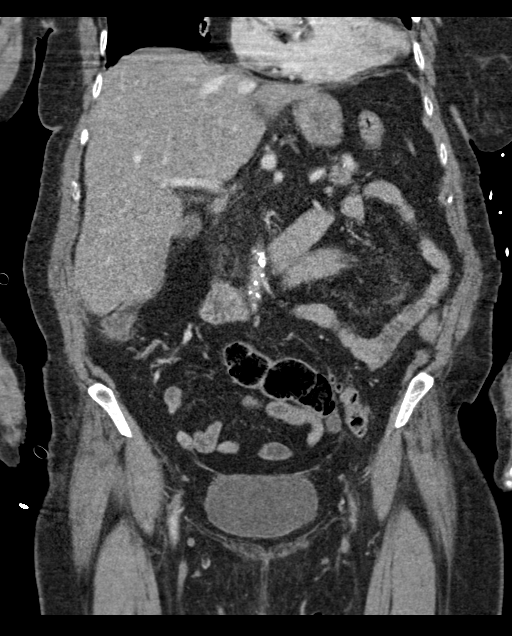
[im 85/153  soft-tissue]
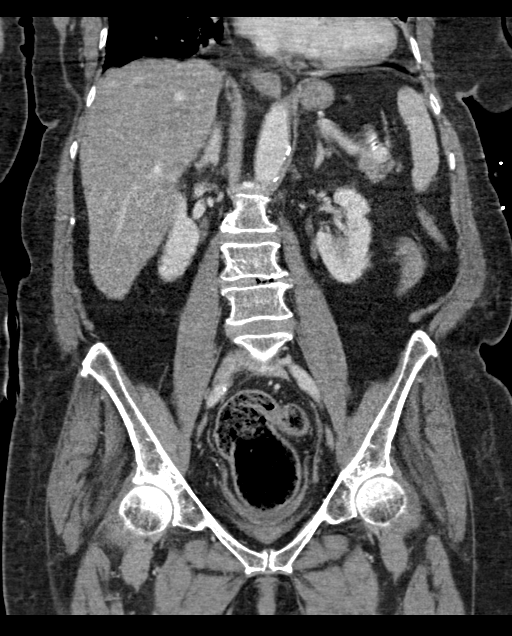

[15 of 46 positions shown; findings below may reference images not displayed]

FINDINGS: Lower chest: Significant bibasilar airspace disease is noted favored
to represent a combination of both atelectasis and consolidation,
especially at the right lung base.The heart is enlarged.

Hepatobiliary: There is decreased hepatic attenuation suggestive of
hepatic steatosis. There appears to be some gallbladder wall
thickening and adjacent inflammatory changes. There appears to be
pericholecystic free fluid.There is no biliary ductal dilation.

Pancreas: Normal contours without ductal dilatation. No
peripancreatic fluid collection.

Spleen: Unremarkable.

Adrenals/Urinary Tract:

--Adrenal glands: Unremarkable.

--Right kidney/ureter: No hydronephrosis or radiopaque kidney
stones.

--Left kidney/ureter: There is an at least partially duplicated
left-sided collecting system. There is a nonobstructing stone
measuring approximately 6 mm in the distal left ureter, nearly at
the left UVJ (axial series 2, image 73).

--Urinary bladder: Unremarkable.

Stomach/Bowel:

--Stomach/Duodenum: No hiatal hernia or other gastric abnormality.
Normal duodenal course and caliber.

--Small bowel: Unremarkable.

--Colon: There is a very large amount of stool the level the rectum.
There is scattered colonic diverticula without CT evidence for
diverticulitis.

--Appendix: Not visualized. No right lower quadrant inflammation or
free fluid.

Vascular/Lymphatic: Atherosclerotic calcification is present within
the non-aneurysmal abdominal aorta, without hemodynamically
significant stenosis.

--No retroperitoneal lymphadenopathy.

--No mesenteric lymphadenopathy.

--No pelvic or inguinal lymphadenopathy.

Reproductive: Status post hysterectomy. No adnexal mass.

Other: No ascites or free air. The abdominal wall is normal.

Musculoskeletal. Again noted is a compression fracture of the T9
vertebral body. There has been significant interval height loss
since the patient's prior study dated 07/25/2018. There is no new
acute appearing compression fracture.
IMPRESSION: 1. Findings are concerning for acute cholecystitis. As this is
discordant with the patient's recent ultrasound, correlation with
laboratory studies and HIDA scan is recommended.
2. There is a large amount of stool the level of the rectum.
3. There is a nonobstructing stone measuring approximately 6 mm in
the distal left ureter, nearly at the left UVJ.
4. Significant bibasilar airspace disease favored to represent a
combination of both atelectasis and consolidation, especially at the
right lung base.
5. Hepatic steatosis.
6. Cardiomegaly.
7. Again noted is a T9 compression fracture with significant
interval height loss since 0303.

Aortic Atherosclerosis ([id number]-XX3.3).
